# Patient Record
Sex: Female | Born: 1935 | Race: White | Hispanic: No | Smoking: Never smoker
Health system: Southern US, Community
[De-identification: ages and names within clinical notes are randomized; demographics above are authoritative.]

## PROBLEM LIST (undated history)

## (undated) DIAGNOSIS — G43909 Migraine, unspecified, not intractable, without status migrainosus: Secondary | ICD-10-CM

## (undated) DIAGNOSIS — I10 Essential (primary) hypertension: Secondary | ICD-10-CM

## (undated) DIAGNOSIS — J449 Chronic obstructive pulmonary disease, unspecified: Secondary | ICD-10-CM

## (undated) DIAGNOSIS — R296 Repeated falls: Secondary | ICD-10-CM

## (undated) DIAGNOSIS — C801 Malignant (primary) neoplasm, unspecified: Secondary | ICD-10-CM

## (undated) DIAGNOSIS — K5790 Diverticulosis of intestine, part unspecified, without perforation or abscess without bleeding: Secondary | ICD-10-CM

## (undated) DIAGNOSIS — Z8673 Personal history of transient ischemic attack (TIA), and cerebral infarction without residual deficits: Secondary | ICD-10-CM

## (undated) DIAGNOSIS — R32 Unspecified urinary incontinence: Secondary | ICD-10-CM

## (undated) DIAGNOSIS — Z8619 Personal history of other infectious and parasitic diseases: Secondary | ICD-10-CM

## (undated) DIAGNOSIS — M1990 Unspecified osteoarthritis, unspecified site: Secondary | ICD-10-CM

## (undated) DIAGNOSIS — K219 Gastro-esophageal reflux disease without esophagitis: Secondary | ICD-10-CM

## (undated) DIAGNOSIS — K635 Polyp of colon: Secondary | ICD-10-CM

## (undated) DIAGNOSIS — E039 Hypothyroidism, unspecified: Secondary | ICD-10-CM

## (undated) DIAGNOSIS — H409 Unspecified glaucoma: Secondary | ICD-10-CM

## (undated) DIAGNOSIS — G629 Polyneuropathy, unspecified: Secondary | ICD-10-CM

## (undated) DIAGNOSIS — E7800 Pure hypercholesterolemia, unspecified: Secondary | ICD-10-CM

## (undated) HISTORY — DX: Migraine, unspecified, not intractable, without status migrainosus: G43.909

## (undated) HISTORY — DX: Essential (primary) hypertension: I10

## (undated) HISTORY — DX: Hypothyroidism, unspecified: E03.9

## (undated) HISTORY — DX: Diverticulosis of intestine, part unspecified, without perforation or abscess without bleeding: K57.90

## (undated) HISTORY — DX: Chronic obstructive pulmonary disease, unspecified: J44.9

## (undated) HISTORY — PX: APPENDECTOMY: SHX54

## (undated) HISTORY — DX: Unspecified urinary incontinence: R32

## (undated) HISTORY — DX: Polyp of colon: K63.5

## (undated) HISTORY — DX: Pure hypercholesterolemia, unspecified: E78.00

## (undated) HISTORY — DX: Personal history of other infectious and parasitic diseases: Z86.19

## (undated) HISTORY — DX: Gastro-esophageal reflux disease without esophagitis: K21.9

## (undated) HISTORY — DX: Unspecified osteoarthritis, unspecified site: M19.90

## (undated) HISTORY — DX: Unspecified glaucoma: H40.9

## (undated) HISTORY — DX: Polyneuropathy, unspecified: G62.9

## (undated) HISTORY — PX: CHOLECYSTECTOMY: SHX55

## (undated) HISTORY — PX: TONSILLECTOMY: SUR1361

---

## 1959-12-29 HISTORY — PX: ABDOMINAL HYSTERECTOMY: SHX81

## 1974-12-28 HISTORY — PX: OTHER SURGICAL HISTORY: SHX169

## 1995-12-29 HISTORY — PX: CATARACT EXTRACTION: SUR2

## 2005-01-19 ENCOUNTER — Ambulatory Visit: Payer: Self-pay | Admitting: Internal Medicine

## 2005-06-16 ENCOUNTER — Ambulatory Visit: Payer: Self-pay | Admitting: Internal Medicine

## 2006-07-27 ENCOUNTER — Ambulatory Visit: Payer: Self-pay | Admitting: Internal Medicine

## 2007-06-01 ENCOUNTER — Ambulatory Visit: Payer: Self-pay | Admitting: Gastroenterology

## 2007-07-04 ENCOUNTER — Ambulatory Visit: Payer: Self-pay | Admitting: Internal Medicine

## 2008-04-24 ENCOUNTER — Ambulatory Visit: Payer: Self-pay | Admitting: Internal Medicine

## 2008-07-05 ENCOUNTER — Ambulatory Visit: Payer: Self-pay | Admitting: Internal Medicine

## 2009-02-28 ENCOUNTER — Ambulatory Visit: Payer: Self-pay | Admitting: Gastroenterology

## 2009-07-10 ENCOUNTER — Ambulatory Visit: Payer: Self-pay | Admitting: Internal Medicine

## 2009-11-05 ENCOUNTER — Ambulatory Visit: Payer: Self-pay | Admitting: Internal Medicine

## 2009-12-23 ENCOUNTER — Ambulatory Visit: Payer: Self-pay | Admitting: Surgery

## 2009-12-26 ENCOUNTER — Ambulatory Visit: Payer: Self-pay | Admitting: Surgery

## 2009-12-28 HISTORY — PX: INTERSTIM IMPLANT PLACEMENT: SHX5130

## 2010-03-11 ENCOUNTER — Ambulatory Visit: Payer: Self-pay | Admitting: Urology

## 2010-05-10 ENCOUNTER — Ambulatory Visit: Payer: Self-pay | Admitting: Internal Medicine

## 2010-05-27 ENCOUNTER — Ambulatory Visit: Payer: Self-pay | Admitting: Urology

## 2010-06-05 ENCOUNTER — Ambulatory Visit: Payer: Self-pay | Admitting: Gastroenterology

## 2010-06-10 ENCOUNTER — Ambulatory Visit: Payer: Self-pay | Admitting: Urology

## 2010-06-30 ENCOUNTER — Ambulatory Visit: Payer: Self-pay | Admitting: Internal Medicine

## 2010-07-15 ENCOUNTER — Ambulatory Visit: Payer: Self-pay | Admitting: Urology

## 2010-08-27 ENCOUNTER — Ambulatory Visit: Payer: Self-pay | Admitting: Internal Medicine

## 2011-01-07 ENCOUNTER — Ambulatory Visit: Payer: Self-pay | Admitting: Internal Medicine

## 2011-03-05 ENCOUNTER — Ambulatory Visit: Payer: Self-pay | Admitting: Internal Medicine

## 2011-06-22 ENCOUNTER — Ambulatory Visit: Payer: Self-pay | Admitting: Obstetrics and Gynecology

## 2011-09-03 ENCOUNTER — Ambulatory Visit: Payer: Self-pay | Admitting: Internal Medicine

## 2011-10-21 ENCOUNTER — Ambulatory Visit: Payer: Self-pay | Admitting: Family Medicine

## 2012-01-06 DIAGNOSIS — Z79899 Other long term (current) drug therapy: Secondary | ICD-10-CM | POA: Diagnosis not present

## 2012-01-06 DIAGNOSIS — I10 Essential (primary) hypertension: Secondary | ICD-10-CM | POA: Diagnosis not present

## 2012-01-06 DIAGNOSIS — E039 Hypothyroidism, unspecified: Secondary | ICD-10-CM | POA: Diagnosis not present

## 2012-01-06 DIAGNOSIS — E7800 Pure hypercholesterolemia, unspecified: Secondary | ICD-10-CM | POA: Diagnosis not present

## 2012-01-11 DIAGNOSIS — R7989 Other specified abnormal findings of blood chemistry: Secondary | ICD-10-CM | POA: Diagnosis not present

## 2012-01-20 DIAGNOSIS — N312 Flaccid neuropathic bladder, not elsewhere classified: Secondary | ICD-10-CM | POA: Diagnosis not present

## 2012-01-20 DIAGNOSIS — N3946 Mixed incontinence: Secondary | ICD-10-CM | POA: Diagnosis not present

## 2012-01-20 DIAGNOSIS — N318 Other neuromuscular dysfunction of bladder: Secondary | ICD-10-CM | POA: Diagnosis not present

## 2012-01-20 DIAGNOSIS — R339 Retention of urine, unspecified: Secondary | ICD-10-CM | POA: Diagnosis not present

## 2012-01-22 DIAGNOSIS — I10 Essential (primary) hypertension: Secondary | ICD-10-CM | POA: Diagnosis not present

## 2012-01-22 DIAGNOSIS — R7309 Other abnormal glucose: Secondary | ICD-10-CM | POA: Diagnosis not present

## 2012-01-22 DIAGNOSIS — R5381 Other malaise: Secondary | ICD-10-CM | POA: Diagnosis not present

## 2012-01-22 DIAGNOSIS — E7800 Pure hypercholesterolemia, unspecified: Secondary | ICD-10-CM | POA: Diagnosis not present

## 2012-02-18 DIAGNOSIS — E039 Hypothyroidism, unspecified: Secondary | ICD-10-CM | POA: Diagnosis not present

## 2012-03-04 DIAGNOSIS — R5381 Other malaise: Secondary | ICD-10-CM | POA: Diagnosis not present

## 2012-03-04 DIAGNOSIS — R5383 Other fatigue: Secondary | ICD-10-CM | POA: Diagnosis not present

## 2012-03-17 DIAGNOSIS — E7800 Pure hypercholesterolemia, unspecified: Secondary | ICD-10-CM | POA: Diagnosis not present

## 2012-03-17 DIAGNOSIS — R5381 Other malaise: Secondary | ICD-10-CM | POA: Diagnosis not present

## 2012-03-17 DIAGNOSIS — I10 Essential (primary) hypertension: Secondary | ICD-10-CM | POA: Diagnosis not present

## 2012-03-17 DIAGNOSIS — K219 Gastro-esophageal reflux disease without esophagitis: Secondary | ICD-10-CM | POA: Diagnosis not present

## 2012-05-10 DIAGNOSIS — R7989 Other specified abnormal findings of blood chemistry: Secondary | ICD-10-CM | POA: Diagnosis not present

## 2012-05-10 DIAGNOSIS — I10 Essential (primary) hypertension: Secondary | ICD-10-CM | POA: Diagnosis not present

## 2012-05-10 DIAGNOSIS — E7800 Pure hypercholesterolemia, unspecified: Secondary | ICD-10-CM | POA: Diagnosis not present

## 2012-05-11 DIAGNOSIS — H4010X0 Unspecified open-angle glaucoma, stage unspecified: Secondary | ICD-10-CM | POA: Diagnosis not present

## 2012-05-16 DIAGNOSIS — J301 Allergic rhinitis due to pollen: Secondary | ICD-10-CM | POA: Diagnosis not present

## 2012-05-16 DIAGNOSIS — H903 Sensorineural hearing loss, bilateral: Secondary | ICD-10-CM | POA: Diagnosis not present

## 2012-05-16 DIAGNOSIS — R42 Dizziness and giddiness: Secondary | ICD-10-CM | POA: Diagnosis not present

## 2012-05-16 DIAGNOSIS — H6120 Impacted cerumen, unspecified ear: Secondary | ICD-10-CM | POA: Diagnosis not present

## 2012-05-25 DIAGNOSIS — N289 Disorder of kidney and ureter, unspecified: Secondary | ICD-10-CM | POA: Diagnosis not present

## 2012-06-02 DIAGNOSIS — M76899 Other specified enthesopathies of unspecified lower limb, excluding foot: Secondary | ICD-10-CM | POA: Diagnosis not present

## 2012-06-24 DIAGNOSIS — I10 Essential (primary) hypertension: Secondary | ICD-10-CM | POA: Diagnosis not present

## 2012-06-24 DIAGNOSIS — S20229A Contusion of unspecified back wall of thorax, initial encounter: Secondary | ICD-10-CM | POA: Diagnosis not present

## 2012-06-27 DIAGNOSIS — Z8742 Personal history of other diseases of the female genital tract: Secondary | ICD-10-CM | POA: Diagnosis not present

## 2012-07-13 DIAGNOSIS — N839 Noninflammatory disorder of ovary, fallopian tube and broad ligament, unspecified: Secondary | ICD-10-CM | POA: Diagnosis not present

## 2012-07-13 DIAGNOSIS — K219 Gastro-esophageal reflux disease without esophagitis: Secondary | ICD-10-CM | POA: Diagnosis not present

## 2012-07-13 DIAGNOSIS — E7800 Pure hypercholesterolemia, unspecified: Secondary | ICD-10-CM | POA: Diagnosis not present

## 2012-07-13 DIAGNOSIS — M79609 Pain in unspecified limb: Secondary | ICD-10-CM | POA: Diagnosis not present

## 2012-07-13 DIAGNOSIS — G609 Hereditary and idiopathic neuropathy, unspecified: Secondary | ICD-10-CM | POA: Diagnosis not present

## 2012-07-13 DIAGNOSIS — Z8742 Personal history of other diseases of the female genital tract: Secondary | ICD-10-CM | POA: Diagnosis not present

## 2012-07-13 DIAGNOSIS — N952 Postmenopausal atrophic vaginitis: Secondary | ICD-10-CM | POA: Diagnosis not present

## 2012-07-18 DIAGNOSIS — N312 Flaccid neuropathic bladder, not elsewhere classified: Secondary | ICD-10-CM | POA: Diagnosis not present

## 2012-07-18 DIAGNOSIS — G609 Hereditary and idiopathic neuropathy, unspecified: Secondary | ICD-10-CM | POA: Diagnosis not present

## 2012-07-18 DIAGNOSIS — R339 Retention of urine, unspecified: Secondary | ICD-10-CM | POA: Diagnosis not present

## 2012-07-18 DIAGNOSIS — N318 Other neuromuscular dysfunction of bladder: Secondary | ICD-10-CM | POA: Diagnosis not present

## 2012-09-14 DIAGNOSIS — L819 Disorder of pigmentation, unspecified: Secondary | ICD-10-CM | POA: Diagnosis not present

## 2012-09-14 DIAGNOSIS — L821 Other seborrheic keratosis: Secondary | ICD-10-CM | POA: Diagnosis not present

## 2012-09-14 DIAGNOSIS — D485 Neoplasm of uncertain behavior of skin: Secondary | ICD-10-CM | POA: Diagnosis not present

## 2012-09-14 DIAGNOSIS — D2330 Other benign neoplasm of skin of unspecified part of face: Secondary | ICD-10-CM | POA: Diagnosis not present

## 2012-09-14 DIAGNOSIS — Z23 Encounter for immunization: Secondary | ICD-10-CM | POA: Diagnosis not present

## 2012-09-14 DIAGNOSIS — L570 Actinic keratosis: Secondary | ICD-10-CM | POA: Diagnosis not present

## 2012-10-12 DIAGNOSIS — M545 Low back pain, unspecified: Secondary | ICD-10-CM | POA: Diagnosis not present

## 2012-10-12 DIAGNOSIS — M76899 Other specified enthesopathies of unspecified lower limb, excluding foot: Secondary | ICD-10-CM | POA: Diagnosis not present

## 2012-10-17 DIAGNOSIS — L570 Actinic keratosis: Secondary | ICD-10-CM | POA: Diagnosis not present

## 2012-11-07 DIAGNOSIS — H4010X0 Unspecified open-angle glaucoma, stage unspecified: Secondary | ICD-10-CM | POA: Diagnosis not present

## 2012-11-15 ENCOUNTER — Encounter: Payer: Self-pay | Admitting: Internal Medicine

## 2012-11-15 ENCOUNTER — Ambulatory Visit (INDEPENDENT_AMBULATORY_CARE_PROVIDER_SITE_OTHER): Payer: Medicare Other | Admitting: Internal Medicine

## 2012-11-15 VITALS — BP 160/98 | HR 83 | Temp 98.5°F | Ht 62.0 in | Wt 174.5 lb

## 2012-11-15 DIAGNOSIS — R739 Hyperglycemia, unspecified: Secondary | ICD-10-CM

## 2012-11-15 DIAGNOSIS — G589 Mononeuropathy, unspecified: Secondary | ICD-10-CM

## 2012-11-15 DIAGNOSIS — N83209 Unspecified ovarian cyst, unspecified side: Secondary | ICD-10-CM

## 2012-11-15 DIAGNOSIS — Z139 Encounter for screening, unspecified: Secondary | ICD-10-CM

## 2012-11-15 DIAGNOSIS — F329 Major depressive disorder, single episode, unspecified: Secondary | ICD-10-CM

## 2012-11-15 DIAGNOSIS — F32A Depression, unspecified: Secondary | ICD-10-CM

## 2012-11-15 DIAGNOSIS — E039 Hypothyroidism, unspecified: Secondary | ICD-10-CM | POA: Diagnosis not present

## 2012-11-15 DIAGNOSIS — R7309 Other abnormal glucose: Secondary | ICD-10-CM

## 2012-11-15 DIAGNOSIS — E1169 Type 2 diabetes mellitus with other specified complication: Secondary | ICD-10-CM | POA: Insufficient documentation

## 2012-11-15 DIAGNOSIS — D649 Anemia, unspecified: Secondary | ICD-10-CM

## 2012-11-15 DIAGNOSIS — E119 Type 2 diabetes mellitus without complications: Secondary | ICD-10-CM | POA: Insufficient documentation

## 2012-11-15 DIAGNOSIS — R32 Unspecified urinary incontinence: Secondary | ICD-10-CM

## 2012-11-15 DIAGNOSIS — K219 Gastro-esophageal reflux disease without esophagitis: Secondary | ICD-10-CM

## 2012-11-15 DIAGNOSIS — I10 Essential (primary) hypertension: Secondary | ICD-10-CM

## 2012-11-15 DIAGNOSIS — G629 Polyneuropathy, unspecified: Secondary | ICD-10-CM

## 2012-11-15 DIAGNOSIS — E7800 Pure hypercholesterolemia, unspecified: Secondary | ICD-10-CM | POA: Diagnosis not present

## 2012-11-15 MED ORDER — IRBESARTAN 150 MG PO TABS: 150.0000 mg | ORAL_TABLET | Freq: Every day | ORAL | Status: DC

## 2012-11-15 MED ORDER — COLESEVELAM HCL 625 MG PO TABS: ORAL_TABLET | ORAL | Status: DC

## 2012-11-15 MED ORDER — MEMANTINE HCL 10 MG PO TABS: 10.0000 mg | ORAL_TABLET | Freq: Two times a day (BID) | ORAL | Status: DC

## 2012-11-15 NOTE — Patient Instructions (Addendum)
It was nice seeing you today.  I am glad your bladder is doing better.  Let us know if you need anything.

## 2012-11-27 ENCOUNTER — Encounter: Payer: Self-pay | Admitting: Internal Medicine

## 2012-11-27 DIAGNOSIS — K219 Gastro-esophageal reflux disease without esophagitis: Secondary | ICD-10-CM | POA: Insufficient documentation

## 2012-11-27 DIAGNOSIS — G629 Polyneuropathy, unspecified: Secondary | ICD-10-CM | POA: Insufficient documentation

## 2012-11-27 DIAGNOSIS — R32 Unspecified urinary incontinence: Secondary | ICD-10-CM | POA: Insufficient documentation

## 2012-11-27 DIAGNOSIS — F329 Major depressive disorder, single episode, unspecified: Secondary | ICD-10-CM | POA: Insufficient documentation

## 2012-11-27 DIAGNOSIS — F32A Depression, unspecified: Secondary | ICD-10-CM | POA: Insufficient documentation

## 2012-11-27 DIAGNOSIS — N83209 Unspecified ovarian cyst, unspecified side: Secondary | ICD-10-CM | POA: Insufficient documentation

## 2012-11-27 NOTE — Assessment & Plan Note (Signed)
Low carb diet.  Check met b and a1c.    

## 2012-11-27 NOTE — Progress Notes (Signed)
  Subjective:    Patient ID: Sandra Kaufman, female    DOB: Dec 28, 1932, 78 y.o.   MRN: [id number]  HPI 78 year old female with past history of hypertension, COPD, GERD and hypercholesterolemia who comes in today for a scheduled follow up.  She is taking myrbetriq and states it has really helped her bladder symptoms.  Seeing Dr Achilles Dunk.  Breathing stable.  No chest pain or tightness.  Eating and drinking well.  Saw Marian Sorrow recently.  Had an injection in her hip four weeks ago.  Taking some alleve.  Bowels stable.    Past Medical History  Diagnosis Date  . Hypertension   . Hypercholesterolemia   . Hypothyroidism   . COPD (chronic obstructive pulmonary disease)   . GERD (gastroesophageal reflux disease)     with esophageal stricture requiring dilatation x 2 (12/96 and 10/99)  . Arthritis   . Glaucoma   . Peripheral neuropathy   . Chicken pox   . Diverticulitis   . Chronic headaches   . Migraines   . Colon polyps   . Urine incontinence     Review of Systems Patient denies any headache, lightheadedness or dizziness.  No significant sinus or allergy symptoms.  No chest pain, tightness or palpitations.  No increased shortness of breath, cough or congestion.  No nausea or vomiting.  No abdominal pain or cramping.  No significant bowel change, such as diarrhea, constipation, BRBPR or melana.  Urinary symptoms improved.       Objective:   Physical Exam Filed Vitals:   11/15/12 1500  BP: 160/98  Pulse: 83  Temp: 98.5 F (36.9 C)   Blood pressure recheck:  46/23  78 year old female in no acute distress.   HEENT:  Nares - clear.  OP- without lesions or erythema.  NECK:  Supple, nontender.  No audible bruit.   HEART:  Appears to be regular. LUNGS:  Without crackles or wheezing audible.  Respirations even and unlabored.   RADIAL PULSE:  Equal bilaterally.  ABDOMEN:  Soft, nontender.  No audible abdominal bruit.   EXTREMITIES:  No increased edema to be present.                       Assessment & Plan:  GI.  Colonoscopy 06/05/10 with polyp removed and diverticulosis.  Continue to follow up with GI.  On Dexilant and Zantac.  Upper symptoms controlled.    CARDIOVASCULAR.  Saw Dr Lady Gary.  He ordered stress test and echo.  Stress test 06/09/11 negative for ishcemia.  ECHO 06/09/11 revealed mild LVH, EF 60%, mild TR, mild pulmonary hypertension and mild diastolic dysfunction.  PFTs revealed some obstruction and restriction.  Some decreased diffusion capacity.  Currently stable.  Continues to follow up with Dr Lady Gary.   PULMONARY.  See above.  Saw Dr Meredeth Ide.  Obtain his records.  Currently breathing stable.   MSK.  MRI of the back revealed spinal stenosis and degenerative changes.  Follow.  Saw Marian Sorrow recently for hip pain.  Had hip injection.  Continue to follow up with ortho.   HEALTH MAINTENANCE.  Physical 03/17/12.  colonosocpy as outlined.  Mammogram 09/03/11-Birads II. Due a follow up mammogram.  Schedule.

## 2012-11-27 NOTE — Assessment & Plan Note (Signed)
On thyroid replacement.  Check tsh.  

## 2012-11-27 NOTE — Assessment & Plan Note (Signed)
Sees Dr Achilles Dunk.  The Myrbetriq is working well.  Follow.

## 2012-11-27 NOTE — Assessment & Plan Note (Signed)
On amitriptyline.  Follow.  

## 2012-11-27 NOTE — Assessment & Plan Note (Signed)
On Dexilant and Zantac.  Doing well.  Follow.

## 2012-11-27 NOTE — Assessment & Plan Note (Signed)
Stable.  Off Zoloft.  Follow. Doing better.

## 2012-11-27 NOTE — Assessment & Plan Note (Signed)
Ultrasound revealed an ovarian cyst.  Saw Dr Greggory Keen.  Recommended a one year follow up.  Saw him 07/13/12 and states everything checked out fine.  Has been released.

## 2012-11-27 NOTE — Assessment & Plan Note (Signed)
Low cholesterol diet.  Check lipid panel with next labs.  Continues on Welchol.   

## 2012-11-27 NOTE — Assessment & Plan Note (Signed)
Blood pressure has been under good control.  Same meds.  Have her spot check her pressures.  Follow.  Check metabolic panel.

## 2012-11-29 ENCOUNTER — Telehealth: Payer: Self-pay | Admitting: Internal Medicine

## 2012-11-29 NOTE — Telephone Encounter (Signed)
Pt called in  Wanting to get dr scott to call in her a rx for depression [facility] Pt stated she has taken meds for this in the past.  But didn'tknow name of med

## 2012-11-29 NOTE — Telephone Encounter (Signed)
If having problems with depression and needing to start medication - I need to see and discuss.  Can schedule her for 12/01/12 at 4:15.  thanks

## 2012-12-01 ENCOUNTER — Encounter: Payer: Self-pay | Admitting: Internal Medicine

## 2012-12-01 ENCOUNTER — Ambulatory Visit (INDEPENDENT_AMBULATORY_CARE_PROVIDER_SITE_OTHER): Payer: Medicare Other | Admitting: Internal Medicine

## 2012-12-01 ENCOUNTER — Telehealth: Payer: Self-pay | Admitting: *Deleted

## 2012-12-01 VITALS — BP 140/98 | HR 84 | Temp 98.4°F | Ht 62.0 in | Wt 175.5 lb

## 2012-12-01 DIAGNOSIS — F329 Major depressive disorder, single episode, unspecified: Secondary | ICD-10-CM

## 2012-12-01 DIAGNOSIS — I10 Essential (primary) hypertension: Secondary | ICD-10-CM

## 2012-12-01 DIAGNOSIS — F32A Depression, unspecified: Secondary | ICD-10-CM

## 2012-12-01 DIAGNOSIS — R32 Unspecified urinary incontinence: Secondary | ICD-10-CM | POA: Diagnosis not present

## 2012-12-01 MED ORDER — SERTRALINE HCL 25 MG PO TABS: 25.0000 mg | ORAL_TABLET | Freq: Every day | ORAL | Status: DC

## 2012-12-01 NOTE — Progress Notes (Signed)
  Subjective:    Patient ID: Sandra Kaufman, female    DOB: 09-23-1932, 78 y.o.   MRN: [id number]  HPI 78 year old female with past history of hypertension, COPD, GERD and hypercholesterolemia who comes in today as a work in to discuss restarting Zoloft.  Reports problems with decreased desire to get out and do things.  Lack of interest.  Makes herself get out.  Cries easily.  No suicidal ideations.  Breathing stable.  No chest pain or tightness.  No bowel change.    Past Medical History  Diagnosis Date  . Hypertension   . Hypercholesterolemia   . Hypothyroidism   . COPD (chronic obstructive pulmonary disease)   . GERD (gastroesophageal reflux disease)     with esophageal stricture requiring dilatation x 2 (12/96 and 10/99)  . Arthritis   . Glaucoma   . Peripheral neuropathy   . Chicken pox   . Diverticulitis   . Chronic headaches   . Migraines   . Colon polyps   . Urine incontinence     Review of Systems Patient denies any headache, lightheadedness or dizziness.  No significant sinus or allergy symptoms.  No chest pain, tightness or palpitations.  No increased shortness of breath, cough or congestion.  No nausea or vomiting.  No abdominal pain or cramping.  No significant bowel change, such as diarrhea, constipation, BRBPR or melana.  Urinary symptoms improved.  Increased depression as outlined.  No suicidal ideations.  Has tried zoloft in the past.  Wants to restart.       Objective:   Physical Exam  Filed Vitals:   12/01/12 1611  BP: 140/98  Pulse: 84  Temp: 98.4 F (36.9 C)   Blood pressure recheck:  94/37  78 year old female in no acute distress.   HEENT:  Nares - clear.  OP- without lesions or erythema.  NECK:  Supple, nontender.  No audible bruit.   HEART:  Appears to be regular. LUNGS:  Without crackles or wheezing audible.  Respirations even and unlabored.   RADIAL PULSE:  Equal bilaterally.  ABDOMEN:  Soft, nontender.  No audible abdominal bruit.    EXTREMITIES:  No increased edema to be present.                     Assessment & Plan:  GI.  Colonoscopy 06/05/10 with polyp removed and diverticulosis.  Continue to follow up with GI.  On Dexilant and Zantac.  Upper symptoms controlled.    CARDIOVASCULAR.  Saw Dr Lady Gary.  He ordered stress test and echo.  Stress test 06/09/11 negative for ishcemia.  ECHO 06/09/11 revealed mild LVH, EF 60%, mild TR, mild pulmonary hypertension and mild diastolic dysfunction.  PFTs revealed some obstruction and restriction.  Some decreased diffusion capacity.  Currently stable.  Continues to follow up with Dr Lady Gary.   PULMONARY.  See above.  Saw Dr Meredeth Ide.  Obtain his records.  Currently breathing stable.   MSK.  MRI of the back revealed spinal stenosis and degenerative changes.  Follow.  Saw Marian Sorrow recently for hip pain.  Had hip injection.  Continue to follow up with ortho.   HEALTH MAINTENANCE.  Physical 03/17/12.  Colonosocpy as outlined.  Mammogram 09/03/11-Birads II. Due a follow up mammogram.  Schedule.

## 2012-12-01 NOTE — Patient Instructions (Addendum)
It was nice seeing you today.  I am sorry you have not been feeling well.  I am going to start you on Zoloft 25mg  - one per day.  Let me know if any problems.

## 2012-12-01 NOTE — Telephone Encounter (Signed)
Can you make an appointment for this patient on 01/06/13 around 3:30- 4:00 for office visit with scott. thanks

## 2012-12-02 ENCOUNTER — Other Ambulatory Visit (INDEPENDENT_AMBULATORY_CARE_PROVIDER_SITE_OTHER): Payer: Medicare Other

## 2012-12-02 DIAGNOSIS — R739 Hyperglycemia, unspecified: Secondary | ICD-10-CM

## 2012-12-02 DIAGNOSIS — E039 Hypothyroidism, unspecified: Secondary | ICD-10-CM | POA: Diagnosis not present

## 2012-12-02 DIAGNOSIS — I10 Essential (primary) hypertension: Secondary | ICD-10-CM

## 2012-12-02 DIAGNOSIS — R7309 Other abnormal glucose: Secondary | ICD-10-CM

## 2012-12-02 DIAGNOSIS — D649 Anemia, unspecified: Secondary | ICD-10-CM | POA: Diagnosis not present

## 2012-12-02 DIAGNOSIS — E7800 Pure hypercholesterolemia, unspecified: Secondary | ICD-10-CM | POA: Diagnosis not present

## 2012-12-02 LAB — BASIC METABOLIC PANEL
BUN: 15 mg/dL (ref 6–23)
CO2: 24 mEq/L (ref 19–32)
Calcium: 8.8 mg/dL (ref 8.4–10.5)
Chloride: 106 mEq/L (ref 96–112)
Creatinine, Ser: 1 mg/dL (ref 0.4–1.2)
GFR: 54.4 mL/min — ABNORMAL LOW (ref >60.00)
Glucose, Bld: 103 mg/dL — ABNORMAL HIGH (ref 70–99)
Potassium: 4.4 mEq/L (ref 3.5–5.1)
Sodium: 140 mEq/L (ref 135–145)

## 2012-12-02 LAB — HEPATIC FUNCTION PANEL
ALT: 19 U/L (ref 0–35)
AST: 17 U/L (ref 0–37)
Albumin: 3.8 g/dL (ref 3.5–5.2)
Alkaline Phosphatase: 59 U/L (ref 39–117)
Bilirubin, Direct: 0.1 mg/dL (ref 0.0–0.3)
Total Bilirubin: 1.3 mg/dL — ABNORMAL HIGH (ref 0.3–1.2)
Total Protein: 6.7 g/dL (ref 6.0–8.3)

## 2012-12-02 LAB — LIPID PANEL
Cholesterol: 229 mg/dL — ABNORMAL HIGH (ref 0–200)
HDL: 54.7 mg/dL (ref >39.00)
Total CHOL/HDL Ratio: 4
Triglycerides: 147 mg/dL (ref 0.0–149.0)
VLDL: 29.4 mg/dL (ref 0.0–40.0)

## 2012-12-02 LAB — CBC WITH DIFFERENTIAL/PLATELET
Basophils Absolute: 0 10*3/uL (ref 0.0–0.1)
Basophils Relative: 0.7 % (ref 0.0–3.0)
Eosinophils Absolute: 0.1 10*3/uL (ref 0.0–0.7)
Eosinophils Relative: 3 % (ref 0.0–5.0)
HCT: 38.6 % (ref 36.0–46.0)
Hemoglobin: 12.9 g/dL (ref 12.0–15.0)
Lymphocytes Relative: 25.7 % (ref 12.0–46.0)
Lymphs Abs: 1.3 10*3/uL (ref 0.7–4.0)
MCHC: 33.3 g/dL (ref 30.0–36.0)
MCV: 87.6 fl (ref 78.0–100.0)
Monocytes Absolute: 0.4 10*3/uL (ref 0.1–1.0)
Monocytes Relative: 7.3 % (ref 3.0–12.0)
Neutro Abs: 3.1 10*3/uL (ref 1.4–7.7)
Neutrophils Relative %: 63.3 % (ref 43.0–77.0)
Platelets: 225 10*3/uL (ref 150.0–400.0)
RBC: 4.41 Mil/uL (ref 3.87–5.11)
RDW: 13.6 % (ref 11.5–14.6)
WBC: 4.9 10*3/uL (ref 4.5–10.5)

## 2012-12-02 LAB — LDL CHOLESTEROL, DIRECT: Direct LDL: 153.8 mg/dL

## 2012-12-02 LAB — HEMOGLOBIN A1C: Hgb A1c MFr Bld: 6.2 % (ref 4.6–6.5)

## 2012-12-02 LAB — TSH: TSH: 0.96 u[IU]/mL (ref 0.35–5.50)

## 2012-12-02 NOTE — Telephone Encounter (Signed)
Pt aware of appointment 12/10

## 2012-12-02 NOTE — Telephone Encounter (Signed)
Sandra Kaufman try the day before or the day after. Thank you

## 2012-12-02 NOTE — Telephone Encounter (Signed)
There is no opening that day and time.  Can you give me another day

## 2012-12-03 ENCOUNTER — Telehealth: Payer: Self-pay | Admitting: Internal Medicine

## 2012-12-03 NOTE — Telephone Encounter (Signed)
Pt was schedule for a follow up with me on 12/06/12.  I just saw her on 12/01/12.  Need to cx 12/06/12 follow up and schedule a follow up in 3-4 weeks.  (please give her 30 min or put her at the end of the day).  Thanks.

## 2012-12-04 ENCOUNTER — Encounter: Payer: Self-pay | Admitting: Internal Medicine

## 2012-12-04 NOTE — Assessment & Plan Note (Addendum)
Discussed at length with her today.  See above.  Restart Zoloft 25mg  q day.  Get her back in soon to reassess.  Increase dose as needed.  Follow closely.  I spent more than 25 minutes with the patient and more than 50% of the time was spent in consultation regarding the above.

## 2012-12-04 NOTE — Assessment & Plan Note (Signed)
Seeing Dr Cope.  Medication working.    

## 2012-12-04 NOTE — Assessment & Plan Note (Signed)
Follow blood pressure.  Recheck improved.  Treat the depression.  Follow.  Have her spot check her blood pressure.

## 2012-12-05 NOTE — Progress Notes (Signed)
Called patient and made appointment for two weeks.

## 2012-12-06 ENCOUNTER — Ambulatory Visit: Payer: Medicare Other | Admitting: Internal Medicine

## 2012-12-08 ENCOUNTER — Other Ambulatory Visit: Payer: Self-pay | Admitting: *Deleted

## 2012-12-16 ENCOUNTER — Telehealth: Payer: Self-pay | Admitting: *Deleted

## 2012-12-16 NOTE — Telephone Encounter (Signed)
Pt is coming in for labs Monday (Dec. 23,2013) What labs and dx would you like? Thank you  

## 2012-12-17 ENCOUNTER — Other Ambulatory Visit: Payer: Self-pay | Admitting: Internal Medicine

## 2012-12-17 NOTE — Telephone Encounter (Signed)
Just needs a liver panel checked.  I will place order.

## 2012-12-17 NOTE — Progress Notes (Signed)
Order placed for liver panel.  (dx hyperbilirubinemia)

## 2012-12-19 ENCOUNTER — Other Ambulatory Visit (INDEPENDENT_AMBULATORY_CARE_PROVIDER_SITE_OTHER): Payer: Medicare Other

## 2012-12-19 DIAGNOSIS — R17 Unspecified jaundice: Secondary | ICD-10-CM

## 2012-12-19 LAB — HEPATIC FUNCTION PANEL
ALT: 18 U/L (ref 0–35)
AST: 17 U/L (ref 0–37)
Albumin: 3.8 g/dL (ref 3.5–5.2)
Alkaline Phosphatase: 58 U/L (ref 39–117)
Bilirubin, Direct: 0.1 mg/dL (ref 0.0–0.3)
Total Bilirubin: 0.8 mg/dL (ref 0.3–1.2)
Total Protein: 6.4 g/dL (ref 6.0–8.3)

## 2012-12-29 ENCOUNTER — Other Ambulatory Visit: Payer: Self-pay | Admitting: Internal Medicine

## 2012-12-29 MED ORDER — SERTRALINE HCL 50 MG PO TABS: 50.0000 mg | ORAL_TABLET | Freq: Every day | ORAL | Status: DC

## 2012-12-29 NOTE — Telephone Encounter (Signed)
Notify pt she can increase her zoloft to 50mg  q day.  I sent a rx to her pharmacy for new dose.  Keep follow up appt

## 2012-12-29 NOTE — Telephone Encounter (Signed)
Patient notified

## 2012-12-29 NOTE — Telephone Encounter (Signed)
Pt called stating she only has 1 pill left of sertraline hcl 25mg  Pt stated she would like something stronger she cannot tell any difference  [facility]

## 2012-12-29 NOTE — Telephone Encounter (Signed)
zoloft changed form 25mg  to 50mg  q day.  rx sent in to [facility] (#30 with 2 refills)

## 2012-12-29 NOTE — Telephone Encounter (Signed)
Please Advise

## 2013-01-06 ENCOUNTER — Ambulatory Visit (INDEPENDENT_AMBULATORY_CARE_PROVIDER_SITE_OTHER): Payer: Medicare Other | Admitting: Internal Medicine

## 2013-01-06 ENCOUNTER — Encounter: Payer: Self-pay | Admitting: Internal Medicine

## 2013-01-06 VITALS — BP 128/78 | HR 87 | Temp 99.0°F | Ht 62.0 in | Wt 176.8 lb

## 2013-01-06 DIAGNOSIS — K219 Gastro-esophageal reflux disease without esophagitis: Secondary | ICD-10-CM

## 2013-01-06 DIAGNOSIS — F32A Depression, unspecified: Secondary | ICD-10-CM

## 2013-01-06 DIAGNOSIS — N83209 Unspecified ovarian cyst, unspecified side: Secondary | ICD-10-CM | POA: Diagnosis not present

## 2013-01-06 DIAGNOSIS — I10 Essential (primary) hypertension: Secondary | ICD-10-CM

## 2013-01-06 DIAGNOSIS — F329 Major depressive disorder, single episode, unspecified: Secondary | ICD-10-CM

## 2013-01-08 ENCOUNTER — Encounter: Payer: Self-pay | Admitting: Internal Medicine

## 2013-01-08 NOTE — Assessment & Plan Note (Signed)
Just increased zoloft to 50mg  q day.  Discussed at length with her today.  Will continue current med regimen.  She will call in a few weeks with update.  Schedule a follow up appt soon to reassess.  Declines counseling or psych referral at this time.  Feels she has good support.

## 2013-01-08 NOTE — Assessment & Plan Note (Signed)
Previous ultrasound revealed an ovarian cyst.  Saw Dr DeFrancesco.  Felt things were stable.  Recommended follow up in one year.  Was last evaluated 07/13/12 and was released.    

## 2013-01-08 NOTE — Assessment & Plan Note (Signed)
On Dexilant and zantac.

## 2013-01-08 NOTE — Assessment & Plan Note (Signed)
Blood pressure looks good today.  Follow.

## 2013-01-08 NOTE — Progress Notes (Signed)
Subjective:    Patient ID: Sandra Kaufman, female    DOB: 1932/08/20, 79 y.o.   MRN: [id number]  HPI 79 year old female with past history of hypertension, COPD, GERD and hypercholesterolemia who comes in today for a scheduled follow up.  Has been having some issues with depression.  Started on Zoloft.  States she still does not have as much energy.  Wants to stay at home.  Sits a lot.  Does make herself get out intermittently.  No suicidal ideations.  Just recently increased her Zoloft to 50mg  q day.  May feel some better.  She has had problems with bursitis in her left hip.  Seeing Marian Sorrow.     Past Medical History  Diagnosis Date  . Hypertension   . Hypercholesterolemia   . Hypothyroidism   . COPD (chronic obstructive pulmonary disease)   . GERD (gastroesophageal reflux disease)     with esophageal stricture requiring dilatation x 2 (12/96 and 10/99)  . Arthritis   . Glaucoma   . Peripheral neuropathy   . History of chicken pox   . Diverticulosis   . Migraines   . Colon polyps   . Urinary incontinence     Current Outpatient Prescriptions on File Prior to Visit  Medication Sig Dispense Refill  . amitriptyline (ELAVIL) 10 MG tablet Take 2 q hs      . aspirin 81 MG tablet Take 81 mg by mouth daily.      . bimatoprost (LUMIGAN) 0.03 % ophthalmic solution Place 1 drop into both eyes at bedtime.      . brinzolamide (AZOPT) 1 % ophthalmic suspension Place 1 drop into both eyes 2 (two) times daily.       . colesevelam (WELCHOL) 625 MG tablet Take 3 tablets bid  540 tablet  3  . dexlansoprazole (DEXILANT) 60 MG capsule Take 60 mg by mouth daily.      . fish oil-omega-3 fatty acids 1000 MG capsule 1 g. 2 capsules q day      . fluticasone (FLONASE) 50 MCG/ACT nasal spray Place 2 sprays into the nose daily.      . irbesartan (AVAPRO) 150 MG tablet Take 1 tablet (150 mg total) by mouth daily.  90 tablet  3  . levothyroxine (SYNTHROID) 125 MCG tablet Take 125 mcg by mouth daily.      .  memantine (NAMENDA) 10 MG tablet Take 1 tablet (10 mg total) by mouth 2 (two) times daily.  180 tablet  3  . Mirabegron (MYRBETRIQ PO) Per urology      . psyllium (METAMUCIL) 0.52 G capsule 4 capsules bid      . ranitidine (ZANTAC) 300 MG tablet Take 300 mg by mouth daily.      . sertraline (ZOLOFT) 50 MG tablet Take 1 tablet (50 mg total) by mouth daily.  30 tablet  2    Review of Systems Patient denies any headache, lightheadedness or dizziness.  No sinus or allergy symptoms.  No chest pain, tightness or palpitations.  No increased shortness of breath, cough or congestion.  No nausea or vomiting.  No abdominal pain or cramping.  No bowel change, such as diarrhea, constipation, BRBPR or melana.  No urine change.   Eating and drinking.       Objective:   Physical Exam Filed Vitals:   01/06/13 1136  BP: 128/78  Pulse: 87  Temp: 99 F (19.29 C)   79 year old female in no acute distress.   HEENT:  Nares - clear.  OP- without lesions or erythema.  NECK:  Supple, nontender.  No audible bruit.   HEART:  Appears to be regular. LUNGS:  Without crackles or wheezing audible.  Respirations even and unlabored.   RADIAL PULSE:  Equal bilaterally.  ABDOMEN:  Soft, nontender.  No audible abdominal bruit.   EXTREMITIES:  No increased edema to be present.                   Assessment & Plan:  INCREASED PSYCHOSOCIAL STRESSORS/DEPRESSION.  On Zoloft.  Just increased dose to 50mg  q day (one week ago).  Continue on same dose for now.  Call with update over the next few weeks.  Schedule a follow up appt soon to reassess.  Notify me if any change or problems.  Encourage increased activity/exercise.    MSK.  Bursitis - left hip.  Seeing Marian Sorrow.  Previous MRI - spinal stenosis and degenerative changes.   CARDIOVASCULAR.  Has seen Dr Lady Gary.  Stress test 06/09/11 negative for ischemia.  ECHO 06/09/11 revealed mild LVH, EF 60%, mild TR, mild pulmonary hypertension and mild diastolic dysfunction.  He ordered  PFTs.  Revealed some obstruction and restriction with decreased diffusion capacity.  Currently stable.    PULMONARY.  See above for PFTs.  Saw Dr Meredeth Ide.  Obtain his records.    HEALTH MAINTENANCE.  Physical 03/17/12.  Colonoscopy 06/05/10 with polyp removed and diverticulosis.  Mammogram was ordered previous visit.

## 2013-01-20 ENCOUNTER — Encounter: Payer: Self-pay | Admitting: Internal Medicine

## 2013-01-20 MED ORDER — AMITRIPTYLINE HCL 10 MG PO TABS: ORAL_TABLET | ORAL | Status: DC

## 2013-01-20 NOTE — Telephone Encounter (Signed)
rx sent to pharmacy

## 2013-01-27 ENCOUNTER — Encounter: Payer: Self-pay | Admitting: Internal Medicine

## 2013-01-31 ENCOUNTER — Telehealth: Payer: Self-pay | Admitting: Internal Medicine

## 2013-01-31 NOTE — Telephone Encounter (Signed)
Please make note for future refills, pt request to have 90 day supply.  I don't think she needs any refills now.  Just wanted this noted.

## 2013-02-07 ENCOUNTER — Other Ambulatory Visit: Payer: Self-pay | Admitting: *Deleted

## 2013-02-07 ENCOUNTER — Encounter: Payer: Self-pay | Admitting: Internal Medicine

## 2013-02-07 MED ORDER — LEVOTHYROXINE SODIUM 125 MCG PO TABS: 125.0000 ug | ORAL_TABLET | Freq: Every day | ORAL | Status: DC

## 2013-02-07 NOTE — Telephone Encounter (Signed)
Sent in to pharmacy.  

## 2013-02-11 ENCOUNTER — Other Ambulatory Visit: Payer: Self-pay

## 2013-02-28 ENCOUNTER — Encounter: Payer: Self-pay | Admitting: Internal Medicine

## 2013-02-28 ENCOUNTER — Ambulatory Visit (INDEPENDENT_AMBULATORY_CARE_PROVIDER_SITE_OTHER): Payer: Medicare Other | Admitting: Internal Medicine

## 2013-02-28 VITALS — BP 154/94 | HR 83 | Temp 98.3°F | Ht 62.0 in | Wt 179.2 lb

## 2013-02-28 DIAGNOSIS — F32A Depression, unspecified: Secondary | ICD-10-CM

## 2013-02-28 DIAGNOSIS — I10 Essential (primary) hypertension: Secondary | ICD-10-CM

## 2013-02-28 DIAGNOSIS — F329 Major depressive disorder, single episode, unspecified: Secondary | ICD-10-CM | POA: Diagnosis not present

## 2013-02-28 DIAGNOSIS — E039 Hypothyroidism, unspecified: Secondary | ICD-10-CM

## 2013-02-28 DIAGNOSIS — K219 Gastro-esophageal reflux disease without esophagitis: Secondary | ICD-10-CM | POA: Diagnosis not present

## 2013-02-28 DIAGNOSIS — R7309 Other abnormal glucose: Secondary | ICD-10-CM

## 2013-02-28 DIAGNOSIS — R739 Hyperglycemia, unspecified: Secondary | ICD-10-CM

## 2013-02-28 DIAGNOSIS — E7800 Pure hypercholesterolemia, unspecified: Secondary | ICD-10-CM

## 2013-02-28 MED ORDER — SERTRALINE HCL 50 MG PO TABS: ORAL_TABLET | ORAL | Status: DC

## 2013-03-01 ENCOUNTER — Encounter: Payer: Self-pay | Admitting: Internal Medicine

## 2013-03-04 ENCOUNTER — Encounter: Payer: Self-pay | Admitting: Internal Medicine

## 2013-03-04 NOTE — Assessment & Plan Note (Signed)
On Dexilant and zantac.      

## 2013-03-04 NOTE — Assessment & Plan Note (Signed)
Follow blood pressure.  Recheck improved.  Treat the depression.  Follow.  Have her spot check her blood pressure.   

## 2013-03-04 NOTE — Progress Notes (Signed)
Subjective:    Patient ID: Sandra Kaufman, female    DOB: 1932/02/22, 79 y.o.   MRN: [id number]  HPI 79 year old female with past history of hypertension, COPD, GERD and hypercholesterolemia who comes in today for a scheduled follow up.  Has been having some issues with depression.  Started on Zoloft.  States she still does not have as much energy as she would like, but has improved.  Feels better.  No suicidal ideations.  On Zoloft 50mg  q day now.  Eating and drinking.  No increased acid reflux.  States she will occasionally have some urgency with her bowel movements.  After she goes, resolves.  No persistent diarrhea.      Past Medical History  Diagnosis Date  . Hypertension   . Hypercholesterolemia   . Hypothyroidism   . COPD (chronic obstructive pulmonary disease)   . GERD (gastroesophageal reflux disease)     with esophageal stricture requiring dilatation x 2 (12/96 and 10/99)  . Arthritis   . Glaucoma   . Peripheral neuropathy   . History of chicken pox   . Diverticulosis   . Migraines   . Colon polyps   . Urinary incontinence     Current Outpatient Prescriptions on File Prior to Visit  Medication Sig Dispense Refill  . amitriptyline (ELAVIL) 10 MG tablet Take 2 q hs  60 tablet  2  . aspirin 81 MG tablet Take 81 mg by mouth daily.      . bimatoprost (LUMIGAN) 0.03 % ophthalmic solution Place 1 drop into both eyes at bedtime.      . brinzolamide (AZOPT) 1 % ophthalmic suspension Place 1 drop into both eyes 2 (two) times daily.       . colesevelam (WELCHOL) 625 MG tablet Take 3 tablets bid  540 tablet  3  . dexlansoprazole (DEXILANT) 60 MG capsule Take 60 mg by mouth daily.      . fish oil-omega-3 fatty acids 1000 MG capsule 1 g. 2 capsules q day      . fluticasone (FLONASE) 50 MCG/ACT nasal spray Place 2 sprays into the nose daily.      . irbesartan (AVAPRO) 150 MG tablet Take 1 tablet (150 mg total) by mouth daily.  90 tablet  3  . levothyroxine (SYNTHROID) 125 MCG tablet  Take 1 tablet (125 mcg total) by mouth daily.  30 tablet  5  . memantine (NAMENDA) 10 MG tablet Take 1 tablet (10 mg total) by mouth 2 (two) times daily.  180 tablet  3  . Mirabegron (MYRBETRIQ PO) Per urology      . psyllium (METAMUCIL) 0.52 G capsule 4 capsules bid      . ranitidine (ZANTAC) 300 MG tablet Take 300 mg by mouth daily.       No current facility-administered medications on file prior to visit.    Review of Systems Patient denies any headache, lightheadedness or dizziness.  No sinus or allergy symptoms.  No chest pain, tightness or palpitations.  No increased shortness of breath, cough or congestion.  No nausea or vomiting.  No abdominal pain or cramping.  No bowel change, such as constipation, BRBPR or melana.   She does report the urgency as outlined.  No urine change.   Eating and drinking.      Objective:   Physical Exam  Filed Vitals:   02/28/13 1428  BP: 154/94  Pulse: 83  Temp: 98.3 F (36.8 C)   Blood pressure recheck:  142/86, pulse 76  79 year old female in no acute distress.   HEENT:  Nares - clear.  OP- without lesions or erythema.  NECK:  Supple, nontender.  No audible bruit.   HEART:  Appears to be regular. LUNGS:  Without crackles or wheezing audible.  Respirations even and unlabored.   RADIAL PULSE:  Equal bilaterally.  ABDOMEN:  Soft, nontender.  No audible abdominal bruit.   EXTREMITIES:  No increased edema to be present.                   Assessment & Plan:  INCREASED PSYCHOSOCIAL STRESSORS/DEPRESSION.  On Zoloft.  Will increase to 75mg  q day.  Schedule a follow up appt soon to reassess.  Notify me if any change or problems.  Encourage increased activity/exercise.  Has improved.    MSK.  Bursitis - left hip.  Seeing Marian Sorrow.  Previous MRI - spinal stenosis and degenerative changes.  URGENCY - BOWELS.  Discussed with her.  Can add fiber to try to bulk stool.  Start align daily.  Follow.     CARDIOVASCULAR.  Has seen Dr Lady Gary.  Stress test  06/09/11 negative for ischemia.  ECHO 06/09/11 revealed mild LVH, EF 60%, mild TR, mild pulmonary hypertension and mild diastolic dysfunction.  He ordered PFTs.  Revealed some obstruction and restriction with decreased diffusion capacity.  Currently stable.    PULMONARY.  See above for PFTs.  Saw Dr Meredeth Ide.  Obtain his records.    HEALTH MAINTENANCE.  Physical 03/17/12.  Colonoscopy 06/05/10 with polyp removed and diverticulosis.  Mammogram was ordered previous visit.

## 2013-03-04 NOTE — Assessment & Plan Note (Signed)
Doing better.  Will increase to 75mg  q day.  Schedule a follow up appt soon to reassess.  Declines counseling or psych referral at this time.  Feels she has good support.  Follow.

## 2013-03-04 NOTE — Assessment & Plan Note (Signed)
On thyroid replacement.  Follow tsh.  

## 2013-03-04 NOTE — Assessment & Plan Note (Signed)
Low cholesterol diet.  Check lipid panel with next labs.  Continues on Welchol.   

## 2013-03-04 NOTE — Assessment & Plan Note (Signed)
Low carb diet.  Follow met b and a1c.   

## 2013-03-14 ENCOUNTER — Telehealth: Payer: Self-pay | Admitting: Internal Medicine

## 2013-03-14 NOTE — Telephone Encounter (Signed)
Reviewed.  Refill sent in on 03/04/13

## 2013-03-17 ENCOUNTER — Encounter: Payer: Self-pay | Admitting: Internal Medicine

## 2013-03-17 ENCOUNTER — Ambulatory Visit (INDEPENDENT_AMBULATORY_CARE_PROVIDER_SITE_OTHER): Payer: Medicare Other | Admitting: Internal Medicine

## 2013-03-17 VITALS — BP 130/84 | HR 82 | Temp 98.5°F | Ht 62.0 in | Wt 176.8 lb

## 2013-03-17 DIAGNOSIS — R32 Unspecified urinary incontinence: Secondary | ICD-10-CM

## 2013-03-17 DIAGNOSIS — R7309 Other abnormal glucose: Secondary | ICD-10-CM | POA: Diagnosis not present

## 2013-03-17 DIAGNOSIS — E039 Hypothyroidism, unspecified: Secondary | ICD-10-CM

## 2013-03-17 DIAGNOSIS — E7800 Pure hypercholesterolemia, unspecified: Secondary | ICD-10-CM

## 2013-03-17 DIAGNOSIS — F329 Major depressive disorder, single episode, unspecified: Secondary | ICD-10-CM

## 2013-03-17 DIAGNOSIS — Z1239 Encounter for other screening for malignant neoplasm of breast: Secondary | ICD-10-CM | POA: Diagnosis not present

## 2013-03-17 DIAGNOSIS — I10 Essential (primary) hypertension: Secondary | ICD-10-CM | POA: Diagnosis not present

## 2013-03-17 DIAGNOSIS — G589 Mononeuropathy, unspecified: Secondary | ICD-10-CM

## 2013-03-17 DIAGNOSIS — G629 Polyneuropathy, unspecified: Secondary | ICD-10-CM

## 2013-03-17 DIAGNOSIS — K219 Gastro-esophageal reflux disease without esophagitis: Secondary | ICD-10-CM

## 2013-03-17 DIAGNOSIS — R739 Hyperglycemia, unspecified: Secondary | ICD-10-CM

## 2013-03-17 DIAGNOSIS — F32A Depression, unspecified: Secondary | ICD-10-CM

## 2013-03-17 DIAGNOSIS — N83209 Unspecified ovarian cyst, unspecified side: Secondary | ICD-10-CM

## 2013-03-17 LAB — COMPREHENSIVE METABOLIC PANEL
ALT: 16 U/L (ref 0–35)
AST: 17 U/L (ref 0–37)
Albumin: 4.1 g/dL (ref 3.5–5.2)
Alkaline Phosphatase: 59 U/L (ref 39–117)
BUN: 27 mg/dL — ABNORMAL HIGH (ref 6–23)
CO2: 22 mEq/L (ref 19–32)
Calcium: 8.8 mg/dL (ref 8.4–10.5)
Chloride: 104 mEq/L (ref 96–112)
Creatinine, Ser: 1.3 mg/dL — ABNORMAL HIGH (ref 0.4–1.2)
GFR: 41.65 mL/min — ABNORMAL LOW (ref >60.00)
Glucose, Bld: 111 mg/dL — ABNORMAL HIGH (ref 70–99)
Potassium: 4.2 mEq/L (ref 3.5–5.1)
Sodium: 136 mEq/L (ref 135–145)
Total Bilirubin: 0.8 mg/dL (ref 0.3–1.2)
Total Protein: 7 g/dL (ref 6.0–8.3)

## 2013-03-17 LAB — HEMOGLOBIN A1C: Hgb A1c MFr Bld: 6 % (ref 4.6–6.5)

## 2013-03-17 LAB — LIPID PANEL
Cholesterol: 235 mg/dL — ABNORMAL HIGH (ref 0–200)
HDL: 50.7 mg/dL (ref >39.00)
Total CHOL/HDL Ratio: 5
Triglycerides: 150 mg/dL — ABNORMAL HIGH (ref 0.0–149.0)
VLDL: 30 mg/dL (ref 0.0–40.0)

## 2013-03-17 LAB — LDL CHOLESTEROL, DIRECT: Direct LDL: 165.2 mg/dL

## 2013-03-17 MED ORDER — LEVOTHYROXINE SODIUM 125 MCG PO TABS: 125.0000 ug | ORAL_TABLET | Freq: Every day | ORAL | Status: DC

## 2013-03-17 MED ORDER — AMITRIPTYLINE HCL 10 MG PO TABS: ORAL_TABLET | ORAL | Status: DC

## 2013-03-18 ENCOUNTER — Encounter: Payer: Self-pay | Admitting: Internal Medicine

## 2013-03-19 ENCOUNTER — Encounter: Payer: Self-pay | Admitting: Internal Medicine

## 2013-03-19 NOTE — Progress Notes (Signed)
Subjective:    Patient ID: Sandra Kaufman, female    DOB: [birth year], 79 y.o.   MRN: [id number]  HPI 79 year old female with past history of hypertension, COPD, GERD and hypercholesterolemia who comes in today to follow up on these issues as well as for a complete physical exam.  Has been having some issues with depression.  Started on Zoloft.  Dose increased and she seems to be doing better.   Feels better.  No suicidal ideations.  Eating and drinking.  No increased acid reflux.  States she will occasionally have some urgency with her bowel movements.  After she goes, resolves.  No persistent diarrhea.  She started the fiber and the probiotic and this is better.  Breathing stable.     Past Medical History  Diagnosis Date  . Hypertension   . Hypercholesterolemia   . Hypothyroidism   . COPD (chronic obstructive pulmonary disease)   . GERD (gastroesophageal reflux disease)     with esophageal stricture requiring dilatation x 2 (12/96 and 10/99)  . Arthritis   . Glaucoma   . Peripheral neuropathy   . History of chicken pox   . Diverticulosis   . Migraines   . Colon polyps   . Urinary incontinence     Current Outpatient Prescriptions on File Prior to Visit  Medication Sig Dispense Refill  . aspirin 81 MG tablet Take 81 mg by mouth daily.      . bimatoprost (LUMIGAN) 0.03 % ophthalmic solution Place 1 drop into both eyes at bedtime.      . brinzolamide (AZOPT) 1 % ophthalmic suspension Place 1 drop into both eyes 2 (two) times daily.       . colesevelam (WELCHOL) 625 MG tablet Take 3 tablets bid  540 tablet  3  . dexlansoprazole (DEXILANT) 60 MG capsule Take 60 mg by mouth daily.      . fish oil-omega-3 fatty acids 1000 MG capsule 1 g. 2 capsules q day      . fluticasone (FLONASE) 50 MCG/ACT nasal spray Place 2 sprays into the nose daily.      . irbesartan (AVAPRO) 150 MG tablet Take 1 tablet (150 mg total) by mouth daily.  90 tablet  3  . memantine (NAMENDA) 10 MG tablet Take 1 tablet  (10 mg total) by mouth 2 (two) times daily.  180 tablet  3  . Mirabegron (MYRBETRIQ PO) Per urology      . psyllium (METAMUCIL) 0.52 G capsule 4 capsules bid      . ranitidine (ZANTAC) 300 MG tablet Take 300 mg by mouth daily.      . sertraline (ZOLOFT) 50 MG tablet Take 1 1/2 tablet q day  45 tablet  3   No current facility-administered medications on file prior to visit.    Review of Systems Patient denies any headache, lightheadedness or dizziness.  No sinus or allergy symptoms.  No chest pain, tightness or palpitations.  No increased shortness of breath, cough or congestion.  No nausea or vomiting.  No acid reflux.  No abdominal pain or cramping.  No bowel change, such as constipation, BRBPR or melana.   She does have some issues with urgency as outlined.  This is better with fiber and a probiotic.  No urine change.   Eating and drinking.      Objective:   Physical Exam  Filed Vitals:   03/17/13 0842  BP: 130/84  Pulse: 82  Temp: 98.5 F (36.9 C)   79  year old female in no acute distress.   HEENT:  Nares- clear.  Oropharynx - without lesions. NECK:  Supple.  Nontender.  No audible bruit.  HEART:  Appears to be regular. LUNGS:  No crackles or wheezing audible.  Respirations even and unlabored.  RADIAL PULSE:  Equal bilaterally.    BREASTS:  No nipple discharge or nipple retraction present.  Could not appreciate any distinct nodules or axillary adenopathy.  ABDOMEN:  Soft, nontender.  Bowel sounds present and normal.  No audible abdominal bruit.  GU:  Normal external genitalia.  Vaginal vault without lesions.  S/p hysterectomy.  Could not appreciate any adnexal masses or tenderness.   RECTAL:  Heme negative.   EXTREMITIES:  No increased edema present.  DP pulses palpable and equal bilaterally.           Assessment & Plan:  INCREASED PSYCHOSOCIAL STRESSORS/DEPRESSION.  On Zoloft.  Doing better with the increased dose.  Follow.     MSK.  Bursitis - left hip.  Seeing Marian Sorrow.   Previous MRI - spinal stenosis and degenerative changes.  URGENCY - BOWELS.  Using fiber and taking align daily.  This has helped.  Follow.    CARDIOVASCULAR.  Has seen Dr Lady Gary.  Stress test 06/09/11 negative for ischemia.  ECHO 06/09/11 revealed mild LVH, EF 60%, mild TR, mild pulmonary hypertension and mild diastolic dysfunction.  He ordered PFTs.  Revealed some obstruction and restriction with decreased diffusion capacity.  Currently stable.    PULMONARY.  See above for PFTs.  Saw Dr Meredeth Ide.  Obtain his records.    HEALTH MAINTENANCE.  Physical today.  Colonoscopy 06/05/10 with polyp removed and diverticulosis.  Schedule mammogram.

## 2013-03-19 NOTE — Assessment & Plan Note (Signed)
On Dexilant and zantac.

## 2013-03-19 NOTE — Assessment & Plan Note (Signed)
On amitriptyline.  Follow.  

## 2013-03-19 NOTE — Assessment & Plan Note (Signed)
Low carb diet.  Follow met b and a1c.   

## 2013-03-19 NOTE — Assessment & Plan Note (Signed)
On thyroid replacement.  Follow tsh.  

## 2013-03-19 NOTE — Assessment & Plan Note (Signed)
Follow blood pressure.  Same medication regimen.  Follow.  Follow metabolic panel.

## 2013-03-19 NOTE — Assessment & Plan Note (Signed)
Doing better on the increased Zoloft.  Now on 75mg q day.  Follow.    

## 2013-03-19 NOTE — Assessment & Plan Note (Signed)
Seeing Dr Cope.  Medication working.    

## 2013-03-19 NOTE — Assessment & Plan Note (Signed)
Low cholesterol diet.  Check lipid panel with next labs.  Continues on Welchol.   

## 2013-03-19 NOTE — Assessment & Plan Note (Signed)
Previous ultrasound revealed an ovarian cyst.  Saw Dr DeFrancesco.  Felt things were stable.  Recommended follow up in one year.  Was last evaluated 07/13/12 and was released.    

## 2013-03-20 ENCOUNTER — Telehealth: Payer: Self-pay | Admitting: Internal Medicine

## 2013-03-20 ENCOUNTER — Encounter: Payer: Self-pay | Admitting: Internal Medicine

## 2013-03-20 DIAGNOSIS — R198 Other specified symptoms and signs involving the digestive system and abdomen: Secondary | ICD-10-CM

## 2013-03-20 MED ORDER — LEVOTHYROXINE SODIUM 125 MCG PO TABS: 125.0000 ug | ORAL_TABLET | Freq: Every day | ORAL | Status: DC

## 2013-03-20 NOTE — Telephone Encounter (Signed)
Notify pt that I have placed order for GI referral.  Amber will be calling her with an appt.  I am ok with name brand synthroid.  Will need to call pharmacy and clarify - name brand only

## 2013-03-20 NOTE — Telephone Encounter (Signed)
Patient notified. Called [facility] to make sure they give patient name brand. Pharmacist stated that request was received.

## 2013-03-20 NOTE — Telephone Encounter (Signed)
Complete

## 2013-03-20 NOTE — Telephone Encounter (Signed)
Patient stated that she is going to check with the travel agency to find out what she needs to do and then she will get back with [place] about the letter. Patient also wanted to know if you thought that she might need to go to a Gastrologist. Patient stated that she has not had any diarrhea today. Resent prescription requesting name brand synthroid.

## 2013-03-20 NOTE — Telephone Encounter (Signed)
Please call pt and inform her that I just now got her message.  I will be glad to write a letter if she still needs.  Let me know.  She was supposed to leave today for her trip.

## 2013-03-21 ENCOUNTER — Encounter: Payer: Self-pay | Admitting: Internal Medicine

## 2013-03-21 NOTE — Telephone Encounter (Signed)
Letter ready for patient to pick up.

## 2013-03-21 NOTE — Telephone Encounter (Signed)
Faxed letter to patient per her request

## 2013-03-21 NOTE — Telephone Encounter (Signed)
Called patient and left message on voice mail letting her know that her letter is ready.

## 2013-03-23 ENCOUNTER — Telehealth: Payer: Self-pay | Admitting: Internal Medicine

## 2013-03-23 ENCOUNTER — Encounter: Payer: Medicare Other | Admitting: Internal Medicine

## 2013-03-23 NOTE — Telephone Encounter (Signed)
Pt notified of labs via my chart.  She needs a follow up lab appt (non fasting) within the next several days.  Please schedule and call pt with an appt date and time.  Thanks.

## 2013-03-23 NOTE — Telephone Encounter (Signed)
Left message for pt to call office

## 2013-03-24 ENCOUNTER — Telehealth: Payer: Self-pay | Admitting: *Deleted

## 2013-03-24 DIAGNOSIS — N289 Disorder of kidney and ureter, unspecified: Secondary | ICD-10-CM

## 2013-03-24 NOTE — Telephone Encounter (Signed)
Patient has been scheduled for non -fasting labs

## 2013-03-24 NOTE — Telephone Encounter (Signed)
Pt is coming in for labs Monday 03.31.2014 what labs and dx would you like for this pt?  

## 2013-03-25 NOTE — Telephone Encounter (Signed)
She needs a met b.  I placed order for lab.  Thanks.

## 2013-03-27 ENCOUNTER — Other Ambulatory Visit (INDEPENDENT_AMBULATORY_CARE_PROVIDER_SITE_OTHER): Payer: Medicare Other

## 2013-03-27 ENCOUNTER — Telehealth: Payer: Self-pay | Admitting: Internal Medicine

## 2013-03-27 DIAGNOSIS — N289 Disorder of kidney and ureter, unspecified: Secondary | ICD-10-CM | POA: Diagnosis not present

## 2013-03-27 LAB — BASIC METABOLIC PANEL
BUN: 17 mg/dL (ref 6–23)
CO2: 21 mEq/L (ref 19–32)
Calcium: 8.6 mg/dL (ref 8.4–10.5)
Chloride: 104 mEq/L (ref 96–112)
Creatinine, Ser: 1 mg/dL (ref 0.4–1.2)
GFR: 56.22 mL/min — ABNORMAL LOW (ref >60.00)
Glucose, Bld: 132 mg/dL — ABNORMAL HIGH (ref 70–99)
Potassium: 4.1 mEq/L (ref 3.5–5.1)
Sodium: 135 mEq/L (ref 135–145)

## 2013-03-27 NOTE — Telephone Encounter (Signed)
Pt is going to cancel appointment with dr oh  Please schedule her with dr Servando Snare @ Dr Theone [facility]doch office

## 2013-03-28 ENCOUNTER — Telehealth: Payer: Self-pay | Admitting: Internal Medicine

## 2013-03-28 DIAGNOSIS — R197 Diarrhea, unspecified: Secondary | ICD-10-CM

## 2013-03-28 NOTE — Telephone Encounter (Signed)
Notify pt that her renal function has improved - back to her baseline.  Much better.  Continue to remain hydrated.  Also she had wanted me to refer her to Dr Servando Snare.  Notify her that I have placed an order for a referral.  She should be hearing from [facility] with an appt.

## 2013-03-28 NOTE — Telephone Encounter (Signed)
Left message for patient to return call concerning results. 

## 2013-03-29 NOTE — Telephone Encounter (Signed)
Patient returned call and was given results.  

## 2013-04-04 ENCOUNTER — Ambulatory Visit: Payer: Self-pay | Admitting: Internal Medicine

## 2013-04-04 DIAGNOSIS — Z1231 Encounter for screening mammogram for malignant neoplasm of breast: Secondary | ICD-10-CM | POA: Diagnosis not present

## 2013-04-05 ENCOUNTER — Encounter: Payer: Self-pay | Admitting: Internal Medicine

## 2013-04-05 DIAGNOSIS — K5790 Diverticulosis of intestine, part unspecified, without perforation or abscess without bleeding: Secondary | ICD-10-CM

## 2013-04-05 DIAGNOSIS — K219 Gastro-esophageal reflux disease without esophagitis: Secondary | ICD-10-CM

## 2013-04-11 DIAGNOSIS — K219 Gastro-esophageal reflux disease without esophagitis: Secondary | ICD-10-CM | POA: Diagnosis not present

## 2013-04-11 DIAGNOSIS — R197 Diarrhea, unspecified: Secondary | ICD-10-CM | POA: Diagnosis not present

## 2013-04-17 DIAGNOSIS — L570 Actinic keratosis: Secondary | ICD-10-CM | POA: Diagnosis not present

## 2013-04-17 DIAGNOSIS — D239 Other benign neoplasm of skin, unspecified: Secondary | ICD-10-CM | POA: Diagnosis not present

## 2013-04-17 DIAGNOSIS — Z85828 Personal history of other malignant neoplasm of skin: Secondary | ICD-10-CM | POA: Diagnosis not present

## 2013-04-17 DIAGNOSIS — L819 Disorder of pigmentation, unspecified: Secondary | ICD-10-CM | POA: Diagnosis not present

## 2013-04-17 DIAGNOSIS — Z1283 Encounter for screening for malignant neoplasm of skin: Secondary | ICD-10-CM | POA: Diagnosis not present

## 2013-04-19 ENCOUNTER — Encounter: Payer: Self-pay | Admitting: Internal Medicine

## 2013-04-22 ENCOUNTER — Other Ambulatory Visit: Payer: Self-pay | Admitting: Internal Medicine

## 2013-04-25 ENCOUNTER — Ambulatory Visit: Payer: Self-pay | Admitting: Gastroenterology

## 2013-04-25 DIAGNOSIS — H409 Unspecified glaucoma: Secondary | ICD-10-CM | POA: Diagnosis not present

## 2013-04-25 DIAGNOSIS — Z888 Allergy status to other drugs, medicaments and biological substances status: Secondary | ICD-10-CM | POA: Diagnosis not present

## 2013-04-25 DIAGNOSIS — E785 Hyperlipidemia, unspecified: Secondary | ICD-10-CM | POA: Diagnosis not present

## 2013-04-25 DIAGNOSIS — K5732 Diverticulitis of large intestine without perforation or abscess without bleeding: Secondary | ICD-10-CM | POA: Diagnosis not present

## 2013-04-25 DIAGNOSIS — K449 Diaphragmatic hernia without obstruction or gangrene: Secondary | ICD-10-CM | POA: Diagnosis not present

## 2013-04-25 DIAGNOSIS — R197 Diarrhea, unspecified: Secondary | ICD-10-CM | POA: Diagnosis not present

## 2013-04-25 DIAGNOSIS — K5289 Other specified noninfective gastroenteritis and colitis: Secondary | ICD-10-CM | POA: Diagnosis not present

## 2013-04-25 DIAGNOSIS — J449 Chronic obstructive pulmonary disease, unspecified: Secondary | ICD-10-CM | POA: Diagnosis not present

## 2013-04-25 DIAGNOSIS — I10 Essential (primary) hypertension: Secondary | ICD-10-CM | POA: Diagnosis not present

## 2013-04-25 DIAGNOSIS — Z7982 Long term (current) use of aspirin: Secondary | ICD-10-CM | POA: Diagnosis not present

## 2013-04-25 DIAGNOSIS — E039 Hypothyroidism, unspecified: Secondary | ICD-10-CM | POA: Diagnosis not present

## 2013-04-25 DIAGNOSIS — M1990 Unspecified osteoarthritis, unspecified site: Secondary | ICD-10-CM | POA: Diagnosis not present

## 2013-04-25 DIAGNOSIS — Z79899 Other long term (current) drug therapy: Secondary | ICD-10-CM | POA: Diagnosis not present

## 2013-04-25 DIAGNOSIS — K219 Gastro-esophageal reflux disease without esophagitis: Secondary | ICD-10-CM | POA: Diagnosis not present

## 2013-04-25 DIAGNOSIS — G609 Hereditary and idiopathic neuropathy, unspecified: Secondary | ICD-10-CM | POA: Diagnosis not present

## 2013-04-25 DIAGNOSIS — K2990 Gastroduodenitis, unspecified, without bleeding: Secondary | ICD-10-CM | POA: Diagnosis not present

## 2013-04-25 DIAGNOSIS — K648 Other hemorrhoids: Secondary | ICD-10-CM | POA: Diagnosis not present

## 2013-04-25 DIAGNOSIS — K5730 Diverticulosis of large intestine without perforation or abscess without bleeding: Secondary | ICD-10-CM | POA: Diagnosis not present

## 2013-04-25 DIAGNOSIS — K2970 Gastritis, unspecified, without bleeding: Secondary | ICD-10-CM | POA: Diagnosis not present

## 2013-04-25 DIAGNOSIS — Z86010 Personal history of colonic polyps: Secondary | ICD-10-CM | POA: Diagnosis not present

## 2013-04-25 DIAGNOSIS — R12 Heartburn: Secondary | ICD-10-CM | POA: Diagnosis not present

## 2013-04-25 DIAGNOSIS — H9190 Unspecified hearing loss, unspecified ear: Secondary | ICD-10-CM | POA: Diagnosis not present

## 2013-04-26 LAB — PATHOLOGY REPORT

## 2013-04-29 DIAGNOSIS — K5790 Diverticulosis of intestine, part unspecified, without perforation or abscess without bleeding: Secondary | ICD-10-CM | POA: Insufficient documentation

## 2013-05-01 DIAGNOSIS — R197 Diarrhea, unspecified: Secondary | ICD-10-CM | POA: Diagnosis not present

## 2013-05-15 DIAGNOSIS — H40009 Preglaucoma, unspecified, unspecified eye: Secondary | ICD-10-CM | POA: Diagnosis not present

## 2013-05-24 ENCOUNTER — Encounter: Payer: Self-pay | Admitting: Internal Medicine

## 2013-06-16 ENCOUNTER — Other Ambulatory Visit: Payer: Self-pay | Admitting: *Deleted

## 2013-06-19 MED ORDER — AMITRIPTYLINE HCL 10 MG PO TABS: ORAL_TABLET | ORAL | Status: DC

## 2013-07-13 ENCOUNTER — Other Ambulatory Visit: Payer: Self-pay | Admitting: *Deleted

## 2013-07-13 MED ORDER — SERTRALINE HCL 50 MG PO TABS: ORAL_TABLET | ORAL | Status: DC

## 2013-07-17 ENCOUNTER — Ambulatory Visit: Payer: Medicare Other | Admitting: Internal Medicine

## 2013-08-02 ENCOUNTER — Other Ambulatory Visit: Payer: Self-pay

## 2013-08-29 ENCOUNTER — Encounter: Payer: Self-pay | Admitting: Internal Medicine

## 2013-08-29 ENCOUNTER — Ambulatory Visit (INDEPENDENT_AMBULATORY_CARE_PROVIDER_SITE_OTHER): Payer: Medicare Other | Admitting: Internal Medicine

## 2013-08-29 VITALS — BP 130/80 | HR 77 | Temp 98.7°F | Ht 62.0 in | Wt 176.5 lb

## 2013-08-29 DIAGNOSIS — R739 Hyperglycemia, unspecified: Secondary | ICD-10-CM

## 2013-08-29 DIAGNOSIS — R32 Unspecified urinary incontinence: Secondary | ICD-10-CM

## 2013-08-29 DIAGNOSIS — K5730 Diverticulosis of large intestine without perforation or abscess without bleeding: Secondary | ICD-10-CM

## 2013-08-29 DIAGNOSIS — K219 Gastro-esophageal reflux disease without esophagitis: Secondary | ICD-10-CM

## 2013-08-29 DIAGNOSIS — I10 Essential (primary) hypertension: Secondary | ICD-10-CM

## 2013-08-29 DIAGNOSIS — F329 Major depressive disorder, single episode, unspecified: Secondary | ICD-10-CM

## 2013-08-29 DIAGNOSIS — G589 Mononeuropathy, unspecified: Secondary | ICD-10-CM | POA: Diagnosis not present

## 2013-08-29 DIAGNOSIS — K5790 Diverticulosis of intestine, part unspecified, without perforation or abscess without bleeding: Secondary | ICD-10-CM

## 2013-08-29 DIAGNOSIS — G629 Polyneuropathy, unspecified: Secondary | ICD-10-CM

## 2013-08-29 DIAGNOSIS — E039 Hypothyroidism, unspecified: Secondary | ICD-10-CM | POA: Diagnosis not present

## 2013-08-29 DIAGNOSIS — E7800 Pure hypercholesterolemia, unspecified: Secondary | ICD-10-CM

## 2013-08-29 DIAGNOSIS — R7309 Other abnormal glucose: Secondary | ICD-10-CM

## 2013-08-29 DIAGNOSIS — F32A Depression, unspecified: Secondary | ICD-10-CM

## 2013-08-29 DIAGNOSIS — N83209 Unspecified ovarian cyst, unspecified side: Secondary | ICD-10-CM

## 2013-08-29 NOTE — Progress Notes (Signed)
Subjective:    Patient ID: Sandra Kaufman, female    DOB: 1932-02-11, 79 y.o.   MRN: [id number]  HPI 79 year old female with past history of hypertension, COPD, GERD and hypercholesterolemia who comes in today for a scheduled follow up.  Had been having some issues with depression.  Started on Zoloft.  Doing better.  Feels better.   Eating and drinking.   Bowels better.   She started the fiber and the probiotic and this is better.  She had a recent EGD and colonoscopy.  Since her EGD - she has noticed some increased difficulty swallowing.  Occasionally gets choked.  Still with occasional acid reflux.  On Dexilant and Zantac.  Breathing stable.      Past Medical History  Diagnosis Date  . Hypertension   . Hypercholesterolemia   . Hypothyroidism   . COPD (chronic obstructive pulmonary disease)   . GERD (gastroesophageal reflux disease)     with esophageal stricture requiring dilatation x 2 (12/96 and 10/99)  . Arthritis   . Glaucoma   . Peripheral neuropathy   . History of chicken pox   . Diverticulosis   . Migraines   . Colon polyps   . Urinary incontinence     Current Outpatient Prescriptions on File Prior to Visit  Medication Sig Dispense Refill  . amitriptyline (ELAVIL) 10 MG tablet Take 2 q hs  60 tablet  5  . bimatoprost (LUMIGAN) 0.03 % ophthalmic solution Place 1 drop into both eyes at bedtime.      . brinzolamide (AZOPT) 1 % ophthalmic suspension Place 1 drop into both eyes 2 (two) times daily.       . colesevelam (WELCHOL) 625 MG tablet Take 3 tablets bid  540 tablet  3  . dexlansoprazole (DEXILANT) 60 MG capsule Take 60 mg by mouth daily.      . fish oil-omega-3 fatty acids 1000 MG capsule 1 g. 2 capsules q day      . irbesartan (AVAPRO) 150 MG tablet Take 1 tablet (150 mg total) by mouth daily.  90 tablet  3  . levothyroxine (SYNTHROID) 125 MCG tablet Take 1 tablet (125 mcg total) by mouth daily.  90 tablet  1  . memantine (NAMENDA) 10 MG tablet Take 1 tablet (10 mg  total) by mouth 2 (two) times daily.  180 tablet  3  . Mirabegron (MYRBETRIQ PO) Per urology      . psyllium (METAMUCIL) 0.52 G capsule 4 capsules bid      . ranitidine (ZANTAC) 300 MG tablet Take 300 mg by mouth daily.      . sertraline (ZOLOFT) 50 MG tablet Take 1 1/2 tablet q day  45 tablet  2   No current facility-administered medications on file prior to visit.    Review of Systems Patient denies any headache, lightheadedness or dizziness.  No sinus or allergy symptoms.  No chest pain, tightness or palpitations.  No increased shortness of breath, cough or congestion.  No nausea or vomiting.  Acid reflux as outlined.  Some difficulty swallowing and choking as outlined.  No abdominal pain or cramping.  No bowel change, such as constipation, BRBPR or melana.   Bowels  better with fiber and a probiotic.  No urine change.   Eating and drinking.       Objective:   Physical Exam  Filed Vitals:   08/29/13 1611  BP: 130/80  Pulse: 77  Temp: 98.7 F (19.35 C)   79 year old female  in no acute distress.   HEENT:  Nares- clear.  Oropharynx - without lesions. NECK:  Supple.  Nontender.  No audible bruit.  HEART:  Appears to be regular. LUNGS:  No crackles or wheezing audible.  Respirations even and unlabored.  RADIAL PULSE:  Equal bilaterally.  ABDOMEN:  Soft, nontender.  Bowel sounds present and normal.  No audible abdominal bruit.  EXTREMITIES:  No increased edema present.  DP pulses palpable and equal bilaterally.           Assessment & Plan:  INCREASED PSYCHOSOCIAL STRESSORS/DEPRESSION.  On Zoloft.  Doing better with the increased dose.  Follow.     MSK.  Bursitis - left hip.  Seeing Marian Sorrow.  Previous MRI - spinal stenosis and degenerative changes. Stable.    CARDIOVASCULAR.  Has seen Dr Lady Gary.  Stress test 06/09/11 negative for ischemia.  ECHO 06/09/11 revealed mild LVH, EF 60%, mild TR, mild pulmonary hypertension and mild diastolic dysfunction.  He ordered PFTs.  Revealed some  obstruction and restriction with decreased diffusion capacity.  Currently stable.    PULMONARY.  Breathing stable.      HEALTH MAINTENANCE.  Physical 03/17/13.   Colonoscopy 06/05/10 with polyp removed and diverticulosis.  Mammogram 04/04/13 - Birads II.

## 2013-08-30 ENCOUNTER — Encounter: Payer: Self-pay | Admitting: Internal Medicine

## 2013-08-30 NOTE — Assessment & Plan Note (Signed)
Previous ultrasound revealed an ovarian cyst.  Saw Dr DeFrancesco.  Felt things were stable.  Recommended follow up in one year.  Was last evaluated 07/13/12 and was released.    

## 2013-08-30 NOTE — Assessment & Plan Note (Signed)
Blood pressure doing well.  Same medication regimen.  Follow.  Follow metabolic panel.     

## 2013-08-30 NOTE — Assessment & Plan Note (Signed)
Recent EGD as outlined.  Persistent acid reflux - occasional.  Now with some problems swallowing.  Refer back to Dr Servando Snare for evaluation.  Continue dexilant and zantac.

## 2013-08-30 NOTE — Assessment & Plan Note (Signed)
Bowels better on metamucil and probiotic.  Follow.   

## 2013-08-30 NOTE — Assessment & Plan Note (Signed)
On thyroid replacement.  Follow tsh.  

## 2013-08-30 NOTE — Assessment & Plan Note (Signed)
Doing better on the increased Zoloft.  Now on 75mg q day.  Follow.    

## 2013-08-30 NOTE — Assessment & Plan Note (Signed)
Low carb diet.  Follow met b and a1c.   

## 2013-08-30 NOTE — Assessment & Plan Note (Signed)
Seeing Dr Cope.  Medication working.    

## 2013-08-30 NOTE — Assessment & Plan Note (Signed)
Low cholesterol diet.  Check lipid panel with next labs.  Continues on Welchol.   

## 2013-08-30 NOTE — Assessment & Plan Note (Signed)
On amitriptyline.  Follow.  

## 2013-08-31 ENCOUNTER — Other Ambulatory Visit (INDEPENDENT_AMBULATORY_CARE_PROVIDER_SITE_OTHER): Payer: Medicare Other

## 2013-08-31 DIAGNOSIS — I10 Essential (primary) hypertension: Secondary | ICD-10-CM

## 2013-08-31 DIAGNOSIS — R7309 Other abnormal glucose: Secondary | ICD-10-CM | POA: Diagnosis not present

## 2013-08-31 DIAGNOSIS — E7800 Pure hypercholesterolemia, unspecified: Secondary | ICD-10-CM

## 2013-08-31 DIAGNOSIS — R739 Hyperglycemia, unspecified: Secondary | ICD-10-CM

## 2013-08-31 LAB — COMPREHENSIVE METABOLIC PANEL
ALT: 13 U/L (ref 0–35)
AST: 14 U/L (ref 0–37)
Albumin: 3.7 g/dL (ref 3.5–5.2)
Alkaline Phosphatase: 57 U/L (ref 39–117)
BUN: 18 mg/dL (ref 6–23)
CO2: 25 mEq/L (ref 19–32)
Calcium: 8.7 mg/dL (ref 8.4–10.5)
Chloride: 108 mEq/L (ref 96–112)
Creatinine, Ser: 0.9 mg/dL (ref 0.4–1.2)
GFR: 63.34 mL/min (ref >60.00)
Glucose, Bld: 120 mg/dL — ABNORMAL HIGH (ref 70–99)
Potassium: 4.2 mEq/L (ref 3.5–5.1)
Sodium: 138 mEq/L (ref 135–145)
Total Bilirubin: 0.6 mg/dL (ref 0.3–1.2)
Total Protein: 6.4 g/dL (ref 6.0–8.3)

## 2013-08-31 LAB — LDL CHOLESTEROL, DIRECT: Direct LDL: 183.4 mg/dL

## 2013-08-31 LAB — LIPID PANEL
Cholesterol: 245 mg/dL — ABNORMAL HIGH (ref 0–200)
HDL: 52.3 mg/dL (ref >39.00)
Total CHOL/HDL Ratio: 5
Triglycerides: 107 mg/dL (ref 0.0–149.0)
VLDL: 21.4 mg/dL (ref 0.0–40.0)

## 2013-08-31 LAB — HEMOGLOBIN A1C: Hgb A1c MFr Bld: 6.3 % (ref 4.6–6.5)

## 2013-09-01 ENCOUNTER — Encounter: Payer: Self-pay | Admitting: Internal Medicine

## 2013-09-11 ENCOUNTER — Other Ambulatory Visit: Payer: Self-pay | Admitting: *Deleted

## 2013-09-11 MED ORDER — LEVOTHYROXINE SODIUM 125 MCG PO TABS: 125.0000 ug | ORAL_TABLET | Freq: Every day | ORAL | Status: DC

## 2013-09-16 ENCOUNTER — Emergency Department: Payer: Self-pay | Admitting: Emergency Medicine

## 2013-09-16 DIAGNOSIS — Z888 Allergy status to other drugs, medicaments and biological substances status: Secondary | ICD-10-CM | POA: Diagnosis not present

## 2013-09-16 DIAGNOSIS — T18108A Unspecified foreign body in esophagus causing other injury, initial encounter: Secondary | ICD-10-CM | POA: Diagnosis not present

## 2013-09-16 DIAGNOSIS — I10 Essential (primary) hypertension: Secondary | ICD-10-CM | POA: Diagnosis not present

## 2013-09-16 DIAGNOSIS — T17308A Unspecified foreign body in larynx causing other injury, initial encounter: Secondary | ICD-10-CM | POA: Diagnosis not present

## 2013-09-16 DIAGNOSIS — R112 Nausea with vomiting, unspecified: Secondary | ICD-10-CM | POA: Diagnosis not present

## 2013-09-16 DIAGNOSIS — Z79899 Other long term (current) drug therapy: Secondary | ICD-10-CM | POA: Diagnosis not present

## 2013-09-16 DIAGNOSIS — R6889 Other general symptoms and signs: Secondary | ICD-10-CM | POA: Diagnosis not present

## 2013-09-20 ENCOUNTER — Encounter: Payer: Self-pay | Admitting: Internal Medicine

## 2013-09-25 ENCOUNTER — Telehealth: Payer: Self-pay | Admitting: Internal Medicine

## 2013-09-25 DIAGNOSIS — R1310 Dysphagia, unspecified: Secondary | ICD-10-CM

## 2013-09-25 NOTE — Telephone Encounter (Signed)
Order placed for GI referral.   

## 2013-09-26 ENCOUNTER — Telehealth: Payer: Self-pay | Admitting: Internal Medicine

## 2013-09-26 MED ORDER — FLUTICASONE PROPIONATE 50 MCG/ACT NA SUSP: 2.0000 | Freq: Every day | NASAL | Status: DC

## 2013-09-26 NOTE — Telephone Encounter (Signed)
If she is having nasal congestion and drainage - would recommend flonase - 2 sprays each nostril q day.  I would do this in the evening.  Saline nasal flushes - flush nose at least 2-3x/day.  Robitussin or Robitussin DM as directed.  (the DM will help with cough).  If persistent symptoms, needs evaluation.

## 2013-09-26 NOTE — Telephone Encounter (Signed)
Spoke with pt to make her aware of the apt with Dr. Servando Snare. She states she has a cold and has been taking cough meds Is there anything we can recommend she take?

## 2013-09-26 NOTE — Telephone Encounter (Signed)
Pt notified & Flonase sent to pharmacy. Pt to call back if no better in a few days

## 2013-10-05 DIAGNOSIS — R1310 Dysphagia, unspecified: Secondary | ICD-10-CM | POA: Diagnosis not present

## 2013-11-02 ENCOUNTER — Other Ambulatory Visit: Payer: Self-pay

## 2013-11-06 ENCOUNTER — Other Ambulatory Visit: Payer: Self-pay | Admitting: Internal Medicine

## 2013-11-13 ENCOUNTER — Other Ambulatory Visit: Payer: Self-pay | Admitting: Internal Medicine

## 2013-11-13 DIAGNOSIS — H4010X0 Unspecified open-angle glaucoma, stage unspecified: Secondary | ICD-10-CM | POA: Diagnosis not present

## 2013-12-11 ENCOUNTER — Other Ambulatory Visit: Payer: Self-pay | Admitting: Internal Medicine

## 2013-12-22 ENCOUNTER — Other Ambulatory Visit: Payer: Self-pay | Admitting: Internal Medicine

## 2013-12-25 ENCOUNTER — Other Ambulatory Visit: Payer: Self-pay | Admitting: Internal Medicine

## 2014-01-04 ENCOUNTER — Other Ambulatory Visit: Payer: Self-pay | Admitting: Internal Medicine

## 2014-01-04 ENCOUNTER — Ambulatory Visit (INDEPENDENT_AMBULATORY_CARE_PROVIDER_SITE_OTHER): Payer: Medicare Other | Admitting: Internal Medicine

## 2014-01-04 ENCOUNTER — Encounter (INDEPENDENT_AMBULATORY_CARE_PROVIDER_SITE_OTHER): Payer: Self-pay

## 2014-01-04 ENCOUNTER — Encounter: Payer: Self-pay | Admitting: Internal Medicine

## 2014-01-04 VITALS — BP 130/84 | HR 97 | Temp 98.0°F | Resp 12 | Ht 62.0 in | Wt 173.5 lb

## 2014-01-04 DIAGNOSIS — R32 Unspecified urinary incontinence: Secondary | ICD-10-CM

## 2014-01-04 DIAGNOSIS — F3289 Other specified depressive episodes: Secondary | ICD-10-CM

## 2014-01-04 DIAGNOSIS — R109 Unspecified abdominal pain: Secondary | ICD-10-CM | POA: Diagnosis not present

## 2014-01-04 DIAGNOSIS — E7800 Pure hypercholesterolemia, unspecified: Secondary | ICD-10-CM

## 2014-01-04 DIAGNOSIS — I10 Essential (primary) hypertension: Secondary | ICD-10-CM

## 2014-01-04 DIAGNOSIS — E039 Hypothyroidism, unspecified: Secondary | ICD-10-CM | POA: Diagnosis not present

## 2014-01-04 DIAGNOSIS — G629 Polyneuropathy, unspecified: Secondary | ICD-10-CM

## 2014-01-04 DIAGNOSIS — R739 Hyperglycemia, unspecified: Secondary | ICD-10-CM

## 2014-01-04 DIAGNOSIS — K5790 Diverticulosis of intestine, part unspecified, without perforation or abscess without bleeding: Secondary | ICD-10-CM

## 2014-01-04 DIAGNOSIS — K219 Gastro-esophageal reflux disease without esophagitis: Secondary | ICD-10-CM

## 2014-01-04 DIAGNOSIS — F32A Depression, unspecified: Secondary | ICD-10-CM

## 2014-01-04 DIAGNOSIS — N83209 Unspecified ovarian cyst, unspecified side: Secondary | ICD-10-CM

## 2014-01-04 DIAGNOSIS — K5730 Diverticulosis of large intestine without perforation or abscess without bleeding: Secondary | ICD-10-CM

## 2014-01-04 DIAGNOSIS — R7309 Other abnormal glucose: Secondary | ICD-10-CM | POA: Diagnosis not present

## 2014-01-04 DIAGNOSIS — F329 Major depressive disorder, single episode, unspecified: Secondary | ICD-10-CM

## 2014-01-04 DIAGNOSIS — G589 Mononeuropathy, unspecified: Secondary | ICD-10-CM

## 2014-01-04 LAB — URINALYSIS, ROUTINE W REFLEX MICROSCOPIC
Bilirubin Urine: NEGATIVE
Hgb urine dipstick: NEGATIVE
Ketones, ur: NEGATIVE
Leukocytes, UA: NEGATIVE
Nitrite: NEGATIVE
Specific Gravity, Urine: >=1.03 — AB (ref 1.000–1.030)
Total Protein, Urine: NEGATIVE
Urine Glucose: NEGATIVE
Urobilinogen, UA: 0.2 (ref 0.0–1.0)
pH: 5 (ref 5.0–8.0)

## 2014-01-04 LAB — CBC WITH DIFFERENTIAL/PLATELET
Basophils Absolute: 0 10*3/uL (ref 0.0–0.1)
Basophils Relative: 0.5 % (ref 0.0–3.0)
Eosinophils Absolute: 0.1 10*3/uL (ref 0.0–0.7)
Eosinophils Relative: 1.2 % (ref 0.0–5.0)
HCT: 35.9 % — ABNORMAL LOW (ref 36.0–46.0)
Hemoglobin: 12 g/dL (ref 12.0–15.0)
Lymphocytes Relative: 22.2 % (ref 12.0–46.0)
Lymphs Abs: 1.7 10*3/uL (ref 0.7–4.0)
MCHC: 33.3 g/dL (ref 30.0–36.0)
MCV: 80.8 fl (ref 78.0–100.0)
Monocytes Absolute: 0.5 10*3/uL (ref 0.1–1.0)
Monocytes Relative: 6.5 % (ref 3.0–12.0)
Neutro Abs: 5.3 10*3/uL (ref 1.4–7.7)
Neutrophils Relative %: 69.6 % (ref 43.0–77.0)
Platelets: 297 10*3/uL (ref 150.0–400.0)
RBC: 4.45 Mil/uL (ref 3.87–5.11)
RDW: 15.7 % — ABNORMAL HIGH (ref 11.5–14.6)
WBC: 7.6 10*3/uL (ref 4.5–10.5)

## 2014-01-04 LAB — TSH: TSH: 0.14 u[IU]/mL — ABNORMAL LOW (ref 0.35–5.50)

## 2014-01-04 LAB — BASIC METABOLIC PANEL
BUN: 15 mg/dL (ref 6–23)
CO2: 25 mEq/L (ref 19–32)
Calcium: 9.3 mg/dL (ref 8.4–10.5)
Chloride: 103 mEq/L (ref 96–112)
Creatinine, Ser: 1 mg/dL (ref 0.4–1.2)
GFR: 58.79 mL/min — ABNORMAL LOW (ref >60.00)
Glucose, Bld: 104 mg/dL — ABNORMAL HIGH (ref 70–99)
Potassium: 4.5 mEq/L (ref 3.5–5.1)
Sodium: 138 mEq/L (ref 135–145)

## 2014-01-04 LAB — HEMOGLOBIN A1C: Hgb A1c MFr Bld: 6.4 % (ref 4.6–6.5)

## 2014-01-04 LAB — MICROALBUMIN / CREATININE URINE RATIO
Creatinine,U: 141.7 mg/dL
Microalb Creat Ratio: 0.4 mg/g (ref 0.0–30.0)
Microalb, Ur: 0.5 mg/dL (ref 0.0–1.9)

## 2014-01-04 LAB — HEPATIC FUNCTION PANEL
ALT: 13 U/L (ref 0–35)
AST: 15 U/L (ref 0–37)
Albumin: 4 g/dL (ref 3.5–5.2)
Alkaline Phosphatase: 71 U/L (ref 39–117)
Bilirubin, Direct: 0.2 mg/dL (ref 0.0–0.3)
Total Bilirubin: 0.9 mg/dL (ref 0.3–1.2)
Total Protein: 6.8 g/dL (ref 6.0–8.3)

## 2014-01-04 NOTE — Progress Notes (Signed)
Subjective:    Patient ID: Sandra Kaufman, female    DOB: 07-18-1932, 80 y.o.   MRN: [id number]  HPI 80 year old female with past history of hypertension, COPD, GERD and hypercholesterolemia who comes in today for a scheduled follow up.  Had been having some issues with depression.  Started on Zoloft.  Doing better.  Feels better.   Eating and drinking.   Bowels better.   She started the fiber and the probiotic and this is better.  She had a recent EGD and colonoscopy.  Since her EGD - she had reported some increased difficulty swallowing.  Occasionally gets choked.  Was referred to Dr Allen Norris.  On Dexilant.  Helped some.  Desires no further w/up at this time.  No nausea or vomiting.  Breathing stable.       Past Medical History  Diagnosis Date  . Hypertension   . Hypercholesterolemia   . Hypothyroidism   . COPD (chronic obstructive pulmonary disease)   . GERD (gastroesophageal reflux disease)     with esophageal stricture requiring dilatation x 2 (12/96 and 10/99)  . Arthritis   . Glaucoma   . Peripheral neuropathy   . History of chicken pox   . Diverticulosis   . Migraines   . Colon polyps   . Urinary incontinence     Current Outpatient Prescriptions on File Prior to Visit  Medication Sig Dispense Refill  . amitriptyline (ELAVIL) 10 MG tablet Take 2 q hs  60 tablet  5  . bimatoprost (LUMIGAN) 0.03 % ophthalmic solution Place 1 drop into both eyes at bedtime.      . brinzolamide (AZOPT) 1 % ophthalmic suspension Place 1 drop into both eyes 2 (two) times daily.       . colesevelam (WELCHOL) 625 MG tablet Take 3 tablets bid  540 tablet  3  . dexlansoprazole (DEXILANT) 60 MG capsule Take 60 mg by mouth daily.      . fish oil-omega-3 fatty acids 1000 MG capsule 1 g. 2 capsules q day      . fluticasone (FLONASE) 50 MCG/ACT nasal spray PLACE 2 SPRAYS INTO THE NOSE DAILY.  16 g  2  . irbesartan (AVAPRO) 150 MG tablet TAKE 1 TABLET BY MOUTH EVERY DAY  90 tablet  1  . levothyroxine  (SYNTHROID) 125 MCG tablet Take 1 tablet (125 mcg total) by mouth daily.  90 tablet  1  . Mirabegron (MYRBETRIQ PO) Per urology      . NAMENDA 10 MG tablet TAKE 1 TABLET BY MOUTH TWICE A DAY  180 tablet  1  . psyllium (METAMUCIL) 0.52 G capsule 4 capsules bid      . ranitidine (ZANTAC) 300 MG tablet Take 300 mg by mouth daily.      . sertraline (ZOLOFT) 50 MG tablet Take 1 1/2 tablet q day  45 tablet  2   No current facility-administered medications on file prior to visit.    Review of Systems Patient denies any headache, lightheadedness or dizziness.  No sinus or allergy symptoms.  No chest pain, tightness or palpitations.  No increased shortness of breath, cough or congestion.  No nausea or vomiting.  Acid reflux appears to be under reasonable controlled.  Some difficulty swallowing and choking as outlined.  No abdominal pain or cramping.  No bowel change, such as constipation, BRBPR or melana.   Bowels  better with fiber and a probiotic.  No urine change.   Eating and drinking.  Objective:   Physical Exam  Filed Vitals:   01/04/14 1117  BP: 130/84  Pulse: 97  Temp: 98 F (36.7 C)  Resp: 95   80 year old female in no acute distress.   HEENT:  Nares- clear.  Oropharynx - without lesions. NECK:  Supple.  Nontender.  No audible bruit.  HEART:  Appears to be regular. LUNGS:  No crackles or wheezing audible.  Respirations even and unlabored.  RADIAL PULSE:  Equal bilaterally.  ABDOMEN:  Soft, nontender.  Bowel sounds present and normal.  No audible abdominal bruit.  EXTREMITIES:  No increased edema present.  DP pulses palpable and equal bilaterally.           Assessment & Plan:  INCREASED PSYCHOSOCIAL STRESSORS/DEPRESSION.  On Zoloft.  Doing better.  Follow.     MSK.  Bursitis - left hip.  Seeing Smith Robert.  Previous MRI - spinal stenosis and degenerative changes. Stable.    CARDIOVASCULAR.  Has seen Dr Ubaldo Glassing.  Stress test 06/09/11 negative for ischemia.  ECHO 06/09/11 revealed  mild LVH, EF 60%, mild TR, mild pulmonary hypertension and mild diastolic dysfunction.  He ordered PFTs.  Revealed some obstruction and restriction with decreased diffusion capacity.  Currently stable.    PULMONARY.  Breathing stable.      HEALTH MAINTENANCE.  Physical 03/17/13.   Colonoscopy 06/05/10 with polyp removed and diverticulosis.  Mammogram 04/04/13 - Birads II.

## 2014-01-04 NOTE — Progress Notes (Signed)
Pre visit review using our clinic review tool, if applicable. No additional management support is needed unless otherwise documented below in the visit note. 

## 2014-01-04 NOTE — Progress Notes (Signed)
Orders placed for labs

## 2014-01-04 NOTE — Progress Notes (Signed)
Order placed for f/u tsh.  

## 2014-01-06 ENCOUNTER — Other Ambulatory Visit: Payer: Self-pay | Admitting: *Deleted

## 2014-01-06 MED ORDER — LEVOTHYROXINE SODIUM 112 MCG PO TABS: 112.0000 ug | ORAL_TABLET | Freq: Every day | ORAL | Status: DC

## 2014-01-06 NOTE — Telephone Encounter (Signed)
Pt notified of dose change & new Rx sent to pharmacy

## 2014-01-07 ENCOUNTER — Encounter: Payer: Self-pay | Admitting: Internal Medicine

## 2014-01-07 NOTE — Assessment & Plan Note (Signed)
Low cholesterol diet.  Check lipid panel with next labs.  Continues on Welchol.

## 2014-01-07 NOTE — Assessment & Plan Note (Signed)
On thyroid replacement.  Follow tsh.  

## 2014-01-07 NOTE — Assessment & Plan Note (Signed)
Doing better on the increased Zoloft.  Now on 75mg  q day.  Follow.

## 2014-01-07 NOTE — Assessment & Plan Note (Signed)
EGD as outlined.  Now with some problems swallowing.  Saw Dr Allen Norris for evaluation.  Continue dexilant and zantac.  Wants to monitor.  Follow.

## 2014-01-07 NOTE — Assessment & Plan Note (Signed)
Low carb diet.  Follow met b and a1c.   

## 2014-01-07 NOTE — Assessment & Plan Note (Signed)
Seeing Dr Jacqlyn Larsen.  Medication working.

## 2014-01-07 NOTE — Assessment & Plan Note (Signed)
Blood pressure doing well.  Same medication regimen.  Follow.  Follow metabolic panel.     

## 2014-01-07 NOTE — Assessment & Plan Note (Signed)
On amitriptyline.  Follow.  

## 2014-01-07 NOTE — Assessment & Plan Note (Signed)
Bowels better on metamucil and probiotic.  Follow.

## 2014-01-07 NOTE — Assessment & Plan Note (Signed)
Previous ultrasound revealed an ovarian cyst.  Saw Dr Enzo Bi.  Kristeen Miss things were stable.  Recommended follow up in one year.  Was last evaluated 07/13/12 and was released.

## 2014-01-09 ENCOUNTER — Encounter: Payer: Medicare Other | Admitting: Internal Medicine

## 2014-01-21 ENCOUNTER — Other Ambulatory Visit: Payer: Self-pay | Admitting: Internal Medicine

## 2014-02-05 ENCOUNTER — Telehealth: Payer: Self-pay | Admitting: Internal Medicine

## 2014-02-05 ENCOUNTER — Inpatient Hospital Stay: Payer: Self-pay | Admitting: Internal Medicine

## 2014-02-05 ENCOUNTER — Ambulatory Visit: Payer: Medicare Other | Admitting: Internal Medicine

## 2014-02-05 DIAGNOSIS — Z5189 Encounter for other specified aftercare: Secondary | ICD-10-CM | POA: Diagnosis not present

## 2014-02-05 DIAGNOSIS — I369 Nonrheumatic tricuspid valve disorder, unspecified: Secondary | ICD-10-CM | POA: Diagnosis not present

## 2014-02-05 DIAGNOSIS — M6281 Muscle weakness (generalized): Secondary | ICD-10-CM | POA: Diagnosis not present

## 2014-02-05 DIAGNOSIS — I69991 Dysphagia following unspecified cerebrovascular disease: Secondary | ICD-10-CM | POA: Diagnosis not present

## 2014-02-05 DIAGNOSIS — Z950 Presence of cardiac pacemaker: Secondary | ICD-10-CM | POA: Diagnosis not present

## 2014-02-05 DIAGNOSIS — R1312 Dysphagia, oropharyngeal phase: Secondary | ICD-10-CM | POA: Diagnosis not present

## 2014-02-05 DIAGNOSIS — E538 Deficiency of other specified B group vitamins: Secondary | ICD-10-CM | POA: Diagnosis not present

## 2014-02-05 DIAGNOSIS — F0390 Unspecified dementia without behavioral disturbance: Secondary | ICD-10-CM | POA: Diagnosis not present

## 2014-02-05 DIAGNOSIS — I658 Occlusion and stenosis of other precerebral arteries: Secondary | ICD-10-CM | POA: Diagnosis not present

## 2014-02-05 DIAGNOSIS — R5381 Other malaise: Secondary | ICD-10-CM | POA: Diagnosis not present

## 2014-02-05 DIAGNOSIS — I6350 Cerebral infarction due to unspecified occlusion or stenosis of unspecified cerebral artery: Secondary | ICD-10-CM | POA: Diagnosis not present

## 2014-02-05 DIAGNOSIS — E785 Hyperlipidemia, unspecified: Secondary | ICD-10-CM | POA: Diagnosis not present

## 2014-02-05 DIAGNOSIS — I10 Essential (primary) hypertension: Secondary | ICD-10-CM | POA: Diagnosis not present

## 2014-02-05 DIAGNOSIS — Z886 Allergy status to analgesic agent status: Secondary | ICD-10-CM | POA: Diagnosis not present

## 2014-02-05 DIAGNOSIS — R269 Unspecified abnormalities of gait and mobility: Secondary | ICD-10-CM | POA: Diagnosis not present

## 2014-02-05 DIAGNOSIS — R1310 Dysphagia, unspecified: Secondary | ICD-10-CM | POA: Diagnosis not present

## 2014-02-05 DIAGNOSIS — N390 Urinary tract infection, site not specified: Secondary | ICD-10-CM | POA: Diagnosis not present

## 2014-02-05 DIAGNOSIS — R29810 Facial weakness: Secondary | ICD-10-CM | POA: Diagnosis not present

## 2014-02-05 LAB — PROTIME-INR
INR: 1.1
Prothrombin Time: 14.3 secs (ref 11.5–14.7)

## 2014-02-05 LAB — URINALYSIS, COMPLETE
Bilirubin,UR: NEGATIVE
Blood: NEGATIVE
Glucose,UR: NEGATIVE mg/dL (ref 0–75)
Nitrite: POSITIVE
Ph: 5 (ref 4.5–8.0)
Protein: NEGATIVE
RBC,UR: 15 /HPF (ref 0–5)
Specific Gravity: 1.02 (ref 1.003–1.030)
Squamous Epithelial: 3
WBC UR: 145 /HPF (ref 0–5)

## 2014-02-05 LAB — CBC WITH DIFFERENTIAL/PLATELET
Basophil #: 0.1 10*3/uL (ref 0.0–0.1)
Basophil %: 1 %
Eosinophil #: 0.1 10*3/uL (ref 0.0–0.7)
Eosinophil %: 1.3 %
HCT: 36.2 % (ref 35.0–47.0)
HGB: 11.8 g/dL — ABNORMAL LOW (ref 12.0–16.0)
Lymphocyte #: 1.1 10*3/uL (ref 1.0–3.6)
Lymphocyte %: 18.3 %
MCH: 26.5 pg (ref 26.0–34.0)
MCHC: 32.7 g/dL (ref 32.0–36.0)
MCV: 81 fL (ref 80–100)
Monocyte #: 0.4 x10 3/mm (ref 0.2–0.9)
Monocyte %: 7.1 %
Neutrophil #: 4.4 10*3/uL (ref 1.4–6.5)
Neutrophil %: 72.3 %
Platelet: 264 10*3/uL (ref 150–440)
RBC: 4.46 10*6/uL (ref 3.80–5.20)
RDW: 14.7 % — ABNORMAL HIGH (ref 11.5–14.5)
WBC: 6.1 10*3/uL (ref 3.6–11.0)

## 2014-02-05 LAB — COMPREHENSIVE METABOLIC PANEL
Albumin: 3.7 g/dL (ref 3.4–5.0)
Alkaline Phosphatase: 74 U/L
Anion Gap: 6 — ABNORMAL LOW (ref 7–16)
BUN: 10 mg/dL (ref 7–18)
Bilirubin,Total: 0.7 mg/dL (ref 0.2–1.0)
Calcium, Total: 9.4 mg/dL (ref 8.5–10.1)
Chloride: 107 mmol/L (ref 98–107)
Co2: 26 mmol/L (ref 21–32)
Creatinine: 0.98 mg/dL (ref 0.60–1.30)
EGFR (African American): >60
EGFR (Non-African Amer.): 55 — ABNORMAL LOW
Glucose: 106 mg/dL — ABNORMAL HIGH (ref 65–99)
Osmolality: 277 (ref 275–301)
Potassium: 3.8 mmol/L (ref 3.5–5.1)
SGOT(AST): 15 U/L (ref 15–37)
SGPT (ALT): 20 U/L (ref 12–78)
Sodium: 139 mmol/L (ref 136–145)
Total Protein: 7.4 g/dL (ref 6.4–8.2)

## 2014-02-05 LAB — T4, FREE: Free Thyroxine: 1.34 ng/dL (ref 0.76–1.46)

## 2014-02-05 LAB — TSH: Thyroid Stimulating Horm: 0.106 u[IU]/mL — ABNORMAL LOW

## 2014-02-05 LAB — HEMOGLOBIN A1C: Hemoglobin A1C: 6.2 % (ref 4.2–6.3)

## 2014-02-05 LAB — TROPONIN I: Troponin-I: <0.02 ng/mL

## 2014-02-05 NOTE — Telephone Encounter (Signed)
Please call and make sure that she gets to ER now!

## 2014-02-05 NOTE — Telephone Encounter (Signed)
Benjie Karvonen, RN Team lead asked me to contact pt; spoke with pt and she is off balance, eyes drooped,feels confused; no noticeable weakness in arms or legs and no slurred speech. Pt had me speak with pts neighbor who was there with pt and neighbor said pt thought doctors appt today was for her son; pt is still in gown. Advised pt should be taken to ED for eval. Neighbor has already spoken to [place] pts son and he is coming to pts home. Spoke with [name] and he will call pts neighbor about meeting him at [facility] ED. Will take pt to [facility] ED now.

## 2014-02-05 NOTE — Telephone Encounter (Signed)
Returned call to Patient/Sandra Kaufman/Sandra Kaufman.  States "i'm taking my mother to the ED now."  Change in mental status and drooping for left side of face.  In route now.

## 2014-02-05 NOTE — Telephone Encounter (Signed)
Noted  

## 2014-02-05 NOTE — Telephone Encounter (Signed)
Pt son called to verify his mom's appointment to day a[facility].  Sandra Kaufman stated his mom neighbors called him about five minutes ago and told him she thought ms [name] might of had a mini stroke one of her eyes was droopy and she acted confused.  I transferred Sandra Kaufman to triage so he could give them more symptoms

## 2014-02-06 DIAGNOSIS — I369 Nonrheumatic tricuspid valve disorder, unspecified: Secondary | ICD-10-CM

## 2014-02-06 LAB — COMPREHENSIVE METABOLIC PANEL
Albumin: 3.3 g/dL — ABNORMAL LOW (ref 3.4–5.0)
Alkaline Phosphatase: 70 U/L
Anion Gap: 5 — ABNORMAL LOW (ref 7–16)
BUN: 14 mg/dL (ref 7–18)
Bilirubin,Total: 1 mg/dL (ref 0.2–1.0)
Calcium, Total: 8.7 mg/dL (ref 8.5–10.1)
Chloride: 109 mmol/L — ABNORMAL HIGH (ref 98–107)
Co2: 25 mmol/L (ref 21–32)
Creatinine: 0.95 mg/dL (ref 0.60–1.30)
EGFR (African American): >60
EGFR (Non-African Amer.): 57 — ABNORMAL LOW
Glucose: 125 mg/dL — ABNORMAL HIGH (ref 65–99)
Osmolality: 279 (ref 275–301)
Potassium: 3.4 mmol/L — ABNORMAL LOW (ref 3.5–5.1)
SGOT(AST): 15 U/L (ref 15–37)
SGPT (ALT): 16 U/L (ref 12–78)
Sodium: 139 mmol/L (ref 136–145)
Total Protein: 6.5 g/dL (ref 6.4–8.2)

## 2014-02-06 LAB — LIPID PANEL
Cholesterol: 211 mg/dL — ABNORMAL HIGH (ref 0–200)
HDL Cholesterol: 47 mg/dL (ref 40–60)
Ldl Cholesterol, Calc: 134 mg/dL — ABNORMAL HIGH (ref 0–100)
Triglycerides: 148 mg/dL (ref 0–200)
VLDL Cholesterol, Calc: 30 mg/dL (ref 5–40)

## 2014-02-06 LAB — CBC WITH DIFFERENTIAL/PLATELET
Basophil #: 0.1 10*3/uL (ref 0.0–0.1)
Basophil %: 0.9 %
Eosinophil #: 0.1 10*3/uL (ref 0.0–0.7)
Eosinophil %: 1.9 %
HCT: 32.5 % — ABNORMAL LOW (ref 35.0–47.0)
HGB: 10.9 g/dL — ABNORMAL LOW (ref 12.0–16.0)
Lymphocyte #: 1.7 10*3/uL (ref 1.0–3.6)
Lymphocyte %: 26.2 %
MCH: 26.8 pg (ref 26.0–34.0)
MCHC: 33.6 g/dL (ref 32.0–36.0)
MCV: 80 fL (ref 80–100)
Monocyte #: 0.6 x10 3/mm (ref 0.2–0.9)
Monocyte %: 9 %
Neutrophil #: 4 10*3/uL (ref 1.4–6.5)
Neutrophil %: 62 %
Platelet: 241 10*3/uL (ref 150–440)
RBC: 4.07 10*6/uL (ref 3.80–5.20)
RDW: 14.7 % — ABNORMAL HIGH (ref 11.5–14.5)
WBC: 6.4 10*3/uL (ref 3.6–11.0)

## 2014-02-08 ENCOUNTER — Encounter: Payer: Self-pay | Admitting: Internal Medicine

## 2014-02-08 DIAGNOSIS — I6789 Other cerebrovascular disease: Secondary | ICD-10-CM | POA: Diagnosis not present

## 2014-02-08 DIAGNOSIS — M6281 Muscle weakness (generalized): Secondary | ICD-10-CM | POA: Diagnosis not present

## 2014-02-08 DIAGNOSIS — N390 Urinary tract infection, site not specified: Secondary | ICD-10-CM | POA: Diagnosis not present

## 2014-02-08 DIAGNOSIS — R1310 Dysphagia, unspecified: Secondary | ICD-10-CM | POA: Diagnosis not present

## 2014-02-08 DIAGNOSIS — R1312 Dysphagia, oropharyngeal phase: Secondary | ICD-10-CM | POA: Diagnosis not present

## 2014-02-08 DIAGNOSIS — I69991 Dysphagia following unspecified cerebrovascular disease: Secondary | ICD-10-CM | POA: Diagnosis not present

## 2014-02-08 DIAGNOSIS — I6350 Cerebral infarction due to unspecified occlusion or stenosis of unspecified cerebral artery: Secondary | ICD-10-CM | POA: Diagnosis not present

## 2014-02-08 DIAGNOSIS — R269 Unspecified abnormalities of gait and mobility: Secondary | ICD-10-CM | POA: Diagnosis not present

## 2014-02-08 DIAGNOSIS — F0390 Unspecified dementia without behavioral disturbance: Secondary | ICD-10-CM | POA: Diagnosis not present

## 2014-02-08 DIAGNOSIS — E785 Hyperlipidemia, unspecified: Secondary | ICD-10-CM | POA: Diagnosis not present

## 2014-02-08 DIAGNOSIS — Z5189 Encounter for other specified aftercare: Secondary | ICD-10-CM | POA: Diagnosis not present

## 2014-02-08 DIAGNOSIS — E538 Deficiency of other specified B group vitamins: Secondary | ICD-10-CM | POA: Diagnosis not present

## 2014-02-08 DIAGNOSIS — I10 Essential (primary) hypertension: Secondary | ICD-10-CM | POA: Diagnosis not present

## 2014-02-08 LAB — POTASSIUM: Potassium: 3.8 mmol/L (ref 3.5–5.1)

## 2014-02-09 LAB — URINE CULTURE

## 2014-02-10 LAB — URINALYSIS, COMPLETE
Bacteria: NONE SEEN
Bilirubin,UR: NEGATIVE
Blood: NEGATIVE
Glucose,UR: NEGATIVE mg/dL (ref 0–75)
Ketone: NEGATIVE
Leukocyte Esterase: NEGATIVE
Nitrite: NEGATIVE
Ph: 5 (ref 4.5–8.0)
Protein: NEGATIVE
RBC,UR: 1 /HPF (ref 0–5)
Specific Gravity: 1.016 (ref 1.003–1.030)
Squamous Epithelial: <1
WBC UR: 1 /HPF (ref 0–5)

## 2014-02-12 ENCOUNTER — Other Ambulatory Visit: Payer: Medicare Other

## 2014-02-12 LAB — URINE CULTURE

## 2014-02-15 ENCOUNTER — Ambulatory Visit: Payer: Self-pay | Admitting: Internal Medicine

## 2014-02-15 LAB — MAGNESIUM: Magnesium: 1.6 mg/dL — ABNORMAL LOW

## 2014-02-19 DIAGNOSIS — I6789 Other cerebrovascular disease: Secondary | ICD-10-CM | POA: Diagnosis not present

## 2014-02-19 DIAGNOSIS — F0390 Unspecified dementia without behavioral disturbance: Secondary | ICD-10-CM | POA: Diagnosis not present

## 2014-02-19 DIAGNOSIS — N390 Urinary tract infection, site not specified: Secondary | ICD-10-CM | POA: Diagnosis not present

## 2014-02-24 DIAGNOSIS — I10 Essential (primary) hypertension: Secondary | ICD-10-CM | POA: Diagnosis not present

## 2014-02-24 DIAGNOSIS — R269 Unspecified abnormalities of gait and mobility: Secondary | ICD-10-CM | POA: Diagnosis not present

## 2014-02-24 DIAGNOSIS — Z5189 Encounter for other specified aftercare: Secondary | ICD-10-CM | POA: Diagnosis not present

## 2014-02-24 DIAGNOSIS — I69998 Other sequelae following unspecified cerebrovascular disease: Secondary | ICD-10-CM | POA: Diagnosis not present

## 2014-02-25 ENCOUNTER — Encounter: Payer: Self-pay | Admitting: Internal Medicine

## 2014-02-26 ENCOUNTER — Telehealth: Payer: Self-pay | Admitting: *Deleted

## 2014-02-26 ENCOUNTER — Telehealth: Payer: Self-pay | Admitting: Internal Medicine

## 2014-02-26 DIAGNOSIS — I10 Essential (primary) hypertension: Secondary | ICD-10-CM | POA: Diagnosis not present

## 2014-02-26 DIAGNOSIS — Z5189 Encounter for other specified aftercare: Secondary | ICD-10-CM | POA: Diagnosis not present

## 2014-02-26 DIAGNOSIS — I69998 Other sequelae following unspecified cerebrovascular disease: Secondary | ICD-10-CM | POA: Diagnosis not present

## 2014-02-26 DIAGNOSIS — R269 Unspecified abnormalities of gait and mobility: Secondary | ICD-10-CM | POA: Diagnosis not present

## 2014-02-26 NOTE — Telephone Encounter (Signed)
ER records requested.

## 2014-02-26 NOTE — Telephone Encounter (Signed)
Verbal order left on voicemail.  

## 2014-02-26 NOTE — Telephone Encounter (Signed)
The patient's nephew is wanting the patient to be seen by Dr.Scott . She has been in the hospital and rehab after her stroke and her nephew wants her seen to make sure she is taking the right medications.

## 2014-02-26 NOTE — Telephone Encounter (Signed)
See other phone note from [place]

## 2014-02-26 NOTE — Telephone Encounter (Signed)
[facility] for speech therapy referral.  Will need to review records.

## 2014-02-26 NOTE — Telephone Encounter (Signed)
I can see her 03/02/14 at 11:45.  Will be worked in.  May have to wait a few minutes.

## 2014-02-26 NOTE — Telephone Encounter (Signed)
Need records to review.  Is she still in rehab?  Need to know how she is doing to see how quickly needs worked in.   Thanks.

## 2014-02-26 NOTE — Telephone Encounter (Signed)
Sandra Kaufman with [facility] is requesting speech Therapy referral. Pt has been reporting memory issues since her stroke.

## 2014-02-27 DIAGNOSIS — R269 Unspecified abnormalities of gait and mobility: Secondary | ICD-10-CM | POA: Diagnosis not present

## 2014-02-27 DIAGNOSIS — I69998 Other sequelae following unspecified cerebrovascular disease: Secondary | ICD-10-CM | POA: Diagnosis not present

## 2014-02-27 DIAGNOSIS — I10 Essential (primary) hypertension: Secondary | ICD-10-CM | POA: Diagnosis not present

## 2014-02-27 DIAGNOSIS — Z5189 Encounter for other specified aftercare: Secondary | ICD-10-CM | POA: Diagnosis not present

## 2014-02-27 NOTE — Telephone Encounter (Signed)
Spoke with pt she is aware of appointment

## 2014-02-28 DIAGNOSIS — R269 Unspecified abnormalities of gait and mobility: Secondary | ICD-10-CM | POA: Diagnosis not present

## 2014-02-28 DIAGNOSIS — I69998 Other sequelae following unspecified cerebrovascular disease: Secondary | ICD-10-CM | POA: Diagnosis not present

## 2014-02-28 DIAGNOSIS — Z5189 Encounter for other specified aftercare: Secondary | ICD-10-CM | POA: Diagnosis not present

## 2014-02-28 DIAGNOSIS — I10 Essential (primary) hypertension: Secondary | ICD-10-CM | POA: Diagnosis not present

## 2014-03-01 ENCOUNTER — Encounter: Payer: Self-pay | Admitting: *Deleted

## 2014-03-02 ENCOUNTER — Encounter: Payer: Self-pay | Admitting: Internal Medicine

## 2014-03-02 ENCOUNTER — Ambulatory Visit (INDEPENDENT_AMBULATORY_CARE_PROVIDER_SITE_OTHER): Payer: Medicare Other | Admitting: Internal Medicine

## 2014-03-02 VITALS — BP 130/88 | HR 84 | Temp 98.1°F | Resp 16 | Ht 62.0 in | Wt 174.0 lb

## 2014-03-02 DIAGNOSIS — I639 Cerebral infarction, unspecified: Secondary | ICD-10-CM

## 2014-03-02 DIAGNOSIS — F32A Depression, unspecified: Secondary | ICD-10-CM

## 2014-03-02 DIAGNOSIS — K219 Gastro-esophageal reflux disease without esophagitis: Secondary | ICD-10-CM

## 2014-03-02 DIAGNOSIS — I69998 Other sequelae following unspecified cerebrovascular disease: Secondary | ICD-10-CM | POA: Diagnosis not present

## 2014-03-02 DIAGNOSIS — I6350 Cerebral infarction due to unspecified occlusion or stenosis of unspecified cerebral artery: Secondary | ICD-10-CM | POA: Diagnosis not present

## 2014-03-02 DIAGNOSIS — F3289 Other specified depressive episodes: Secondary | ICD-10-CM

## 2014-03-02 DIAGNOSIS — E7800 Pure hypercholesterolemia, unspecified: Secondary | ICD-10-CM | POA: Diagnosis not present

## 2014-03-02 DIAGNOSIS — I10 Essential (primary) hypertension: Secondary | ICD-10-CM

## 2014-03-02 DIAGNOSIS — R7309 Other abnormal glucose: Secondary | ICD-10-CM | POA: Diagnosis not present

## 2014-03-02 DIAGNOSIS — Z5189 Encounter for other specified aftercare: Secondary | ICD-10-CM | POA: Diagnosis not present

## 2014-03-02 DIAGNOSIS — R32 Unspecified urinary incontinence: Secondary | ICD-10-CM

## 2014-03-02 DIAGNOSIS — F329 Major depressive disorder, single episode, unspecified: Secondary | ICD-10-CM

## 2014-03-02 DIAGNOSIS — E039 Hypothyroidism, unspecified: Secondary | ICD-10-CM | POA: Diagnosis not present

## 2014-03-02 DIAGNOSIS — R269 Unspecified abnormalities of gait and mobility: Secondary | ICD-10-CM | POA: Diagnosis not present

## 2014-03-02 DIAGNOSIS — R739 Hyperglycemia, unspecified: Secondary | ICD-10-CM

## 2014-03-02 MED ORDER — SERTRALINE HCL 50 MG PO TABS: ORAL_TABLET | ORAL | Status: DC

## 2014-03-02 MED ORDER — ALBUTEROL SULFATE HFA 108 (90 BASE) MCG/ACT IN AERS: 2.0000 | INHALATION_SPRAY | Freq: Four times a day (QID) | RESPIRATORY_TRACT | Status: DC | PRN

## 2014-03-02 NOTE — Progress Notes (Signed)
Pre visit review using our clinic review tool, if applicable. No additional management support is needed unless otherwise documented below in the visit note. 

## 2014-03-04 ENCOUNTER — Encounter: Payer: Self-pay | Admitting: Internal Medicine

## 2014-03-04 DIAGNOSIS — I639 Cerebral infarction, unspecified: Secondary | ICD-10-CM | POA: Insufficient documentation

## 2014-03-04 NOTE — Assessment & Plan Note (Signed)
Low cholesterol diet.  Check lipid panel with next labs.  On atorvastatin.  Follow.    

## 2014-03-04 NOTE — Assessment & Plan Note (Signed)
Blood pressure doing well.  Same medication regimen.  Follow.  Follow metabolic panel.     

## 2014-03-04 NOTE — Assessment & Plan Note (Signed)
On thyroid replacement.  Follow tsh.  

## 2014-03-04 NOTE — Assessment & Plan Note (Signed)
Low carb diet.  Follow met b and a1c.   

## 2014-03-04 NOTE — Assessment & Plan Note (Signed)
Restart zoloft.   Follow.

## 2014-03-04 NOTE — Assessment & Plan Note (Signed)
Seeing Dr Cope.   

## 2014-03-04 NOTE — Progress Notes (Signed)
Subjective:    Patient ID: Sandra Kaufman, female    DOB: 1932-02-15, 80 y.o.   MRN: [id number]  HPI 80 year old female with past history of hypertension, COPD, GERD and hypercholesterolemia who comes in today for a hospital follow up.  Was admitted 02/05/14 with CVA.  See hospital records for details.  Went to rehab after her discharge.  Now at home.  Has people in and out of her house - checking on her.  She is accompanied by her nephew.  History obtained from both of them.  She is doing better.  States swallowing is better.  Does have some short term memory loss.  Her nephew states this is improving as well.  Breathing stable.  Acid reflux controlled.  No abdominal pain or cramping.  Bowels stable.  She is not on her zoloft now.  Does report increased anxiety and some reactive depression.       Past Medical History  Diagnosis Date  . Hypertension   . Hypercholesterolemia   . Hypothyroidism   . COPD (chronic obstructive pulmonary disease)   . GERD (gastroesophageal reflux disease)     with esophageal stricture requiring dilatation x 2 (12/96 and 10/99)  . Arthritis   . Glaucoma   . Peripheral neuropathy   . History of chicken pox   . Diverticulosis   . Migraines   . Colon polyps   . Urinary incontinence     Current Outpatient Prescriptions on File Prior to Visit  Medication Sig Dispense Refill  . bimatoprost (LUMIGAN) 0.03 % ophthalmic solution Place 1 drop into both eyes at bedtime.      . brinzolamide (AZOPT) 1 % ophthalmic suspension Place 1 drop into both eyes 2 (two) times daily.       Marland Kitchen dexlansoprazole (DEXILANT) 60 MG capsule Take 60 mg by mouth daily.      . irbesartan (AVAPRO) 150 MG tablet TAKE 1 TABLET BY MOUTH EVERY DAY  90 tablet  1  . NAMENDA 10 MG tablet TAKE 1 TABLET BY MOUTH TWICE A DAY  180 tablet  1  . psyllium (METAMUCIL) 0.52 G capsule 4 capsules bid      . ranitidine (ZANTAC) 300 MG tablet Take 300 mg by mouth daily.      . fish oil-omega-3 fatty acids 1000 MG  capsule 1 g. 2 capsules q day      . fluticasone (FLONASE) 50 MCG/ACT nasal spray PLACE 2 SPRAYS INTO THE NOSE DAILY.  16 g  2  . Mirabegron (MYRBETRIQ PO) Per urology       No current facility-administered medications on file prior to visit.    Review of Systems Patient denies any headache, lightheadedness or dizziness.  No sinus or allergy symptoms.  No chest pain, tightness or palpitations.  No increased shortness of breath, cough or congestion.  No nausea or vomiting.  Acid reflux appears to be under reasonable controlled.  No trouble swallowing.   No abdominal pain or cramping.  No bowel change, such as constipation, BRBPR or melana.   No urine change.   Eating and drinking.  Some weakness.  Walking with her cane and walker.  Short term memory loss.       Objective:   Physical Exam  Filed Vitals:   03/02/14 1145  BP: 130/88  Pulse: 84  Temp: 98.1 F (36.7 C)  Resp: 45   80 year old female in no acute distress.   HEENT:  Nares- clear.  Oropharynx - without lesions.  NECK:  Supple.  Nontender.  No audible bruit.  HEART:  Appears to be regular. LUNGS:  No crackles or wheezing audible.  Respirations even and unlabored.  RADIAL PULSE:  Equal bilaterally.  ABDOMEN:  Soft, nontender.  Bowel sounds present and normal.  No audible abdominal bruit.  EXTREMITIES:  No increased edema present.  DP pulses palpable and equal bilaterally.           Assessment & Plan:  INCREASED PSYCHOSOCIAL STRESSORS/DEPRESSION.  Off zoloft.  Will restart given the increased anxiety and depression.  Follow.       MSK.  Bursitis - left hip.  Seeing Smith Robert.  Previous MRI - spinal stenosis and degenerative changes. Stable.    CARDIOVASCULAR.  Has seen Dr Ubaldo Glassing.  Stress test 06/09/11 negative for ischemia.  ECHO 06/09/11 revealed mild LVH, EF 60%, mild TR, mild pulmonary hypertension and mild diastolic dysfunction.  He ordered PFTs.  Revealed some obstruction and restriction with decreased diffusion capacity.   Currently stable.    PULMONARY.  Breathing stable.      HEALTH MAINTENANCE.  Physical 03/17/13.   Colonoscopy 06/05/10 with polyp removed and diverticulosis.  Mammogram 04/04/13 - Birads II.    I spent 40 minutes with the patient and more than 50% of the time was spent in consultation regarding the above.

## 2014-03-04 NOTE — Assessment & Plan Note (Signed)
EGD as outlined.  Saw Dr Allen Norris for evaluation.  Continue dexilant and zantac.   Follow.

## 2014-03-04 NOTE — Assessment & Plan Note (Signed)
Recent admission for CVA.  Short term memory loss.  No swallowing problems.  Continue on daily aspirin.  Refer to neurology for further evaluation.

## 2014-03-05 DIAGNOSIS — I69998 Other sequelae following unspecified cerebrovascular disease: Secondary | ICD-10-CM | POA: Diagnosis not present

## 2014-03-05 DIAGNOSIS — R269 Unspecified abnormalities of gait and mobility: Secondary | ICD-10-CM | POA: Diagnosis not present

## 2014-03-05 DIAGNOSIS — I10 Essential (primary) hypertension: Secondary | ICD-10-CM | POA: Diagnosis not present

## 2014-03-05 DIAGNOSIS — Z5189 Encounter for other specified aftercare: Secondary | ICD-10-CM | POA: Diagnosis not present

## 2014-03-05 NOTE — Telephone Encounter (Signed)
Received a call today from [name] requesting VO after she has completed her eval for speech therapy-Verbal order given for therapy 2 times a week for 4 weeks.

## 2014-03-06 DIAGNOSIS — I69998 Other sequelae following unspecified cerebrovascular disease: Secondary | ICD-10-CM | POA: Diagnosis not present

## 2014-03-06 DIAGNOSIS — I10 Essential (primary) hypertension: Secondary | ICD-10-CM | POA: Diagnosis not present

## 2014-03-06 DIAGNOSIS — R269 Unspecified abnormalities of gait and mobility: Secondary | ICD-10-CM | POA: Diagnosis not present

## 2014-03-06 DIAGNOSIS — Z5189 Encounter for other specified aftercare: Secondary | ICD-10-CM | POA: Diagnosis not present

## 2014-03-07 DIAGNOSIS — I69998 Other sequelae following unspecified cerebrovascular disease: Secondary | ICD-10-CM | POA: Diagnosis not present

## 2014-03-07 DIAGNOSIS — R269 Unspecified abnormalities of gait and mobility: Secondary | ICD-10-CM | POA: Diagnosis not present

## 2014-03-07 DIAGNOSIS — Z5189 Encounter for other specified aftercare: Secondary | ICD-10-CM | POA: Diagnosis not present

## 2014-03-07 DIAGNOSIS — I10 Essential (primary) hypertension: Secondary | ICD-10-CM | POA: Diagnosis not present

## 2014-03-08 DIAGNOSIS — I69998 Other sequelae following unspecified cerebrovascular disease: Secondary | ICD-10-CM | POA: Diagnosis not present

## 2014-03-08 DIAGNOSIS — Z5189 Encounter for other specified aftercare: Secondary | ICD-10-CM | POA: Diagnosis not present

## 2014-03-08 DIAGNOSIS — I10 Essential (primary) hypertension: Secondary | ICD-10-CM | POA: Diagnosis not present

## 2014-03-08 DIAGNOSIS — R269 Unspecified abnormalities of gait and mobility: Secondary | ICD-10-CM | POA: Diagnosis not present

## 2014-03-12 ENCOUNTER — Other Ambulatory Visit: Payer: Self-pay | Admitting: Internal Medicine

## 2014-03-12 DIAGNOSIS — I69998 Other sequelae following unspecified cerebrovascular disease: Secondary | ICD-10-CM | POA: Diagnosis not present

## 2014-03-12 DIAGNOSIS — Z5189 Encounter for other specified aftercare: Secondary | ICD-10-CM | POA: Diagnosis not present

## 2014-03-12 DIAGNOSIS — I10 Essential (primary) hypertension: Secondary | ICD-10-CM | POA: Diagnosis not present

## 2014-03-12 DIAGNOSIS — R269 Unspecified abnormalities of gait and mobility: Secondary | ICD-10-CM | POA: Diagnosis not present

## 2014-03-12 NOTE — Telephone Encounter (Signed)
RX sent as requested 

## 2014-03-13 DIAGNOSIS — Z5189 Encounter for other specified aftercare: Secondary | ICD-10-CM | POA: Diagnosis not present

## 2014-03-13 DIAGNOSIS — I10 Essential (primary) hypertension: Secondary | ICD-10-CM | POA: Diagnosis not present

## 2014-03-13 DIAGNOSIS — I69998 Other sequelae following unspecified cerebrovascular disease: Secondary | ICD-10-CM | POA: Diagnosis not present

## 2014-03-13 DIAGNOSIS — R269 Unspecified abnormalities of gait and mobility: Secondary | ICD-10-CM | POA: Diagnosis not present

## 2014-03-14 DIAGNOSIS — R269 Unspecified abnormalities of gait and mobility: Secondary | ICD-10-CM | POA: Diagnosis not present

## 2014-03-14 DIAGNOSIS — I69998 Other sequelae following unspecified cerebrovascular disease: Secondary | ICD-10-CM | POA: Diagnosis not present

## 2014-03-14 DIAGNOSIS — I10 Essential (primary) hypertension: Secondary | ICD-10-CM | POA: Diagnosis not present

## 2014-03-14 DIAGNOSIS — Z5189 Encounter for other specified aftercare: Secondary | ICD-10-CM | POA: Diagnosis not present

## 2014-03-15 DIAGNOSIS — R269 Unspecified abnormalities of gait and mobility: Secondary | ICD-10-CM | POA: Diagnosis not present

## 2014-03-15 DIAGNOSIS — I10 Essential (primary) hypertension: Secondary | ICD-10-CM | POA: Diagnosis not present

## 2014-03-15 DIAGNOSIS — Z5189 Encounter for other specified aftercare: Secondary | ICD-10-CM | POA: Diagnosis not present

## 2014-03-15 DIAGNOSIS — I69998 Other sequelae following unspecified cerebrovascular disease: Secondary | ICD-10-CM | POA: Diagnosis not present

## 2014-03-19 DIAGNOSIS — Z5189 Encounter for other specified aftercare: Secondary | ICD-10-CM | POA: Diagnosis not present

## 2014-03-19 DIAGNOSIS — R269 Unspecified abnormalities of gait and mobility: Secondary | ICD-10-CM | POA: Diagnosis not present

## 2014-03-19 DIAGNOSIS — I10 Essential (primary) hypertension: Secondary | ICD-10-CM | POA: Diagnosis not present

## 2014-03-19 DIAGNOSIS — I69998 Other sequelae following unspecified cerebrovascular disease: Secondary | ICD-10-CM | POA: Diagnosis not present

## 2014-03-21 ENCOUNTER — Telehealth: Payer: Self-pay | Admitting: Internal Medicine

## 2014-03-21 DIAGNOSIS — R269 Unspecified abnormalities of gait and mobility: Secondary | ICD-10-CM | POA: Diagnosis not present

## 2014-03-21 DIAGNOSIS — I10 Essential (primary) hypertension: Secondary | ICD-10-CM | POA: Diagnosis not present

## 2014-03-21 DIAGNOSIS — I69998 Other sequelae following unspecified cerebrovascular disease: Secondary | ICD-10-CM | POA: Diagnosis not present

## 2014-03-21 DIAGNOSIS — Z5189 Encounter for other specified aftercare: Secondary | ICD-10-CM | POA: Diagnosis not present

## 2014-03-21 NOTE — Telephone Encounter (Signed)
The patient wants to know if she has permission to drive . She is having to ask someone to take her places.

## 2014-03-21 NOTE — Telephone Encounter (Signed)
Pt notified & also aware of appt on 3/30 (CPE)

## 2014-03-21 NOTE — Telephone Encounter (Signed)
No.  She cannot drive - as we discussed.

## 2014-03-22 DIAGNOSIS — I10 Essential (primary) hypertension: Secondary | ICD-10-CM | POA: Diagnosis not present

## 2014-03-22 DIAGNOSIS — E538 Deficiency of other specified B group vitamins: Secondary | ICD-10-CM | POA: Diagnosis not present

## 2014-03-22 DIAGNOSIS — I69992 Facial weakness following unspecified cerebrovascular disease: Secondary | ICD-10-CM | POA: Diagnosis not present

## 2014-03-22 DIAGNOSIS — N3941 Urge incontinence: Secondary | ICD-10-CM | POA: Diagnosis not present

## 2014-03-22 DIAGNOSIS — R269 Unspecified abnormalities of gait and mobility: Secondary | ICD-10-CM | POA: Diagnosis not present

## 2014-03-22 DIAGNOSIS — I69993 Ataxia following unspecified cerebrovascular disease: Secondary | ICD-10-CM | POA: Diagnosis not present

## 2014-03-22 DIAGNOSIS — R413 Other amnesia: Secondary | ICD-10-CM | POA: Diagnosis not present

## 2014-03-22 DIAGNOSIS — Z5189 Encounter for other specified aftercare: Secondary | ICD-10-CM | POA: Diagnosis not present

## 2014-03-22 DIAGNOSIS — I69998 Other sequelae following unspecified cerebrovascular disease: Secondary | ICD-10-CM | POA: Diagnosis not present

## 2014-03-26 ENCOUNTER — Ambulatory Visit (INDEPENDENT_AMBULATORY_CARE_PROVIDER_SITE_OTHER): Payer: Medicare Other | Admitting: Internal Medicine

## 2014-03-26 ENCOUNTER — Encounter: Payer: Self-pay | Admitting: Internal Medicine

## 2014-03-26 VITALS — BP 122/76 | HR 76 | Temp 98.3°F | Resp 16 | Ht 62.0 in | Wt 174.5 lb

## 2014-03-26 DIAGNOSIS — Z5189 Encounter for other specified aftercare: Secondary | ICD-10-CM | POA: Diagnosis not present

## 2014-03-26 DIAGNOSIS — G629 Polyneuropathy, unspecified: Secondary | ICD-10-CM

## 2014-03-26 DIAGNOSIS — K219 Gastro-esophageal reflux disease without esophagitis: Secondary | ICD-10-CM

## 2014-03-26 DIAGNOSIS — R7309 Other abnormal glucose: Secondary | ICD-10-CM

## 2014-03-26 DIAGNOSIS — I6350 Cerebral infarction due to unspecified occlusion or stenosis of unspecified cerebral artery: Secondary | ICD-10-CM | POA: Diagnosis not present

## 2014-03-26 DIAGNOSIS — E7800 Pure hypercholesterolemia, unspecified: Secondary | ICD-10-CM

## 2014-03-26 DIAGNOSIS — F32A Depression, unspecified: Secondary | ICD-10-CM

## 2014-03-26 DIAGNOSIS — F329 Major depressive disorder, single episode, unspecified: Secondary | ICD-10-CM

## 2014-03-26 DIAGNOSIS — E039 Hypothyroidism, unspecified: Secondary | ICD-10-CM | POA: Diagnosis not present

## 2014-03-26 DIAGNOSIS — I10 Essential (primary) hypertension: Secondary | ICD-10-CM

## 2014-03-26 DIAGNOSIS — R269 Unspecified abnormalities of gait and mobility: Secondary | ICD-10-CM | POA: Diagnosis not present

## 2014-03-26 DIAGNOSIS — N83209 Unspecified ovarian cyst, unspecified side: Secondary | ICD-10-CM

## 2014-03-26 DIAGNOSIS — F3289 Other specified depressive episodes: Secondary | ICD-10-CM

## 2014-03-26 DIAGNOSIS — K5730 Diverticulosis of large intestine without perforation or abscess without bleeding: Secondary | ICD-10-CM

## 2014-03-26 DIAGNOSIS — I639 Cerebral infarction, unspecified: Secondary | ICD-10-CM

## 2014-03-26 DIAGNOSIS — R739 Hyperglycemia, unspecified: Secondary | ICD-10-CM

## 2014-03-26 DIAGNOSIS — I69998 Other sequelae following unspecified cerebrovascular disease: Secondary | ICD-10-CM | POA: Diagnosis not present

## 2014-03-26 DIAGNOSIS — G589 Mononeuropathy, unspecified: Secondary | ICD-10-CM

## 2014-03-26 DIAGNOSIS — K5790 Diverticulosis of intestine, part unspecified, without perforation or abscess without bleeding: Secondary | ICD-10-CM

## 2014-03-26 DIAGNOSIS — R32 Unspecified urinary incontinence: Secondary | ICD-10-CM

## 2014-03-26 NOTE — Progress Notes (Signed)
Pre visit review using our clinic review tool, if applicable. No additional management support is needed unless otherwise documented below in the visit note. 

## 2014-03-29 ENCOUNTER — Telehealth: Payer: Self-pay | Admitting: *Deleted

## 2014-03-29 DIAGNOSIS — I10 Essential (primary) hypertension: Secondary | ICD-10-CM | POA: Diagnosis not present

## 2014-03-29 DIAGNOSIS — R269 Unspecified abnormalities of gait and mobility: Secondary | ICD-10-CM | POA: Diagnosis not present

## 2014-03-29 DIAGNOSIS — I69998 Other sequelae following unspecified cerebrovascular disease: Secondary | ICD-10-CM | POA: Diagnosis not present

## 2014-03-29 DIAGNOSIS — Z5189 Encounter for other specified aftercare: Secondary | ICD-10-CM | POA: Diagnosis not present

## 2014-03-29 NOTE — Telephone Encounter (Signed)
Sandra Kaufman with [facility] called to report: #1) Needs verbal order to continue therapy twice a week x 2wks (VO given). #2) Needs a cheaper reflux medication- Dexilant is $95. #3) Did not see where her Myrbetriq was refilled at her last visit & pt C/O increased urinary incontinence. May contact patient at home re: medications @ [phone].

## 2014-03-30 ENCOUNTER — Encounter: Payer: Self-pay | Admitting: Internal Medicine

## 2014-03-30 MED ORDER — SERTRALINE HCL 50 MG PO TABS: ORAL_TABLET | ORAL | Status: DC

## 2014-03-30 MED ORDER — RANITIDINE HCL 300 MG PO TABS: 300.0000 mg | ORAL_TABLET | Freq: Every day | ORAL | Status: DC

## 2014-03-30 MED ORDER — ATORVASTATIN CALCIUM 40 MG PO TABS: 40.0000 mg | ORAL_TABLET | Freq: Every day | ORAL | Status: DC

## 2014-03-30 NOTE — Assessment & Plan Note (Signed)
On amitriptyline.  Follow.  

## 2014-03-30 NOTE — Progress Notes (Signed)
Subjective:    Patient ID: Sandra Kaufman, female    DOB: 04-27-1932, 80 y.o.   MRN: [id number]  HPI 80 year old female with past history of hypertension, COPD, GERD and hypercholesterolemia who comes in today for a scheduled follow up.  Was scheduled for a physical initially.  Wants this to be a recheck.  Was admitted 02/05/14 with CVA.  See hospital records for details.  Went to rehab after her discharge.  Now at home.  Has in home care from 9:00 am until 9:00pm.  She is accompanied by her son.  History obtained from both of them.  She is doing better.  States swallowing is better.  Does have some short term memory loss.  Her son states this is improving.   Breathing stable.  Acid reflux controlled.  No abdominal pain or cramping.  Bowels stable.  Working with speech therapy.  Doing better.  Getting up and down easier.  Not using her walker.  Appetite is good.  Had questions about driving.  Saw neurology.  Not able to drive.  Discussed her medications.      Past Medical History  Diagnosis Date  . Hypertension   . Hypercholesterolemia   . Hypothyroidism   . COPD (chronic obstructive pulmonary disease)   . GERD (gastroesophageal reflux disease)     with esophageal stricture requiring dilatation x 2 (12/96 and 10/99)  . Arthritis   . Glaucoma   . Peripheral neuropathy   . History of chicken pox   . Diverticulosis   . Migraines   . Colon polyps   . Urinary incontinence     Current Outpatient Prescriptions on File Prior to Visit  Medication Sig Dispense Refill  . amitriptyline (ELAVIL) 10 MG tablet TAKE 2 TABLETS BY MOUTH AT BEDTIME      . aspirin 325 MG tablet Take 325 mg by mouth daily.      Marland Kitchen atorvastatin (LIPITOR) 40 MG tablet Take 40 mg by mouth daily.      . bimatoprost (LUMIGAN) 0.03 % ophthalmic solution Place 1 drop into both eyes at bedtime.      . brinzolamide (AZOPT) 1 % ophthalmic suspension Place 1 drop into both eyes 2 (two) times daily.       . Coenzyme Q10 (CO Q10) 100 MG  CAPS Take 1 capsule by mouth daily.      . colesevelam (WELCHOL) 625 MG tablet Take 1,875 mg by mouth 2 (two) times daily with a meal.       . cyanocobalamin 2000 MCG tablet Take 2,000 mcg by mouth daily.      Marland Kitchen dexlansoprazole (DEXILANT) 60 MG capsule Take 60 mg by mouth daily.      . fluticasone (FLONASE) 50 MCG/ACT nasal spray PLACE 2 SPRAYS INTO THE NOSE DAILY.  16 g  2  . irbesartan (AVAPRO) 150 MG tablet TAKE 1 TABLET BY MOUTH EVERY DAY  90 tablet  1  . NAMENDA 10 MG tablet TAKE 1 TABLET BY MOUTH TWICE A DAY  180 tablet  1  . PROAIR HFA 108 (90 BASE) MCG/ACT inhaler INHALE 2 PUFFS INTO THE LUNGS EVERY 6 (SIX) HOURS AS NEEDED FOR WHEEZING OR SHORTNESS OF BREATH.  8.5 each  0  . psyllium (METAMUCIL) 0.52 G capsule 4 capsules bid      . ranitidine (ZANTAC) 300 MG tablet Take 300 mg by mouth daily.      . sertraline (ZOLOFT) 50 MG tablet Take 1 tablet q day  30 tablet  2   No current facility-administered medications on file prior to visit.    Review of Systems Patient denies any headache, lightheadedness or dizziness.  No sinus or allergy symptoms.  No chest pain, tightness or palpitations.  No increased shortness of breath, cough or congestion.  No nausea or vomiting.  Acid reflux appears to be under reasonable controlled.  No trouble swallowing.   No abdominal pain or cramping.  No bowel change, such as constipation, BRBPR or melana.   No urine change.   Eating and drinking well.  Not using her walker.   Short term memory loss.       Objective:   Physical Exam  Filed Vitals:   03/26/14 1530  BP: 122/76  Pulse: 76  Temp: 98.3 F (36.8 C)  Resp: 16   Blood pressure recheck:  31/50  80 year old female in no acute distress.   HEENT:  Nares- clear.  Oropharynx - without lesions. NECK:  Supple.  Nontender.  No audible bruit.  HEART:  Appears to be regular. LUNGS:  No crackles or wheezing audible.  Respirations even and unlabored.  RADIAL PULSE:  Equal bilaterally.  ABDOMEN:   Soft, nontender.  Bowel sounds present and normal.  No audible abdominal bruit.  EXTREMITIES:  No increased edema present.  DP pulses palpable and equal bilaterally.           Assessment & Plan:  INCREASED PSYCHOSOCIAL STRESSORS/DEPRESSION.  Back on zoloft.  Follow.      MSK.  Seeing Smith Robert.  Previous MRI - spinal stenosis and degenerative changes. Stable.    CARDIOVASCULAR.  Has seen Dr Ubaldo Glassing.  Stress test 06/09/11 negative for ischemia.  ECHO 06/09/11 revealed mild LVH, EF 60%, mild TR, mild pulmonary hypertension and mild diastolic dysfunction.  He ordered PFTs.  Revealed some obstruction and restriction with decreased diffusion capacity.  Currently stable.    PULMONARY.  Breathing stable.      HEALTH MAINTENANCE.  Physical 03/17/13.   Colonoscopy 06/05/10 with polyp removed and diverticulosis.  Mammogram 04/04/13 - Birads II.    I spent 40 minutes with the patient and more than 50% of the time was spent in consultation regarding the above.

## 2014-03-30 NOTE — Assessment & Plan Note (Signed)
Restart zoloft.   Follow.

## 2014-03-30 NOTE — Assessment & Plan Note (Signed)
Seeing Dr Cope.   

## 2014-03-30 NOTE — Assessment & Plan Note (Signed)
Blood pressure doing well.  Same medication regimen.  Follow.  Follow metabolic panel.     

## 2014-03-30 NOTE — Assessment & Plan Note (Signed)
Bowels better on metamucil and probiotic.  Follow.

## 2014-03-30 NOTE — Assessment & Plan Note (Signed)
Previous ultrasound revealed an ovarian cyst.  Saw Dr Enzo Bi.  Kristeen Miss things were stable.  Recommended follow up in one year.  Was last evaluated 07/13/12 and was released.

## 2014-03-30 NOTE — Assessment & Plan Note (Signed)
Recent admission for CVA.  Short term memory loss.  No swallowing problems.  Continue on daily aspirin.  Saw neurology.  Refer to his note for details.

## 2014-03-30 NOTE — Assessment & Plan Note (Signed)
On thyroid replacement.  Follow tsh.  

## 2014-03-30 NOTE — Assessment & Plan Note (Signed)
Low carb diet.  Follow met b and a1c.

## 2014-03-30 NOTE — Telephone Encounter (Signed)
She sees Dr Jacqlyn Larsen.  He is the one that started her on Myrbetriq and has been following.  I do recommend continued f/u with him - given her history.  Also, she has tried multiple medications previously for her reflux and swallowing issues.  Dr Allen Norris put her on [place].  Will need to know if agreeable to try another medication.  If so and has not tried nexium, can try nexium 40mg  q day.

## 2014-03-30 NOTE — Assessment & Plan Note (Signed)
Low cholesterol diet.  Check lipid panel with next labs.  On atorvastatin.  Follow.    

## 2014-03-30 NOTE — Assessment & Plan Note (Signed)
EGD as outlined.  Saw Dr Allen Norris for evaluation.  Continue dexilant and zantac.   Follow.

## 2014-04-02 ENCOUNTER — Telehealth: Payer: Self-pay | Admitting: *Deleted

## 2014-04-02 ENCOUNTER — Other Ambulatory Visit: Payer: Self-pay | Admitting: *Deleted

## 2014-04-02 MED ORDER — ESOMEPRAZOLE MAGNESIUM 40 MG PO CPDR: 40.0000 mg | DELAYED_RELEASE_CAPSULE | Freq: Every day | ORAL | Status: DC

## 2014-04-02 NOTE — Telephone Encounter (Signed)
I have the information from Dr Melrose Nakayama.  Are we allowed to send this to [facility] or do we need pts ok.  I assume for continuing care we can send info to [facility] - may want to confirm with pt or her son - ok to send.  Thanks.

## 2014-04-02 NOTE — Telephone Encounter (Signed)
[name] with [facility] states that Sandra Kaufman mentioned to her that Dr. Melrose Nakayama sent you a letter. [name] would like for [place] to fax her a copy of the letter in hopes that it would be helpful to her treatment. Fax# [phone]

## 2014-04-02 NOTE — Telephone Encounter (Signed)
Pt notified to contact Dr. Jacqlyn Larsen with refill on the Myrbetriq. Pt could not remember if she has tried Nexium 40 mg, but she was willing to try it. Rx sent to [facility].

## 2014-04-03 ENCOUNTER — Other Ambulatory Visit: Payer: Self-pay | Admitting: Internal Medicine

## 2014-04-03 DIAGNOSIS — I69998 Other sequelae following unspecified cerebrovascular disease: Secondary | ICD-10-CM | POA: Diagnosis not present

## 2014-04-03 DIAGNOSIS — I10 Essential (primary) hypertension: Secondary | ICD-10-CM | POA: Diagnosis not present

## 2014-04-03 DIAGNOSIS — R269 Unspecified abnormalities of gait and mobility: Secondary | ICD-10-CM | POA: Diagnosis not present

## 2014-04-03 DIAGNOSIS — Z5189 Encounter for other specified aftercare: Secondary | ICD-10-CM | POA: Diagnosis not present

## 2014-04-03 NOTE — Telephone Encounter (Signed)
Pt gave verbal permission to fax letter to [name]. Letter faxed

## 2014-04-04 NOTE — Telephone Encounter (Signed)
Refilled synthroid #90 with one refill.

## 2014-04-04 NOTE — Telephone Encounter (Signed)
Ok to fill 

## 2014-04-05 DIAGNOSIS — R269 Unspecified abnormalities of gait and mobility: Secondary | ICD-10-CM | POA: Diagnosis not present

## 2014-04-05 DIAGNOSIS — Z5189 Encounter for other specified aftercare: Secondary | ICD-10-CM | POA: Diagnosis not present

## 2014-04-05 DIAGNOSIS — I10 Essential (primary) hypertension: Secondary | ICD-10-CM | POA: Diagnosis not present

## 2014-04-05 DIAGNOSIS — I69998 Other sequelae following unspecified cerebrovascular disease: Secondary | ICD-10-CM | POA: Diagnosis not present

## 2014-04-11 DIAGNOSIS — I10 Essential (primary) hypertension: Secondary | ICD-10-CM | POA: Diagnosis not present

## 2014-04-11 DIAGNOSIS — R269 Unspecified abnormalities of gait and mobility: Secondary | ICD-10-CM | POA: Diagnosis not present

## 2014-04-11 DIAGNOSIS — Z5189 Encounter for other specified aftercare: Secondary | ICD-10-CM | POA: Diagnosis not present

## 2014-04-11 DIAGNOSIS — I69998 Other sequelae following unspecified cerebrovascular disease: Secondary | ICD-10-CM | POA: Diagnosis not present

## 2014-04-13 ENCOUNTER — Telehealth: Payer: Self-pay | Admitting: *Deleted

## 2014-04-13 DIAGNOSIS — R269 Unspecified abnormalities of gait and mobility: Secondary | ICD-10-CM | POA: Diagnosis not present

## 2014-04-13 DIAGNOSIS — Z5189 Encounter for other specified aftercare: Secondary | ICD-10-CM | POA: Diagnosis not present

## 2014-04-13 DIAGNOSIS — I69998 Other sequelae following unspecified cerebrovascular disease: Secondary | ICD-10-CM | POA: Diagnosis not present

## 2014-04-13 DIAGNOSIS — I10 Essential (primary) hypertension: Secondary | ICD-10-CM | POA: Diagnosis not present

## 2014-04-13 NOTE — Telephone Encounter (Signed)
[name] called to report that pt fell on Mon & Tue mornings. No injuries involved. Requesting a Verbal Order for Home health speech for 3 more visits (through 04/24/14)

## 2014-04-13 NOTE — Telephone Encounter (Signed)
Left verbal order on [name] voicemail & asked if any head injury was reported

## 2014-04-13 NOTE — Telephone Encounter (Signed)
Ok verbal order.  Make sure no head injury, etc.  Let us know if any problems.

## 2014-04-13 NOTE — Telephone Encounter (Signed)
[name] left voicemail to report that there was no dizziness, head injury, or LOC involved with fall.

## 2014-04-17 DIAGNOSIS — D485 Neoplasm of uncertain behavior of skin: Secondary | ICD-10-CM | POA: Diagnosis not present

## 2014-04-17 DIAGNOSIS — Z1283 Encounter for screening for malignant neoplasm of skin: Secondary | ICD-10-CM | POA: Diagnosis not present

## 2014-04-17 DIAGNOSIS — D0430 Carcinoma in situ of skin of unspecified part of face: Secondary | ICD-10-CM | POA: Diagnosis not present

## 2014-04-17 DIAGNOSIS — L821 Other seborrheic keratosis: Secondary | ICD-10-CM | POA: Diagnosis not present

## 2014-04-17 DIAGNOSIS — D0439 Carcinoma in situ of skin of other parts of face: Secondary | ICD-10-CM | POA: Diagnosis not present

## 2014-04-17 DIAGNOSIS — L570 Actinic keratosis: Secondary | ICD-10-CM | POA: Diagnosis not present

## 2014-04-17 DIAGNOSIS — Z85828 Personal history of other malignant neoplasm of skin: Secondary | ICD-10-CM | POA: Diagnosis not present

## 2014-04-18 DIAGNOSIS — Z5189 Encounter for other specified aftercare: Secondary | ICD-10-CM | POA: Diagnosis not present

## 2014-04-18 DIAGNOSIS — I10 Essential (primary) hypertension: Secondary | ICD-10-CM | POA: Diagnosis not present

## 2014-04-18 DIAGNOSIS — R269 Unspecified abnormalities of gait and mobility: Secondary | ICD-10-CM | POA: Diagnosis not present

## 2014-04-18 DIAGNOSIS — I69998 Other sequelae following unspecified cerebrovascular disease: Secondary | ICD-10-CM | POA: Diagnosis not present

## 2014-04-20 DIAGNOSIS — R269 Unspecified abnormalities of gait and mobility: Secondary | ICD-10-CM | POA: Diagnosis not present

## 2014-04-20 DIAGNOSIS — I69998 Other sequelae following unspecified cerebrovascular disease: Secondary | ICD-10-CM | POA: Diagnosis not present

## 2014-04-20 DIAGNOSIS — I10 Essential (primary) hypertension: Secondary | ICD-10-CM | POA: Diagnosis not present

## 2014-04-20 DIAGNOSIS — Z5189 Encounter for other specified aftercare: Secondary | ICD-10-CM | POA: Diagnosis not present

## 2014-04-23 DIAGNOSIS — I10 Essential (primary) hypertension: Secondary | ICD-10-CM | POA: Diagnosis not present

## 2014-04-23 DIAGNOSIS — I69998 Other sequelae following unspecified cerebrovascular disease: Secondary | ICD-10-CM | POA: Diagnosis not present

## 2014-04-23 DIAGNOSIS — Z5189 Encounter for other specified aftercare: Secondary | ICD-10-CM | POA: Diagnosis not present

## 2014-04-23 DIAGNOSIS — R269 Unspecified abnormalities of gait and mobility: Secondary | ICD-10-CM | POA: Diagnosis not present

## 2014-04-26 ENCOUNTER — Telehealth: Payer: Self-pay | Admitting: Internal Medicine

## 2014-04-26 NOTE — Telephone Encounter (Signed)
I have not received any information from dermatology to date.  Regarding the form, I will look over the form.

## 2014-04-26 NOTE — Telephone Encounter (Signed)
Called to confirm pt appt.  Pt thought she was to come in 4/30 p.m.  Advised appt 5/6.  States pt told him she has just seen a dermatologist and had skin grafting done to her face which came back positive for skin cancer.  Son is asking if Dr. Nicki Reaper knows anything about this.  States a dermatology kit was purchased and some sort of cosmetic from dermatologist.  Also, he has some information he needs Dr. Nicki Reaper to fill out for long-term disability insurance care.  States he will be dropping that off at the office.

## 2014-04-26 NOTE — Telephone Encounter (Signed)
Correction left voicemail for son [name])

## 2014-04-26 NOTE — Telephone Encounter (Signed)
Left voicemail for con [name])

## 2014-05-02 ENCOUNTER — Encounter: Payer: Self-pay | Admitting: Internal Medicine

## 2014-05-02 ENCOUNTER — Encounter (INDEPENDENT_AMBULATORY_CARE_PROVIDER_SITE_OTHER): Payer: Self-pay

## 2014-05-02 ENCOUNTER — Ambulatory Visit (INDEPENDENT_AMBULATORY_CARE_PROVIDER_SITE_OTHER): Payer: Medicare Other | Admitting: Internal Medicine

## 2014-05-02 VITALS — BP 122/78 | HR 85 | Temp 98.2°F | Resp 16 | Wt 174.5 lb

## 2014-05-02 DIAGNOSIS — R32 Unspecified urinary incontinence: Secondary | ICD-10-CM

## 2014-05-02 DIAGNOSIS — I639 Cerebral infarction, unspecified: Secondary | ICD-10-CM

## 2014-05-02 DIAGNOSIS — G629 Polyneuropathy, unspecified: Secondary | ICD-10-CM

## 2014-05-02 DIAGNOSIS — K5730 Diverticulosis of large intestine without perforation or abscess without bleeding: Secondary | ICD-10-CM

## 2014-05-02 DIAGNOSIS — R0602 Shortness of breath: Secondary | ICD-10-CM

## 2014-05-02 DIAGNOSIS — E7800 Pure hypercholesterolemia, unspecified: Secondary | ICD-10-CM | POA: Diagnosis not present

## 2014-05-02 DIAGNOSIS — I6350 Cerebral infarction due to unspecified occlusion or stenosis of unspecified cerebral artery: Secondary | ICD-10-CM | POA: Diagnosis not present

## 2014-05-02 DIAGNOSIS — R7309 Other abnormal glucose: Secondary | ICD-10-CM

## 2014-05-02 DIAGNOSIS — N83209 Unspecified ovarian cyst, unspecified side: Secondary | ICD-10-CM

## 2014-05-02 DIAGNOSIS — E039 Hypothyroidism, unspecified: Secondary | ICD-10-CM

## 2014-05-02 DIAGNOSIS — I10 Essential (primary) hypertension: Secondary | ICD-10-CM

## 2014-05-02 DIAGNOSIS — F32A Depression, unspecified: Secondary | ICD-10-CM

## 2014-05-02 DIAGNOSIS — R739 Hyperglycemia, unspecified: Secondary | ICD-10-CM

## 2014-05-02 DIAGNOSIS — F3289 Other specified depressive episodes: Secondary | ICD-10-CM

## 2014-05-02 DIAGNOSIS — K5790 Diverticulosis of intestine, part unspecified, without perforation or abscess without bleeding: Secondary | ICD-10-CM

## 2014-05-02 DIAGNOSIS — F329 Major depressive disorder, single episode, unspecified: Secondary | ICD-10-CM

## 2014-05-02 DIAGNOSIS — K219 Gastro-esophageal reflux disease without esophagitis: Secondary | ICD-10-CM

## 2014-05-02 DIAGNOSIS — G589 Mononeuropathy, unspecified: Secondary | ICD-10-CM

## 2014-05-02 NOTE — Progress Notes (Signed)
Pre visit review using our clinic review tool, if applicable. No additional management support is needed unless otherwise documented below in the visit note. 

## 2014-05-06 ENCOUNTER — Encounter: Payer: Self-pay | Admitting: Internal Medicine

## 2014-05-06 DIAGNOSIS — R0602 Shortness of breath: Secondary | ICD-10-CM | POA: Insufficient documentation

## 2014-05-06 NOTE — Assessment & Plan Note (Signed)
Low carb diet.  Follow met b and a1c.   

## 2014-05-06 NOTE — Assessment & Plan Note (Signed)
Describes sob with exertion.  EKG with increased artifact.  Does not appear to have acute ischemic changes.  Will have cardiology review and give further recommendations.

## 2014-05-06 NOTE — Assessment & Plan Note (Signed)
Bowels better on metamucil and probiotic.  Follow.   

## 2014-05-06 NOTE — Assessment & Plan Note (Signed)
On thyroid replacement.  Follow tsh.  

## 2014-05-06 NOTE — Assessment & Plan Note (Signed)
Previous ultrasound revealed an ovarian cyst.  Saw Dr DeFrancesco.  Felt things were stable.  Recommended follow up in one year.  Was last evaluated 07/13/12 and was released.    

## 2014-05-06 NOTE — Assessment & Plan Note (Signed)
EGD as outlined.  Saw Dr Wohl for evaluation.  Continue dexilant and zantac.   Follow.    

## 2014-05-06 NOTE — Assessment & Plan Note (Signed)
On amitriptyline.  Follow.  

## 2014-05-06 NOTE — Assessment & Plan Note (Signed)
Seeing Dr Cope.   

## 2014-05-06 NOTE — Progress Notes (Signed)
Subjective:    Patient ID: Sandra Kaufman, female    DOB: 1932/03/07, 80 y.o.   MRN: [id number]  HPI 80 year old female with past history of hypertension, COPD, GERD and hypercholesterolemia who comes in today for a scheduled follow up.  Was admitted 02/05/14 with CVA.  See hospital records for details.  Went to rehab after her discharge. Now at home.  Has in home care from 9:00 am until 9:00pm.  She is accompanied by her son.  History obtained from both of them.  She is doing better.  States swallowing is better.  Does have some short term memory loss.  Her son states this is improving, but still has issues with her memory.  States she calls and ask multiple questions and repeats questions.   Breathing stable.  Acid reflux controlled.  No abdominal pain or cramping.  Bowels stable.  Getting up and down easier.  Not using her walker.  Appetite is good.  Had questions about driving.  Saw neurology.  Not able to drive.  Discussed her medications.  She is walking more.  Trying to watch her diet.  Able to feed and bathe herself.  Not using her gas stove.      Past Medical History  Diagnosis Date  . Hypertension   . Hypercholesterolemia   . Hypothyroidism   . COPD (chronic obstructive pulmonary disease)   . GERD (gastroesophageal reflux disease)     with esophageal stricture requiring dilatation x 2 (12/96 and 10/99)  . Arthritis   . Glaucoma   . Peripheral neuropathy   . History of chicken pox   . Diverticulosis   . Migraines   . Colon polyps   . Urinary incontinence     Current Outpatient Prescriptions on File Prior to Visit  Medication Sig Dispense Refill  . amitriptyline (ELAVIL) 10 MG tablet TAKE 2 TABLETS BY MOUTH AT BEDTIME      . aspirin 325 MG tablet Take 325 mg by mouth daily.      Marland Kitchen atorvastatin (LIPITOR) 40 MG tablet Take 1 tablet (40 mg total) by mouth daily.  30 tablet  5  . bimatoprost (LUMIGAN) 0.03 % ophthalmic solution Place 1 drop into both eyes at bedtime.      .  brinzolamide (AZOPT) 1 % ophthalmic suspension Place 1 drop into both eyes 2 (two) times daily.       . Coenzyme Q10 (CO Q10) 100 MG CAPS Take 1 capsule by mouth daily.      . colesevelam (WELCHOL) 625 MG tablet Take 1,875 mg by mouth 2 (two) times daily with a meal.       . cyanocobalamin 2000 MCG tablet Take 2,000 mcg by mouth daily.      Marland Kitchen esomeprazole (NEXIUM) 40 MG capsule Take 1 capsule (40 mg total) by mouth daily at 12 noon.  30 capsule  5  . fluticasone (FLONASE) 50 MCG/ACT nasal spray PLACE 2 SPRAYS INTO THE NOSE DAILY.  16 g  2  . irbesartan (AVAPRO) 150 MG tablet TAKE 1 TABLET BY MOUTH EVERY DAY  90 tablet  1  . NAMENDA 10 MG tablet TAKE 1 TABLET BY MOUTH TWICE A DAY  180 tablet  1  . PROAIR HFA 108 (90 BASE) MCG/ACT inhaler INHALE 2 PUFFS INTO THE LUNGS EVERY 6 (SIX) HOURS AS NEEDED FOR WHEEZING OR SHORTNESS OF BREATH.  8.5 each  0  . psyllium (METAMUCIL) 0.52 G capsule 4 capsules bid      . ranitidine (  ZANTAC) 300 MG tablet Take 1 tablet (300 mg total) by mouth daily.  30 tablet  5  . sertraline (ZOLOFT) 50 MG tablet Take 1 tablet q day  30 tablet  3  . SYNTHROID 125 MCG tablet TAKE 1 TABLET (125 MCG TOTAL) BY MOUTH DAILY.  90 tablet  1   No current facility-administered medications on file prior to visit.    Review of Systems Patient denies any headache, lightheadedness or dizziness.  No sinus or allergy symptoms.  No chest pain, tightness or palpitations.  Son and pt describe sob with exertion.  No cough or congestion.  No nausea or vomiting.  Acid reflux appears to be under reasonable controlled.  No trouble swallowing.   No abdominal pain or cramping.  No bowel change, such as constipation, BRBPR or melana.   No urine change.   Eating and drinking well.  Not using her walker.   Short term memory loss.       Objective:   Physical Exam  Filed Vitals:   05/02/14 1247  BP: 122/78  Pulse: 85  Temp: 98.2 F (36.8 C)  Resp: 16   Blood pressure recheck:  128/80  80 year  old female in no acute distress.   HEENT:  Nares- clear.  Oropharynx - without lesions. NECK:  Supple.  Nontender.  No audible bruit.  HEART:  Appears to be regular. LUNGS:  No crackles or wheezing audible.  Respirations even and unlabored.  RADIAL PULSE:  Equal bilaterally.  ABDOMEN:  Soft, nontender.  Bowel sounds present and normal.  No audible abdominal bruit.  EXTREMITIES:  No increased edema present.  DP pulses palpable and equal bilaterally.           Assessment & Plan:  INCREASED PSYCHOSOCIAL STRESSORS/DEPRESSION.  Back on zoloft.  Follow.      MSK.  Seeing Smith Robert.  Previous MRI - spinal stenosis and degenerative changes. Stable.    CARDIOVASCULAR.  Has seen Dr Ubaldo Glassing.  Stress test 06/09/11 negative for ischemia.  ECHO 06/09/11 revealed mild LVH, EF 60%, mild TR, mild pulmonary hypertension and mild diastolic dysfunction.  He ordered PFTs.  Revealed some obstruction and restriction with decreased diffusion capacity.  Currently stable.    PULMONARY.  Breathing stable.      HEALTH MAINTENANCE.  Physical 03/17/13.   Colonoscopy 06/05/10 with polyp removed and diverticulosis.  Mammogram 04/04/13 - Birads II.  Need to reschedule her physical when she is agreeable.  Also needs mammogram scheduled.    I spent over 40 minutes with the patient and more than 50% of the time was spent in consultation regarding the above.

## 2014-05-06 NOTE — Assessment & Plan Note (Signed)
Recent admission for CVA.  Short term memory loss.  No swallowing problems.  Continue on daily aspirin.  Saw neurology.  Refer to his note for details.  Schedule outpt follow up with neurology.  Will address her driving then.  I have asked her not to drive.

## 2014-05-06 NOTE — Assessment & Plan Note (Signed)
Low cholesterol diet.  Check lipid panel with next labs.  On atorvastatin.  Follow.    

## 2014-05-06 NOTE — Assessment & Plan Note (Signed)
Blood pressure doing well.  Same medication regimen.  Follow.  Follow metabolic panel.     

## 2014-05-06 NOTE — Assessment & Plan Note (Signed)
On zoloft.  Follow.  

## 2014-05-09 ENCOUNTER — Other Ambulatory Visit (INDEPENDENT_AMBULATORY_CARE_PROVIDER_SITE_OTHER): Payer: Medicare Other

## 2014-05-09 DIAGNOSIS — E039 Hypothyroidism, unspecified: Secondary | ICD-10-CM | POA: Diagnosis not present

## 2014-05-09 DIAGNOSIS — I639 Cerebral infarction, unspecified: Secondary | ICD-10-CM

## 2014-05-09 DIAGNOSIS — I6350 Cerebral infarction due to unspecified occlusion or stenosis of unspecified cerebral artery: Secondary | ICD-10-CM

## 2014-05-09 DIAGNOSIS — R7309 Other abnormal glucose: Secondary | ICD-10-CM

## 2014-05-09 DIAGNOSIS — I10 Essential (primary) hypertension: Secondary | ICD-10-CM

## 2014-05-09 DIAGNOSIS — E7800 Pure hypercholesterolemia, unspecified: Secondary | ICD-10-CM

## 2014-05-09 DIAGNOSIS — R739 Hyperglycemia, unspecified: Secondary | ICD-10-CM

## 2014-05-09 LAB — BASIC METABOLIC PANEL
BUN: 16 mg/dL (ref 6–23)
CO2: 25 mEq/L (ref 19–32)
Calcium: 8.9 mg/dL (ref 8.4–10.5)
Chloride: 107 mEq/L (ref 96–112)
Creatinine, Ser: 0.8 mg/dL (ref 0.4–1.2)
GFR: 71.3 mL/min (ref >60.00)
Glucose, Bld: 128 mg/dL — ABNORMAL HIGH (ref 70–99)
Potassium: 3.9 mEq/L (ref 3.5–5.1)
Sodium: 139 mEq/L (ref 135–145)

## 2014-05-09 LAB — HEPATIC FUNCTION PANEL
ALT: 15 U/L (ref 0–35)
AST: 13 U/L (ref 0–37)
Albumin: 3.6 g/dL (ref 3.5–5.2)
Alkaline Phosphatase: 67 U/L (ref 39–117)
Bilirubin, Direct: 0.1 mg/dL (ref 0.0–0.3)
Total Bilirubin: 0.9 mg/dL (ref 0.2–1.2)
Total Protein: 6.3 g/dL (ref 6.0–8.3)

## 2014-05-09 LAB — CBC WITH DIFFERENTIAL/PLATELET
Basophils Absolute: 0 10*3/uL (ref 0.0–0.1)
Basophils Relative: 0.6 % (ref 0.0–3.0)
Eosinophils Absolute: 0.2 10*3/uL (ref 0.0–0.7)
Eosinophils Relative: 3.1 % (ref 0.0–5.0)
HCT: 32.4 % — ABNORMAL LOW (ref 36.0–46.0)
Hemoglobin: 10.8 g/dL — ABNORMAL LOW (ref 12.0–15.0)
Lymphocytes Relative: 21.6 % (ref 12.0–46.0)
Lymphs Abs: 1.2 10*3/uL (ref 0.7–4.0)
MCHC: 33.2 g/dL (ref 30.0–36.0)
MCV: 79.4 fl (ref 78.0–100.0)
Monocytes Absolute: 0.5 10*3/uL (ref 0.1–1.0)
Monocytes Relative: 8.7 % (ref 3.0–12.0)
Neutro Abs: 3.8 10*3/uL (ref 1.4–7.7)
Neutrophils Relative %: 66 % (ref 43.0–77.0)
Platelets: 227 10*3/uL (ref 150.0–400.0)
RBC: 4.08 Mil/uL (ref 3.87–5.11)
RDW: 15.3 % (ref 11.5–15.5)
WBC: 5.7 10*3/uL (ref 4.0–10.5)

## 2014-05-09 LAB — LIPID PANEL
Cholesterol: 150 mg/dL (ref 0–200)
HDL: 41.8 mg/dL (ref >39.00)
LDL Cholesterol: 86 mg/dL (ref 0–99)
Total CHOL/HDL Ratio: 4
Triglycerides: 110 mg/dL (ref 0.0–149.0)
VLDL: 22 mg/dL (ref 0.0–40.0)

## 2014-05-09 LAB — TSH: TSH: 0.09 u[IU]/mL — ABNORMAL LOW (ref 0.35–4.50)

## 2014-05-09 LAB — HEMOGLOBIN A1C: Hgb A1c MFr Bld: 6.9 % — ABNORMAL HIGH (ref 4.6–6.5)

## 2014-05-10 ENCOUNTER — Telehealth: Payer: Self-pay | Admitting: Internal Medicine

## 2014-05-10 ENCOUNTER — Other Ambulatory Visit: Payer: Self-pay | Admitting: Internal Medicine

## 2014-05-10 ENCOUNTER — Other Ambulatory Visit: Payer: Self-pay | Admitting: *Deleted

## 2014-05-10 DIAGNOSIS — D649 Anemia, unspecified: Secondary | ICD-10-CM

## 2014-05-10 DIAGNOSIS — R413 Other amnesia: Secondary | ICD-10-CM

## 2014-05-10 MED ORDER — LEVOTHYROXINE SODIUM 100 MCG PO TABS: 100.0000 ug | ORAL_TABLET | Freq: Every day | ORAL | Status: DC

## 2014-05-10 NOTE — Telephone Encounter (Signed)
LMTCB on son's [name]) voicemail to return call

## 2014-05-10 NOTE — Telephone Encounter (Signed)
I discussed with the patient and her son Sandra Kaufman) in the office regarding her driving.  She was referred to neurology for evaluation.  Informed for her not to drive now and that she would have to pass a driving evaluation before she could drive.  I have never contacted DM[employer] with information.  She will go through neurology.

## 2014-05-10 NOTE — Telephone Encounter (Signed)
See result note also 

## 2014-05-10 NOTE — Telephone Encounter (Signed)
Son notified. He also asked that we contact him once her paperwork has been completed & faxed in. He would like to pick up a copy also.

## 2014-05-10 NOTE — Telephone Encounter (Signed)
Sandra Kaufman called, son of pt.  States on behalf of himself and brother, Sandra Kaufman, they would like Dr. Nicki Reaper to contact [facility] for a do not drive order.  States to contact [place] with any questions or concerns since [name] is not listed on DPR.

## 2014-05-10 NOTE — Progress Notes (Signed)
Orders placed for f/u labs.  

## 2014-05-11 NOTE — Telephone Encounter (Signed)
Notified, copy made for chart, with records incomplete, have not faxed yet. Records left up front for pick up by son.

## 2014-05-11 NOTE — Telephone Encounter (Signed)
I have completed my portion of the form, but there are sections they are to complete.  Placed in Sandra Kaufman's box.

## 2014-05-14 DIAGNOSIS — H4010X0 Unspecified open-angle glaucoma, stage unspecified: Secondary | ICD-10-CM | POA: Diagnosis not present

## 2014-05-16 ENCOUNTER — Other Ambulatory Visit: Payer: Medicare Other

## 2014-05-16 ENCOUNTER — Encounter: Payer: Self-pay | Admitting: Internal Medicine

## 2014-05-18 ENCOUNTER — Other Ambulatory Visit: Payer: Self-pay | Admitting: Internal Medicine

## 2014-05-21 DIAGNOSIS — Z0279 Encounter for issue of other medical certificate: Secondary | ICD-10-CM

## 2014-05-22 ENCOUNTER — Telehealth: Payer: Self-pay | Admitting: Internal Medicine

## 2014-05-22 NOTE — Telephone Encounter (Signed)
Placed in your folder.

## 2014-05-22 NOTE — Telephone Encounter (Signed)
Sandra Kaufman son of [name] stopped by to drop off paper work to be filled out for leave of absence. Paper work is in Dr. Bary Leriche box. [name] request the paper work to be filled out soon. Please fax to [phone]

## 2014-05-23 ENCOUNTER — Ambulatory Visit: Payer: Medicare Other | Admitting: Cardiovascular Disease

## 2014-05-23 ENCOUNTER — Encounter: Payer: Self-pay | Admitting: *Deleted

## 2014-05-23 NOTE — Telephone Encounter (Signed)
Form completed and in your box.

## 2014-05-23 NOTE — Telephone Encounter (Signed)
FMLA form faxed to: [phone]. Tried to contact son, could not reach him (no voicemail).

## 2014-05-24 DIAGNOSIS — R270 Ataxia, unspecified: Secondary | ICD-10-CM | POA: Insufficient documentation

## 2014-05-24 DIAGNOSIS — E538 Deficiency of other specified B group vitamins: Secondary | ICD-10-CM | POA: Diagnosis not present

## 2014-05-24 DIAGNOSIS — R29810 Facial weakness: Secondary | ICD-10-CM | POA: Diagnosis not present

## 2014-05-24 DIAGNOSIS — R413 Other amnesia: Secondary | ICD-10-CM | POA: Insufficient documentation

## 2014-05-24 DIAGNOSIS — R279 Unspecified lack of coordination: Secondary | ICD-10-CM | POA: Diagnosis not present

## 2014-06-08 ENCOUNTER — Other Ambulatory Visit: Payer: Self-pay | Admitting: Internal Medicine

## 2014-06-08 DIAGNOSIS — E039 Hypothyroidism, unspecified: Secondary | ICD-10-CM

## 2014-06-11 ENCOUNTER — Other Ambulatory Visit (INDEPENDENT_AMBULATORY_CARE_PROVIDER_SITE_OTHER): Payer: Medicare Other

## 2014-06-11 DIAGNOSIS — R7309 Other abnormal glucose: Secondary | ICD-10-CM | POA: Diagnosis not present

## 2014-06-11 DIAGNOSIS — E039 Hypothyroidism, unspecified: Secondary | ICD-10-CM

## 2014-06-11 DIAGNOSIS — R739 Hyperglycemia, unspecified: Secondary | ICD-10-CM

## 2014-06-11 DIAGNOSIS — D649 Anemia, unspecified: Secondary | ICD-10-CM

## 2014-06-11 DIAGNOSIS — E7800 Pure hypercholesterolemia, unspecified: Secondary | ICD-10-CM

## 2014-06-11 DIAGNOSIS — I10 Essential (primary) hypertension: Secondary | ICD-10-CM | POA: Diagnosis not present

## 2014-06-11 DIAGNOSIS — R413 Other amnesia: Secondary | ICD-10-CM | POA: Diagnosis not present

## 2014-06-11 LAB — LIPID PANEL
Cholesterol: 120 mg/dL (ref 0–200)
HDL: 41.8 mg/dL (ref >39.00)
LDL Cholesterol: 52 mg/dL (ref 0–99)
NonHDL: 78.2
Total CHOL/HDL Ratio: 3
Triglycerides: 131 mg/dL (ref 0.0–149.0)
VLDL: 26.2 mg/dL (ref 0.0–40.0)

## 2014-06-11 LAB — CBC WITH DIFFERENTIAL/PLATELET
Basophils Absolute: 0 10*3/uL (ref 0.0–0.1)
Basophils Relative: 0.6 % (ref 0.0–3.0)
Eosinophils Absolute: 0.1 10*3/uL (ref 0.0–0.7)
Eosinophils Relative: 2.2 % (ref 0.0–5.0)
HCT: 32.9 % — ABNORMAL LOW (ref 36.0–46.0)
Hemoglobin: 10.7 g/dL — ABNORMAL LOW (ref 12.0–15.0)
Lymphocytes Relative: 18.3 % (ref 12.0–46.0)
Lymphs Abs: 1 10*3/uL (ref 0.7–4.0)
MCHC: 32.7 g/dL (ref 30.0–36.0)
MCV: 79.3 fl (ref 78.0–100.0)
Monocytes Absolute: 0.4 10*3/uL (ref 0.1–1.0)
Monocytes Relative: 7.1 % (ref 3.0–12.0)
Neutro Abs: 4 10*3/uL (ref 1.4–7.7)
Neutrophils Relative %: 71.8 % (ref 43.0–77.0)
Platelets: 245 10*3/uL (ref 150.0–400.0)
RBC: 4.15 Mil/uL (ref 3.87–5.11)
RDW: 15.1 % (ref 11.5–15.5)
WBC: 5.6 10*3/uL (ref 4.0–10.5)

## 2014-06-11 LAB — IBC PANEL
Iron: 48 ug/dL (ref 42–145)
Saturation Ratios: 12.2 % — ABNORMAL LOW (ref 20.0–50.0)
Transferrin: 280 mg/dL (ref 212.0–360.0)

## 2014-06-11 LAB — FERRITIN: Ferritin: 13.1 ng/mL (ref 10.0–291.0)

## 2014-06-11 LAB — VITAMIN B12: Vitamin B-12: 952 pg/mL — ABNORMAL HIGH (ref 211–911)

## 2014-06-11 LAB — TSH: TSH: 0.15 u[IU]/mL — ABNORMAL LOW (ref 0.35–4.50)

## 2014-06-12 ENCOUNTER — Other Ambulatory Visit: Payer: Self-pay | Admitting: Internal Medicine

## 2014-06-12 DIAGNOSIS — D649 Anemia, unspecified: Secondary | ICD-10-CM

## 2014-06-12 DIAGNOSIS — E039 Hypothyroidism, unspecified: Secondary | ICD-10-CM

## 2014-06-12 NOTE — Progress Notes (Signed)
Orders placed for f/u labs.  

## 2014-06-15 ENCOUNTER — Other Ambulatory Visit: Payer: Self-pay | Admitting: Internal Medicine

## 2014-06-15 NOTE — Telephone Encounter (Signed)
Last refill 5.22.15, last OV 5.6.15.  Please advise refill

## 2014-06-17 NOTE — Telephone Encounter (Signed)
Refilled zoloft #30 with 2 refills.   

## 2014-06-18 ENCOUNTER — Ambulatory Visit: Payer: Medicare Other | Admitting: Cardiovascular Disease

## 2014-06-19 ENCOUNTER — Encounter: Payer: Self-pay | Admitting: *Deleted

## 2014-06-28 ENCOUNTER — Encounter: Payer: Self-pay | Admitting: *Deleted

## 2014-07-09 ENCOUNTER — Ambulatory Visit (INDEPENDENT_AMBULATORY_CARE_PROVIDER_SITE_OTHER): Payer: Medicare Other | Admitting: Internal Medicine

## 2014-07-09 ENCOUNTER — Encounter: Payer: Self-pay | Admitting: Internal Medicine

## 2014-07-09 VITALS — BP 122/70 | HR 89 | Temp 98.6°F | Ht 62.0 in | Wt 167.5 lb

## 2014-07-09 DIAGNOSIS — F3289 Other specified depressive episodes: Secondary | ICD-10-CM | POA: Diagnosis not present

## 2014-07-09 DIAGNOSIS — F32A Depression, unspecified: Secondary | ICD-10-CM

## 2014-07-09 DIAGNOSIS — I10 Essential (primary) hypertension: Secondary | ICD-10-CM

## 2014-07-09 DIAGNOSIS — R1310 Dysphagia, unspecified: Secondary | ICD-10-CM | POA: Diagnosis not present

## 2014-07-09 DIAGNOSIS — F329 Major depressive disorder, single episode, unspecified: Secondary | ICD-10-CM

## 2014-07-09 DIAGNOSIS — E039 Hypothyroidism, unspecified: Secondary | ICD-10-CM

## 2014-07-09 DIAGNOSIS — E7800 Pure hypercholesterolemia, unspecified: Secondary | ICD-10-CM

## 2014-07-09 DIAGNOSIS — K219 Gastro-esophageal reflux disease without esophagitis: Secondary | ICD-10-CM | POA: Diagnosis not present

## 2014-07-09 DIAGNOSIS — G629 Polyneuropathy, unspecified: Secondary | ICD-10-CM

## 2014-07-09 DIAGNOSIS — R0602 Shortness of breath: Secondary | ICD-10-CM

## 2014-07-09 DIAGNOSIS — R7309 Other abnormal glucose: Secondary | ICD-10-CM

## 2014-07-09 DIAGNOSIS — I6350 Cerebral infarction due to unspecified occlusion or stenosis of unspecified cerebral artery: Secondary | ICD-10-CM

## 2014-07-09 DIAGNOSIS — G589 Mononeuropathy, unspecified: Secondary | ICD-10-CM

## 2014-07-09 DIAGNOSIS — I639 Cerebral infarction, unspecified: Secondary | ICD-10-CM

## 2014-07-09 DIAGNOSIS — R739 Hyperglycemia, unspecified: Secondary | ICD-10-CM

## 2014-07-09 MED ORDER — SERTRALINE HCL 25 MG PO TABS: ORAL_TABLET | ORAL | Status: DC

## 2014-07-09 MED ORDER — FLUTICASONE PROPIONATE HFA 110 MCG/ACT IN AERO: 2.0000 | INHALATION_SPRAY | Freq: Two times a day (BID) | RESPIRATORY_TRACT | Status: DC

## 2014-07-09 NOTE — Progress Notes (Signed)
Pre visit review using our clinic review tool, if applicable. No additional management support is needed unless otherwise documented below in the visit note. 

## 2014-07-09 NOTE — Patient Instructions (Signed)
Change zoloft to 25mg  (take three tablets per day).    Flovent inhaler - 2 puffs twice a day.    Use the rescue inhaler as needed.

## 2014-07-11 ENCOUNTER — Other Ambulatory Visit: Payer: Self-pay | Admitting: Internal Medicine

## 2014-07-11 NOTE — Telephone Encounter (Signed)
Refilled amitriptyline #60 with 2 refills.

## 2014-07-11 NOTE — Telephone Encounter (Signed)
Last refill 6.18.15, last OV 7.13.15, future OV 8.24.15.  Please advise refill

## 2014-07-12 ENCOUNTER — Other Ambulatory Visit: Payer: Self-pay | Admitting: Internal Medicine

## 2014-07-14 ENCOUNTER — Encounter: Payer: Self-pay | Admitting: Internal Medicine

## 2014-07-14 DIAGNOSIS — R1310 Dysphagia, unspecified: Secondary | ICD-10-CM | POA: Insufficient documentation

## 2014-07-14 NOTE — Assessment & Plan Note (Signed)
Has been having increased problems swallowing.  Small bites.  Chew food well.  States feels like her food is getting stuck.  Will refer back to Dr Allen Norris for evaluation.

## 2014-07-14 NOTE — Assessment & Plan Note (Signed)
On amitriptyline.  Follow.  

## 2014-07-14 NOTE — Progress Notes (Signed)
Subjective:    Patient ID: Sandra Kaufman, female    DOB: 1932/04/10, 80 y.o.   MRN: [id number]  HPI 80 year old female with past history of hypertension, COPD, GERD and hypercholesterolemia who comes in today for a scheduled follow up.  Was admitted 02/05/14 with CVA.  See hospital records for details.  Went to rehab after her discharge. Now at home.  Has in home care from 9:00 am until 9:00pm.  She is accompanied by her son.  History obtained from both of them.  She is doing better.   Does have some short term memory loss.  Her son states this is improving, but still has issues with her memory.  Breathing stable.  No abdominal pain or cramping.  Bowels stable.  Getting up and down easier.  Not using her walker.  Appetite is good.  She is walking more.  Trying to watch her diet.  Able to feed and bathe herself.  Not using her gas stove.  She states she has more energy.  She does report some difficulty swallowing.  Has seen Dr Allen Norris previously.  Discussed referral back.      Past Medical History  Diagnosis Date  . Hypertension   . Hypercholesterolemia   . Hypothyroidism   . COPD (chronic obstructive pulmonary disease)   . GERD (gastroesophageal reflux disease)     with esophageal stricture requiring dilatation x 2 (12/96 and 10/99)  . Arthritis   . Glaucoma   . Peripheral neuropathy   . History of chicken pox   . Diverticulosis   . Migraines   . Colon polyps   . Urinary incontinence     Current Outpatient Prescriptions on File Prior to Visit  Medication Sig Dispense Refill  . amitriptyline (ELAVIL) 10 MG tablet TAKE 2 TABLETS BY MOUTH AT BEDTIME      . aspirin 325 MG tablet Take 325 mg by mouth daily.      Marland Kitchen atorvastatin (LIPITOR) 40 MG tablet Take 1 tablet (40 mg total) by mouth daily.  30 tablet  5  . bimatoprost (LUMIGAN) 0.03 % ophthalmic solution Place 1 drop into both eyes at bedtime.      . brinzolamide (AZOPT) 1 % ophthalmic suspension Place 1 drop into both eyes 2 (two) times  daily.       . Coenzyme Q10 (CO Q10) 100 MG CAPS Take 1 capsule by mouth daily.      . colesevelam (WELCHOL) 625 MG tablet Take 1,875 mg by mouth 2 (two) times daily with a meal.       . cyanocobalamin 2000 MCG tablet Take 2,000 mcg by mouth daily.      Marland Kitchen esomeprazole (NEXIUM) 40 MG capsule Take 1 capsule (40 mg total) by mouth daily at 12 noon.  30 capsule  5  . fluticasone (FLONASE) 50 MCG/ACT nasal spray PLACE 2 SPRAYS INTO THE NOSE DAILY.  16 g  2  . irbesartan (AVAPRO) 150 MG tablet TAKE 1 TABLET BY MOUTH EVERY DAY  90 tablet  1  . irbesartan (AVAPRO) 150 MG tablet TAKE 1 TABLET BY MOUTH EVERY DAY  90 tablet  1  . levothyroxine (SYNTHROID) 100 MCG tablet Take 1 tablet (100 mcg total) by mouth daily before breakfast.  90 tablet  1  . NAMENDA 10 MG tablet TAKE 1 TABLET BY MOUTH TWICE A DAY  180 tablet  1  . PROAIR HFA 108 (90 BASE) MCG/ACT inhaler INHALE 2 PUFFS INTO THE LUNGS EVERY 6 (SIX) HOURS  AS NEEDED FOR WHEEZING OR SHORTNESS OF BREATH.  8.5 each  0  . psyllium (METAMUCIL) 0.52 G capsule 4 capsules bid      . ranitidine (ZANTAC) 300 MG tablet Take 1 tablet (300 mg total) by mouth daily.  30 tablet  5   No current facility-administered medications on file prior to visit.    Review of Systems Patient denies any headache, lightheadedness or dizziness.  No sinus or allergy symptoms.  No chest pain, tightness or palpitations.  Son and pt describe sob with exertion.  No cough or congestion.  No nausea or vomiting.  Some swallowing difficulty as outlined.  No abdominal pain or cramping.  No bowel change, such as constipation, BRBPR or melana.   No urine change.   Eating and drinking well.  Short term memory loss.  Son concerned regarding her memory.  Still needs her sitters.  Also some increased depression.  Asking to increase zoloft.        Objective:   Physical Exam  Filed Vitals:   07/09/14 1616  BP: 122/70  Pulse: 89  Temp: 98.6 F (37 C)   Blood pressure recheck:  82/67  80  year old female in no acute distress.   HEENT:  Nares- clear.  Oropharynx - without lesions. NECK:  Supple.  Nontender.  No audible bruit.  HEART:  Appears to be regular. LUNGS:  No crackles or wheezing audible.  Respirations even and unlabored.  RADIAL PULSE:  Equal bilaterally.  ABDOMEN:  Soft, nontender.  Bowel sounds present and normal.  No audible abdominal bruit.  EXTREMITIES:  No increased edema present.  DP pulses palpable and equal bilaterally.           Assessment & Plan:  MSK.  Seeing Smith [facility].  Previous MRI - spinal stenosis and degenerative changes. Stable.    CARDIOVASCULAR.  Has seen Dr Ubaldo Glassing.  Stress test 06/09/11 negative for ischemia.  ECHO 06/09/11 revealed mild LVH, EF 60%, mild TR, mild pulmonary hypertension and mild diastolic dysfunction.  He ordered PFTs.  Revealed some obstruction and restriction with decreased diffusion capacity.  Currently stable.     HEALTH MAINTENANCE.  Physical 03/17/13.   Colonoscopy 06/05/10 with polyp removed and diverticulosis.  Mammogram 04/04/13 - Birads II.  Need to reschedule her physical when she is agreeable.  Also needs mammogram scheduled.    I spent over 40 minutes with the patient and more than 50% of the time was spent in consultation regarding the above.

## 2014-07-14 NOTE — Assessment & Plan Note (Signed)
Low carb diet.  Follow met b and a1c.   

## 2014-07-14 NOTE — Assessment & Plan Note (Signed)
Blood pressure doing well.  Same medication regimen.  Follow.  Follow metabolic panel.

## 2014-07-14 NOTE — Assessment & Plan Note (Signed)
Low cholesterol diet.  Check lipid panel with next labs.  On atorvastatin.  Follow.

## 2014-07-14 NOTE — Assessment & Plan Note (Signed)
EGD as outlined.  Has seen Dr Allen Norris for evaluation.  On dexilant and zantac.   Feeling as if food is getting stuck.  Will refer back to Dr Allen Norris.

## 2014-07-14 NOTE — Assessment & Plan Note (Signed)
On zoloft.   Some increased depression related to recent stroke and limitations.  Feels needs to increase zoloft.  Will change to 25mg  (three tablets per day).  Follow.

## 2014-07-14 NOTE — Assessment & Plan Note (Signed)
On thyroid replacement.  Follow tsh.  

## 2014-07-14 NOTE — Assessment & Plan Note (Signed)
Recent admission for CVA.  Short term memory loss.  Continue on daily aspirin.  Saw neurology.  Refer to his note for details.  Continue sitters.  Follow.

## 2014-07-14 NOTE — Assessment & Plan Note (Signed)
Per pt appears to be relatively stable.  F/u with GI as planned.  Want to make sure this not contributing to symptoms.  Follow.

## 2014-07-21 ENCOUNTER — Other Ambulatory Visit: Payer: Self-pay | Admitting: Internal Medicine

## 2014-07-26 DIAGNOSIS — D0430 Carcinoma in situ of skin of unspecified part of face: Secondary | ICD-10-CM | POA: Diagnosis not present

## 2014-07-26 DIAGNOSIS — D0439 Carcinoma in situ of skin of other parts of face: Secondary | ICD-10-CM | POA: Diagnosis not present

## 2014-07-26 DIAGNOSIS — C44320 Squamous cell carcinoma of skin of unspecified parts of face: Secondary | ICD-10-CM | POA: Diagnosis not present

## 2014-08-06 ENCOUNTER — Telehealth: Payer: Self-pay | Admitting: *Deleted

## 2014-08-06 ENCOUNTER — Telehealth: Payer: Self-pay | Admitting: Internal Medicine

## 2014-08-06 NOTE — Telephone Encounter (Signed)
Pt came by and dropped of an application for disability parking placard. Placed in Dr. Bary Leriche box.

## 2014-08-06 NOTE — Telephone Encounter (Signed)
Left message informing pt that form has been completed. Need to know if she wants it mailed or if she prefers to pick it up.

## 2014-08-07 NOTE — Telephone Encounter (Signed)
Handicap form has been mailed to patient

## 2014-08-16 NOTE — Telephone Encounter (Signed)
Opened in error

## 2014-08-20 ENCOUNTER — Ambulatory Visit (INDEPENDENT_AMBULATORY_CARE_PROVIDER_SITE_OTHER): Payer: Medicare Other | Admitting: Internal Medicine

## 2014-08-20 ENCOUNTER — Encounter: Payer: Self-pay | Admitting: Internal Medicine

## 2014-08-20 VITALS — BP 130/70 | HR 92 | Temp 98.6°F | Ht 62.0 in | Wt 167.5 lb

## 2014-08-20 DIAGNOSIS — E669 Obesity, unspecified: Secondary | ICD-10-CM

## 2014-08-20 DIAGNOSIS — K219 Gastro-esophageal reflux disease without esophagitis: Secondary | ICD-10-CM

## 2014-08-20 DIAGNOSIS — R269 Unspecified abnormalities of gait and mobility: Secondary | ICD-10-CM | POA: Diagnosis not present

## 2014-08-20 DIAGNOSIS — R7309 Other abnormal glucose: Secondary | ICD-10-CM

## 2014-08-20 DIAGNOSIS — I639 Cerebral infarction, unspecified: Secondary | ICD-10-CM

## 2014-08-20 DIAGNOSIS — R1310 Dysphagia, unspecified: Secondary | ICD-10-CM | POA: Diagnosis not present

## 2014-08-20 DIAGNOSIS — E039 Hypothyroidism, unspecified: Secondary | ICD-10-CM

## 2014-08-20 DIAGNOSIS — I6350 Cerebral infarction due to unspecified occlusion or stenosis of unspecified cerebral artery: Secondary | ICD-10-CM | POA: Diagnosis not present

## 2014-08-20 DIAGNOSIS — F32A Depression, unspecified: Secondary | ICD-10-CM

## 2014-08-20 DIAGNOSIS — E7800 Pure hypercholesterolemia, unspecified: Secondary | ICD-10-CM

## 2014-08-20 DIAGNOSIS — I10 Essential (primary) hypertension: Secondary | ICD-10-CM

## 2014-08-20 DIAGNOSIS — F329 Major depressive disorder, single episode, unspecified: Secondary | ICD-10-CM

## 2014-08-20 DIAGNOSIS — R2681 Unsteadiness on feet: Secondary | ICD-10-CM

## 2014-08-20 DIAGNOSIS — F3289 Other specified depressive episodes: Secondary | ICD-10-CM

## 2014-08-20 DIAGNOSIS — R739 Hyperglycemia, unspecified: Secondary | ICD-10-CM

## 2014-08-20 NOTE — Progress Notes (Signed)
Pre visit review using our clinic review tool, if applicable. No additional management support is needed unless otherwise documented below in the visit note. 

## 2014-08-22 ENCOUNTER — Telehealth: Payer: Self-pay | Admitting: *Deleted

## 2014-08-22 ENCOUNTER — Encounter: Payer: Self-pay | Admitting: Internal Medicine

## 2014-08-22 DIAGNOSIS — R2681 Unsteadiness on feet: Secondary | ICD-10-CM | POA: Insufficient documentation

## 2014-08-22 DIAGNOSIS — E669 Obesity, unspecified: Secondary | ICD-10-CM | POA: Insufficient documentation

## 2014-08-22 NOTE — Assessment & Plan Note (Signed)
Low cholesterol diet.  On atorvastatin.  Follow lipid panel and liver function.    

## 2014-08-22 NOTE — Telephone Encounter (Signed)
Sandra Kaufman, can we go ahead and schedule this appt with Dr Allen Norris - [facility].  She has seen him previously.  Having swallowing issues.

## 2014-08-22 NOTE — Assessment & Plan Note (Signed)
Blood pressure doing well.  Same medication regimen.  Follow.  Follow metabolic panel.

## 2014-08-22 NOTE — Assessment & Plan Note (Signed)
On thyroid replacement.  Follow tsh.  

## 2014-08-22 NOTE — Assessment & Plan Note (Signed)
EGD as outlined.  Has seen Dr Allen Norris for evaluation.  On dexilant and zantac.   Feeling as if food is getting stuck.  Will refer back to Dr Allen Norris.

## 2014-08-22 NOTE — Telephone Encounter (Signed)
Pt called to advised Dr Nicki Reaper that yesterday while eating dinner her throat closed up again.  She states she was advised by MD to report when it happened again.  Please advise

## 2014-08-22 NOTE — Assessment & Plan Note (Signed)
Recent fall.  Feels unsteady.  Agreed to home physical therapy for further evaluation and treatment.  Referral made.

## 2014-08-22 NOTE — Progress Notes (Signed)
Subjective:    Patient ID: Sandra Kaufman, female    DOB: 1932/10/04, 80 y.o.   MRN: [id number]  HPI 80 year old female with past history of hypertension, COPD, GERD and hypercholesterolemia who comes in today for a scheduled follow up.  Was admitted 02/05/14 with CVA.  See hospital records for details.  Went to rehab after her discharge. Now at home.  Has in home care from 9:00 am until 9:00pm.  She is accompanied by her son.  History obtained from both of them.  She is doing better.   Does have some short term memory loss.  Her son states this is improving, but still has issues with her memory.  Breathing stable.  No abdominal pain or cramping.  Bowels stable.   Appetite is good.  She does report some difficulty swallowing.  Has seen Dr Allen Norris previously.  Having increased issues recently.  No bad episode in two weeks, but prior to this - was occurring more regularly.  She is trying to eat slowly, chew food well and take small bites.  She did fall last week.  Lost her balance.  Hit her head.  Reports no residual headache, light headedness or dizziness.  Does feel unsteady at times.  We discussed physical therapy to help with gait training and balance.      Past Medical History  Diagnosis Date  . Hypertension   . Hypercholesterolemia   . Hypothyroidism   . COPD (chronic obstructive pulmonary disease)   . GERD (gastroesophageal reflux disease)     with esophageal stricture requiring dilatation x 2 (12/96 and 10/99)  . Arthritis   . Glaucoma   . Peripheral neuropathy   . History of chicken pox   . Diverticulosis   . Migraines   . Colon polyps   . Urinary incontinence     Current Outpatient Prescriptions on File Prior to Visit  Medication Sig Dispense Refill  . amitriptyline (ELAVIL) 10 MG tablet TAKE 2 TABLETS BY MOUTH AT BEDTIME      . amitriptyline (ELAVIL) 10 MG tablet TAKE 2 TABLETS BY MOUTH AT BEDTIME  60 tablet  2  . amitriptyline (ELAVIL) 10 MG tablet TAKE 2 TABLETS BY MOUTH AT  BEDTIME  60 tablet  5  . aspirin 325 MG tablet Take 325 mg by mouth daily.      Marland Kitchen atorvastatin (LIPITOR) 40 MG tablet Take 1 tablet (40 mg total) by mouth daily.  30 tablet  5  . bimatoprost (LUMIGAN) 0.03 % ophthalmic solution Place 1 drop into both eyes at bedtime.      . brinzolamide (AZOPT) 1 % ophthalmic suspension Place 1 drop into both eyes 2 (two) times daily.       . Coenzyme Q10 (CO Q10) 100 MG CAPS Take 1 capsule by mouth daily.      . colesevelam (WELCHOL) 625 MG tablet Take 1,875 mg by mouth 2 (two) times daily with a meal.       . cyanocobalamin 2000 MCG tablet Take 2,000 mcg by mouth daily.      Marland Kitchen esomeprazole (NEXIUM) 40 MG capsule Take 1 capsule (40 mg total) by mouth daily at 12 noon.  30 capsule  5  . fluticasone (FLONASE) 50 MCG/ACT nasal spray PLACE 2 SPRAYS INTO THE NOSE DAILY.  16 g  2  . fluticasone (FLOVENT HFA) 110 MCG/ACT inhaler Inhale 2 puffs into the lungs 2 (two) times daily.  1 Inhaler  2  . irbesartan (AVAPRO) 150 MG tablet  TAKE 1 TABLET BY MOUTH EVERY DAY  90 tablet  1  . irbesartan (AVAPRO) 150 MG tablet TAKE 1 TABLET BY MOUTH EVERY DAY  90 tablet  1  . levothyroxine (SYNTHROID) 100 MCG tablet Take 1 tablet (100 mcg total) by mouth daily before breakfast.  90 tablet  1  . NAMENDA 10 MG tablet TAKE 1 TABLET BY MOUTH TWICE A DAY  180 tablet  1  . PROAIR HFA 108 (90 BASE) MCG/ACT inhaler INHALE 2 PUFFS INTO THE LUNGS EVERY 6 (SIX) HOURS AS NEEDED FOR WHEEZING OR SHORTNESS OF BREATH.  8.5 each  0  . psyllium (METAMUCIL) 0.52 G capsule 4 capsules bid      . ranitidine (ZANTAC) 300 MG tablet Take 1 tablet (300 mg total) by mouth daily.  30 tablet  5  . sertraline (ZOLOFT) 25 MG tablet Take three tablets daily  90 tablet  2  . SYNTHROID 112 MCG tablet TAKE 1 TABLET (112 MCG TOTAL) BY MOUTH DAILY.  90 tablet  1   No current facility-administered medications on file prior to visit.    Review of Systems Patient denies any headache, lightheadedness or dizziness.  No  sinus or allergy symptoms.  No chest pain, tightness or palpitations.  No increased sob reported.  No cough or congestion.  No nausea or vomiting.  Some swallowing difficulty as outlined.  No abdominal pain or cramping.  No bowel change, such as constipation, BRBPR or melana.   No urine change. Eating and drinking well.  Short term memory loss.  Son concerned regarding her memory.  Still needs her sitters.  Doing better with depression with increased zoloft.  Fall last week as outlined.  Some balance issues.  Unsteady.        Objective:   Physical Exam  Filed Vitals:   08/20/14 1431  BP: 130/70  Pulse: 92  Temp: 98.6 F (37 C)   Blood pressure recheck:  124/72, pulse 13  80 year old female in no acute distress.   HEENT:  Nares- clear.  Oropharynx - without lesions. NECK:  Supple.  Nontender.  No audible bruit.  HEART:  Appears to be regular. LUNGS:  No crackles or wheezing audible.  Respirations even and unlabored.  RADIAL PULSE:  Equal bilaterally.  ABDOMEN:  Soft, nontender.  Bowel sounds present and normal.  No audible abdominal bruit.  EXTREMITIES:  No increased edema present.  DP pulses palpable and equal bilaterally.  NEURO:  Unsteady gait with ambulation and turning.            Assessment & Plan:  MSK.  Seeing Smith Robert.  Previous MRI - spinal stenosis and degenerative changes. Stable.    CARDIOVASCULAR.  Has seen Dr Ubaldo Glassing.  Stress test 06/09/11 negative for ischemia.  ECHO 06/09/11 revealed mild LVH, EF 60%, mild TR, mild pulmonary hypertension and mild diastolic dysfunction.  He ordered PFTs.  Revealed some obstruction and restriction with decreased diffusion capacity.  Currently stable.     HEALTH MAINTENANCE.  Physical 03/17/13.   Colonoscopy 06/05/10 with polyp removed and diverticulosis.  Mammogram 04/04/13 - Birads II.  Need to reschedule her physical.  Also needs mammogram scheduled.    I spent over 25 minutes with the patient and more than 50% of the time was spent in  consultation regarding the above.

## 2014-08-22 NOTE — Assessment & Plan Note (Signed)
Better on increased dose of zoloft.  Follow.

## 2014-08-22 NOTE — Telephone Encounter (Signed)
Spoke with pts son [name], he states pt is no longer having symptoms.  pts breathing is good.  Pt has not been scheduled with Dr Allen Norris yet.

## 2014-08-22 NOTE — Assessment & Plan Note (Signed)
Recent admission for CVA.  Short term memory loss.  Continue on daily aspirin.  Saw neurology.  Refer to his note for details.  Continue sitters.  Follow.

## 2014-08-22 NOTE — Assessment & Plan Note (Signed)
Low carb diet.  Follow met b and a1c.   

## 2014-08-22 NOTE — Assessment & Plan Note (Signed)
Have discussed diet adjustment.   

## 2014-08-22 NOTE — Assessment & Plan Note (Signed)
Has been having increased problems swallowing.  Instructed to take small bites.  Chew food well.  States feels like her food is getting stuck.  Will refer back to Dr Allen Norris for evaluation.

## 2014-08-22 NOTE — Telephone Encounter (Signed)
Is she having any symptoms now?  Breathing ok.  Needs to take small bites, eat slowly and chew food well.  Also, when is her appt with Dr Allen Norris.  Has that been scheduled yet?

## 2014-09-06 DIAGNOSIS — R1310 Dysphagia, unspecified: Secondary | ICD-10-CM | POA: Diagnosis not present

## 2014-09-10 ENCOUNTER — Other Ambulatory Visit: Payer: Medicare Other

## 2014-10-04 DIAGNOSIS — E538 Deficiency of other specified B group vitamins: Secondary | ICD-10-CM | POA: Diagnosis not present

## 2014-10-04 DIAGNOSIS — R296 Repeated falls: Secondary | ICD-10-CM | POA: Insufficient documentation

## 2014-10-04 DIAGNOSIS — I69393 Ataxia following cerebral infarction: Secondary | ICD-10-CM | POA: Diagnosis not present

## 2014-10-04 DIAGNOSIS — G119 Hereditary ataxia, unspecified: Secondary | ICD-10-CM | POA: Insufficient documentation

## 2014-10-04 DIAGNOSIS — R262 Difficulty in walking, not elsewhere classified: Secondary | ICD-10-CM | POA: Insufficient documentation

## 2014-10-04 DIAGNOSIS — Z8673 Personal history of transient ischemic attack (TIA), and cerebral infarction without residual deficits: Secondary | ICD-10-CM | POA: Diagnosis not present

## 2014-10-04 DIAGNOSIS — I639 Cerebral infarction, unspecified: Secondary | ICD-10-CM | POA: Diagnosis not present

## 2014-10-10 ENCOUNTER — Telehealth: Payer: Self-pay | Admitting: *Deleted

## 2014-10-10 NOTE — Telephone Encounter (Signed)
Spoke with pts son advised of MDs message

## 2014-10-10 NOTE — Telephone Encounter (Signed)
Left message on VM to return call 

## 2014-10-10 NOTE — Telephone Encounter (Signed)
She is seeing neurology.  They usually request the driving evaluations.  I would have him contact them to set this up.  She needs to not drive until the evaluation.  (need to take her keys and inform her that this is recommendation from me).

## 2014-10-10 NOTE — Telephone Encounter (Signed)
pts son called states [facility] has reported to him that pts driving has gotten more erratic, not stopping at stop signs, forgetting where she is going.  Pts son is requesting a letter be sent to [facility] for pt to have another driving test/evaluation.  Please advise

## 2014-10-22 ENCOUNTER — Ambulatory Visit: Payer: Medicare Other | Admitting: Internal Medicine

## 2014-10-22 DIAGNOSIS — Z0289 Encounter for other administrative examinations: Secondary | ICD-10-CM

## 2014-10-23 ENCOUNTER — Telehealth: Payer: Self-pay | Admitting: Internal Medicine

## 2014-10-23 ENCOUNTER — Encounter: Payer: Self-pay | Admitting: Internal Medicine

## 2014-10-23 NOTE — Telephone Encounter (Signed)
Is ok for the patient to cook on an electric stove.

## 2014-10-23 NOTE — Telephone Encounter (Signed)
I did not see anything in pts chart to suggest that she could not.  Please advise

## 2014-10-23 NOTE — Telephone Encounter (Signed)
I sent pt a my chart message with response.  She had a recent stroke.  Has had some difficulty at home since.  I sent her a my chart message and informed her that I would reevaluate her at her next appt and we would discuss this more.

## 2014-10-24 ENCOUNTER — Other Ambulatory Visit: Payer: Self-pay | Admitting: Internal Medicine

## 2014-10-25 ENCOUNTER — Ambulatory Visit: Payer: Self-pay | Admitting: Gastroenterology

## 2014-10-25 DIAGNOSIS — F329 Major depressive disorder, single episode, unspecified: Secondary | ICD-10-CM | POA: Diagnosis not present

## 2014-10-25 DIAGNOSIS — Z7982 Long term (current) use of aspirin: Secondary | ICD-10-CM | POA: Diagnosis not present

## 2014-10-25 DIAGNOSIS — K222 Esophageal obstruction: Secondary | ICD-10-CM | POA: Diagnosis not present

## 2014-10-25 DIAGNOSIS — Z888 Allergy status to other drugs, medicaments and biological substances status: Secondary | ICD-10-CM | POA: Diagnosis not present

## 2014-10-25 DIAGNOSIS — M19042 Primary osteoarthritis, left hand: Secondary | ICD-10-CM | POA: Diagnosis not present

## 2014-10-25 DIAGNOSIS — G629 Polyneuropathy, unspecified: Secondary | ICD-10-CM | POA: Diagnosis not present

## 2014-10-25 DIAGNOSIS — M19041 Primary osteoarthritis, right hand: Secondary | ICD-10-CM | POA: Diagnosis not present

## 2014-10-25 DIAGNOSIS — J449 Chronic obstructive pulmonary disease, unspecified: Secondary | ICD-10-CM | POA: Diagnosis not present

## 2014-10-25 DIAGNOSIS — I10 Essential (primary) hypertension: Secondary | ICD-10-CM | POA: Diagnosis not present

## 2014-10-25 DIAGNOSIS — K449 Diaphragmatic hernia without obstruction or gangrene: Secondary | ICD-10-CM | POA: Diagnosis not present

## 2014-10-25 DIAGNOSIS — Z8673 Personal history of transient ischemic attack (TIA), and cerebral infarction without residual deficits: Secondary | ICD-10-CM | POA: Diagnosis not present

## 2014-10-25 DIAGNOSIS — Z79899 Other long term (current) drug therapy: Secondary | ICD-10-CM | POA: Diagnosis not present

## 2014-10-25 DIAGNOSIS — M19072 Primary osteoarthritis, left ankle and foot: Secondary | ICD-10-CM | POA: Diagnosis not present

## 2014-10-25 DIAGNOSIS — K219 Gastro-esophageal reflux disease without esophagitis: Secondary | ICD-10-CM | POA: Diagnosis not present

## 2014-10-25 DIAGNOSIS — R1310 Dysphagia, unspecified: Secondary | ICD-10-CM | POA: Diagnosis not present

## 2014-10-25 DIAGNOSIS — R05 Cough: Secondary | ICD-10-CM | POA: Diagnosis not present

## 2014-10-25 DIAGNOSIS — M19071 Primary osteoarthritis, right ankle and foot: Secondary | ICD-10-CM | POA: Diagnosis not present

## 2014-11-28 ENCOUNTER — Ambulatory Visit: Payer: Medicare Other | Admitting: Internal Medicine

## 2014-11-28 ENCOUNTER — Other Ambulatory Visit: Payer: Self-pay | Admitting: Internal Medicine

## 2014-12-10 ENCOUNTER — Encounter: Payer: Self-pay | Admitting: Internal Medicine

## 2014-12-17 DIAGNOSIS — Z8673 Personal history of transient ischemic attack (TIA), and cerebral infarction without residual deficits: Secondary | ICD-10-CM | POA: Diagnosis not present

## 2014-12-17 DIAGNOSIS — R262 Difficulty in walking, not elsewhere classified: Secondary | ICD-10-CM | POA: Diagnosis not present

## 2014-12-17 DIAGNOSIS — I639 Cerebral infarction, unspecified: Secondary | ICD-10-CM | POA: Diagnosis not present

## 2014-12-17 DIAGNOSIS — I69393 Ataxia following cerebral infarction: Secondary | ICD-10-CM | POA: Diagnosis not present

## 2014-12-26 DIAGNOSIS — I69393 Ataxia following cerebral infarction: Secondary | ICD-10-CM | POA: Diagnosis not present

## 2014-12-26 DIAGNOSIS — I10 Essential (primary) hypertension: Secondary | ICD-10-CM | POA: Diagnosis not present

## 2014-12-26 DIAGNOSIS — I6931 Cognitive deficits following cerebral infarction: Secondary | ICD-10-CM | POA: Diagnosis not present

## 2014-12-26 DIAGNOSIS — Z9181 History of falling: Secondary | ICD-10-CM | POA: Diagnosis not present

## 2014-12-26 DIAGNOSIS — J449 Chronic obstructive pulmonary disease, unspecified: Secondary | ICD-10-CM | POA: Diagnosis not present

## 2014-12-26 DIAGNOSIS — M129 Arthropathy, unspecified: Secondary | ICD-10-CM | POA: Diagnosis not present

## 2014-12-27 ENCOUNTER — Encounter: Payer: Self-pay | Admitting: *Deleted

## 2015-01-01 DIAGNOSIS — I69393 Ataxia following cerebral infarction: Secondary | ICD-10-CM | POA: Diagnosis not present

## 2015-01-01 DIAGNOSIS — I10 Essential (primary) hypertension: Secondary | ICD-10-CM | POA: Diagnosis not present

## 2015-01-01 DIAGNOSIS — M129 Arthropathy, unspecified: Secondary | ICD-10-CM | POA: Diagnosis not present

## 2015-01-01 DIAGNOSIS — J449 Chronic obstructive pulmonary disease, unspecified: Secondary | ICD-10-CM | POA: Diagnosis not present

## 2015-01-01 DIAGNOSIS — Z9181 History of falling: Secondary | ICD-10-CM | POA: Diagnosis not present

## 2015-01-01 DIAGNOSIS — I6931 Cognitive deficits following cerebral infarction: Secondary | ICD-10-CM | POA: Diagnosis not present

## 2015-01-03 DIAGNOSIS — Z9181 History of falling: Secondary | ICD-10-CM | POA: Diagnosis not present

## 2015-01-03 DIAGNOSIS — M129 Arthropathy, unspecified: Secondary | ICD-10-CM | POA: Diagnosis not present

## 2015-01-03 DIAGNOSIS — I6931 Cognitive deficits following cerebral infarction: Secondary | ICD-10-CM | POA: Diagnosis not present

## 2015-01-03 DIAGNOSIS — I10 Essential (primary) hypertension: Secondary | ICD-10-CM | POA: Diagnosis not present

## 2015-01-03 DIAGNOSIS — I69393 Ataxia following cerebral infarction: Secondary | ICD-10-CM | POA: Diagnosis not present

## 2015-01-03 DIAGNOSIS — J449 Chronic obstructive pulmonary disease, unspecified: Secondary | ICD-10-CM | POA: Diagnosis not present

## 2015-01-10 NOTE — Telephone Encounter (Signed)
Mailed unread message to pt  

## 2015-01-15 DIAGNOSIS — I10 Essential (primary) hypertension: Secondary | ICD-10-CM | POA: Diagnosis not present

## 2015-01-15 DIAGNOSIS — J449 Chronic obstructive pulmonary disease, unspecified: Secondary | ICD-10-CM | POA: Diagnosis not present

## 2015-01-15 DIAGNOSIS — I69393 Ataxia following cerebral infarction: Secondary | ICD-10-CM | POA: Diagnosis not present

## 2015-01-15 DIAGNOSIS — Z9181 History of falling: Secondary | ICD-10-CM | POA: Diagnosis not present

## 2015-01-15 DIAGNOSIS — I6931 Cognitive deficits following cerebral infarction: Secondary | ICD-10-CM | POA: Diagnosis not present

## 2015-01-15 DIAGNOSIS — M129 Arthropathy, unspecified: Secondary | ICD-10-CM | POA: Diagnosis not present

## 2015-01-22 DIAGNOSIS — Z9181 History of falling: Secondary | ICD-10-CM | POA: Diagnosis not present

## 2015-01-22 DIAGNOSIS — I10 Essential (primary) hypertension: Secondary | ICD-10-CM | POA: Diagnosis not present

## 2015-01-22 DIAGNOSIS — I69393 Ataxia following cerebral infarction: Secondary | ICD-10-CM | POA: Diagnosis not present

## 2015-01-22 DIAGNOSIS — J449 Chronic obstructive pulmonary disease, unspecified: Secondary | ICD-10-CM | POA: Diagnosis not present

## 2015-01-22 DIAGNOSIS — I6931 Cognitive deficits following cerebral infarction: Secondary | ICD-10-CM | POA: Diagnosis not present

## 2015-01-22 DIAGNOSIS — M129 Arthropathy, unspecified: Secondary | ICD-10-CM | POA: Diagnosis not present

## 2015-01-24 DIAGNOSIS — H4011X3 Primary open-angle glaucoma, severe stage: Secondary | ICD-10-CM | POA: Diagnosis not present

## 2015-01-31 DIAGNOSIS — L853 Xerosis cutis: Secondary | ICD-10-CM | POA: Diagnosis not present

## 2015-01-31 DIAGNOSIS — Z08 Encounter for follow-up examination after completed treatment for malignant neoplasm: Secondary | ICD-10-CM | POA: Diagnosis not present

## 2015-01-31 DIAGNOSIS — Z1283 Encounter for screening for malignant neoplasm of skin: Secondary | ICD-10-CM | POA: Diagnosis not present

## 2015-01-31 DIAGNOSIS — Z872 Personal history of diseases of the skin and subcutaneous tissue: Secondary | ICD-10-CM | POA: Diagnosis not present

## 2015-01-31 DIAGNOSIS — Z09 Encounter for follow-up examination after completed treatment for conditions other than malignant neoplasm: Secondary | ICD-10-CM | POA: Diagnosis not present

## 2015-01-31 DIAGNOSIS — Z85828 Personal history of other malignant neoplasm of skin: Secondary | ICD-10-CM | POA: Diagnosis not present

## 2015-02-02 ENCOUNTER — Other Ambulatory Visit: Payer: Self-pay | Admitting: Internal Medicine

## 2015-02-12 ENCOUNTER — Other Ambulatory Visit: Payer: Self-pay | Admitting: Internal Medicine

## 2015-02-12 NOTE — Telephone Encounter (Signed)
Last OV 8.24.15.  Several No Shows.  Please advise refill.

## 2015-02-13 NOTE — Telephone Encounter (Signed)
Refilled zoloft - #90 with one refill.  

## 2015-02-14 ENCOUNTER — Ambulatory Visit (INDEPENDENT_AMBULATORY_CARE_PROVIDER_SITE_OTHER): Payer: Medicare Other | Admitting: Internal Medicine

## 2015-02-14 ENCOUNTER — Encounter: Payer: Self-pay | Admitting: Internal Medicine

## 2015-02-14 VITALS — BP 100/70 | HR 77 | Temp 98.1°F | Ht 62.0 in | Wt 174.4 lb

## 2015-02-14 DIAGNOSIS — I639 Cerebral infarction, unspecified: Secondary | ICD-10-CM | POA: Diagnosis not present

## 2015-02-14 DIAGNOSIS — K219 Gastro-esophageal reflux disease without esophagitis: Secondary | ICD-10-CM | POA: Diagnosis not present

## 2015-02-14 DIAGNOSIS — E039 Hypothyroidism, unspecified: Secondary | ICD-10-CM | POA: Diagnosis not present

## 2015-02-14 DIAGNOSIS — E669 Obesity, unspecified: Secondary | ICD-10-CM

## 2015-02-14 DIAGNOSIS — E7800 Pure hypercholesterolemia, unspecified: Secondary | ICD-10-CM

## 2015-02-14 DIAGNOSIS — F32A Depression, unspecified: Secondary | ICD-10-CM

## 2015-02-14 DIAGNOSIS — Z1239 Encounter for other screening for malignant neoplasm of breast: Secondary | ICD-10-CM | POA: Diagnosis not present

## 2015-02-14 DIAGNOSIS — F329 Major depressive disorder, single episode, unspecified: Secondary | ICD-10-CM

## 2015-02-14 DIAGNOSIS — I10 Essential (primary) hypertension: Secondary | ICD-10-CM

## 2015-02-14 DIAGNOSIS — R1310 Dysphagia, unspecified: Secondary | ICD-10-CM

## 2015-02-14 DIAGNOSIS — R739 Hyperglycemia, unspecified: Secondary | ICD-10-CM

## 2015-02-14 NOTE — Progress Notes (Signed)
Pre visit review using our clinic review tool, if applicable. No additional management support is needed unless otherwise documented below in the visit note. 

## 2015-02-17 ENCOUNTER — Encounter: Payer: Self-pay | Admitting: Internal Medicine

## 2015-02-17 NOTE — Assessment & Plan Note (Signed)
Just had EGD.  Sees Dr Allen Norris.  On dexilant and zantac.  Swallowing better.  Acid controlled.  Follow.

## 2015-02-17 NOTE — Assessment & Plan Note (Signed)
Blood pressure is doing well.  Same medication regimen.  Follow pressures.  Check metabolic panel.

## 2015-02-17 NOTE — Assessment & Plan Note (Signed)
Low carb diet and exercise.  Follow met b and a1c.  

## 2015-02-17 NOTE — Assessment & Plan Note (Signed)
Doing well on zoloft.  Follow.   

## 2015-02-17 NOTE — Assessment & Plan Note (Signed)
Diet and exercise.   

## 2015-02-17 NOTE — Assessment & Plan Note (Signed)
Continue on daily aspirin.  Seeing neurology.  Refer to his note for details.  Has sitters.  Follow.

## 2015-02-17 NOTE — Assessment & Plan Note (Signed)
Low cholesterol diet and exercise.  Follow lipid profile and liver panel.  On lipitor.

## 2015-02-17 NOTE — Assessment & Plan Note (Signed)
EGD as outlined.  S/p dilatation.  Swallowing better.  Follow.

## 2015-02-17 NOTE — Progress Notes (Signed)
Patient ID: Sandra Kaufman, female   DOB: [birth year], 81 y.o.   MRN: [id number]   Subjective:    Patient ID: Sandra Kaufman, female    DOB: 1932/08/11, 81 y.o.   MRN: [id number]  HPI  Patient here for a scheduled follow up.  She is accompanied by her caretaker.  States she is doing better.  Feels better.  Reports getting around her house without problems.  She saw GI.  Had EGD.  Found to have stricture.  S/p dilatation.  Swallowing better.  Tries to stay active.  No cardiac symptoms with increased activity or exertion.  Breathing stable.  Bowels stable.     Past Medical History  Diagnosis Date  . Hypertension   . Hypercholesterolemia   . Hypothyroidism   . COPD (chronic obstructive pulmonary disease)   . GERD (gastroesophageal reflux disease)     with esophageal stricture requiring dilatation x 2 (12/96 and 10/99)  . Arthritis   . Glaucoma   . Peripheral neuropathy   . History of chicken pox   . Diverticulosis   . Migraines   . Colon polyps   . Urinary incontinence     Current Outpatient Prescriptions on File Prior to Visit  Medication Sig Dispense Refill  . amitriptyline (ELAVIL) 10 MG tablet Take 2 tablets (20 mg total) by mouth at bedtime. MUST KEEP APPT ON 02/14/15 FOR FURTHER REFILLS 60 tablet 0  . aspirin 325 MG tablet Take 325 mg by mouth daily.    Marland Kitchen atorvastatin (LIPITOR) 40 MG tablet TAKE 1 TABLET (40 MG TOTAL) BY MOUTH DAILY. 30 tablet 5  . bimatoprost (LUMIGAN) 0.03 % ophthalmic solution Place 1 drop into both eyes at bedtime.    . brinzolamide (AZOPT) 1 % ophthalmic suspension Place 1 drop into both eyes 2 (two) times daily.     . Coenzyme Q10 (CO Q10) 100 MG CAPS Take 1 capsule by mouth daily.    . colesevelam (WELCHOL) 625 MG tablet Take 1,875 mg by mouth 2 (two) times daily with a meal.     . cyanocobalamin 2000 MCG tablet Take 2,000 mcg by mouth daily.    Marland Kitchen esomeprazole (NEXIUM) 40 MG capsule Take 1 capsule (40 mg total) by mouth daily at 12 noon. 30 capsule 5  .  fluticasone (FLONASE) 50 MCG/ACT nasal spray PLACE 2 SPRAYS INTO THE NOSE DAILY. 16 g 2  . fluticasone (FLOVENT HFA) 110 MCG/ACT inhaler Inhale 2 puffs into the lungs 2 (two) times daily. 1 Inhaler 2  . irbesartan (AVAPRO) 150 MG tablet TAKE 1 TABLET BY MOUTH EVERY DAY 90 tablet 1  . irbesartan (AVAPRO) 150 MG tablet TAKE 1 TABLET BY MOUTH EVERY DAY 90 tablet 1  . levothyroxine (SYNTHROID) 100 MCG tablet Take 1 tablet (100 mcg total) by mouth daily before breakfast. 90 tablet 1  . NAMENDA 10 MG tablet TAKE 1 TABLET BY MOUTH TWICE A DAY 180 tablet 1  . PROAIR HFA 108 (90 BASE) MCG/ACT inhaler INHALE 2 PUFFS INTO THE LUNGS EVERY 6 (SIX) HOURS AS NEEDED FOR WHEEZING OR SHORTNESS OF BREATH. 8.5 each 0  . psyllium (METAMUCIL) 0.52 G capsule 4 capsules bid    . ranitidine (ZANTAC) 300 MG tablet Take 1 tablet (300 mg total) by mouth daily. 30 tablet 5  . sertraline (ZOLOFT) 25 MG tablet TAKE THREE TABLETS DAILY 90 tablet 1  . SYNTHROID 112 MCG tablet TAKE 1 TABLET (112 MCG TOTAL) BY MOUTH DAILY. 90 tablet 1   No current facility-administered  medications on file prior to visit.    Review of Systems  Constitutional: Negative for appetite change and unexpected weight change.  HENT: Negative for congestion and sinus pressure.   Respiratory: Negative for cough, chest tightness and shortness of breath.   Cardiovascular: Negative for chest pain, palpitations and leg swelling.  Gastrointestinal: Negative for nausea, vomiting, abdominal pain and diarrhea.  Genitourinary: Negative for dysuria and difficulty urinating.  Musculoskeletal: Negative for back pain and joint swelling.  Skin: Negative for color change and rash.  Neurological: Negative for dizziness, light-headedness and headaches.  Psychiatric/Behavioral: Negative for behavioral problems and dysphoric mood.       Objective:     Blood pressure recheck:  120/72, pulse 80  Physical Exam  Constitutional: She appears well-developed and  well-nourished. No distress.  HENT:  Nose: Nose normal.  Mouth/Throat: Oropharynx is clear and moist.  Neck: Neck supple. No thyromegaly present.  Cardiovascular: Normal rate and regular rhythm.   Pulmonary/Chest: Breath sounds normal. No respiratory distress. She has no wheezes.  Abdominal: Soft. Bowel sounds are normal. There is no tenderness.  Musculoskeletal: She exhibits no edema or tenderness.  Lymphadenopathy:    She has no cervical adenopathy.  Skin: No rash noted. No erythema.    BP 100/70 mmHg  Pulse 77  Temp(Src) 98.1 F (36.7 C) (Oral)  Ht 5' 2"  (1.575 m)  Wt 174 lb 6 oz (79.096 kg)  BMI 31.89 kg/m2  SpO2 99% Wt Readings from Last 3 Encounters:  02/14/15 174 lb 6 oz (79.096 kg)  08/20/14 167 lb 8 oz (75.978 kg)  07/09/14 167 lb 8 oz (75.978 kg)     Lab Results  Component Value Date   WBC 5.6 06/11/2014   HGB 10.7* 06/11/2014   HCT 32.9* 06/11/2014   PLT 245.0 06/11/2014   GLUCOSE 128* 05/09/2014   CHOL 120 06/11/2014   TRIG 131.0 06/11/2014   HDL 41.80 06/11/2014   LDLDIRECT 183.4 08/31/2013   LDLCALC 52 06/11/2014   ALT 15 05/09/2014   AST 13 05/09/2014   NA 139 05/09/2014   K 3.9 05/09/2014   CL 107 05/09/2014   CREATININE 0.8 05/09/2014   BUN 16 05/09/2014   CO2 25 05/09/2014   TSH 0.15* 06/11/2014   HGBA1C 6.9* 05/09/2014   MICROALBUR 0.5 01/04/2014       Assessment & Plan:   Problem List Items Addressed This Visit    CVA (cerebral vascular accident)    Continue on daily aspirin.  Seeing neurology.  Refer to his note for details.  Has sitters.  Follow.       Depression    Doing well on zoloft.  Follow.        Dysphagia    EGD as outlined.  S/p dilatation.  Swallowing better.  Follow.        GERD (gastroesophageal reflux disease)    Just had EGD.  Sees Dr Allen Norris.  On dexilant and zantac.  Swallowing better.  Acid controlled.  Follow.        Hypercholesterolemia    Low cholesterol diet and exercise.  Follow lipid profile and liver  panel.  On lipitor.        Hyperglycemia    Low carb diet and exercise.  Follow met b and a1c.       Hypertension    Blood pressure is doing well.  Same medication regimen.  Follow pressures.  Check metabolic panel.        Hypothyroidism    On thyroid  replacement.  Follow tsh.        Obesity    Diet and exercise.         Other Visit Diagnoses    Breast cancer screening    -  Primary    Relevant Orders    MM DIGITAL SCREENING BILATERAL        Einar Pheasant, MD

## 2015-02-17 NOTE — Assessment & Plan Note (Signed)
On thyroid replacement.  Follow tsh.  

## 2015-02-21 ENCOUNTER — Other Ambulatory Visit: Payer: Self-pay | Admitting: Internal Medicine

## 2015-02-26 ENCOUNTER — Ambulatory Visit: Payer: Self-pay | Admitting: Internal Medicine

## 2015-02-26 DIAGNOSIS — Z1231 Encounter for screening mammogram for malignant neoplasm of breast: Secondary | ICD-10-CM | POA: Diagnosis not present

## 2015-02-26 LAB — HM MAMMOGRAPHY: HM Mammogram: NEGATIVE

## 2015-02-28 ENCOUNTER — Other Ambulatory Visit (INDEPENDENT_AMBULATORY_CARE_PROVIDER_SITE_OTHER): Payer: Medicare Other

## 2015-02-28 DIAGNOSIS — R739 Hyperglycemia, unspecified: Secondary | ICD-10-CM | POA: Diagnosis not present

## 2015-02-28 DIAGNOSIS — D649 Anemia, unspecified: Secondary | ICD-10-CM

## 2015-02-28 DIAGNOSIS — E7800 Pure hypercholesterolemia, unspecified: Secondary | ICD-10-CM

## 2015-02-28 DIAGNOSIS — E039 Hypothyroidism, unspecified: Secondary | ICD-10-CM

## 2015-02-28 DIAGNOSIS — I10 Essential (primary) hypertension: Secondary | ICD-10-CM

## 2015-02-28 LAB — LIPID PANEL
Cholesterol: 137 mg/dL (ref 0–200)
HDL: 51.9 mg/dL (ref >39.00)
LDL Cholesterol: 67 mg/dL (ref 0–99)
NonHDL: 85.1
Total CHOL/HDL Ratio: 3
Triglycerides: 90 mg/dL (ref 0.0–149.0)
VLDL: 18 mg/dL (ref 0.0–40.0)

## 2015-02-28 LAB — HEPATIC FUNCTION PANEL
ALT: 33 U/L (ref 0–35)
AST: 29 U/L (ref 0–37)
Albumin: 3.9 g/dL (ref 3.5–5.2)
Alkaline Phosphatase: 90 U/L (ref 39–117)
Bilirubin, Direct: 0.1 mg/dL (ref 0.0–0.3)
Total Bilirubin: 0.6 mg/dL (ref 0.2–1.2)
Total Protein: 6.9 g/dL (ref 6.0–8.3)

## 2015-02-28 LAB — CBC WITH DIFFERENTIAL/PLATELET
Basophils Absolute: 0 10*3/uL (ref 0.0–0.1)
Basophils Relative: 0.8 % (ref 0.0–3.0)
Eosinophils Absolute: 0.1 10*3/uL (ref 0.0–0.7)
Eosinophils Relative: 1.4 % (ref 0.0–5.0)
HCT: 29.9 % — ABNORMAL LOW (ref 36.0–46.0)
Hemoglobin: 9.5 g/dL — ABNORMAL LOW (ref 12.0–15.0)
Lymphocytes Relative: 16.3 % (ref 12.0–46.0)
Lymphs Abs: 1 10*3/uL (ref 0.7–4.0)
MCHC: 31.8 g/dL (ref 30.0–36.0)
MCV: 67 fl — ABNORMAL LOW (ref 78.0–100.0)
Monocytes Absolute: 0.4 10*3/uL (ref 0.1–1.0)
Monocytes Relative: 6.6 % (ref 3.0–12.0)
Neutro Abs: 4.7 10*3/uL (ref 1.4–7.7)
Neutrophils Relative %: 74.9 % (ref 43.0–77.0)
Platelets: 278 10*3/uL (ref 150.0–400.0)
RBC: 4.46 Mil/uL (ref 3.87–5.11)
RDW: 18.7 % — ABNORMAL HIGH (ref 11.5–15.5)
WBC: 6.2 10*3/uL (ref 4.0–10.5)

## 2015-02-28 LAB — BASIC METABOLIC PANEL
BUN: 15 mg/dL (ref 6–23)
CO2: 23 mEq/L (ref 19–32)
Calcium: 8.7 mg/dL (ref 8.4–10.5)
Chloride: 106 mEq/L (ref 96–112)
Creatinine, Ser: 0.93 mg/dL (ref 0.40–1.20)
GFR: 61.53 mL/min (ref >60.00)
Glucose, Bld: 177 mg/dL — ABNORMAL HIGH (ref 70–99)
Potassium: 3.8 mEq/L (ref 3.5–5.1)
Sodium: 135 mEq/L (ref 135–145)

## 2015-02-28 LAB — FERRITIN: Ferritin: 8.3 ng/mL — ABNORMAL LOW (ref 10.0–291.0)

## 2015-02-28 LAB — HEMOGLOBIN A1C: Hgb A1c MFr Bld: 7.3 % — ABNORMAL HIGH (ref 4.6–6.5)

## 2015-02-28 LAB — TSH: TSH: 1.19 u[IU]/mL (ref 0.35–4.50)

## 2015-03-01 ENCOUNTER — Other Ambulatory Visit: Payer: Self-pay | Admitting: Internal Medicine

## 2015-03-01 ENCOUNTER — Encounter: Payer: Self-pay | Admitting: *Deleted

## 2015-03-01 DIAGNOSIS — D649 Anemia, unspecified: Secondary | ICD-10-CM

## 2015-03-01 NOTE — Progress Notes (Signed)
Order placed for f/u labs.  

## 2015-03-11 ENCOUNTER — Other Ambulatory Visit: Payer: Self-pay | Admitting: Internal Medicine

## 2015-03-18 ENCOUNTER — Other Ambulatory Visit (INDEPENDENT_AMBULATORY_CARE_PROVIDER_SITE_OTHER): Payer: Medicare Other

## 2015-03-18 DIAGNOSIS — D649 Anemia, unspecified: Secondary | ICD-10-CM

## 2015-03-18 DIAGNOSIS — Z1239 Encounter for other screening for malignant neoplasm of breast: Secondary | ICD-10-CM

## 2015-03-18 LAB — CBC WITH DIFFERENTIAL/PLATELET
Basophils Absolute: 0.1 10*3/uL (ref 0.0–0.1)
Basophils Relative: 0.8 % (ref 0.0–3.0)
Eosinophils Absolute: 0.1 10*3/uL (ref 0.0–0.7)
Eosinophils Relative: 1.2 % (ref 0.0–5.0)
HCT: 31.4 % — ABNORMAL LOW (ref 36.0–46.0)
Hemoglobin: 9.9 g/dL — ABNORMAL LOW (ref 12.0–15.0)
Lymphocytes Relative: 18.3 % (ref 12.0–46.0)
Lymphs Abs: 1.1 10*3/uL (ref 0.7–4.0)
MCHC: 31.3 g/dL (ref 30.0–36.0)
MCV: 69 fl — ABNORMAL LOW (ref 78.0–100.0)
Monocytes Absolute: 0.4 10*3/uL (ref 0.1–1.0)
Monocytes Relative: 7.2 % (ref 3.0–12.0)
Neutro Abs: 4.4 10*3/uL (ref 1.4–7.7)
Neutrophils Relative %: 72.5 % (ref 43.0–77.0)
Platelets: 291 10*3/uL (ref 150.0–400.0)
RBC: 4.55 Mil/uL (ref 3.87–5.11)
RDW: 20.2 % — ABNORMAL HIGH (ref 11.5–15.5)
WBC: 6 10*3/uL (ref 4.0–10.5)

## 2015-03-18 LAB — FERRITIN: Ferritin: 6.5 ng/mL — ABNORMAL LOW (ref 10.0–291.0)

## 2015-03-19 ENCOUNTER — Other Ambulatory Visit: Payer: Self-pay | Admitting: Internal Medicine

## 2015-03-19 DIAGNOSIS — D509 Iron deficiency anemia, unspecified: Secondary | ICD-10-CM

## 2015-03-19 NOTE — Progress Notes (Signed)
Order placed for f/u cbc/ferritin.  

## 2015-04-20 NOTE — Discharge Summary (Signed)
PATIENT NAME:  Sandra Kaufman, YELLIN MR#:  [id number] DATE OF BIRTH:  Mar 09, 1932  DATE OF ADMISSION:  02/05/2014 DATE OF DISCHARGE:  02/08/2014  DISCHARGE DIAGNOSES: 1.  Acute cerebrovascular accident. 2.  Urinary tract infection, per urinalysis, now treated. Urine culture negative.  SECONDARY DIAGNOSES: 1.  Hypertension. 2.  Hyperlipidemia. 3.  History of glaucoma.   CONSULTATIONS: Speech therapy, physical and occupational therapy.   PROCEDURES AND RADIOLOGY: CT scan of the head with and without contrast on 11th of February showed evolving bilateral small vessel infarct. No associated mass effect or hemorrhage.   Bilateral carotid Doppler on 10th of February showed no hemodynamically significant stenosis.  Two-D echocardiogram on 10th of February showed no cardiac source of emboli. Normal global LV systolic function with an EF of 60% to 65%. Impaired relaxation pattern of LV diastolic filling.  CT scan of the head on 9th of February showed right globus pallidus and left thalamic infarct, which are new.   MAJOR LABORATORY PANEL: UA on admission showed 145 WBCs, 1+ bacteria, 3+ leukocyte esterase, and positive nitrite.   Urine culture had no growth, on 11th of February.   HISTORY AND SHORT HOSPITAL COURSE: The patient is a 81 year old female with the above-mentioned medical problems who was admitted for balance problems. Her initial CT was concerning for stroke. Please see Dr. Keenan Bachelor dictated history and physical for further details. Physical, occupational, and speech therapy consultation was obtained who recommended placement to skilled nursing facility. The patient also had a repeat CT scan as she was unable to get a MRI due to later pacemaker and repeat CT head showing evolving infarct. The patient is feeling somewhat better and is being discharged to short-term rehab/skilled nursing facility today in stable condition.   PERTINENT DISCHARGE PHYSICAL EXAMINATION: VITAL SIGNS: On the date  of discharge, her temperature was 98.2, heart rate 84 per minute, respirations 20 per minute, blood pressure 116/76 mmHg, and she is saturating 96% on room air.  CARDIOVASCULAR: S1 and S2 normal. No murmurs, rubs, or gallop.  LUNGS: Clear to auscultation bilaterally. No wheezes, rales, rhonchi, or crepitation.  ABDOMEN: Soft, benign.  NEUROLOGIC: Nonfocal examination. All other physical examination remained at the baseline.   DISCHARGE MEDICATIONS: 1.  Azopt 1% ophthalmic suspension 1 drop to each eye twice a day. 2.  Lumigan 0.01% one drop to each eye at bedtime. 3.  Welchol 625 mg 2 tablets p.o. b.i.d.  4.  Amitriptyline 10 mg p.o. at bedtime.  5.  Avapro 150 mg p.o. daily. 6.  Dexilant 60 mg p.o. daily.  7.  Synthroid 88 mcg p.o. daily.  8.  Metamucil 4 tablets p.o. twice a day. 9.  Coenzyme Q once daily. 10.  Ranitidine 300 mg p.o. daily.  11.  Namenda 10 mg p.o. b.i.d.  12.  Atorvastatin 40 mg p.o. daily.  13.  Aspirin 325 mg p.o. daily.   DISCHARGE DIET:  The patient was recommended dysphagia III with nectar thick liquid diet with no straws, per speech therapy.  DISCHARGE INSTRUCTIONS AND FOLLOW-UP: The patient was instructed to follow up with her primary care physician, Dr. Einar Pheasant, in 1 to 2 weeks. She will need physical therapy evaluation and management while at the facility.   TOTAL TIME DISCHARGING THE PATIENT: 55 minutes.  ____________________________ Lucina Mellow. Manuella Ghazi, MD vss:sb D: 02/08/2014 13:51:27 ET T: 02/08/2014 14:05:24 ET JOB#: [id number]  cc: Donnald Tabar S. Manuella Ghazi, MD, <Dictator> Einar Pheasant, MD [place] MD ELECTRONICALLY SIGNED 02/10/2014 12:22

## 2015-04-20 NOTE — H&P (Signed)
PATIENT NAME:  Sandra Kaufman, Sandra Kaufman MR#:  [id number] DATE OF BIRTH:  Feb 02, 1932  DATE OF ADMISSION:  02/05/2014  PRIMARY CARE PHYSICIAN:  Dr. Einar Pheasant.   The patient is a 81 year old Caucasian female with past medical history significant for history of hypertension as well as hyperlipidemia, who presents to the hospital with discomfort and balance problems. According to the patient, she noted that her balance was not the best approximately a week ago. She also noted some confusion. She has been using regularly a cane, but it appears that her balance was even not the best with the cane, which is out of her normal. She also noted that her left foot would get caught up when she walks. She denies any significant other problems; however, her neighbor noted that she has left facial droop as well. She presented to the hospital for further evaluation, and she was noted to have mild weakness in left hand grip, as well as some weakness in left foot dorsiflexion. She was also noted to have tongue deviation to the left. Hospitalist services were contacted for admission. CT scan of her head done in the Emergency Room revealed 2 stroke areas; one in right globus pallidus and the other one was in the left thalamus of unclear, undetermined age.   PAST MEDICAL HISTORY: Significant for history of hypertension, as well as hyperlipidemia, also dysphagia with esophageal stretching.  The patient has history of glaucoma.  MEDICATIONS: Amitriptyline 10 mg p.o. at bedtime, Avapro 150 mg p.o. daily, Azopt 1% ophthalmic suspension 1 drop to each eye twice daily, coenzyme Q10, 100 mg p.o. daily;  Dexilant 60 mg p.o. in the evening, fish oil 1200 mg p.o. twice daily, Lumigan 0.01% ophthalmic solution, 1 drop to each eye at bedtime; Metamucil 4 tablets twice daily, Namenda 10 mg p.o. twice daily, ranitidine 300 mg p.o. in the morning, Synthroid 88 mcg p.o. daily and Welchol 625 mg tablets 3 tablets twice daily.   ALLERGIES:  TRAMADOL, WHICH GIVES HER NAUSEA, VOMITING, DIZZINESS, AS WELL AS DIARRHEA AND FAINTING.   PAST SURGICAL HISTORY:  Appendectomy as well as hysterectomy for benign reasons.   FAMILY HISTORY: Diabetes mellitus in the patient's sister, who died of diabetic kidney disease. The patient's father started having coronary artery disease and first MI at the age of 56. The patient's mom had rheumatoid arthritis.   SOCIAL HISTORY: The patient is widowed since 16. She lives alone. Her son lives close by. She does not smoke, however, drinks some alcohol. She drinks approximately 1 cup of wine a week. She used to work in [employer].   REVIEW OF SYSTEMS: Positive for weight gain of approximately 20 pounds in the past 1 year. Also, glaucoma, difficulty swallowing.  She admits of having difficulty swallowing and that seems to be chronic. She had most recent esophageal stretch approximately a year ago. She thinks it is worse  with Dr. Allen Norris, but she is not sure. She admits of having intermittent dyspnea, especially whenever she is exerting herself, dyspnea on exertion; however, denies any orthopnea. Denies any significant lower extremity swelling. Admits of having some intermittent diarrhea, which seems to be chronic. Also admits of having some dizziness over the past 1 week, as well as problems with balance and left foot weakness. Denies any fevers, chills. Significant weakness as mentioned above. She denies any pain or weight loss.  EYES:  Denies blurry vision, double vision, glaucoma or cataracts.  ENT:  Denies tinnitus, allergies, epistaxis, sinus pain, dentures, difficulty swallowing,  except as mentioned above.  RESPIRATORY: Denies any cough, wheezes, asthma, COPD.  CARDIOVASCULAR: Denies chest pains, orthopnea, arrhythmias, palpitations or syncope. GASTROINTESTINAL: Denies nausea, vomiting, rectal bleeding or change in bowel habits. GENITOURINARY: Denies dysuria, hematuria, frequency or incontinence.   ENDOCRINE: Denies any polydipsia, nocturia, thyroid problems, heat or cold intolerance or thirst.  HEMATOLOGIC: Denies anemia, easy bruising, bleeding or swollen glands.  SKIN: Denies any acne, rashes, change in moles.  MUSCULOSKELETAL:  Denies arthritis, cramps, swelling, gout.  NEUROLOGIC: No numbness, epilepsy or tremor.  PSYCHIATRIC: Denies anxiety, insomnia or depression.   PHYSICAL EXAMINATION: VITAL SIGNS: On arrival to the hospital, temperature is 98.3. Pulse was 85. Respiration was 18. Blood pressure 161/81. Saturation was 98% on room air.  GENERAL: This is a well-developed, well-nourished Caucasian female in no distress, lying on the stretcher.  HEENT: Her pupils are equal, reactive to light. Extraocular movements intact. No icterus or conjunctivitis. Has normal hearing. No pharyngeal erythema. Mucosa is moist. The patient does have some mild weakness of her lower facial area, but minimal. Her smile is almost symmetric.  She does deviation of tongue to the left.  NECK: No masses. Supple, nontender. Thyroid is not enlarged. No adenopathy. No JVD or carotid bruits bilaterally. Full range of motion.  LUNGS: Clear to auscultation in all fields. No rales, rhonchi, diminished breath sounds or wheezing. No labored inspirations, increased effort, dullness to percussion or overt respiratory distress.  CARDIOVASCULAR: S1, S2 appreciated. A 2/6 systolic murmur was heard. The chest is nontender to palpation.  1+ pedal pulses.   EXTREMITIES:  No lower extremity edema, calf tenderness or cyanosis was noted.  ABDOMEN: Soft, nontender. Bowel sounds were present. No hepatosplenomegaly or masses were noted.  RECTAL: Deferred.  MUSCULOSKELETAL:  Muscle strength: Able to move all extremities, almost symmetric strength with some mild decrease of strength in left hand and dorsiflexion of left foot. Reflexes are symmetrical. No ataxia symptoms.  Muscle strength 5-/5 on the left, 5/5 on the right.  No cyanosis,  degenerative joint disease or kyphosis. Gait was not tested.  SKIN: Did not reveal any rashes, lesions, erythema, nodularity or induration. It was warm and dry to palpation.   LYMPHATICS:  No adenopathy in the cervical region.  NEUROLOGICAL: As mentioned above, the patient does have minimal, if any, lower facial weakness on the left. Tongue is deviated to the left. Sensory is intact. No dysarthria or aphasia. PSYCHIATRIC:  The patient is alert, oriented to time, person and place, cooperative. Memory is somewhat impaired, but no significant confusion, agitation or depression noted.   EKG:  With artifact. Noted to be in sinus rhythm at rate of 87 beats per minute. Normal axis. No acute ST-T changes were noted.   LABORATORIES: BMP showed glucose to 106, otherwise unremarkable. Liver enzymes were normal. Troponin less than 0.02. TSH was low at 0.106. CBC:  White blood count 6.1, hemoglobin 11.8, platelet count 264. Coagulation panel unremarkable.   CT scan of head without contrast, 02/05/2014, revealed right globus pallidus as well as left thalamic infarct, new from prior MR; however, an indeterminate age. MR can be obtained for further delineation if clinically desired. Small vessel disease-type changes, most prominent at right frontal periventricular region. No intracranial hemorrhage. No intracranial mass lesion noted on this unenhanced exam. Global atrophy without hydrocephalus was also noted.   ASSESSMENT AND PLAN: 1.  Stroke. Admit the patient to medical floor, telemetry. Started her on aspirin therapy. Unfortunately, since the patient failed bedside swallowing testing, we will need  to have aspirin rectally.  We also will need to hold Lipitor until she is able to take p.o.  We will get a lipid panel in the morning, involve occupational therapy, physical therapy, as well as speech therapy evaluation. We will get echocardiogram as well as carotid ultrasound.  2.  Hypertension. Continue home medications.  For now, we will need to add a clonidine patch.  3.  Hyperlipidemia. We will continue home medications whenever she is able to take p.o. We will add likely Lipitor. We will get lipid panel in the morning.  4.  Dysphagia. The patient needs esophageal dilatation in the next 1 to 2 weeks after discharge home, with Dr. Allen Norris likely; however, now she just failed a bedside swallowing test. We will get speech therapist evaluation and do modified barium swallowing study tomorrow. We will suspend all of her  p.o. medications as well as p.o. meals until tomorrow. We will initiate IV fluids for now, and we will follow her well-being.  5.  Generalized weakness, reports balance problems, ataxia.  We will get physical therapist evaluation. The patient may need to have rehabilitation placement. We will get care management consultation, since the patient lives alone, and we need to make sure that she is safe to be discharged home.   TIME SPENT:  One hour.     ____________________________ Theodoro Grist, MD rv:dmm D: 02/05/2014 21:51:00 ET T: 02/05/2014 22:21:56 ET JOB#: [id number]  cc: Einar Pheasant, MD Theodoro Grist, MD, <Dictator> Jerzi Tigert MD ELECTRONICALLY SIGNED 02/28/2014 20:58

## 2015-04-30 ENCOUNTER — Other Ambulatory Visit: Payer: Self-pay | Admitting: Internal Medicine

## 2015-05-13 DIAGNOSIS — H4011X3 Primary open-angle glaucoma, severe stage: Secondary | ICD-10-CM | POA: Diagnosis not present

## 2015-05-16 ENCOUNTER — Encounter: Payer: Self-pay | Admitting: Internal Medicine

## 2015-05-16 ENCOUNTER — Ambulatory Visit (INDEPENDENT_AMBULATORY_CARE_PROVIDER_SITE_OTHER): Payer: Medicare Other | Admitting: Internal Medicine

## 2015-05-16 VITALS — BP 137/75 | HR 72 | Temp 98.0°F | Ht 62.0 in | Wt 179.1 lb

## 2015-05-16 DIAGNOSIS — E7800 Pure hypercholesterolemia, unspecified: Secondary | ICD-10-CM

## 2015-05-16 DIAGNOSIS — K219 Gastro-esophageal reflux disease without esophagitis: Secondary | ICD-10-CM

## 2015-05-16 DIAGNOSIS — F329 Major depressive disorder, single episode, unspecified: Secondary | ICD-10-CM

## 2015-05-16 DIAGNOSIS — I10 Essential (primary) hypertension: Secondary | ICD-10-CM

## 2015-05-16 DIAGNOSIS — G629 Polyneuropathy, unspecified: Secondary | ICD-10-CM

## 2015-05-16 DIAGNOSIS — I639 Cerebral infarction, unspecified: Secondary | ICD-10-CM

## 2015-05-16 DIAGNOSIS — E669 Obesity, unspecified: Secondary | ICD-10-CM

## 2015-05-16 DIAGNOSIS — R739 Hyperglycemia, unspecified: Secondary | ICD-10-CM

## 2015-05-16 DIAGNOSIS — H4011X2 Primary open-angle glaucoma, moderate stage: Secondary | ICD-10-CM | POA: Diagnosis not present

## 2015-05-16 DIAGNOSIS — R1310 Dysphagia, unspecified: Secondary | ICD-10-CM

## 2015-05-16 DIAGNOSIS — Z0000 Encounter for general adult medical examination without abnormal findings: Secondary | ICD-10-CM

## 2015-05-16 DIAGNOSIS — D509 Iron deficiency anemia, unspecified: Secondary | ICD-10-CM | POA: Diagnosis not present

## 2015-05-16 DIAGNOSIS — F32A Depression, unspecified: Secondary | ICD-10-CM

## 2015-05-16 DIAGNOSIS — E039 Hypothyroidism, unspecified: Secondary | ICD-10-CM

## 2015-05-16 LAB — CBC WITH DIFFERENTIAL/PLATELET
Basophils Absolute: 0 10*3/uL (ref 0.0–0.1)
Basophils Relative: 0.3 % (ref 0.0–3.0)
Eosinophils Absolute: 0.2 10*3/uL (ref 0.0–0.7)
Eosinophils Relative: 2.5 % (ref 0.0–5.0)
HCT: 35.6 % — ABNORMAL LOW (ref 36.0–46.0)
Hemoglobin: 11.8 g/dL — ABNORMAL LOW (ref 12.0–15.0)
Lymphocytes Relative: 16.9 % (ref 12.0–46.0)
Lymphs Abs: 1.1 10*3/uL (ref 0.7–4.0)
MCHC: 33.1 g/dL (ref 30.0–36.0)
MCV: 78.7 fl (ref 78.0–100.0)
Monocytes Absolute: 0.6 10*3/uL (ref 0.1–1.0)
Monocytes Relative: 8.8 % (ref 3.0–12.0)
Neutro Abs: 4.5 10*3/uL (ref 1.4–7.7)
Neutrophils Relative %: 71.5 % (ref 43.0–77.0)
Platelets: 225 10*3/uL (ref 150.0–400.0)
RBC: 4.52 Mil/uL (ref 3.87–5.11)
RDW: 21 % — ABNORMAL HIGH (ref 11.5–15.5)
WBC: 6.2 10*3/uL (ref 4.0–10.5)

## 2015-05-16 LAB — FERRITIN: Ferritin: 20 ng/mL (ref 10.0–291.0)

## 2015-05-16 NOTE — Progress Notes (Signed)
Pre visit review using our clinic review tool, if applicable. No additional management support is needed unless otherwise documented below in the visit note. 

## 2015-05-20 ENCOUNTER — Other Ambulatory Visit: Payer: Self-pay | Admitting: Internal Medicine

## 2015-05-20 NOTE — Telephone Encounter (Signed)
Last OV 5.19.16, last refill 4.1.16.  Please advise refill

## 2015-05-20 NOTE — Telephone Encounter (Signed)
Refilled sertraline #90 with 2 refills.

## 2015-05-24 DIAGNOSIS — K219 Gastro-esophageal reflux disease without esophagitis: Secondary | ICD-10-CM | POA: Diagnosis not present

## 2015-05-24 DIAGNOSIS — K222 Esophageal obstruction: Secondary | ICD-10-CM | POA: Diagnosis not present

## 2015-05-27 ENCOUNTER — Other Ambulatory Visit: Payer: Self-pay | Admitting: Internal Medicine

## 2015-05-27 ENCOUNTER — Encounter: Payer: Self-pay | Admitting: Internal Medicine

## 2015-05-27 DIAGNOSIS — Z0000 Encounter for general adult medical examination without abnormal findings: Secondary | ICD-10-CM | POA: Insufficient documentation

## 2015-05-27 NOTE — Progress Notes (Signed)
Patient ID: Sandra Kaufman, female   DOB: 1932/11/09, 81 y.o.   MRN: [id number]   Subjective:    Patient ID: Sandra Kaufman, female    DOB: 17-Apr-1932, 81 y.o.   MRN: [id number]  HPI  Patient here to follow up on these issues as well as for a complete physical exam.  Discussed her home situation.  She has sitters that stay with her now - since she had her stroke.  She does not feel she needs this anymore.  Discussed the need.  She wants to be at her house by herself.  Does report some decreased energy.  No chest pain or tightness.  Feels her breathing is stable.  Eating and drinking well.  No nausea or vomiting.  Bowels stable.     Past Medical History  Diagnosis Date  . Hypertension   . Hypercholesterolemia   . Hypothyroidism   . COPD (chronic obstructive pulmonary disease)   . GERD (gastroesophageal reflux disease)     with esophageal stricture requiring dilatation x 2 (12/96 and 10/99)  . Arthritis   . Glaucoma   . Peripheral neuropathy   . History of chicken pox   . Diverticulosis   . Migraines   . Colon polyps   . Urinary incontinence     Current Outpatient Prescriptions on File Prior to Visit  Medication Sig Dispense Refill  . amitriptyline (ELAVIL) 10 MG tablet Take 2 tablets (20 mg total) by mouth at bedtime. 60 tablet 2  . aspirin 325 MG tablet Take 325 mg by mouth daily.    Marland Kitchen atorvastatin (LIPITOR) 40 MG tablet TAKE 1 TABLET (40 MG TOTAL) BY MOUTH DAILY. 30 tablet 5  . bimatoprost (LUMIGAN) 0.03 % ophthalmic solution Place 1 drop into both eyes at bedtime.    . brinzolamide (AZOPT) 1 % ophthalmic suspension Place 1 drop into both eyes 2 (two) times daily.     . Coenzyme Q10 (CO Q10) 100 MG CAPS Take 1 capsule by mouth daily.    . colesevelam (WELCHOL) 625 MG tablet Take 1,875 mg by mouth 2 (two) times daily with a meal.     . cyanocobalamin 2000 MCG tablet Take 2,000 mcg by mouth daily.    Marland Kitchen esomeprazole (NEXIUM) 40 MG capsule Take 1 capsule (40 mg total) by mouth  daily at 12 noon. 30 capsule 5  . fluticasone (FLONASE) 50 MCG/ACT nasal spray PLACE 2 SPRAYS INTO THE NOSE DAILY. 16 g 2  . fluticasone (FLOVENT HFA) 110 MCG/ACT inhaler Inhale 2 puffs into the lungs 2 (two) times daily. 1 Inhaler 2  . irbesartan (AVAPRO) 150 MG tablet TAKE 1 TABLET BY MOUTH EVERY DAY 90 tablet 1  . irbesartan (AVAPRO) 150 MG tablet TAKE 1 TABLET BY MOUTH EVERY DAY 90 tablet 1  . levothyroxine (SYNTHROID, LEVOTHROID) 100 MCG tablet TAKE 1 TABLET (100 MCG TOTAL) BY MOUTH DAILY BEFORE BREAKFAST. 90 tablet 1  . NAMENDA 10 MG tablet TAKE 1 TABLET BY MOUTH TWICE A DAY 180 tablet 1  . PROAIR HFA 108 (90 BASE) MCG/ACT inhaler INHALE 2 PUFFS INTO THE LUNGS EVERY 6 (SIX) HOURS AS NEEDED FOR WHEEZING OR SHORTNESS OF BREATH. 8.5 each 0  . psyllium (METAMUCIL) 0.52 G capsule 4 capsules bid    . ranitidine (ZANTAC) 300 MG tablet Take 1 tablet (300 mg total) by mouth daily. 30 tablet 5  . SYNTHROID 112 MCG tablet TAKE 1 TABLET (112 MCG TOTAL) BY MOUTH DAILY. 90 tablet 1   No current facility-administered  medications on file prior to visit.    Review of Systems  Constitutional: Positive for fatigue. Negative for appetite change and unexpected weight change.  HENT: Negative for congestion and sinus pressure.   Respiratory: Negative for cough, chest tightness and shortness of breath.   Cardiovascular: Negative for chest pain and palpitations.  Gastrointestinal: Negative for nausea, vomiting, abdominal pain and diarrhea.  Genitourinary: Negative for dysuria and difficulty urinating.  Skin: Negative for color change and rash.  Neurological: Negative for dizziness, light-headedness and headaches.  Hematological: Negative for adenopathy. Does not bruise/bleed easily.  Psychiatric/Behavioral: Negative for agitation.       Some increased stress with her living situation as outlined.         Objective:    Physical Exam  Constitutional: She appears well-developed and well-nourished. No  distress.  HENT:  Nose: Nose normal.  Mouth/Throat: Oropharynx is clear and moist.  Neck: Neck supple. No thyromegaly present.  Cardiovascular: Normal rate and regular rhythm.   Pulmonary/Chest: Breath sounds normal. No respiratory distress. She has no wheezes.  Abdominal: Soft. Bowel sounds are normal. There is no tenderness.  Musculoskeletal: She exhibits no edema or tenderness.  Lymphadenopathy:    She has no cervical adenopathy.  Skin: No rash noted. No erythema.  Psychiatric: Her behavior is normal.    BP 137/75 mmHg  Pulse 72  Temp(Src) 98 F (36.7 C) (Oral)  Ht _0  (1.575 m)  Wt 179 lb 2 oz (81.251 kg)  BMI 32.75 kg/m2  SpO2 98% Wt Readings from Last 3 Encounters:  05/16/15 179 lb 2 oz (81.251 kg)  02/14/15 174 lb 6 oz (79.096 kg)  08/20/14 167 lb 8 oz (75.978 kg)     Lab Results  Component Value Date   WBC 6.2 05/16/2015   HGB 11.8* 05/16/2015   HCT 35.6* 05/16/2015   PLT 225.0 05/16/2015   GLUCOSE 177* 02/28/2015   CHOL 137 02/28/2015   TRIG 90.0 02/28/2015   HDL 51.90 02/28/2015   LDLDIRECT 183.4 08/31/2013   LDLCALC 67 02/28/2015   ALT 33 02/28/2015   AST 29 02/28/2015   NA 135 02/28/2015   K 3.8 02/28/2015   CL 106 02/28/2015   CREATININE 0.93 02/28/2015   BUN 15 02/28/2015   CO2 23 02/28/2015   TSH 1.19 02/28/2015   INR 1.1 02/05/2014   HGBA1C 7.3* 02/28/2015   MICROALBUR 0.5 01/04/2014       Assessment & Plan:   Problem List Items Addressed This Visit    CVA (cerebral vascular accident)    Continue daily aspirin.  Seeing neurology.  Has sitters.  Follow.       Depression    On zoloft.  Follow.       Dysphagia - Primary    EGD as outlined.  Saw GI.  S/p dilatation.  Still having issues with swallowing.  Refer back to GI for evaluation.       Relevant Orders   Ambulatory referral to Gastroenterology   GERD (gastroesophageal reflux disease)    On dexilant and zantac.  Follow.       Health care maintenance    Overdue physical.   Mammogram 02/26/15 - Birads I.  Colonoscopy 06/05/10.        Hypercholesterolemia    Low cholesterol diet and exercise.  On lipitor.  Follow lipid panel and liver function tests.        Relevant Orders   Lipid panel   Hepatic function panel   Hyperglycemia  Low carb diet and exercise.  Follow met b and a1c.        Relevant Orders   Hemoglobin A1c   Microalbumin / creatinine urine ratio   Hypertension    Blood pressure has been doing well.  Follow pressures.  Follow metabolic panel.       Relevant Orders   Basic metabolic panel   Hypothyroidism    On thyroid replacement.  Follow tsh.       Neuropathy    On amitriptyline.  Follow.       Obesity    Diet and exercise.  Follow.         Other Visit Diagnoses    Anemia, iron deficiency        Relevant Orders    CBC with Differential/Platelet    Ferritin      I spent 25 minutes with the patient and more than 50% of the time was spent in consultation regarding the above.     Einar Pheasant, MD

## 2015-05-27 NOTE — Assessment & Plan Note (Signed)
On thyroid replacement.  Follow tsh.  

## 2015-05-27 NOTE — Assessment & Plan Note (Addendum)
EGD as outlined.  Saw GI.  S/p dilatation.  Still having issues with swallowing.  Refer back to GI for evaluation.

## 2015-05-27 NOTE — Assessment & Plan Note (Signed)
Low cholesterol diet and exercise.  On lipitor.  Follow lipid panel and liver function tests.   

## 2015-05-27 NOTE — Assessment & Plan Note (Signed)
On dexilant and zantac.  Follow.

## 2015-05-27 NOTE — Assessment & Plan Note (Signed)
Blood pressure has been doing well.  Follow pressures.  Follow metabolic panel.   

## 2015-05-27 NOTE — Assessment & Plan Note (Signed)
On amitriptyline.  Follow.

## 2015-05-27 NOTE — Assessment & Plan Note (Signed)
Diet and exercise.  Follow.  

## 2015-05-27 NOTE — Assessment & Plan Note (Signed)
Overdue physical.  Mammogram 02/26/15 - Birads I.  Colonoscopy 06/05/10.

## 2015-05-27 NOTE — Assessment & Plan Note (Signed)
On zoloft.  Follow.  

## 2015-05-27 NOTE — Assessment & Plan Note (Signed)
Low carb diet and exercise.  Follow met b and a1c.   

## 2015-05-27 NOTE — Assessment & Plan Note (Signed)
Continue daily aspirin.  Seeing neurology.  Has sitters.  Follow.

## 2015-05-31 ENCOUNTER — Encounter: Payer: Self-pay | Admitting: Internal Medicine

## 2015-06-10 ENCOUNTER — Telehealth: Payer: Self-pay

## 2015-06-10 NOTE — Telephone Encounter (Signed)
Pt coming in on Friday at 1:30 for an appt.

## 2015-06-10 NOTE — Telephone Encounter (Signed)
Need to know specifically what symptoms she is having.  If persistent cough, will need to be evaluated.  Regarding referral - need specific symptoms.

## 2015-06-10 NOTE — Telephone Encounter (Signed)
Please advise-it looks like son sent mychart message on 05/31/15 basically checking on ENT referral. I do not see a ENT referral in chart. Please advise

## 2015-06-10 NOTE — Telephone Encounter (Signed)
The son called hoping a medication can be called in due to the patient having a bad cough   Boone Master (sonUlice Dash [phone])

## 2015-06-14 ENCOUNTER — Encounter: Payer: Self-pay | Admitting: Nurse Practitioner

## 2015-06-14 ENCOUNTER — Ambulatory Visit (INDEPENDENT_AMBULATORY_CARE_PROVIDER_SITE_OTHER): Payer: Medicare Other | Admitting: Nurse Practitioner

## 2015-06-14 ENCOUNTER — Ambulatory Visit: Payer: Medicare Other | Admitting: Internal Medicine

## 2015-06-14 VITALS — BP 122/80 | HR 81 | Temp 98.0°F | Resp 16 | Ht 62.0 in | Wt 178.0 lb

## 2015-06-14 DIAGNOSIS — I639 Cerebral infarction, unspecified: Secondary | ICD-10-CM | POA: Diagnosis not present

## 2015-06-14 DIAGNOSIS — R05 Cough: Secondary | ICD-10-CM | POA: Diagnosis not present

## 2015-06-14 DIAGNOSIS — R059 Cough, unspecified: Secondary | ICD-10-CM

## 2015-06-14 MED ORDER — ALBUTEROL SULFATE HFA 108 (90 BASE) MCG/ACT IN AERS: 2.0000 | INHALATION_SPRAY | Freq: Four times a day (QID) | RESPIRATORY_TRACT | Status: DC | PRN

## 2015-06-14 MED ORDER — GUAIFENESIN-CODEINE 100-10 MG/5ML PO SYRP: 5.0000 mL | ORAL_SOLUTION | Freq: Every day | ORAL | Status: DC

## 2015-06-14 NOTE — Progress Notes (Signed)
Pre visit review using our clinic review tool, if applicable. No additional management support is needed unless otherwise documented below in the visit note. 

## 2015-06-14 NOTE — Progress Notes (Signed)
   Subjective:    Patient ID: Sandra Kaufman, female    DOB: 02-20-1932, 81 y.o.   MRN: [id number]  HPI  [name] is a 81 yo female with a CC of cough worsening over 3 months. She is accompanied by her son today.  1) Cough is mainly dry, she reports it isn't worse, just staying the same, wearing her out. Worse during the day, later in the afternoon. Just starts coughing and has a spell.  Nothing that makes it worse she reports  Flovent HFA- not using the inhaler  Flonase nasal spray   Review of Systems  Constitutional: Negative for fever, chills, diaphoresis and fatigue.  HENT: Negative for congestion, rhinorrhea, sinus pressure and sneezing.   Eyes: Negative for visual disturbance.  Respiratory: Negative for cough, chest tightness and wheezing.   Cardiovascular: Negative for chest pain, palpitations and leg swelling.  Gastrointestinal: Negative for nausea, vomiting and diarrhea.  Skin: Negative for rash.  Neurological: Negative for dizziness and headaches.       Objective:   Physical Exam  Constitutional: She is oriented to person, place, and time. She appears well-developed and well-nourished. No distress.  BP 122/80 mmHg  Pulse 81  Temp(Src) 98 F (36.7 C) (Oral)  Resp 16  Ht 5\' 2"  (1.575 m)  Wt 178 lb (80.74 kg)  BMI 32.55 kg/m2  SpO2 97%   HENT:  Head: Normocephalic and atraumatic.  Right Ear: External ear normal.  Left Ear: External ear normal.  Mouth/Throat: No oropharyngeal exudate.  Eyes: EOM are normal. Pupils are equal, round, and reactive to light. Right eye exhibits no discharge. Left eye exhibits no discharge. No scleral icterus.  Neck: Normal range of motion. Neck supple.  Cardiovascular: Normal rate, regular rhythm, normal heart sounds and intact distal pulses.  Exam reveals no gallop and no friction rub.   No murmur heard. Pulmonary/Chest: Effort normal. No respiratory distress. She has wheezes. She has no rales. She exhibits no tenderness.    Lymphadenopathy:    She has no cervical adenopathy.  Neurological: She is alert and oriented to person, place, and time. No cranial nerve deficit. She exhibits normal muscle tone. Coordination normal.  Skin: Skin is warm and dry. No rash noted. She is not diaphoretic.  Psychiatric: She has a normal mood and affect. Her behavior is normal. Judgment and thought content normal.      Assessment & Plan:

## 2015-06-14 NOTE — Patient Instructions (Addendum)
1 teaspoon at night (cough syrup).   Use the inhaler as needed for shortness of breath/ wheezing.   Follow up with Dr. Nicki Reaper soon in August and let our office know Monday if not helpful.

## 2015-06-16 DIAGNOSIS — R05 Cough: Secondary | ICD-10-CM | POA: Insufficient documentation

## 2015-06-16 DIAGNOSIS — R059 Cough, unspecified: Secondary | ICD-10-CM | POA: Insufficient documentation

## 2015-06-16 NOTE — Assessment & Plan Note (Signed)
3 months of chronic cough. Worsening in frequency. Will obtain chest x-ray, not using current inhalers! Asked her to try the albuterol inhaler- sent in refill. Robitussin AC also printed and sent with pt. Discussed taking only 1 tsp (5 mL) at night when going to bed due to drowsy side effects. Following up with Dr. Nicki Reaper in a few months. Asked them to call next week if not helpful.

## 2015-06-21 ENCOUNTER — Encounter: Payer: Self-pay | Admitting: Internal Medicine

## 2015-06-28 ENCOUNTER — Telehealth: Payer: Self-pay | Admitting: Internal Medicine

## 2015-06-28 ENCOUNTER — Telehealth: Payer: Self-pay

## 2015-06-28 ENCOUNTER — Other Ambulatory Visit: Payer: Self-pay | Admitting: Nurse Practitioner

## 2015-06-28 NOTE — Telephone Encounter (Signed)
FYI, per team health, patient was scheduled to see [name] on 07/02/15. See team health note.

## 2015-06-28 NOTE — Telephone Encounter (Signed)
FYI

## 2015-06-28 NOTE — Telephone Encounter (Signed)
This is not a prescription that is routinely prescribed continuously.  If persistent symptoms, will need to be evaluated.

## 2015-06-28 NOTE — Telephone Encounter (Signed)
The patient called and is hoping to have an rx called for depression.  She states she is experiencing depression that is getting worse.  Callback - [phone]  Call sent to team health.

## 2015-06-28 NOTE — Telephone Encounter (Signed)
Spoke with son & he stated that 07/04/15 @ 11am would be better because he is off. He states that she is not having suicidal ideations. I have also informed the son that if anything changed over the weekend or holidays that's acute, to take her to the ER for psych evaluation.

## 2015-06-28 NOTE — Telephone Encounter (Signed)
Last Ov 6.17.16.  Please advise refill

## 2015-06-28 NOTE — Telephone Encounter (Signed)
If pt ok to wait, I can see her on 07/04/15 - 11:00 - 30 min.  If anything more acute going on or problems, will need to be seen earlier.  Confirm no suicidal ideations.

## 2015-06-28 NOTE — Telephone Encounter (Signed)
[facility]  Patient Name: Sandra Kaufman  DOB: 23-May-1932    Initial Comment Caller states she is having depression and needs something for it   Nurse Assessment  Nurse: Wynetta Emery, RN, Baker Janus Date/Time Eilene Ghazi Time): 06/28/2015 12:43:54 PM  Confirm and document reason for call. If symptomatic, describe symptoms. ---Langley Gauss can stay in bed, no energy, does not want to do anything onset few days  Has the patient traveled out of the country within the last 30 days? ---No  Does the patient require triage? ---Yes  Related visit to physician within the last 2 weeks? ---No  Does the PT have any chronic conditions? (i.e. diabetes, asthma, etc.) ---Unknown     Guidelines    Guideline Title Affirmed Question Affirmed Notes  Depression [1] Depression AND [2] worsening (e.g.,sleeping poorly, less able to do activities of daily living)    Final Disposition User   See Physician within 24 Hours Wynetta Emery, RN, Baker Janus    Comments  Appt. time 07-02-2015 1130am [name] E. Doss (no available appt with Nicki Reaper MD)

## 2015-07-02 ENCOUNTER — Ambulatory Visit: Payer: Self-pay | Admitting: Nurse Practitioner

## 2015-07-03 ENCOUNTER — Encounter: Payer: Self-pay | Admitting: Internal Medicine

## 2015-07-03 NOTE — Telephone Encounter (Signed)
I just received this my chart message.  She has an appt with me tomorrow.  Is she ok to wait until appt.  Please call son and confirm.  Thanks.

## 2015-07-04 ENCOUNTER — Ambulatory Visit (INDEPENDENT_AMBULATORY_CARE_PROVIDER_SITE_OTHER): Payer: Medicare Other | Admitting: Internal Medicine

## 2015-07-04 ENCOUNTER — Encounter: Payer: Self-pay | Admitting: Internal Medicine

## 2015-07-04 VITALS — BP 120/80 | HR 82 | Temp 98.3°F | Ht 62.0 in | Wt 180.0 lb

## 2015-07-04 DIAGNOSIS — I10 Essential (primary) hypertension: Secondary | ICD-10-CM | POA: Diagnosis not present

## 2015-07-04 DIAGNOSIS — R739 Hyperglycemia, unspecified: Secondary | ICD-10-CM

## 2015-07-04 DIAGNOSIS — E7800 Pure hypercholesterolemia, unspecified: Secondary | ICD-10-CM

## 2015-07-04 DIAGNOSIS — R5383 Other fatigue: Secondary | ICD-10-CM

## 2015-07-04 DIAGNOSIS — I639 Cerebral infarction, unspecified: Secondary | ICD-10-CM | POA: Diagnosis not present

## 2015-07-04 DIAGNOSIS — Z0000 Encounter for general adult medical examination without abnormal findings: Secondary | ICD-10-CM

## 2015-07-04 DIAGNOSIS — E039 Hypothyroidism, unspecified: Secondary | ICD-10-CM

## 2015-07-04 DIAGNOSIS — K5730 Diverticulosis of large intestine without perforation or abscess without bleeding: Secondary | ICD-10-CM | POA: Diagnosis not present

## 2015-07-04 DIAGNOSIS — F329 Major depressive disorder, single episode, unspecified: Secondary | ICD-10-CM

## 2015-07-04 DIAGNOSIS — F32A Depression, unspecified: Secondary | ICD-10-CM

## 2015-07-04 DIAGNOSIS — E669 Obesity, unspecified: Secondary | ICD-10-CM

## 2015-07-04 MED ORDER — SERTRALINE HCL 100 MG PO TABS: 100.0000 mg | ORAL_TABLET | Freq: Every day | ORAL | Status: DC

## 2015-07-04 NOTE — Progress Notes (Signed)
Patient ID: Sandra Kaufman, female   DOB: 1932/09/08, 81 y.o.   MRN: [id number]   Subjective:    Patient ID: Sandra Kaufman, female    DOB: 07-Jan-1932, 81 y.o.   MRN: [id number]  HPI  Patient here as a work in to discuss some increased fatigue and and issues with increased depression.  Since her stroke, she feels she has lost her independence.  She has sitters with her.  She does not drive.  Her son has taken over her finances.  She wants to be more involved and wants to know what exactly is going on.  Some increased fatigue related to this.  Denies any suicidal ideations.  Does report feeling depressed.  Discussed this at length with her and her son today.  Discussed increasing her zoloft.  Discussed referral to psychiatry.  Discussed allowing her to be more involved or at least go over her bank statement, etc.  She is eating and drinking.     Past Medical History  Diagnosis Date  . Hypertension   . Hypercholesterolemia   . Hypothyroidism   . COPD (chronic obstructive pulmonary disease)   . GERD (gastroesophageal reflux disease)     with esophageal stricture requiring dilatation x 2 (12/96 and 10/99)  . Arthritis   . Glaucoma   . Peripheral neuropathy   . History of chicken pox   . Diverticulosis   . Migraines   . Colon polyps   . Urinary incontinence     Current Outpatient Prescriptions on File Prior to Visit  Medication Sig Dispense Refill  . albuterol (PROVENTIL HFA;VENTOLIN HFA) 108 (90 BASE) MCG/ACT inhaler Inhale 2 puffs into the lungs every 6 (six) hours as needed for wheezing or shortness of breath. 1 Inhaler 2  . amitriptyline (ELAVIL) 10 MG tablet Take 2 tablets (20 mg total) by mouth at bedtime. 60 tablet 2  . aspirin 325 MG tablet Take 325 mg by mouth daily.    Marland Kitchen atorvastatin (LIPITOR) 40 MG tablet TAKE 1 TABLET (40 MG TOTAL) BY MOUTH DAILY. 30 tablet 5  . bimatoprost (LUMIGAN) 0.03 % ophthalmic solution Place 1 drop into both eyes at bedtime.    . brinzolamide (AZOPT)  1 % ophthalmic suspension Place 1 drop into both eyes 2 (two) times daily.     . Coenzyme Q10 (CO Q10) 100 MG CAPS Take 1 capsule by mouth daily.    . cyanocobalamin 2000 MCG tablet Take 2,000 mcg by mouth daily.    . fluticasone (FLONASE) 50 MCG/ACT nasal spray PLACE 2 SPRAYS INTO THE NOSE DAILY. 16 g 2  . fluticasone (FLOVENT HFA) 110 MCG/ACT inhaler Inhale 2 puffs into the lungs 2 (two) times daily. 1 Inhaler 2  . irbesartan (AVAPRO) 150 MG tablet TAKE 1 TABLET BY MOUTH EVERY DAY 90 tablet 1  . irbesartan (AVAPRO) 150 MG tablet TAKE 1 TABLET BY MOUTH EVERY DAY 90 tablet 1  . levothyroxine (SYNTHROID, LEVOTHROID) 100 MCG tablet TAKE 1 TABLET (100 MCG TOTAL) BY MOUTH DAILY BEFORE BREAKFAST. 90 tablet 1  . NAMENDA 10 MG tablet TAKE 1 TABLET BY MOUTH TWICE A DAY 180 tablet 1  . psyllium (METAMUCIL) 0.52 G capsule 4 capsules bid    . ranitidine (ZANTAC) 300 MG tablet Take 1 tablet (300 mg total) by mouth daily. 30 tablet 5  . SYNTHROID 112 MCG tablet TAKE 1 TABLET (112 MCG TOTAL) BY MOUTH DAILY. 90 tablet 1   No current facility-administered medications on file prior to visit.  Review of Systems  Constitutional: Positive for fatigue. Negative for appetite change and unexpected weight change.  HENT: Negative for congestion and sinus pressure.   Respiratory: Negative for cough, chest tightness and shortness of breath.   Cardiovascular: Negative for chest pain, palpitations and leg swelling.  Gastrointestinal: Negative for nausea, vomiting, abdominal pain and diarrhea.  Musculoskeletal: Negative for joint swelling and neck pain.  Skin: Negative for color change and rash.  Neurological: Negative for dizziness, light-headedness and headaches.  Hematological: Negative for adenopathy. Does not bruise/bleed easily.  Psychiatric/Behavioral:       Increased depression.  No suicidal ideations.         Objective:    Physical Exam  Constitutional: She appears well-developed and well-nourished.  No distress.  HENT:  Nose: Nose normal.  Mouth/Throat: Oropharynx is clear and moist.  Neck: Neck supple. No thyromegaly present.  Cardiovascular: Normal rate and regular rhythm.   Pulmonary/Chest: Breath sounds normal. No respiratory distress. She has no wheezes.  Abdominal: Soft. Bowel sounds are normal. There is no tenderness.  Musculoskeletal: She exhibits no edema or tenderness.  Lymphadenopathy:    She has no cervical adenopathy.  Skin: No rash noted. No erythema.  Psychiatric:  Depression.      BP 120/80 mmHg  Pulse 82  Temp(Src) 98.3 F (36.8 C) (Oral)  Ht 5' 2"  (1.575 m)  Wt 180 lb (81.647 kg)  BMI 32.91 kg/m2  SpO2 95% Wt Readings from Last 3 Encounters:  07/05/15 180 lb (81.647 kg)  07/04/15 180 lb (81.647 kg)  06/14/15 178 lb (80.74 kg)     Lab Results  Component Value Date   WBC 7.4 07/05/2015   HGB 12.0 07/05/2015   HCT 37.0 07/05/2015   PLT 203 07/05/2015   GLUCOSE 193* 07/05/2015   CHOL 137 02/28/2015   TRIG 90.0 02/28/2015   HDL 51.90 02/28/2015   LDLDIRECT 183.4 08/31/2013   LDLCALC 67 02/28/2015   ALT 33 02/28/2015   AST 29 02/28/2015   NA 137 07/05/2015   K 4.2 07/05/2015   CL 105 07/05/2015   CREATININE 0.81 07/05/2015   BUN 18 07/05/2015   CO2 24 07/05/2015   TSH 1.19 02/28/2015   INR 1.1 02/05/2014   HGBA1C 7.3* 02/28/2015   MICROALBUR 0.5 01/04/2014       Assessment & Plan:   Problem List Items Addressed This Visit    CVA (cerebral vascular accident)    Followed by neurology.  Continue daily aspirin.  Has sitters.  Follow.        Depression - Primary    Increased depression as outlined.  Not suicidal.  Increase zoloft to 143m q day.  Discussed at length with her today.  Refer to psychiatry.  Also, discussed with her son regarding letter her see her statement and be more involved with her affairs.        Relevant Medications   sertraline (ZOLOFT) 100 MG tablet   Other Relevant Orders   Ambulatory referral to Psychiatry    Diverticulosis    Colonoscopy 04/25/13 - diverticulosis, internal hemorrhoids.        Fatigue    Feel this is multifactorial.  Treat depression.  Check routine labs and b12.        Health care maintenance    Mammogram 02/26/15 - Birads I.  Colonoscopy 04/25/13 - diverticulosis and internal hemorrhoids.        Hypercholesterolemia    On lipitor.  Low cholesterol diet and exercise.  Follow lipid panel and  liver function tests.        Hyperglycemia    Low carb diet and exercise.  Follow met b and a1c.        Hypertension    Blood pressure is doing well.  Same medication regimen.  Follow pressures.  Follow metabolic panel.        Hypothyroidism    On thyroid replacement.  Follow tsh.       Obesity    Diet and exercise.          I spent 25 minutes with the patient and more than 50% of the time was spent in consultation regarding the above.     Einar Pheasant, MD

## 2015-07-04 NOTE — Progress Notes (Signed)
Pre visit review using our clinic review tool, if applicable. No additional management support is needed unless otherwise documented below in the visit note. 

## 2015-07-04 NOTE — Patient Instructions (Signed)
I am increasing zoloft to 100mg  per day.

## 2015-07-05 ENCOUNTER — Other Ambulatory Visit: Payer: Self-pay

## 2015-07-05 ENCOUNTER — Emergency Department: Payer: Medicare Other

## 2015-07-05 ENCOUNTER — Emergency Department
Admission: EM | Admit: 2015-07-05 | Discharge: 2015-07-05 | Disposition: A | Discharge status: Home / Self Care | Payer: Medicare Other | Attending: Emergency Medicine | Admitting: Emergency Medicine

## 2015-07-05 ENCOUNTER — Encounter: Payer: Self-pay | Admitting: Emergency Medicine

## 2015-07-05 DIAGNOSIS — N390 Urinary tract infection, site not specified: Secondary | ICD-10-CM | POA: Insufficient documentation

## 2015-07-05 DIAGNOSIS — S0003XA Contusion of scalp, initial encounter: Secondary | ICD-10-CM

## 2015-07-05 DIAGNOSIS — S5011XA Contusion of right forearm, initial encounter: Secondary | ICD-10-CM | POA: Diagnosis not present

## 2015-07-05 DIAGNOSIS — Y998 Other external cause status: Secondary | ICD-10-CM | POA: Diagnosis not present

## 2015-07-05 DIAGNOSIS — Y92002 Bathroom of unspecified non-institutional (private) residence single-family (private) house as the place of occurrence of the external cause: Secondary | ICD-10-CM | POA: Insufficient documentation

## 2015-07-05 DIAGNOSIS — Z79899 Other long term (current) drug therapy: Secondary | ICD-10-CM | POA: Diagnosis not present

## 2015-07-05 DIAGNOSIS — Z792 Long term (current) use of antibiotics: Secondary | ICD-10-CM | POA: Insufficient documentation

## 2015-07-05 DIAGNOSIS — Z7982 Long term (current) use of aspirin: Secondary | ICD-10-CM | POA: Diagnosis not present

## 2015-07-05 DIAGNOSIS — S0990XA Unspecified injury of head, initial encounter: Secondary | ICD-10-CM | POA: Diagnosis present

## 2015-07-05 DIAGNOSIS — S8011XA Contusion of right lower leg, initial encounter: Secondary | ICD-10-CM | POA: Insufficient documentation

## 2015-07-05 DIAGNOSIS — R51 Headache: Secondary | ICD-10-CM | POA: Diagnosis not present

## 2015-07-05 DIAGNOSIS — Y9389 Activity, other specified: Secondary | ICD-10-CM | POA: Insufficient documentation

## 2015-07-05 DIAGNOSIS — Z7951 Long term (current) use of inhaled steroids: Secondary | ICD-10-CM | POA: Insufficient documentation

## 2015-07-05 DIAGNOSIS — R319 Hematuria, unspecified: Secondary | ICD-10-CM

## 2015-07-05 DIAGNOSIS — S0101XA Laceration without foreign body of scalp, initial encounter: Secondary | ICD-10-CM | POA: Diagnosis not present

## 2015-07-05 DIAGNOSIS — S59911A Unspecified injury of right forearm, initial encounter: Secondary | ICD-10-CM | POA: Diagnosis not present

## 2015-07-05 DIAGNOSIS — M7989 Other specified soft tissue disorders: Secondary | ICD-10-CM | POA: Diagnosis not present

## 2015-07-05 DIAGNOSIS — S51801A Unspecified open wound of right forearm, initial encounter: Secondary | ICD-10-CM | POA: Diagnosis not present

## 2015-07-05 DIAGNOSIS — S098XXA Other specified injuries of head, initial encounter: Secondary | ICD-10-CM | POA: Diagnosis not present

## 2015-07-05 DIAGNOSIS — W010XXA Fall on same level from slipping, tripping and stumbling without subsequent striking against object, initial encounter: Secondary | ICD-10-CM | POA: Insufficient documentation

## 2015-07-05 DIAGNOSIS — I10 Essential (primary) hypertension: Secondary | ICD-10-CM | POA: Insufficient documentation

## 2015-07-05 DIAGNOSIS — W19XXXA Unspecified fall, initial encounter: Secondary | ICD-10-CM | POA: Diagnosis not present

## 2015-07-05 LAB — BASIC METABOLIC PANEL
Anion gap: 8 (ref 5–15)
BUN: 18 mg/dL (ref 6–20)
CO2: 24 mmol/L (ref 22–32)
Calcium: 8.6 mg/dL — ABNORMAL LOW (ref 8.9–10.3)
Chloride: 105 mmol/L (ref 101–111)
Creatinine, Ser: 0.81 mg/dL (ref 0.44–1.00)
GFR calc Af Amer: >60 mL/min (ref >60)
GFR calc non Af Amer: >60 mL/min (ref >60)
Glucose, Bld: 193 mg/dL — ABNORMAL HIGH (ref 65–99)
Potassium: 4.2 mmol/L (ref 3.5–5.1)
Sodium: 137 mmol/L (ref 135–145)

## 2015-07-05 LAB — CBC WITH DIFFERENTIAL/PLATELET
Basophils Absolute: 0.1 10*3/uL (ref 0–0.1)
Basophils Relative: 1 %
Eosinophils Absolute: 0.2 10*3/uL (ref 0–0.7)
Eosinophils Relative: 2 %
HCT: 37 % (ref 35.0–47.0)
Hemoglobin: 12 g/dL (ref 12.0–16.0)
Lymphocytes Relative: 15 %
Lymphs Abs: 1.1 10*3/uL (ref 1.0–3.6)
MCH: 27.4 pg (ref 26.0–34.0)
MCHC: 32.4 g/dL (ref 32.0–36.0)
MCV: 84.4 fL (ref 80.0–100.0)
Monocytes Absolute: 0.5 10*3/uL (ref 0.2–0.9)
Monocytes Relative: 6 %
Neutro Abs: 5.6 10*3/uL (ref 1.4–6.5)
Neutrophils Relative %: 76 %
Platelets: 203 10*3/uL (ref 150–440)
RBC: 4.38 MIL/uL (ref 3.80–5.20)
RDW: 16 % — ABNORMAL HIGH (ref 11.5–14.5)
WBC: 7.4 10*3/uL (ref 3.6–11.0)

## 2015-07-05 LAB — URINALYSIS COMPLETE WITH MICROSCOPIC (ARMC ONLY)
Bilirubin Urine: NEGATIVE
Glucose, UA: >500 mg/dL — AB
Ketones, ur: NEGATIVE mg/dL
Nitrite: POSITIVE — AB
Protein, ur: 30 mg/dL — AB
Specific Gravity, Urine: 1.021 (ref 1.005–1.030)
pH: 5 (ref 5.0–8.0)

## 2015-07-05 LAB — TROPONIN I: Troponin I: <0.03 ng/mL (ref <0.031)

## 2015-07-05 MED ORDER — LIDOCAINE HCL (PF) 1 % IJ SOLN
INTRAMUSCULAR | Status: AC
  Administered 2015-07-05: 2 mL
  Filled 2015-07-05: qty 5

## 2015-07-05 MED ORDER — CEFTRIAXONE SODIUM 1 G IJ SOLR
INTRAMUSCULAR | Status: AC
  Administered 2015-07-05: 1 g via INTRAMUSCULAR
  Filled 2015-07-05: qty 10

## 2015-07-05 MED ORDER — CEFTRIAXONE SODIUM IN DEXTROSE 20 MG/ML IV SOLN: 1.0000 g | Freq: Once | INTRAVENOUS | Status: DC

## 2015-07-05 MED ORDER — CEFTRIAXONE SODIUM 1 G IJ SOLR
1.0000 g | Freq: Once | INTRAMUSCULAR | Status: AC
  Administered 2015-07-05: 1 g via INTRAMUSCULAR

## 2015-07-05 MED ORDER — CEFPODOXIME PROXETIL 200 MG PO TABS: 200.0000 mg | ORAL_TABLET | Freq: Two times a day (BID) | ORAL | Status: DC

## 2015-07-05 MED ORDER — LIDOCAINE HCL (PF) 1 % IJ SOLN
2.0000 mL | Freq: Once | INTRAMUSCULAR | Status: AC
  Administered 2015-07-05: 2 mL

## 2015-07-05 NOTE — Telephone Encounter (Signed)
I saw her 07/04/15

## 2015-07-05 NOTE — Discharge Instructions (Signed)
Staples need to be removed in 10-14 days, your primary care doctor's office can do this.  You examine evaluation are reassuring for no severe trauma. Return to the emergency department for any new or worsening condition including confusion, altered mental status, chest pain, abdominal pain, weakness, numbness, or any other symptoms concerning to you.   Contusion A contusion is a deep bruise. Contusions are the result of an injury that caused bleeding under the skin. The contusion may turn blue, purple, or yellow. Minor injuries will give you a painless contusion, but more severe contusions may stay painful and swollen for a few weeks.  CAUSES  A contusion is usually caused by a blow, trauma, or direct force to an area of the body. SYMPTOMS   Swelling and redness of the injured area.  Bruising of the injured area.  Tenderness and soreness of the injured area.  Pain. DIAGNOSIS  The diagnosis can be made by taking a history and physical exam. An X-ray, CT scan, or MRI may be needed to determine if there were any associated injuries, such as fractures. TREATMENT  Specific treatment will depend on what area of the body was injured. In general, the best treatment for a contusion is resting, icing, elevating, and applying cold compresses to the injured area. Over-the-counter medicines may also be recommended for pain control. Ask your caregiver what the best treatment is for your contusion. HOME CARE INSTRUCTIONS   Put ice on the injured area.  Put ice in a plastic bag.  Place a towel between your skin and the bag.  Leave the ice on for 15-20 minutes, 3-4 times a day, or as directed by your health care provider.  Only take over-the-counter or prescription medicines for pain, discomfort, or fever as directed by your caregiver. Your caregiver may recommend avoiding anti-inflammatory medicines (aspirin, ibuprofen, and naproxen) for 48 hours because these medicines may increase bruising.  Rest  the injured area.  If possible, elevate the injured area to reduce swelling. SEEK IMMEDIATE MEDICAL CARE IF:   You have increased bruising or swelling.  You have pain that is getting worse.  Your swelling or pain is not relieved with medicines. MAKE SURE YOU:   Understand these instructions.  Will watch your condition.  Will get help right away if you are not doing well or get worse. Document Released: 09/23/2005 Document Revised: 12/19/2013 Document Reviewed: 10/19/2011 [facility] Patient Information 2015 [place], [place]. This information is not intended to replace advice given to you by your health care provider. Make sure you discuss any questions you have with your health care provider.  Facial or Scalp Contusion  A facial or scalp contusion is a deep bruise on the face or head. Contusions happen when an injury causes bleeding under the skin. Signs of bruising include pain, puffiness (swelling), and discolored skin. The contusion may turn blue, purple, or yellow. HOME CARE  Only take medicines as told by your doctor.  Put ice on the injured area.  Put ice in a plastic bag.  Place a towel between your skin and the bag.  Leave the ice on for 20 minutes, 2-3 times a day. GET HELP IF:  You have bite problems.  You have pain when chewing.  You are worried about your face not healing normally. GET HELP RIGHT AWAY IF:   You have severe pain or a headache and medicine does not help.  You are very tired or confused, or your personality changes.  You throw up (vomit).  You have a nosebleed that will not stop.  You see two of everything (double vision) or have blurry vision.  You have fluid coming from your nose or ear.  You have problems walking or using your arms or legs. MAKE SURE YOU:   Understand these instructions.  Will watch your condition.  Will get help right away if you are not doing well or get worse. Document Released: 12/03/2011 Document Revised:  10/04/2013 Document Reviewed: 07/27/2013 [facility] Patient Information 2015 [place], [place]. This information is not intended to replace advice given to you by your health care provider. Make sure you discuss any questions you have with your health care provider.  Laceration Care, Adult A laceration is a cut that goes through all layers of the skin. The cut goes into the tissue beneath the skin. HOME CARE For stitches (sutures) or staples:  Keep the cut clean and dry.  If you have a bandage (dressing), change it at least once a day. Change the bandage if it gets wet or dirty, or as told by your doctor.  Wash the cut with soap and water 2 times a day. Rinse the cut with water. Pat it dry with a clean towel.  Put a thin layer of medicated cream on the cut as told by your doctor.  You may shower after the first 24 hours. Do not soak the cut in water until the stitches are removed.  Only take medicines as told by your doctor.  Have your stitches or staples removed as told by your doctor. For skin adhesive strips:  Keep the cut clean and dry.  Do not get the strips wet. You may take a bath, but be careful to keep the cut dry.  If the cut gets wet, pat it dry with a clean towel.  The strips will fall off on their own. Do not remove the strips that are still stuck to the cut. For wound glue:  You may shower or take baths. Do not soak or scrub the cut. Do not swim. Avoid heavy sweating until the glue falls off on its own. After a shower or bath, pat the cut dry with a clean towel.  Do not put medicine on your cut until the glue falls off.  If you have a bandage, do not put tape over the glue.  Avoid lots of sunlight or tanning lamps until the glue falls off. Put sunscreen on the cut for the first year to reduce your scar.  The glue will fall off on its own. Do not pick at the glue. You may need a tetanus shot if:  You cannot remember when you had your last tetanus shot.  You have  never had a tetanus shot. If you need a tetanus shot and you choose not to have one, you may get tetanus. Sickness from tetanus can be serious. GET HELP RIGHT AWAY IF:   Your pain does not get better with medicine.  Your arm, hand, leg, or foot loses feeling (numbness) or changes color.  Your cut is bleeding.  Your joint feels weak, or you cannot use your joint.  You have painful lumps on your body.  Your cut is red, puffy (swollen), or painful.  You have a red line on the skin near the cut.  You have yellowish-white fluid (pus) coming from the cut.  You have a fever.  You have a bad smell coming from the cut or bandage.  Your cut breaks open before or after stitches  are removed.  You notice something coming out of the cut, such as wood or glass.  You cannot move a finger or toe. MAKE SURE YOU:   Understand these instructions.  Will watch your condition.  Will get help right away if you are not doing well or get worse. Document Released: 06/01/2008 Document Revised: 03/07/2012 Document Reviewed: 06/09/2011 [facility] Patient Information 2015 [place], [place]. This information is not intended to replace advice given to you by your health care provider. Make sure you discuss any questions you have with your health care provider.

## 2015-07-05 NOTE — Telephone Encounter (Signed)
Pt cancelled appointment with me [facility]

## 2015-07-05 NOTE — ED Notes (Signed)
CT transporting pt to CT scanner

## 2015-07-05 NOTE — ED Notes (Signed)
Pt was coming out of bathroom and tripped over cane and fell. Pt does not remember what happened after she fell. Pt has hematoma to right forearm and laceration to left side of head. Pt is alert and oriented to person, place and time upon arrival to ER.

## 2015-07-05 NOTE — ED Notes (Signed)
Port xray at bedside.  

## 2015-07-05 NOTE — ED Provider Notes (Addendum)
[facility] Emergency Department Provider Note   ____________________________________________  Time seen: 1:50 PM I have reviewed the triage vital signs and the triage nursing note.  HISTORY  Chief Complaint Fall   Historian Patient, son, caregiver  HPI Sandra Kaufman is a 81 y.o. female who lives alone and has a caregiver come from 81 AM to 56 PM. Today is the caregivers were there she got up to the bathroom around noon and lost her balance and fell to the left side striking her scalp. Patient reports no chest pain, shortness breath, or confusion prior to losing her balance. This doesn't usually happen to her. Son reports that about 1 week ago she had an episode where she was acutely confused about needing to "go somewhere" however that episode past. There is no noted all her mental status today either before or after the accident. She's complaining of pain to the right forearm with a bruise. She is complaining of mild tenderness to the right tibia. No chest pain, shortness breath, or abdominal pain. No hip pain. Mild headache posteriorly. No neck pain and no back pain.   Past Medical History  Diagnosis Date  . Hypertension   . Hypercholesterolemia   . Hypothyroidism   . COPD (chronic obstructive pulmonary disease)   . GERD (gastroesophageal reflux disease)     with esophageal stricture requiring dilatation x 2 (12/96 and 10/99)  . Arthritis   . Glaucoma   . Peripheral neuropathy   . History of chicken pox   . Diverticulosis   . Migraines   . Colon polyps   . Urinary incontinence     Patient Active Problem List   Diagnosis Date Noted  . Cough 06/16/2015  . Health care maintenance 05/27/2015  . Unsteady gait 08/22/2014  . Obesity 08/22/2014  . Dysphagia 07/14/2014  . SOB (shortness of breath) 05/06/2014  . CVA (cerebral vascular accident) 03/04/2014  . Diverticulosis 04/29/2013  . GERD (gastroesophageal reflux disease) 11/27/2012  . Urinary  incontinence 11/27/2012  . Ovarian cyst 11/27/2012  . Depression 11/27/2012  . Neuropathy 11/27/2012  . Hypothyroidism 11/15/2012  . Hypercholesterolemia 11/15/2012  . Hyperglycemia 11/15/2012  . Hypertension 11/15/2012    Past Surgical History  Procedure Laterality Date  . Abdominal hysterectomy  1961    ovaries not removed  . Tonsillectomy    . Appendectomy    . Bladder tack  1976  . Cataract extraction  1997    bilateral  . Cholecystectomy    . Interstim implant placement  2011    Current Outpatient Rx  Name  Route  Sig  Dispense  Refill  . albuterol (PROVENTIL HFA;VENTOLIN HFA) 108 (90 BASE) MCG/ACT inhaler   Inhalation   Inhale 2 puffs into the lungs every 6 (six) hours as needed for wheezing or shortness of breath.   1 Inhaler   2   . amitriptyline (ELAVIL) 10 MG tablet   Oral   Take 2 tablets (20 mg total) by mouth at bedtime.   60 tablet   2   . aspirin 325 MG tablet   Oral   Take 325 mg by mouth daily.         Marland Kitchen atorvastatin (LIPITOR) 40 MG tablet      TAKE 1 TABLET (40 MG TOTAL) BY MOUTH DAILY.   30 tablet   5   . bimatoprost (LUMIGAN) 0.03 % ophthalmic solution   Both Eyes   Place 1 drop into both eyes at bedtime.         Marland Kitchen  brinzolamide (AZOPT) 1 % ophthalmic suspension   Both Eyes   Place 1 drop into both eyes 2 (two) times daily.          . Coenzyme Q10 (CO Q10) 100 MG CAPS   Oral   Take 1 capsule by mouth daily.         . cyanocobalamin 2000 MCG tablet   Oral   Take 2,000 mcg by mouth daily.         Marland Kitchen dexlansoprazole (DEXILANT) 60 MG capsule   Oral   Take 60 mg by mouth daily.         . fluticasone (FLONASE) 50 MCG/ACT nasal spray      PLACE 2 SPRAYS INTO THE NOSE DAILY.   16 g   2   . fluticasone (FLOVENT HFA) 110 MCG/ACT inhaler   Inhalation   Inhale 2 puffs into the lungs 2 (two) times daily.   1 Inhaler   2   . irbesartan (AVAPRO) 150 MG tablet      TAKE 1 TABLET BY MOUTH EVERY DAY   90 tablet   1   .  levothyroxine (SYNTHROID, LEVOTHROID) 88 MCG tablet   Oral   Take 88 mcg by mouth daily before breakfast.         . losartan (COZAAR) 50 MG tablet   Oral   Take 150 mg by mouth daily.         . mirabegron ER (MYRBETRIQ) 25 MG TB24 tablet   Oral   Take 25 mg by mouth daily.         Marland Kitchen NAMENDA 10 MG tablet      TAKE 1 TABLET BY MOUTH TWICE A DAY   180 tablet   1   . psyllium (METAMUCIL) 0.52 G capsule      4 capsules bid         . ranitidine (ZANTAC) 300 MG tablet   Oral   Take 1 tablet (300 mg total) by mouth daily.   30 tablet   5   . sertraline (ZOLOFT) 100 MG tablet   Oral   Take 1 tablet (100 mg total) by mouth daily.   30 tablet   2   . cefpodoxime (VANTIN) 200 MG tablet   Oral   Take 1 tablet (200 mg total) by mouth 2 (two) times daily.   14 tablet   0   . irbesartan (AVAPRO) 150 MG tablet      TAKE 1 TABLET BY MOUTH EVERY DAY   90 tablet   1     PLEASE REFILL IRBESARTAN   . levothyroxine (SYNTHROID, LEVOTHROID) 100 MCG tablet      TAKE 1 TABLET (100 MCG TOTAL) BY MOUTH DAILY BEFORE BREAKFAST.   90 tablet   1     PT IS OUT NEEDS FOR TOMORROW   . SYNTHROID 112 MCG tablet      TAKE 1 TABLET (112 MCG TOTAL) BY MOUTH DAILY.   90 tablet   1     Dispense as written.     Allergies Tramadol  Family History  Problem Relation Age of Onset  . Heart disease Father     myocardial infarction  . Arthritis/Rheumatoid Mother   . Diabetes Sister   . Epilepsy Sister   . Breast cancer Neg Hx   . Colon cancer Neg Hx     Social History History  Substance Use Topics  . Smoking status: Never Smoker   . Smokeless tobacco: Never Used  .  Alcohol Use: No    Review of Systems  Constitutional: Negative for fever. Eyes: Negative for visual changes. ENT: Negative for sore throat. Cardiovascular: Negative for chest pain. Respiratory: Negative for shortness of breath. Gastrointestinal: Negative for abdominal pain, vomiting and  diarrhea. Genitourinary: Negative for dysuria. Musculoskeletal: Negative for back pain. Skin: Negative for rash. Neurological: Negative for focal weakness or numbness. 10 point Review of Systems otherwise negative ____________________________________________   PHYSICAL EXAM:  VITAL SIGNS: ED Triage Vitals  Enc Vitals Group     BP 07/05/15 1254 153/85 mmHg     Pulse Rate 07/05/15 1240 90     Resp 07/05/15 1240 13     Temp 07/05/15 1240 98.5 F (36.9 C)     Temp Source 07/05/15 1240 Oral     SpO2 07/05/15 1237 94 %     Weight 07/05/15 1240 180 lb (81.647 kg)     Height 07/05/15 1240 5\' 3"  (1.6 m)     Head Cir --      Peak Flow --      Pain Score 07/05/15 1247 5     Pain Loc --      Pain Edu? --      Excl. in [place]? --      Constitutional: Alert and oriented. Well appearing and in no distress. Eyes: Conjunctivae are normal. PERRL. Normal extraocular movements. ENT   Head: Normocephalic. Left posterior scalp laceration 4 cm, hemostatic.   Nose: No congestion/rhinnorhea.   Mouth/Throat: Mucous membranes are moist.   Neck: No stridor. No midline C-spine step-offs or tenderness to palpation Cardiovascular/Chest: Normal rate, regular rhythm.  No murmurs, rubs, or gallops. Respiratory: Normal respiratory effort without tachypnea nor retractions. Breath sounds are clear and equal bilaterally. No wheezes/rales/rhonchi. Gastrointestinal: Soft. No distention, no guarding, no rebound. Obese and nontender  Genitourinary/rectal:Deferred Musculoskeletal: Nontender with normal range of motion in all extremities. No joint effusions. Moderate ecchymosis right forearm with tenderness to palpation. Neurovascularly intact. Very minor bruising right shin with no bony tenderness. Neurologically intact vascular intact Neurologic:  Normal speech and language. No gross focal neurologic deficits are appreciated. Skin:  Skin is warm, dry and intact. No rash noted. Psychiatric: Mood and  affect are normal. Speech and behavior are normal. Patient exhibits appropriate insight and judgment.  ____________________________________________   EKG I, Lisa Roca, MD, the attending physician have personally viewed and interpreted all ECGs.  80 bpm. Normal sinus rhythm. Normal axis. Normal ST and T-wave. Normal QRS. ____________________________________________  LABS (pertinent positives/negatives)  Metabolic panel within normal limits Troponin less than 0.03 CBC within normal limits Urinalysis nitrate positive, leukocytes positive, positive for bacteria and white blood cells  ____________________________________________  RADIOLOGY All Xrays were viewed by me. Imaging interpreted by Radiologist.  CT head:  stable atrophy. No acute process X-ray right forearm: No acute fractures or luxation, soft tissue swelling mid dorsal forearm __________________________________________  PROCEDURES  Procedure(s) performed: LACERATION REPAIR Performed by: Lisa Roca Authorized by: Lisa Roca Consent: Verbal consent obtained. Risks and benefits: risks, benefits and alternatives were discussed Consent given by: patient Patient identity confirmed: provided demographic data Prepped and Draped in normal sterile fashion Wound explored  Laceration Location: Left scalp  Laceration Length: 4 cm  No Foreign Bodies seen or palpated  Irrigation method: syringe Amount of cleaning: standard  Skin closure: Staples   Number of staples: 5   Patient tolerance: Patient tolerated the procedure well with no immediate complications.  Critical Care performed: None  ____________________________________________   ED COURSE /  ASSESSMENT AND PLAN  CONSULTATIONS: None  Pertinent labs & imaging results that were available during my care of the patient were reviewed by me and considered in my medical decision making (see chart for details).  No severe traumatic injury on evaluation.  Laceration repaired with staples. Found that her positive UTI patient was given Rocephin IV in the emergency department followed by prescription for cefpodoxime.  Patient is overall stable and think she is okay for outpatient treatment.   Patient / Family / Caregiver informed of clinical course, medical decision-making process, and agree with plan.   I discussed return precautions, follow-up instructions, and discharged instructions with patient and/or family.  ___________________________________________   FINAL CLINICAL IMPRESSION(S) / ED DIAGNOSES   Final diagnoses:  Scalp laceration, initial encounter  Traumatic ecchymosis of right forearm, initial encounter  Hematoma of scalp, initial encounter  Urinary tract infection with hematuria, site unspecified    FOLLOW UP  Referred to: Patient's primary care physician.   Lisa Roca, MD 07/05/15 1516  Lisa Roca, MD 07/05/15 [place], MD 07/05/15 [phone]

## 2015-07-07 ENCOUNTER — Encounter: Payer: Self-pay | Admitting: Internal Medicine

## 2015-07-07 DIAGNOSIS — R5383 Other fatigue: Secondary | ICD-10-CM | POA: Insufficient documentation

## 2015-07-07 NOTE — Assessment & Plan Note (Signed)
On lipitor.  Low cholesterol diet and exercise.  Follow lipid panel and liver function tests.   

## 2015-07-07 NOTE — Assessment & Plan Note (Signed)
Low carb diet and exercise.  Follow met b and a1c.   

## 2015-07-07 NOTE — Assessment & Plan Note (Signed)
On thyroid replacement.  Follow tsh.  

## 2015-07-07 NOTE — Assessment & Plan Note (Addendum)
Mammogram 02/26/15 - Birads I.  Colonoscopy 04/25/13 - diverticulosis and internal hemorrhoids.

## 2015-07-07 NOTE — Assessment & Plan Note (Signed)
Diet and exercise.   

## 2015-07-07 NOTE — Assessment & Plan Note (Signed)
Increased depression as outlined.  Not suicidal.  Increase zoloft to 100mg  q day.  Discussed at length with her today.  Refer to psychiatry.  Also, discussed with her son regarding letter her see her statement and be more involved with her affairs.

## 2015-07-07 NOTE — Assessment & Plan Note (Signed)
Followed by neurology.  Continue daily aspirin.  Has sitters.  Follow.

## 2015-07-07 NOTE — Assessment & Plan Note (Signed)
Feel this is multifactorial.  Treat depression.  Check routine labs and b12.

## 2015-07-07 NOTE — Assessment & Plan Note (Signed)
Colonoscopy 04/25/13 - diverticulosis, internal hemorrhoids.

## 2015-07-07 NOTE — Assessment & Plan Note (Signed)
Blood pressure is doing well.  Same medication regimen.  Follow pressures.  Follow metabolic panel.   

## 2015-07-10 ENCOUNTER — Telehealth: Payer: Self-pay | Admitting: Internal Medicine

## 2015-07-10 NOTE — Telephone Encounter (Signed)
I spoke to Sandra Kaufman about removing the staples.  She is ok with seeing her to remove the staples.  I just saw her for her other issues and we adjusted medication , etc.  She has a f/u with me regarding her other issues.  This visit [name] will see her for the ER f/u and remove the staples.  [name] ok with this.  10 days would be 07/15/15.  Please schedule and confirm we have staple remover kit here.  Thanks.

## 2015-07-10 NOTE — Telephone Encounter (Signed)
Pt needs ER follow up and poss staple removal. Please advise where to add pt/msn

## 2015-07-10 NOTE — Telephone Encounter (Signed)
Reviewed ER records.  Fell.  Appears had staples placed in her scalp.  Confirm doing ok.  When does she need these removed?

## 2015-07-10 NOTE — Telephone Encounter (Signed)
Per AVS: Staples need to be removed in 10-14 days, your primary care doctor's office can do this. Pt doing okay

## 2015-07-11 NOTE — Telephone Encounter (Signed)
Pt was scheduled for 2pm on Monday 7/18

## 2015-07-15 ENCOUNTER — Ambulatory Visit (INDEPENDENT_AMBULATORY_CARE_PROVIDER_SITE_OTHER): Payer: Medicare Other | Admitting: Nurse Practitioner

## 2015-07-15 VITALS — BP 120/66 | HR 97 | Temp 98.5°F | Resp 14 | Ht 65.0 in | Wt 177.6 lb

## 2015-07-15 DIAGNOSIS — L609 Nail disorder, unspecified: Secondary | ICD-10-CM

## 2015-07-15 DIAGNOSIS — Z4802 Encounter for removal of sutures: Secondary | ICD-10-CM

## 2015-07-15 DIAGNOSIS — L602 Onychogryphosis: Secondary | ICD-10-CM

## 2015-07-15 DIAGNOSIS — I639 Cerebral infarction, unspecified: Secondary | ICD-10-CM | POA: Diagnosis not present

## 2015-07-15 NOTE — Patient Instructions (Signed)
We will contact you in about 1 week to set up your podiatry referral.   Call us if you notice any pain, swelling, tenderness, purulent discharge, or warmth of the wound.

## 2015-07-15 NOTE — Progress Notes (Signed)
   Subjective:    Patient ID: Sandra Kaufman, female    DOB: 1932-03-24, 81 y.o.   MRN: [id number]  HPI  Sandra Kaufman is a 81 yo female with a CC of staple removal and ER follow up. She is accompanied by her care giver.   1) Removed 5 staples from the left center skull area  Crusted area, No difficulty with removal. Ointment placed.   Review of Systems  Constitutional: Negative for fever, chills, diaphoresis and fatigue.  Respiratory: Negative for chest tightness, shortness of breath and wheezing.   Cardiovascular: Negative for chest pain, palpitations and leg swelling.  Gastrointestinal: Negative for nausea, vomiting and diarrhea.  Skin: Negative for rash.  Neurological: Negative for dizziness, weakness, numbness and headaches.  Psychiatric/Behavioral: Positive for confusion. The patient is not nervous/anxious.        Normal pleasant confusion      Objective:   Physical Exam  Constitutional: She is oriented to person, place, and time. She appears well-developed and well-nourished. No distress.  BP 120/66 mmHg  Pulse 97  Temp(Src) 98.5 F (36.9 C)  Resp 14  Ht 5\' 5"  (1.651 m)  Wt 177 lb 9.6 oz (80.559 kg)  BMI 29.55 kg/m2  SpO2 96%   HENT:  Head: Normocephalic and atraumatic.    Right Ear: External ear normal.  Left Ear: External ear normal.  Crusted dried blood and 5 staples. Sight is not oozing or draining  Cardiovascular: Normal rate, regular rhythm, normal heart sounds and intact distal pulses.  Exam reveals no gallop and no friction rub.   No murmur heard. Pulmonary/Chest: Effort normal and breath sounds normal. No respiratory distress. She has no wheezes. She has no rales. She exhibits no tenderness.  Neurological: She is alert and oriented to person, place, and time. No cranial nerve deficit. She exhibits normal muscle tone. Coordination normal.  Skin: Skin is warm and dry. No rash noted. She is not diaphoretic.  Psychiatric: She has a normal mood and affect. Her  behavior is normal. Judgment and thought content normal.      Assessment & Plan:

## 2015-07-15 NOTE — Progress Notes (Signed)
Pre visit review using our clinic review tool, if applicable. No additional management support is needed unless otherwise documented below in the visit note. 

## 2015-07-23 ENCOUNTER — Encounter: Payer: Self-pay | Admitting: Nurse Practitioner

## 2015-07-23 DIAGNOSIS — I10 Essential (primary) hypertension: Secondary | ICD-10-CM | POA: Diagnosis not present

## 2015-07-23 DIAGNOSIS — Z8673 Personal history of transient ischemic attack (TIA), and cerebral infarction without residual deficits: Secondary | ICD-10-CM | POA: Diagnosis not present

## 2015-07-23 DIAGNOSIS — L602 Onychogryphosis: Secondary | ICD-10-CM | POA: Insufficient documentation

## 2015-07-23 DIAGNOSIS — R413 Other amnesia: Secondary | ICD-10-CM | POA: Diagnosis not present

## 2015-07-23 DIAGNOSIS — Z4802 Encounter for removal of sutures: Secondary | ICD-10-CM | POA: Insufficient documentation

## 2015-07-23 DIAGNOSIS — R296 Repeated falls: Secondary | ICD-10-CM | POA: Diagnosis not present

## 2015-07-23 NOTE — Assessment & Plan Note (Signed)
Referral to podiatry. Trimmed pinky toenail on right foot due to it bothering the pt and the caregiver was unable to provide foot care.

## 2015-07-23 NOTE — Assessment & Plan Note (Signed)
Verified staple number in the chart (5) and removed 5 staples easily and without too much pt discomfort. Site looked good and was covered in thin layer of ointment before leaving. Asked caregiver to look out for infection symptoms and gave her those on the handout.

## 2015-07-24 ENCOUNTER — Other Ambulatory Visit: Payer: Self-pay | Admitting: Internal Medicine

## 2015-08-16 ENCOUNTER — Encounter: Payer: Self-pay | Admitting: Internal Medicine

## 2015-08-16 DIAGNOSIS — I69393 Ataxia following cerebral infarction: Secondary | ICD-10-CM | POA: Diagnosis not present

## 2015-08-16 DIAGNOSIS — I69892 Facial weakness following other cerebrovascular disease: Secondary | ICD-10-CM | POA: Diagnosis not present

## 2015-08-16 DIAGNOSIS — E538 Deficiency of other specified B group vitamins: Secondary | ICD-10-CM | POA: Diagnosis not present

## 2015-08-16 DIAGNOSIS — R413 Other amnesia: Secondary | ICD-10-CM | POA: Diagnosis not present

## 2015-08-16 DIAGNOSIS — M1991 Primary osteoarthritis, unspecified site: Secondary | ICD-10-CM | POA: Diagnosis not present

## 2015-08-16 DIAGNOSIS — Z9181 History of falling: Secondary | ICD-10-CM | POA: Diagnosis not present

## 2015-08-16 DIAGNOSIS — I10 Essential (primary) hypertension: Secondary | ICD-10-CM | POA: Diagnosis not present

## 2015-08-16 DIAGNOSIS — J449 Chronic obstructive pulmonary disease, unspecified: Secondary | ICD-10-CM | POA: Diagnosis not present

## 2015-08-20 ENCOUNTER — Telehealth: Payer: Self-pay | Admitting: Internal Medicine

## 2015-08-20 ENCOUNTER — Ambulatory Visit: Payer: Medicare Other | Admitting: Internal Medicine

## 2015-08-22 DIAGNOSIS — R739 Hyperglycemia, unspecified: Secondary | ICD-10-CM | POA: Diagnosis not present

## 2015-08-22 DIAGNOSIS — R112 Nausea with vomiting, unspecified: Secondary | ICD-10-CM | POA: Diagnosis not present

## 2015-08-23 DIAGNOSIS — I10 Essential (primary) hypertension: Secondary | ICD-10-CM | POA: Diagnosis not present

## 2015-08-23 DIAGNOSIS — M1991 Primary osteoarthritis, unspecified site: Secondary | ICD-10-CM | POA: Diagnosis not present

## 2015-08-23 DIAGNOSIS — I69393 Ataxia following cerebral infarction: Secondary | ICD-10-CM | POA: Diagnosis not present

## 2015-08-23 DIAGNOSIS — J449 Chronic obstructive pulmonary disease, unspecified: Secondary | ICD-10-CM | POA: Diagnosis not present

## 2015-08-23 DIAGNOSIS — R413 Other amnesia: Secondary | ICD-10-CM | POA: Diagnosis not present

## 2015-08-23 DIAGNOSIS — I69892 Facial weakness following other cerebrovascular disease: Secondary | ICD-10-CM | POA: Diagnosis not present

## 2015-08-26 ENCOUNTER — Telehealth: Payer: Self-pay | Admitting: Internal Medicine

## 2015-08-26 NOTE — Telephone Encounter (Signed)
Pt stated that a Dr from [facility] called about her being seen with having isuses with  High blood sugar. Pt son would like to make appt. Please advise pt son

## 2015-08-26 NOTE — Telephone Encounter (Signed)
I can see her 09/05/15 - 4:30.  Confirm pt doing ok and confirm appt time ok.

## 2015-08-27 NOTE — Telephone Encounter (Signed)
Pt confirmed that day and time. Appointment made.Marland KitchenMarland Kitchen

## 2015-08-28 DIAGNOSIS — M1991 Primary osteoarthritis, unspecified site: Secondary | ICD-10-CM | POA: Diagnosis not present

## 2015-08-28 DIAGNOSIS — I10 Essential (primary) hypertension: Secondary | ICD-10-CM | POA: Diagnosis not present

## 2015-08-28 DIAGNOSIS — I69892 Facial weakness following other cerebrovascular disease: Secondary | ICD-10-CM | POA: Diagnosis not present

## 2015-08-28 DIAGNOSIS — J449 Chronic obstructive pulmonary disease, unspecified: Secondary | ICD-10-CM | POA: Diagnosis not present

## 2015-08-28 DIAGNOSIS — I69393 Ataxia following cerebral infarction: Secondary | ICD-10-CM | POA: Diagnosis not present

## 2015-08-28 DIAGNOSIS — R413 Other amnesia: Secondary | ICD-10-CM | POA: Diagnosis not present

## 2015-08-29 DIAGNOSIS — J449 Chronic obstructive pulmonary disease, unspecified: Secondary | ICD-10-CM | POA: Diagnosis not present

## 2015-08-29 DIAGNOSIS — I69393 Ataxia following cerebral infarction: Secondary | ICD-10-CM | POA: Diagnosis not present

## 2015-08-29 DIAGNOSIS — I69892 Facial weakness following other cerebrovascular disease: Secondary | ICD-10-CM | POA: Diagnosis not present

## 2015-08-29 DIAGNOSIS — R413 Other amnesia: Secondary | ICD-10-CM | POA: Diagnosis not present

## 2015-08-29 DIAGNOSIS — I10 Essential (primary) hypertension: Secondary | ICD-10-CM | POA: Diagnosis not present

## 2015-08-29 DIAGNOSIS — M1991 Primary osteoarthritis, unspecified site: Secondary | ICD-10-CM | POA: Diagnosis not present

## 2015-08-30 NOTE — Telephone Encounter (Signed)
disregard

## 2015-09-01 ENCOUNTER — Encounter: Payer: Self-pay | Admitting: Internal Medicine

## 2015-09-05 ENCOUNTER — Encounter: Payer: Self-pay | Admitting: Internal Medicine

## 2015-09-05 ENCOUNTER — Ambulatory Visit (INDEPENDENT_AMBULATORY_CARE_PROVIDER_SITE_OTHER): Payer: Medicare Other | Admitting: Internal Medicine

## 2015-09-05 VITALS — BP 118/80 | HR 84 | Temp 99.3°F | Ht 65.0 in | Wt 183.4 lb

## 2015-09-05 DIAGNOSIS — I639 Cerebral infarction, unspecified: Secondary | ICD-10-CM

## 2015-09-05 DIAGNOSIS — F32A Depression, unspecified: Secondary | ICD-10-CM

## 2015-09-05 DIAGNOSIS — R413 Other amnesia: Secondary | ICD-10-CM | POA: Diagnosis not present

## 2015-09-05 DIAGNOSIS — I10 Essential (primary) hypertension: Secondary | ICD-10-CM | POA: Diagnosis not present

## 2015-09-05 DIAGNOSIS — M1991 Primary osteoarthritis, unspecified site: Secondary | ICD-10-CM | POA: Diagnosis not present

## 2015-09-05 DIAGNOSIS — K219 Gastro-esophageal reflux disease without esophagitis: Secondary | ICD-10-CM | POA: Diagnosis not present

## 2015-09-05 DIAGNOSIS — R1310 Dysphagia, unspecified: Secondary | ICD-10-CM

## 2015-09-05 DIAGNOSIS — F329 Major depressive disorder, single episode, unspecified: Secondary | ICD-10-CM

## 2015-09-05 DIAGNOSIS — J449 Chronic obstructive pulmonary disease, unspecified: Secondary | ICD-10-CM | POA: Diagnosis not present

## 2015-09-05 DIAGNOSIS — E039 Hypothyroidism, unspecified: Secondary | ICD-10-CM

## 2015-09-05 DIAGNOSIS — E7800 Pure hypercholesterolemia, unspecified: Secondary | ICD-10-CM

## 2015-09-05 DIAGNOSIS — E1140 Type 2 diabetes mellitus with diabetic neuropathy, unspecified: Secondary | ICD-10-CM

## 2015-09-05 DIAGNOSIS — I69393 Ataxia following cerebral infarction: Secondary | ICD-10-CM | POA: Diagnosis not present

## 2015-09-05 DIAGNOSIS — I69892 Facial weakness following other cerebrovascular disease: Secondary | ICD-10-CM | POA: Diagnosis not present

## 2015-09-05 NOTE — Patient Instructions (Signed)
As we discussed, I do not want you to drive.    Continue your medications as you are doing.    We are going to get you scheduled to be seen at [facility] for diabetes education.    We will also get you scheduled for follow up labs.

## 2015-09-05 NOTE — Progress Notes (Signed)
Pre-visit discussion using our clinic review tool. No additional management support is needed unless otherwise documented below in the visit note.  

## 2015-09-07 ENCOUNTER — Encounter: Payer: Self-pay | Admitting: Internal Medicine

## 2015-09-07 ENCOUNTER — Other Ambulatory Visit: Payer: Self-pay | Admitting: Internal Medicine

## 2015-09-07 NOTE — Progress Notes (Signed)
Patient ID: Sandra Kaufman, female   DOB: 1932/05/11, 81 y.o.   MRN: [id number]   Subjective:    Patient ID: Sandra Kaufman, female    DOB: May 07, 1932, 81 y.o.   MRN: [id number]  HPI  Patient here for a scheduled follow up.  She is accompanied by her son.  History obtained from both of them.  She feels she is getting around better.  Is working with physical therapy.  Son reports that therapy does not feel pt able to drive at this point.  I discussed this with her today as well.  We discussed my recommendation for her not to drive.  No headache.  No dizziness or light headedness.  No cardiac symptoms with increased activity or exertion.  She does still have issues with swallowing.  Food gets stuck.  Will have to regurgitate food at times.  No abdominal pain.  Bowels stable.  We also discussed her blood sugars and diet instructions.  Discussed lifestyles referral.     Past Medical History  Diagnosis Date  . Hypertension   . Hypercholesterolemia   . Hypothyroidism   . COPD (chronic obstructive pulmonary disease)   . GERD (gastroesophageal reflux disease)     with esophageal stricture requiring dilatation x 2 (12/96 and 10/99)  . Arthritis   . Glaucoma   . Peripheral neuropathy   . History of chicken pox   . Diverticulosis   . Migraines   . Colon polyps   . Urinary incontinence    Past Surgical History  Procedure Laterality Date  . Abdominal hysterectomy  1961    ovaries not removed  . Tonsillectomy    . Appendectomy    . Bladder tack  1976  . Cataract extraction  1997    bilateral  . Cholecystectomy    . Interstim implant placement  2011   Family History  Problem Relation Age of Onset  . Heart disease Father     myocardial infarction  . Arthritis/Rheumatoid Mother   . Diabetes Sister   . Epilepsy Sister   . Breast cancer Neg Hx   . Colon cancer Neg Hx    Social History   Social History  . Marital Status: Widowed    Spouse Name: N/A  . Number of Children: 2  . Years  of Education: N/A   Social History Main Topics  . Smoking status: Never Smoker   . Smokeless tobacco: Never Used  . Alcohol Use: No  . Drug Use: No  . Sexual Activity: Not Asked   Other Topics Concern  . None   Social History Narrative    Outpatient Encounter Prescriptions as of 09/05/2015  Medication Sig  . albuterol (PROVENTIL HFA;VENTOLIN HFA) 108 (90 BASE) MCG/ACT inhaler Inhale 2 puffs into the lungs every 6 (six) hours as needed for wheezing or shortness of breath.  Marland Kitchen amitriptyline (ELAVIL) 10 MG tablet TAKE 2 TABLETS (20 MG TOTAL) BY MOUTH AT BEDTIME.  Marland Kitchen aspirin 325 MG tablet Take 325 mg by mouth daily.  Marland Kitchen atorvastatin (LIPITOR) 40 MG tablet TAKE 1 TABLET (40 MG TOTAL) BY MOUTH DAILY.  . bimatoprost (LUMIGAN) 0.03 % ophthalmic solution Place 1 drop into both eyes at bedtime.  . brinzolamide (AZOPT) 1 % ophthalmic suspension Place 1 drop into both eyes 2 (two) times daily.   . cefpodoxime (VANTIN) 200 MG tablet Take 1 tablet (200 mg total) by mouth 2 (two) times daily.  . Coenzyme Q10 (CO Q10) 100 MG CAPS Take 1 capsule by  mouth daily.  . cyanocobalamin 2000 MCG tablet Take 2,000 mcg by mouth daily.  Marland Kitchen dexlansoprazole (DEXILANT) 60 MG capsule Take 60 mg by mouth daily.  . fluticasone (FLONASE) 50 MCG/ACT nasal spray PLACE 2 SPRAYS INTO THE NOSE DAILY.  . fluticasone (FLOVENT HFA) 110 MCG/ACT inhaler Inhale 2 puffs into the lungs 2 (two) times daily.  . irbesartan (AVAPRO) 150 MG tablet TAKE 1 TABLET BY MOUTH EVERY DAY  . irbesartan (AVAPRO) 150 MG tablet TAKE 1 TABLET BY MOUTH EVERY DAY  . levothyroxine (SYNTHROID, LEVOTHROID) 100 MCG tablet TAKE 1 TABLET (100 MCG TOTAL) BY MOUTH DAILY BEFORE BREAKFAST.  Marland Kitchen levothyroxine (SYNTHROID, LEVOTHROID) 88 MCG tablet Take 88 mcg by mouth daily before breakfast.  . losartan (COZAAR) 50 MG tablet Take 150 mg by mouth daily.  . mirabegron ER (MYRBETRIQ) 25 MG TB24 tablet Take 25 mg by mouth daily.  Marland Kitchen NAMENDA 10 MG tablet TAKE 1 TABLET BY  MOUTH TWICE A DAY  . psyllium (METAMUCIL) 0.52 G capsule 4 capsules bid  . ranitidine (ZANTAC) 300 MG tablet Take 1 tablet (300 mg total) by mouth daily.  . sertraline (ZOLOFT) 100 MG tablet Take 1 tablet (100 mg total) by mouth daily.  Marland Kitchen SYNTHROID 112 MCG tablet TAKE 1 TABLET (112 MCG TOTAL) BY MOUTH DAILY.   No facility-administered encounter medications on file as of 09/05/2015.    Review of Systems  Constitutional: Negative for appetite change and unexpected weight change.  HENT: Negative for congestion and sinus pressure.   Eyes: Negative for pain and discharge.  Respiratory: Negative for cough, chest tightness and shortness of breath.   Cardiovascular: Negative for chest pain, palpitations and leg swelling.  Gastrointestinal: Negative for nausea, vomiting, abdominal pain and diarrhea.       Does report problems swallowing.  Food gets stuck.  Has to regurgitate food at times.    Genitourinary: Negative for dysuria and difficulty urinating.  Skin: Negative for color change and rash.  Neurological: Negative for dizziness, light-headedness and headaches.  Hematological: Negative for adenopathy. Does not bruise/bleed easily.  Psychiatric/Behavioral: Negative for dysphoric mood and agitation.       Objective:    Physical Exam  Constitutional: She appears well-developed and well-nourished. No distress.  HENT:  Nose: Nose normal.  Mouth/Throat: Oropharynx is clear and moist.  Eyes: Conjunctivae are normal. Right eye exhibits no discharge. Left eye exhibits no discharge.  Neck: Neck supple. No thyromegaly present.  Cardiovascular: Normal rate and regular rhythm.   Pulmonary/Chest: Breath sounds normal. No respiratory distress. She has no wheezes.  Abdominal: Soft. Bowel sounds are normal. There is no tenderness.  Musculoskeletal: She exhibits no edema or tenderness.  Lymphadenopathy:    She has no cervical adenopathy.  Skin: No rash noted. No erythema.  Psychiatric: She has a  normal mood and affect. Her behavior is normal.    BP 118/80 mmHg  Pulse 84  Temp(Src) 99.3 F (37.4 C) (Oral)  Ht 5\' 5"  (1.651 m)  Wt 183 lb 6 oz (83.178 kg)  BMI 30.52 kg/m2  SpO2 96% Wt Readings from Last 3 Encounters:  09/05/15 183 lb 6 oz (83.178 kg)  07/15/15 177 lb 9.6 oz (80.559 kg)  07/05/15 180 lb (81.647 kg)     Lab Results  Component Value Date   WBC 7.4 07/05/2015   HGB 12.0 07/05/2015   HCT 37.0 07/05/2015   PLT 203 07/05/2015   GLUCOSE 193* 07/05/2015   CHOL 137 02/28/2015   TRIG 90.0 02/28/2015  HDL 51.90 02/28/2015   LDLDIRECT 183.4 08/31/2013   LDLCALC 67 02/28/2015   ALT 33 02/28/2015   AST 29 02/28/2015   NA 137 07/05/2015   K 4.2 07/05/2015   CL 105 07/05/2015   CREATININE 0.81 07/05/2015   BUN 18 07/05/2015   CO2 24 07/05/2015   TSH 1.19 02/28/2015   INR 1.1 02/05/2014   HGBA1C 7.3* 02/28/2015   MICROALBUR 0.5 01/04/2014       Assessment & Plan:   Problem List Items Addressed This Visit    CVA (cerebral vascular accident)    Followed by neurology.  On aspirin.  Has sitters.  Instructed not to drive.        Depression    Increased reactive depression after her stroke.  No able to do things she used to do.  On zoloft.  She feels things are stable and feels she is doing better.  Follow.       Diabetes mellitus    Discussed diet and exercise.  Discussed sugars.  Refer to [facility] for diabetes education and diet instruction.        Relevant Orders   Ambulatory referral to diabetic education   Dysphagia - Primary    Had EGD 10/25/14 - benign appearing stricture s/p dilatation, hiatal hernia other wise normal.  Having issues again with swallowing and food getting stuck.  Will have to regurgitate food at times.  Get her back to Dr Allen Norris.        Relevant Orders   Ambulatory referral to Gastroenterology   GERD (gastroesophageal reflux disease)    On dexilant and zantac.  EGD as outlined.  Trouble swallowing.  Refer back to Gi as  outlined.        Hypercholesterolemia    On lipitor.  Low cholesterol diet and exercise.  Follow lipid panel and liver function tests.        Hypertension    Blood pressure under good control.  Continue same medication regimen.  Follow pressures.  Follow metabolic panel.        Hypothyroidism    On thyroid replacement.  Follow tsh.       Relevant Orders   TSH       Einar Pheasant, MD

## 2015-09-07 NOTE — Assessment & Plan Note (Signed)
Increased reactive depression after her stroke.  No able to do things she used to do.  On zoloft.  She feels things are stable and feels she is doing better.  Follow.

## 2015-09-07 NOTE — Assessment & Plan Note (Signed)
On lipitor.  Low cholesterol diet and exercise.  Follow lipid panel and liver function tests.   

## 2015-09-07 NOTE — Assessment & Plan Note (Signed)
On dexilant and zantac.  EGD as outlined.  Trouble swallowing.  Refer back to Gi as outlined.

## 2015-09-07 NOTE — Assessment & Plan Note (Signed)
Discussed diet and exercise.  Discussed sugars.  Refer to [facility] for diabetes education and diet instruction.

## 2015-09-07 NOTE — Assessment & Plan Note (Signed)
Followed by neurology.  On aspirin.  Has sitters.  Instructed not to drive.

## 2015-09-07 NOTE — Assessment & Plan Note (Signed)
Had EGD 10/25/14 - benign appearing stricture s/p dilatation, hiatal hernia other wise normal.  Having issues again with swallowing and food getting stuck.  Will have to regurgitate food at times.  Get her back to Dr Allen Norris.

## 2015-09-07 NOTE — Assessment & Plan Note (Signed)
Blood pressure under good control.  Continue same medication regimen.  Follow pressures.  Follow metabolic panel.   

## 2015-09-07 NOTE — Assessment & Plan Note (Signed)
On thyroid replacement.  Follow tsh.  

## 2015-09-10 ENCOUNTER — Ambulatory Visit: Payer: Self-pay | Admitting: Psychiatry

## 2015-09-11 ENCOUNTER — Telehealth: Payer: Self-pay | Admitting: *Deleted

## 2015-09-11 DIAGNOSIS — J449 Chronic obstructive pulmonary disease, unspecified: Secondary | ICD-10-CM | POA: Diagnosis not present

## 2015-09-11 DIAGNOSIS — M1991 Primary osteoarthritis, unspecified site: Secondary | ICD-10-CM | POA: Diagnosis not present

## 2015-09-11 DIAGNOSIS — I10 Essential (primary) hypertension: Secondary | ICD-10-CM | POA: Diagnosis not present

## 2015-09-11 DIAGNOSIS — I69892 Facial weakness following other cerebrovascular disease: Secondary | ICD-10-CM | POA: Diagnosis not present

## 2015-09-11 DIAGNOSIS — I69393 Ataxia following cerebral infarction: Secondary | ICD-10-CM | POA: Diagnosis not present

## 2015-09-11 DIAGNOSIS — R413 Other amnesia: Secondary | ICD-10-CM | POA: Diagnosis not present

## 2015-09-11 NOTE — Addendum Note (Signed)
Addended by: Alisa Graff on: 09/11/2015 01:39 PM   Modules accepted: Miquel Dunn

## 2015-09-11 NOTE — Telephone Encounter (Signed)
Received a fax from [facility] stating that patient no showed for her appt on 09/10/15 @ 11am.

## 2015-09-11 NOTE — Telephone Encounter (Signed)
noted 

## 2015-09-13 DIAGNOSIS — M1991 Primary osteoarthritis, unspecified site: Secondary | ICD-10-CM | POA: Diagnosis not present

## 2015-09-13 DIAGNOSIS — I69892 Facial weakness following other cerebrovascular disease: Secondary | ICD-10-CM | POA: Diagnosis not present

## 2015-09-13 DIAGNOSIS — I69393 Ataxia following cerebral infarction: Secondary | ICD-10-CM | POA: Diagnosis not present

## 2015-09-13 DIAGNOSIS — I10 Essential (primary) hypertension: Secondary | ICD-10-CM | POA: Diagnosis not present

## 2015-09-13 DIAGNOSIS — J449 Chronic obstructive pulmonary disease, unspecified: Secondary | ICD-10-CM | POA: Diagnosis not present

## 2015-09-13 DIAGNOSIS — R413 Other amnesia: Secondary | ICD-10-CM | POA: Diagnosis not present

## 2015-09-16 DIAGNOSIS — R413 Other amnesia: Secondary | ICD-10-CM | POA: Diagnosis not present

## 2015-09-16 DIAGNOSIS — I69393 Ataxia following cerebral infarction: Secondary | ICD-10-CM | POA: Diagnosis not present

## 2015-09-16 DIAGNOSIS — M1991 Primary osteoarthritis, unspecified site: Secondary | ICD-10-CM | POA: Diagnosis not present

## 2015-09-16 DIAGNOSIS — I69892 Facial weakness following other cerebrovascular disease: Secondary | ICD-10-CM | POA: Diagnosis not present

## 2015-09-16 DIAGNOSIS — I10 Essential (primary) hypertension: Secondary | ICD-10-CM | POA: Diagnosis not present

## 2015-09-16 DIAGNOSIS — J449 Chronic obstructive pulmonary disease, unspecified: Secondary | ICD-10-CM | POA: Diagnosis not present

## 2015-09-18 ENCOUNTER — Other Ambulatory Visit (INDEPENDENT_AMBULATORY_CARE_PROVIDER_SITE_OTHER): Payer: Medicare Other

## 2015-09-18 DIAGNOSIS — E039 Hypothyroidism, unspecified: Secondary | ICD-10-CM

## 2015-09-18 DIAGNOSIS — R413 Other amnesia: Secondary | ICD-10-CM | POA: Diagnosis not present

## 2015-09-18 DIAGNOSIS — J449 Chronic obstructive pulmonary disease, unspecified: Secondary | ICD-10-CM | POA: Diagnosis not present

## 2015-09-18 DIAGNOSIS — I69393 Ataxia following cerebral infarction: Secondary | ICD-10-CM | POA: Diagnosis not present

## 2015-09-18 DIAGNOSIS — I69892 Facial weakness following other cerebrovascular disease: Secondary | ICD-10-CM | POA: Diagnosis not present

## 2015-09-18 DIAGNOSIS — D509 Iron deficiency anemia, unspecified: Secondary | ICD-10-CM

## 2015-09-18 DIAGNOSIS — R739 Hyperglycemia, unspecified: Secondary | ICD-10-CM

## 2015-09-18 DIAGNOSIS — E7800 Pure hypercholesterolemia, unspecified: Secondary | ICD-10-CM

## 2015-09-18 DIAGNOSIS — M1991 Primary osteoarthritis, unspecified site: Secondary | ICD-10-CM | POA: Diagnosis not present

## 2015-09-18 DIAGNOSIS — I10 Essential (primary) hypertension: Secondary | ICD-10-CM

## 2015-09-18 LAB — BASIC METABOLIC PANEL
BUN: 10 mg/dL (ref 6–23)
CO2: 28 mEq/L (ref 19–32)
Calcium: 8.8 mg/dL (ref 8.4–10.5)
Chloride: 105 mEq/L (ref 96–112)
Creatinine, Ser: 0.89 mg/dL (ref 0.40–1.20)
GFR: 64.65 mL/min (ref >60.00)
Glucose, Bld: 213 mg/dL — ABNORMAL HIGH (ref 70–99)
Potassium: 4 mEq/L (ref 3.5–5.1)
Sodium: 139 mEq/L (ref 135–145)

## 2015-09-18 LAB — CBC WITH DIFFERENTIAL/PLATELET
Basophils Absolute: 0.1 10*3/uL (ref 0.0–0.1)
Basophils Relative: 0.9 % (ref 0.0–3.0)
Eosinophils Absolute: 0.1 10*3/uL (ref 0.0–0.7)
Eosinophils Relative: 2.2 % (ref 0.0–5.0)
HCT: 33.5 % — ABNORMAL LOW (ref 36.0–46.0)
Hemoglobin: 11.1 g/dL — ABNORMAL LOW (ref 12.0–15.0)
Lymphocytes Relative: 17.7 % (ref 12.0–46.0)
Lymphs Abs: 1.2 10*3/uL (ref 0.7–4.0)
MCHC: 33.2 g/dL (ref 30.0–36.0)
MCV: 85.1 fl (ref 78.0–100.0)
Monocytes Absolute: 0.4 10*3/uL (ref 0.1–1.0)
Monocytes Relative: 6.7 % (ref 3.0–12.0)
Neutro Abs: 4.9 10*3/uL (ref 1.4–7.7)
Neutrophils Relative %: 72.5 % (ref 43.0–77.0)
Platelets: 257 10*3/uL (ref 150.0–400.0)
RBC: 3.94 Mil/uL (ref 3.87–5.11)
RDW: 14.7 % (ref 11.5–15.5)
WBC: 6.7 10*3/uL (ref 4.0–10.5)

## 2015-09-18 LAB — LIPID PANEL
Cholesterol: 127 mg/dL (ref 0–200)
HDL: 51.1 mg/dL (ref >39.00)
LDL Cholesterol: 63 mg/dL (ref 0–99)
NonHDL: 75.49
Total CHOL/HDL Ratio: 2
Triglycerides: 64 mg/dL (ref 0.0–149.0)
VLDL: 12.8 mg/dL (ref 0.0–40.0)

## 2015-09-18 LAB — HEMOGLOBIN A1C: Hgb A1c MFr Bld: 7.9 % — ABNORMAL HIGH (ref 4.6–6.5)

## 2015-09-18 LAB — HEPATIC FUNCTION PANEL
ALT: 35 U/L (ref 0–35)
AST: 37 U/L (ref 0–37)
Albumin: 3.9 g/dL (ref 3.5–5.2)
Alkaline Phosphatase: 84 U/L (ref 39–117)
Bilirubin, Direct: 0.2 mg/dL (ref 0.0–0.3)
Total Bilirubin: 0.9 mg/dL (ref 0.2–1.2)
Total Protein: 6.9 g/dL (ref 6.0–8.3)

## 2015-09-18 LAB — TSH: TSH: 1.59 u[IU]/mL (ref 0.35–4.50)

## 2015-09-18 LAB — FERRITIN: Ferritin: 30.7 ng/mL (ref 10.0–291.0)

## 2015-09-20 LAB — MICROALBUMIN / CREATININE URINE RATIO
Creatinine,U: 204.1 mg/dL
Microalb Creat Ratio: 1.3 mg/g (ref 0.0–30.0)
Microalb, Ur: 2.6 mg/dL — ABNORMAL HIGH (ref 0.0–1.9)

## 2015-09-24 ENCOUNTER — Ambulatory Visit: Payer: Medicare Other | Admitting: Dietician

## 2015-09-24 ENCOUNTER — Encounter: Payer: Self-pay | Admitting: Internal Medicine

## 2015-09-25 DIAGNOSIS — J449 Chronic obstructive pulmonary disease, unspecified: Secondary | ICD-10-CM | POA: Diagnosis not present

## 2015-09-25 DIAGNOSIS — R413 Other amnesia: Secondary | ICD-10-CM | POA: Diagnosis not present

## 2015-09-25 DIAGNOSIS — I10 Essential (primary) hypertension: Secondary | ICD-10-CM | POA: Diagnosis not present

## 2015-09-25 DIAGNOSIS — I69393 Ataxia following cerebral infarction: Secondary | ICD-10-CM | POA: Diagnosis not present

## 2015-09-25 DIAGNOSIS — M1991 Primary osteoarthritis, unspecified site: Secondary | ICD-10-CM | POA: Diagnosis not present

## 2015-09-25 DIAGNOSIS — I69892 Facial weakness following other cerebrovascular disease: Secondary | ICD-10-CM | POA: Diagnosis not present

## 2015-09-30 DIAGNOSIS — I69393 Ataxia following cerebral infarction: Secondary | ICD-10-CM | POA: Diagnosis not present

## 2015-09-30 DIAGNOSIS — J449 Chronic obstructive pulmonary disease, unspecified: Secondary | ICD-10-CM | POA: Diagnosis not present

## 2015-09-30 DIAGNOSIS — I10 Essential (primary) hypertension: Secondary | ICD-10-CM | POA: Diagnosis not present

## 2015-09-30 DIAGNOSIS — M1991 Primary osteoarthritis, unspecified site: Secondary | ICD-10-CM | POA: Diagnosis not present

## 2015-09-30 DIAGNOSIS — R413 Other amnesia: Secondary | ICD-10-CM | POA: Diagnosis not present

## 2015-09-30 DIAGNOSIS — I69892 Facial weakness following other cerebrovascular disease: Secondary | ICD-10-CM | POA: Diagnosis not present

## 2015-10-02 ENCOUNTER — Encounter: Payer: Self-pay | Admitting: *Deleted

## 2015-10-03 DIAGNOSIS — I69393 Ataxia following cerebral infarction: Secondary | ICD-10-CM | POA: Diagnosis not present

## 2015-10-03 DIAGNOSIS — M1991 Primary osteoarthritis, unspecified site: Secondary | ICD-10-CM | POA: Diagnosis not present

## 2015-10-03 DIAGNOSIS — I10 Essential (primary) hypertension: Secondary | ICD-10-CM | POA: Diagnosis not present

## 2015-10-03 DIAGNOSIS — R413 Other amnesia: Secondary | ICD-10-CM | POA: Diagnosis not present

## 2015-10-03 DIAGNOSIS — J449 Chronic obstructive pulmonary disease, unspecified: Secondary | ICD-10-CM | POA: Diagnosis not present

## 2015-10-03 DIAGNOSIS — I69892 Facial weakness following other cerebrovascular disease: Secondary | ICD-10-CM | POA: Diagnosis not present

## 2015-10-10 ENCOUNTER — Ambulatory Visit: Payer: Self-pay | Admitting: Gastroenterology

## 2015-10-10 DIAGNOSIS — J449 Chronic obstructive pulmonary disease, unspecified: Secondary | ICD-10-CM | POA: Insufficient documentation

## 2015-10-10 DIAGNOSIS — G43909 Migraine, unspecified, not intractable, without status migrainosus: Secondary | ICD-10-CM | POA: Insufficient documentation

## 2015-10-10 DIAGNOSIS — M1990 Unspecified osteoarthritis, unspecified site: Secondary | ICD-10-CM | POA: Insufficient documentation

## 2015-10-10 DIAGNOSIS — E785 Hyperlipidemia, unspecified: Secondary | ICD-10-CM | POA: Insufficient documentation

## 2015-10-10 DIAGNOSIS — H409 Unspecified glaucoma: Secondary | ICD-10-CM | POA: Insufficient documentation

## 2015-10-14 ENCOUNTER — Ambulatory Visit: Payer: Medicare Other | Admitting: Dietician

## 2015-10-15 ENCOUNTER — Ambulatory Visit (INDEPENDENT_AMBULATORY_CARE_PROVIDER_SITE_OTHER): Payer: Medicare Other | Admitting: Gastroenterology

## 2015-10-15 ENCOUNTER — Ambulatory Visit (INDEPENDENT_AMBULATORY_CARE_PROVIDER_SITE_OTHER): Payer: Medicare Other | Admitting: Internal Medicine

## 2015-10-15 ENCOUNTER — Encounter: Payer: Self-pay | Admitting: Gastroenterology

## 2015-10-15 ENCOUNTER — Encounter: Payer: Self-pay | Admitting: Internal Medicine

## 2015-10-15 VITALS — BP 160/84 | HR 88 | Temp 97.9°F | Ht 63.0 in | Wt 182.4 lb

## 2015-10-15 VITALS — BP 122/80 | HR 80 | Temp 98.4°F | Resp 16 | Ht 65.0 in | Wt 180.0 lb

## 2015-10-15 DIAGNOSIS — F329 Major depressive disorder, single episode, unspecified: Secondary | ICD-10-CM

## 2015-10-15 DIAGNOSIS — I10 Essential (primary) hypertension: Secondary | ICD-10-CM

## 2015-10-15 DIAGNOSIS — K219 Gastro-esophageal reflux disease without esophagitis: Secondary | ICD-10-CM

## 2015-10-15 DIAGNOSIS — F32A Depression, unspecified: Secondary | ICD-10-CM

## 2015-10-15 DIAGNOSIS — I639 Cerebral infarction, unspecified: Secondary | ICD-10-CM | POA: Diagnosis not present

## 2015-10-15 DIAGNOSIS — E039 Hypothyroidism, unspecified: Secondary | ICD-10-CM

## 2015-10-15 DIAGNOSIS — E538 Deficiency of other specified B group vitamins: Secondary | ICD-10-CM

## 2015-10-15 DIAGNOSIS — R1310 Dysphagia, unspecified: Secondary | ICD-10-CM

## 2015-10-15 DIAGNOSIS — D649 Anemia, unspecified: Secondary | ICD-10-CM

## 2015-10-15 DIAGNOSIS — R2681 Unsteadiness on feet: Secondary | ICD-10-CM

## 2015-10-15 DIAGNOSIS — E1140 Type 2 diabetes mellitus with diabetic neuropathy, unspecified: Secondary | ICD-10-CM

## 2015-10-15 MED ORDER — METFORMIN HCL 500 MG PO TABS: 500.0000 mg | ORAL_TABLET | Freq: Every day | ORAL | Status: DC

## 2015-10-15 MED ORDER — PANTOPRAZOLE SODIUM 40 MG PO TBEC: 40.0000 mg | DELAYED_RELEASE_TABLET | Freq: Every day | ORAL | Status: DC

## 2015-10-15 NOTE — Progress Notes (Signed)
Primary Care Physician: Einar Pheasant, MD  Primary Gastroenterologist:  Dr. Lucilla Lame  Chief Complaint  Patient presents with  . Dysphagia    HPI: Sandra Kaufman is a 81 y.o. female here for follow-up of heartburn. The patient also reports she has some dysphagia. The patient has not been on anything for her heartburn since her dilation back in 2015. The patient also reports that she continues to have intermittent diarrhea. There is no report of any unexplained weight loss.  Current Outpatient Prescriptions  Medication Sig Dispense Refill  . albuterol (PROVENTIL HFA;VENTOLIN HFA) 108 (90 BASE) MCG/ACT inhaler Inhale 2 puffs into the lungs every 6 (six) hours as needed for wheezing or shortness of breath. 1 Inhaler 2  . amitriptyline (ELAVIL) 10 MG tablet TAKE 2 TABLETS (20 MG TOTAL) BY MOUTH AT BEDTIME. 60 tablet 2  . aspirin 325 MG tablet Take 325 mg by mouth daily.    Marland Kitchen atorvastatin (LIPITOR) 40 MG tablet TAKE 1 TABLET (40 MG TOTAL) BY MOUTH DAILY. 30 tablet 5  . bimatoprost (LUMIGAN) 0.03 % ophthalmic solution Place 1 drop into both eyes at bedtime.    . brinzolamide (AZOPT) 1 % ophthalmic suspension Place 1 drop into both eyes 2 (two) times daily.     . cefpodoxime (VANTIN) 200 MG tablet Take 1 tablet (200 mg total) by mouth 2 (two) times daily. 14 tablet 0  . Coenzyme Q10 (CO Q10) 100 MG CAPS Take 1 capsule by mouth daily.    . cyanocobalamin 2000 MCG tablet Take 2,000 mcg by mouth daily.    Marland Kitchen dexlansoprazole (DEXILANT) 60 MG capsule Take 60 mg by mouth daily.    . fluticasone (FLONASE) 50 MCG/ACT nasal spray PLACE 2 SPRAYS INTO THE NOSE DAILY. 16 g 2  . fluticasone (FLOVENT HFA) 110 MCG/ACT inhaler Inhale 2 puffs into the lungs 2 (two) times daily. 1 Inhaler 2  . irbesartan (AVAPRO) 150 MG tablet TAKE 1 TABLET BY MOUTH EVERY DAY 90 tablet 1  . irbesartan (AVAPRO) 150 MG tablet TAKE 1 TABLET BY MOUTH EVERY DAY 90 tablet 1  . levothyroxine (SYNTHROID, LEVOTHROID) 100 MCG tablet  TAKE 1 TABLET (100 MCG TOTAL) BY MOUTH DAILY BEFORE BREAKFAST. 90 tablet 1  . levothyroxine (SYNTHROID, LEVOTHROID) 88 MCG tablet Take 88 mcg by mouth daily before breakfast.    . losartan (COZAAR) 50 MG tablet Take 150 mg by mouth daily.    . mirabegron ER (MYRBETRIQ) 25 MG TB24 tablet Take 25 mg by mouth daily.    Marland Kitchen NAMENDA 10 MG tablet TAKE 1 TABLET BY MOUTH TWICE A DAY 180 tablet 1  . psyllium (METAMUCIL) 0.52 G capsule 4 capsules bid    . ranitidine (ZANTAC) 300 MG tablet Take 1 tablet (300 mg total) by mouth daily. 30 tablet 5  . sertraline (ZOLOFT) 100 MG tablet Take 1 tablet (100 mg total) by mouth daily. 30 tablet 2  . SYNTHROID 112 MCG tablet TAKE 1 TABLET (112 MCG TOTAL) BY MOUTH DAILY. 90 tablet 1  . metFORMIN (GLUCOPHAGE) 500 MG tablet Take 1 tablet (500 mg total) by mouth daily. (Patient not taking: Reported on 10/15/2015) 30 tablet 2  . pantoprazole (PROTONIX) 40 MG tablet Take 1 tablet (40 mg total) by mouth daily. 30 tablet 3   No current facility-administered medications for this visit.    Allergies as of 10/15/2015 - Review Complete 10/15/2015  Allergen Reaction Noted  . Tramadol  11/15/2012    ROS:  General: Negative for anorexia, weight  loss, fever, chills, fatigue, weakness. ENT: Negative for hoarseness, difficulty swallowing , nasal congestion. CV: Negative for chest pain, angina, palpitations, dyspnea on exertion, peripheral edema.  Respiratory: Negative for dyspnea at rest, dyspnea on exertion, cough, sputum, wheezing.  GI: See history of present illness. GU:  Negative for dysuria, hematuria, urinary incontinence, urinary frequency, nocturnal urination.  Endo: Negative for unusual weight change.    Physical Examination:   BP 160/84 mmHg  Pulse 88  Temp(Src) 97.9 F (36.6 C) (Oral)  Ht 5\' 3"  (1.6 m)  Wt 182 lb 6.4 oz (82.736 kg)  BMI 32.32 kg/m2  General: Well-nourished, well-developed in no acute distress.  Eyes: No icterus. Conjunctivae pink. Mouth:  Oropharyngeal mucosa moist and pink , no lesions erythema or exudate. Lungs: Clear to auscultation bilaterally. Non-labored. Heart: Regular rate and rhythm, no murmurs rubs or gallops.  Abdomen: Bowel sounds are normal, nontender, nondistended, no hepatosplenomegaly or masses, no abdominal bruits or hernia , no rebound or guarding.   Extremities: No lower extremity edema. No clubbing or deformities. Neuro: Alert and oriented x 3.  Grossly intact. Skin: Warm and dry, no jaundice.   Psych: Alert and cooperative, normal mood and affect.  Labs:    Imaging Studies: No results found.  Assessment and Plan:   Sandra Kaufman is a 81 y.o. y/o female who comes here today with continued heartburn and symptoms of dysphagia. The patient had dilation of her esophagus approximately 1 year ago. The patient will be put on pantoprazole 40 mg a day and see if her dysphagia improves. If it does not improve the patient has been told to call and she will be set up for a upper endoscopy. The patient has been explained the plan and agrees with it.   Note: This dictation was prepared with Dragon dictation along with smaller phrase technology. Any transcriptional errors that result from this process are unintentional.

## 2015-10-15 NOTE — Progress Notes (Signed)
Pre-visit discussion using our clinic review tool. No additional management support is needed unless otherwise documented below in the visit note.  

## 2015-10-15 NOTE — Patient Instructions (Signed)
Take align one per day  Start metformin 500mg  - one per day.

## 2015-10-15 NOTE — Progress Notes (Signed)
Patient ID: Sandra Kaufman, female   DOB: [birth year], 81 y.o.   MRN: [id number]   Subjective:    Patient ID: Sandra Kaufman, female    DOB: 26-Sep-1932, 81 y.o.   MRN: [id number]  HPI  Patient with past history of hypertension, previous CVA, hypercholesterolemia, hypothyroidism and GERD.  She comes in today to follow up on these issues.  She is accompanied by her son.  History obtained from both of them.  She is doing better.  Feels better.  Does report some dysphagia.  Worsening recently.  Due to f/u with Dr Allen Norris this pm.  Saw neurology 06/2015.  On namenda.  Recommended PT for balance issues.  Discussed continuing to stay active.  No chest pain or tightness.  No sob.  No abdominal pain or cramping.     Past Medical History  Diagnosis Date  . Hypertension   . Hypercholesterolemia   . Hypothyroidism   . COPD (chronic obstructive pulmonary disease) ([place])   . GERD (gastroesophageal reflux disease)     with esophageal stricture requiring dilatation x 2 (12/96 and 10/99)  . Arthritis   . Glaucoma   . Peripheral neuropathy ([place])   . History of chicken pox   . Diverticulosis   . Migraines   . Colon polyps   . Urinary incontinence    Past Surgical History  Procedure Laterality Date  . Abdominal hysterectomy  1961    ovaries not removed  . Tonsillectomy    . Appendectomy    . Bladder tack  1976  . Cataract extraction  1997    bilateral  . Cholecystectomy    . Interstim implant placement  2011   Family History  Problem Relation Age of Onset  . Heart disease Father     myocardial infarction  . Arthritis/Rheumatoid Mother   . Diabetes Sister   . Epilepsy Sister   . Breast cancer Neg Hx   . Colon cancer Neg Hx    Social History   Social History  . Marital Status: Widowed    Spouse Name: N/A  . Number of Children: 2  . Years of Education: N/A   Social History Main Topics  . Smoking status: Never Smoker   . Smokeless tobacco: Never Used  . Alcohol Use: No  . Drug Use: No    . Sexual Activity: Not Asked   Other Topics Concern  . None   Social History Narrative    Outpatient Encounter Prescriptions as of 10/15/2015  Medication Sig  . albuterol (PROVENTIL HFA;VENTOLIN HFA) 108 (90 BASE) MCG/ACT inhaler Inhale 2 puffs into the lungs every 6 (six) hours as needed for wheezing or shortness of breath.  Marland Kitchen amitriptyline (ELAVIL) 10 MG tablet TAKE 2 TABLETS (20 MG TOTAL) BY MOUTH AT BEDTIME.  Marland Kitchen aspirin 325 MG tablet Take 325 mg by mouth daily.  Marland Kitchen atorvastatin (LIPITOR) 40 MG tablet TAKE 1 TABLET (40 MG TOTAL) BY MOUTH DAILY.  . bimatoprost (LUMIGAN) 0.03 % ophthalmic solution Place 1 drop into both eyes at bedtime.  . brinzolamide (AZOPT) 1 % ophthalmic suspension Place 1 drop into both eyes 2 (two) times daily.   . cefpodoxime (VANTIN) 200 MG tablet Take 1 tablet (200 mg total) by mouth 2 (two) times daily.  . Coenzyme Q10 (CO Q10) 100 MG CAPS Take 1 capsule by mouth daily.  . cyanocobalamin 2000 MCG tablet Take 2,000 mcg by mouth daily.  Marland Kitchen dexlansoprazole (DEXILANT) 60 MG capsule Take 60 mg by mouth daily.  Marland Kitchen  fluticasone (FLONASE) 50 MCG/ACT nasal spray PLACE 2 SPRAYS INTO THE NOSE DAILY.  . fluticasone (FLOVENT HFA) 110 MCG/ACT inhaler Inhale 2 puffs into the lungs 2 (two) times daily.  . irbesartan (AVAPRO) 150 MG tablet TAKE 1 TABLET BY MOUTH EVERY DAY  . irbesartan (AVAPRO) 150 MG tablet TAKE 1 TABLET BY MOUTH EVERY DAY  . levothyroxine (SYNTHROID, LEVOTHROID) 100 MCG tablet TAKE 1 TABLET (100 MCG TOTAL) BY MOUTH DAILY BEFORE BREAKFAST.  Marland Kitchen levothyroxine (SYNTHROID, LEVOTHROID) 88 MCG tablet Take 88 mcg by mouth daily before breakfast.  . losartan (COZAAR) 50 MG tablet Take 150 mg by mouth daily.  . mirabegron ER (MYRBETRIQ) 25 MG TB24 tablet Take 25 mg by mouth daily.  Marland Kitchen NAMENDA 10 MG tablet TAKE 1 TABLET BY MOUTH TWICE A DAY  . psyllium (METAMUCIL) 0.52 G capsule 4 capsules bid  . ranitidine (ZANTAC) 300 MG tablet Take 1 tablet (300 mg total) by mouth  daily.  . sertraline (ZOLOFT) 100 MG tablet Take 1 tablet (100 mg total) by mouth daily.  Marland Kitchen SYNTHROID 112 MCG tablet TAKE 1 TABLET (112 MCG TOTAL) BY MOUTH DAILY.  . metFORMIN (GLUCOPHAGE) 500 MG tablet Take 1 tablet (500 mg total) by mouth daily. (Patient not taking: Reported on 10/15/2015)   No facility-administered encounter medications on file as of 10/15/2015.    Review of Systems  Constitutional: Negative for appetite change and unexpected weight change.  HENT: Negative for congestion and sinus pressure.   Eyes: Negative for discharge and visual disturbance.  Respiratory: Negative for cough, chest tightness and shortness of breath.   Cardiovascular: Negative for chest pain, palpitations and leg swelling.  Gastrointestinal: Negative for nausea, vomiting and abdominal pain.       Does report dysphagia and some regurgitation as outlined.    Genitourinary: Negative for dysuria and difficulty urinating.  Musculoskeletal: Positive for gait problem (some unsteadines.  ). Negative for joint swelling.  Skin: Negative for color change and rash.  Neurological: Negative for dizziness, light-headedness and headaches.  Psychiatric/Behavioral: Negative for dysphoric mood and agitation.       Objective:    Physical Exam  Constitutional: She appears well-developed and well-nourished. No distress.  HENT:  Nose: Nose normal.  Mouth/Throat: Oropharynx is clear and moist.  Eyes: Conjunctivae are normal. Right eye exhibits no discharge. Left eye exhibits no discharge.  Neck: Neck supple. No thyromegaly present.  Cardiovascular: Normal rate and regular rhythm.   Pulmonary/Chest: Breath sounds normal. No respiratory distress. She has no wheezes.  Abdominal: Soft. Bowel sounds are normal. There is no tenderness.  Musculoskeletal: She exhibits no edema or tenderness.  Lymphadenopathy:    She has no cervical adenopathy.  Skin: No rash noted. No erythema.  Psychiatric: She has a normal mood and  affect. Her behavior is normal.    BP 122/80 mmHg  Pulse 80  Temp(Src) 98.4 F (36.9 C) (Oral)  Resp 16  Ht 5' 5"  (1.651 m)  Wt 180 lb (81.647 kg)  BMI 29.95 kg/m2  SpO2 97% Wt Readings from Last 3 Encounters:  10/15/15 182 lb 6.4 oz (82.736 kg)  10/15/15 180 lb (81.647 kg)  09/05/15 183 lb 6 oz (83.178 kg)     Lab Results  Component Value Date   WBC 6.7 09/18/2015   HGB 11.1* 09/18/2015   HCT 33.5* 09/18/2015   PLT 257.0 09/18/2015   GLUCOSE 213* 09/18/2015   CHOL 127 09/18/2015   TRIG 64.0 09/18/2015   HDL 51.10 09/18/2015   LDLDIRECT 183.4 08/31/2013  LDLCALC 63 09/18/2015   ALT 35 09/18/2015   AST 37 09/18/2015   NA 139 09/18/2015   K 4.0 09/18/2015   CL 105 09/18/2015   CREATININE 0.89 09/18/2015   BUN 10 09/18/2015   CO2 28 09/18/2015   TSH 1.59 09/18/2015   INR 1.1 02/05/2014   HGBA1C 7.9* 09/18/2015   MICROALBUR 2.6* 09/18/2015    Dg Forearm Right  07/05/2015  CLINICAL DATA:  Fall this morning EXAM: RIGHT FOREARM - 2 VIEW COMPARISON:  None. FINDINGS: Two views of the right forearm submitted. No acute fracture or subluxation. Soft tissue swelling noted mid dorsal forearm. IMPRESSION: No acute fracture or subluxation. Soft tissue swelling mid dorsal forearm. Electronically Signed   By: Lahoma Crocker M.D.   On: 07/05/2015 14:32   Ct Head Wo Contrast  07/05/2015  CLINICAL DATA:  Fall, head injury, laceration EXAM: CT HEAD WITHOUT CONTRAST TECHNIQUE: Contiguous axial images were obtained from the base of the skull through the vertex without contrast. COMPARISON:  02/07/2014 FINDINGS: Diffuse brain atrophy evident with chronic white matter microvascular ischemic change. Lacunar-type infarction in the basal ganglia bilaterally. No acute intracranial hemorrhage, infarction, mass lesion, midline shift, herniation, or hydrocephalus. No extra-axial fluid collection. No focal mass effect or edema. Cisterns are patent. Cerebellar atrophy as well. Mastoids and sinuses remain  clear. Skull appears intact. IMPRESSION: Stable atrophy and chronic white matter microvascular ischemic changes. No interval change or acute process by noncontrast CT. Electronically Signed   By: Jerilynn Mages.  Shick M.D.   On: 07/05/2015 14:33       Assessment & Plan:   Problem List Items Addressed This Visit    Anemia    Decreased hgb on last check.  Recheck cbc and iron studies.        Relevant Orders   CBC with Differential/Platelet   Ferritin   B12 deficiency    Check b12 level.        Relevant Orders   Vitamin B12   CVA (cerebral vascular accident) ([place]) - Primary    Followed by neurology.  On aspirin.  Stable.        Relevant Orders   Lipid panel   Hepatic function panel   Depression    On zoloft.  Appears to be doing better.  Follow.        Diabetes mellitus ([place])    Discussed low carb diet and exercise.  Discussed lifestyles referral.  They decline at this time.  Start metformin.  Offered to give a glucometer and teach how to check sugars.  pts son declines.  Feels this will make her more anxious.  Follow met b and a1c.  She has adjusted her diet.        Relevant Medications   metFORMIN (GLUCOPHAGE) 500 MG tablet   Other Relevant Orders   Hemoglobin A1c   Dysphagia    Had EGD 10/25/14 - benign appear stricture s/p dilatation, hiatal hernia, otherwise normal.  Having increased issues with swallowing.  Will regurgitate food at times.  Follow.  Due to see Dr Allen Norris this pm.        GERD (gastroesophageal reflux disease)    EGD as outlined.  On protonix.        Hypertension    Blood pressure under good control.  Continue same medication regimen.  Follow pressures.  Follow metabolic panel.        Relevant Orders   Basic metabolic panel   Hypothyroidism    On thyroid replacement.  Follow  tsh.        Unsteady gait    Followed by neurology.  PT arranged.            Einar Pheasant, MD

## 2015-10-20 ENCOUNTER — Encounter: Payer: Self-pay | Admitting: Internal Medicine

## 2015-10-20 DIAGNOSIS — D649 Anemia, unspecified: Secondary | ICD-10-CM | POA: Insufficient documentation

## 2015-10-20 NOTE — Assessment & Plan Note (Signed)
Had EGD 10/25/14 - benign appear stricture s/p dilatation, hiatal hernia, otherwise normal.  Having increased issues with swallowing.  Will regurgitate food at times.  Follow.  Due to see Dr Allen Norris this pm.

## 2015-10-20 NOTE — Assessment & Plan Note (Signed)
Followed by neurology.  On aspirin.  Stable.

## 2015-10-20 NOTE — Assessment & Plan Note (Signed)
On zoloft.  Appears to be doing better.  Follow.  

## 2015-10-20 NOTE — Assessment & Plan Note (Signed)
Followed by neurology.  PT arranged.

## 2015-10-20 NOTE — Assessment & Plan Note (Signed)
Blood pressure under good control.  Continue same medication regimen.  Follow pressures.  Follow metabolic panel.   

## 2015-10-20 NOTE — Assessment & Plan Note (Signed)
Discussed low carb diet and exercise.  Discussed lifestyles referral.  They decline at this time.  Start metformin.  Offered to give a glucometer and teach how to check sugars.  pts son declines.  Feels this will make her more anxious.  Follow met b and a1c.  She has adjusted her diet.   

## 2015-10-20 NOTE — Assessment & Plan Note (Signed)
On thyroid replacement.  Follow tsh.  

## 2015-10-20 NOTE — Assessment & Plan Note (Signed)
Decreased hgb on last check.  Recheck cbc and iron studies.

## 2015-10-20 NOTE — Assessment & Plan Note (Signed)
Check b12 level  

## 2015-10-20 NOTE — Assessment & Plan Note (Signed)
EGD as outlined.  On protonix.

## 2015-11-18 ENCOUNTER — Encounter: Payer: Self-pay | Admitting: Emergency Medicine

## 2015-11-18 ENCOUNTER — Encounter: Payer: Self-pay | Admitting: Nurse Practitioner

## 2015-11-18 ENCOUNTER — Emergency Department: Payer: Medicare Other

## 2015-11-18 ENCOUNTER — Ambulatory Visit (INDEPENDENT_AMBULATORY_CARE_PROVIDER_SITE_OTHER): Payer: Medicare Other | Admitting: Nurse Practitioner

## 2015-11-18 ENCOUNTER — Telehealth: Payer: Self-pay | Admitting: Nurse Practitioner

## 2015-11-18 ENCOUNTER — Emergency Department
Admission: EM | Admit: 2015-11-18 | Discharge: 2015-11-18 | Disposition: A | Discharge status: Home / Self Care | Payer: Medicare Other | Attending: Emergency Medicine | Admitting: Emergency Medicine

## 2015-11-18 VITALS — BP 132/80 | HR 90 | Temp 97.9°F | Wt 174.0 lb

## 2015-11-18 DIAGNOSIS — Y9389 Activity, other specified: Secondary | ICD-10-CM | POA: Insufficient documentation

## 2015-11-18 DIAGNOSIS — R3911 Hesitancy of micturition: Secondary | ICD-10-CM | POA: Insufficient documentation

## 2015-11-18 DIAGNOSIS — R300 Dysuria: Secondary | ICD-10-CM | POA: Diagnosis not present

## 2015-11-18 DIAGNOSIS — I10 Essential (primary) hypertension: Secondary | ICD-10-CM | POA: Insufficient documentation

## 2015-11-18 DIAGNOSIS — Y998 Other external cause status: Secondary | ICD-10-CM | POA: Diagnosis not present

## 2015-11-18 DIAGNOSIS — R339 Retention of urine, unspecified: Secondary | ICD-10-CM | POA: Insufficient documentation

## 2015-11-18 DIAGNOSIS — R350 Frequency of micturition: Secondary | ICD-10-CM | POA: Diagnosis not present

## 2015-11-18 DIAGNOSIS — S0990XA Unspecified injury of head, initial encounter: Secondary | ICD-10-CM | POA: Diagnosis present

## 2015-11-18 DIAGNOSIS — R296 Repeated falls: Secondary | ICD-10-CM | POA: Diagnosis not present

## 2015-11-18 DIAGNOSIS — W19XXXA Unspecified fall, initial encounter: Secondary | ICD-10-CM

## 2015-11-18 DIAGNOSIS — S0011XA Contusion of right eyelid and periocular area, initial encounter: Secondary | ICD-10-CM | POA: Diagnosis not present

## 2015-11-18 DIAGNOSIS — W1839XA Other fall on same level, initial encounter: Secondary | ICD-10-CM | POA: Insufficient documentation

## 2015-11-18 DIAGNOSIS — Y9289 Other specified places as the place of occurrence of the external cause: Secondary | ICD-10-CM | POA: Insufficient documentation

## 2015-11-18 DIAGNOSIS — I639 Cerebral infarction, unspecified: Secondary | ICD-10-CM

## 2015-11-18 DIAGNOSIS — E119 Type 2 diabetes mellitus without complications: Secondary | ICD-10-CM | POA: Diagnosis not present

## 2015-11-18 DIAGNOSIS — S0511XA Contusion of eyeball and orbital tissues, right eye, initial encounter: Secondary | ICD-10-CM | POA: Diagnosis not present

## 2015-11-18 LAB — URINALYSIS COMPLETE WITH MICROSCOPIC (ARMC ONLY)
Bacteria, UA: NONE SEEN
Bilirubin Urine: NEGATIVE
Glucose, UA: NEGATIVE mg/dL
Hgb urine dipstick: NEGATIVE
Ketones, ur: NEGATIVE mg/dL
Leukocytes, UA: NEGATIVE
Nitrite: NEGATIVE
Protein, ur: NEGATIVE mg/dL
RBC / HPF: NONE SEEN RBC/hpf (ref 0–5)
Specific Gravity, Urine: 1.006 (ref 1.005–1.030)
Squamous Epithelial / LPF: NONE SEEN
pH: 5 (ref 5.0–8.0)

## 2015-11-18 LAB — CBC WITH DIFFERENTIAL/PLATELET
Basophils Absolute: 0.1 10*3/uL (ref 0–0.1)
Basophils Relative: 1 %
Eosinophils Absolute: 0.1 10*3/uL (ref 0–0.7)
Eosinophils Relative: 1 %
HCT: 40.8 % (ref 35.0–47.0)
Hemoglobin: 13.2 g/dL (ref 12.0–16.0)
Lymphocytes Relative: 12 %
Lymphs Abs: 1.2 10*3/uL (ref 1.0–3.6)
MCH: 26.5 pg (ref 26.0–34.0)
MCHC: 32.3 g/dL (ref 32.0–36.0)
MCV: 82.2 fL (ref 80.0–100.0)
Monocytes Absolute: 0.7 10*3/uL (ref 0.2–0.9)
Monocytes Relative: 6 %
Neutro Abs: 8.3 10*3/uL — ABNORMAL HIGH (ref 1.4–6.5)
Neutrophils Relative %: 80 %
Platelets: 236 10*3/uL (ref 150–440)
RBC: 4.97 MIL/uL (ref 3.80–5.20)
RDW: 15.6 % — ABNORMAL HIGH (ref 11.5–14.5)
WBC: 10.3 10*3/uL (ref 3.6–11.0)

## 2015-11-18 LAB — COMPREHENSIVE METABOLIC PANEL
ALT: 24 U/L (ref 14–54)
AST: 29 U/L (ref 15–41)
Albumin: 4.3 g/dL (ref 3.5–5.0)
Alkaline Phosphatase: 82 U/L (ref 38–126)
Anion gap: 10 (ref 5–15)
BUN: 19 mg/dL (ref 6–20)
CO2: 24 mmol/L (ref 22–32)
Calcium: 9.2 mg/dL (ref 8.9–10.3)
Chloride: 102 mmol/L (ref 101–111)
Creatinine, Ser: 0.75 mg/dL (ref 0.44–1.00)
GFR calc Af Amer: >60 mL/min (ref >60)
GFR calc non Af Amer: >60 mL/min (ref >60)
Glucose, Bld: 167 mg/dL — ABNORMAL HIGH (ref 65–99)
Potassium: 3.2 mmol/L — ABNORMAL LOW (ref 3.5–5.1)
Sodium: 136 mmol/L (ref 135–145)
Total Bilirubin: 1.3 mg/dL — ABNORMAL HIGH (ref 0.3–1.2)
Total Protein: 7.8 g/dL (ref 6.5–8.1)

## 2015-11-18 LAB — TROPONIN I: Troponin I: <0.03 ng/mL (ref <0.031)

## 2015-11-18 MED ORDER — ACETAMINOPHEN 500 MG PO TABS
1000.0000 mg | ORAL_TABLET | Freq: Once | ORAL | Status: AC
  Administered 2015-11-18: 1000 mg via ORAL
  Filled 2015-11-18: qty 2

## 2015-11-18 NOTE — ED Notes (Addendum)
Here this AM with complaints of urinary frequency, dysuria X a week. Has fallen twice in past week.  Right eye bruised, right hand and elbow. Seen at [facility] this AM - sent to ED for evaluation.

## 2015-11-18 NOTE — Progress Notes (Signed)
Patient ID: Sandra Kaufman, female    DOB: 1932-04-06  Age: 81 y.o. MRN: [id number]  CC: Urinary Tract Infection   HPI Sandra Kaufman presents for UTI symptoms x unknown days. She is accompanied by [name] her caregiver today.  1) [facility], [facility] face  Yesterday. [place] last week in her home, denies taking ASA 325 mg, but is also confused today and her caregiver does not deal with the medications. Caregiver reports increased confusion, gait difficulties, and pt complaining of a headache this morning.   2) UTI symptoms x awhile, pressure, dysuria. Odorous, denies cloudiness or discoloration.  No treatment to date.   History [name] has a past medical history of Hypertension; Hypercholesterolemia; Hypothyroidism; COPD (chronic obstructive pulmonary disease) ([place]); GERD (gastroesophageal reflux disease); Arthritis; Glaucoma; Peripheral neuropathy ([place]); History of chicken pox; Diverticulosis; Migraines; Colon polyps; and Urinary incontinence.   She has past surgical history that includes Abdominal hysterectomy (1961); Tonsillectomy; Appendectomy; bladder tack (1976); Cataract extraction (1997); Cholecystectomy; and Interstim Implant placement (2011).   Her family history includes Arthritis/Rheumatoid in her mother; Diabetes in her sister; Epilepsy in her sister; Heart disease in her father. There is no history of Breast cancer or Colon cancer.She reports that she has never smoked. She has never used smokeless tobacco. She reports that she does not drink alcohol or use illicit drugs.  Outpatient Prescriptions Prior to Visit  Medication Sig Dispense Refill  . albuterol (PROVENTIL HFA;VENTOLIN HFA) 108 (90 BASE) MCG/ACT inhaler Inhale 2 puffs into the lungs every 6 (six) hours as needed for wheezing or shortness of breath. 1 Inhaler 2  . amitriptyline (ELAVIL) 10 MG tablet TAKE 2 TABLETS (20 MG TOTAL) BY MOUTH AT BEDTIME. 60 tablet 2  . aspirin 325 MG tablet Take 325 mg by  mouth daily.    Marland Kitchen atorvastatin (LIPITOR) 40 MG tablet TAKE 1 TABLET (40 MG TOTAL) BY MOUTH DAILY. 30 tablet 5  . bimatoprost (LUMIGAN) 0.03 % ophthalmic solution Place 1 drop into both eyes at bedtime.    . brinzolamide (AZOPT) 1 % ophthalmic suspension Place 1 drop into both eyes 2 (two) times daily.     . cefpodoxime (VANTIN) 200 MG tablet Take 1 tablet (200 mg total) by mouth 2 (two) times daily. 14 tablet 0  . Coenzyme Q10 (CO Q10) 100 MG CAPS Take 1 capsule by mouth daily.    . cyanocobalamin 2000 MCG tablet Take 2,000 mcg by mouth daily.    Marland Kitchen dexlansoprazole (DEXILANT) 60 MG capsule Take 60 mg by mouth daily.    . fluticasone (FLONASE) 50 MCG/ACT nasal spray PLACE 2 SPRAYS INTO THE NOSE DAILY. 16 g 2  . fluticasone (FLOVENT HFA) 110 MCG/ACT inhaler Inhale 2 puffs into the lungs 2 (two) times daily. 1 Inhaler 2  . irbesartan (AVAPRO) 150 MG tablet TAKE 1 TABLET BY MOUTH EVERY DAY 90 tablet 1  . irbesartan (AVAPRO) 150 MG tablet TAKE 1 TABLET BY MOUTH EVERY DAY 90 tablet 1  . levothyroxine (SYNTHROID, LEVOTHROID) 100 MCG tablet TAKE 1 TABLET (100 MCG TOTAL) BY MOUTH DAILY BEFORE BREAKFAST. 90 tablet 1  . levothyroxine (SYNTHROID, LEVOTHROID) 88 MCG tablet Take 88 mcg by mouth daily before breakfast.    . losartan (COZAAR) 50 MG tablet Take 150 mg by mouth daily.    . metFORMIN (GLUCOPHAGE) 500 MG tablet Take 1 tablet (500 mg total) by mouth daily. 30 tablet 2  . mirabegron ER (MYRBETRIQ) 25 MG TB24 tablet Take 25 mg by  mouth daily.    Marland Kitchen NAMENDA 10 MG tablet TAKE 1 TABLET BY MOUTH TWICE A DAY 180 tablet 1  . pantoprazole (PROTONIX) 40 MG tablet Take 1 tablet (40 mg total) by mouth daily. 30 tablet 3  . psyllium (METAMUCIL) 0.52 G capsule 4 capsules bid    . ranitidine (ZANTAC) 300 MG tablet Take 1 tablet (300 mg total) by mouth daily. 30 tablet 5  . sertraline (ZOLOFT) 100 MG tablet Take 1 tablet (100 mg total) by mouth daily. 30 tablet 2  . SYNTHROID 112 MCG tablet TAKE 1 TABLET (112 MCG  TOTAL) BY MOUTH DAILY. 90 tablet 1   No facility-administered medications prior to visit.    ROS Review of Systems  Constitutional: Negative for fever, chills, diaphoresis and fatigue.  Eyes: Negative for photophobia and visual disturbance.  Respiratory: Negative for chest tightness, shortness of breath and wheezing.   Cardiovascular: Negative for chest pain, palpitations and leg swelling.  Genitourinary: Positive for dysuria. Negative for urgency, frequency, hematuria, flank pain, decreased urine volume, difficulty urinating and pelvic pain.  Musculoskeletal: Positive for gait problem.       Has cane today  Skin: Positive for color change. Negative for rash.       Ecchymosis right eye  Neurological: Positive for headaches. Negative for dizziness, weakness and light-headedness.  Psychiatric/Behavioral: Positive for confusion.    Objective:  BP 132/80 mmHg  Pulse 90  Temp(Src) 97.9 F (36.6 C) (Oral)  Wt 174 lb (78.926 kg)  SpO2 97%  Physical Exam  Constitutional: She appears well-developed and well-nourished. No distress.  HENT:  Head: Normocephalic and atraumatic.  Right Ear: External ear normal.  Left Ear: External ear normal.  Eyes: Pupils are equal, round, and reactive to light.  Pulmonary/Chest: Effort normal.  Abdominal: There is no CVA tenderness.  Musculoskeletal: Normal range of motion. She exhibits edema and tenderness.  Able to make a fist and decreased grip strength on right due to pain  Neurological: She is alert. She is disoriented. No cranial nerve deficit. She exhibits normal muscle tone. Gait abnormal. Coordination normal.  Skin: Skin is warm and dry. No rash noted. She is not diaphoretic.     Ecchymosis and tenderness of right eye and right third finger onto hand, right posterior forearm also slightly ecchymotic  Psychiatric: She has a normal mood and affect. Her behavior is normal. Judgment and thought content normal.   Assessment & Plan:   Sandra Kaufman  was seen today for urinary tract infection.  Diagnoses and all orders for this visit:  Dysuria -     POCT Urinalysis Dipstick   I am having Ms. Liller maintain her brinzolamide, bimatoprost, psyllium, irbesartan, NAMENDA, fluticasone, aspirin, Co Q10, cyanocobalamin, ranitidine, fluticasone, SYNTHROID, irbesartan, atorvastatin, albuterol, sertraline, mirabegron ER, losartan, levothyroxine, dexlansoprazole, cefpodoxime, amitriptyline, levothyroxine, metFORMIN, and pantoprazole.  No orders of the defined types were placed in this encounter.     Follow-up: No Follow-up on file.

## 2015-11-18 NOTE — Telephone Encounter (Signed)
Called [facility] ED and spoke with Sharee Pimple about POV arrival (caregiver is bringing pt). Discussed symptoms and that it was a fall. Discussed ASA 325 mg possible use and need for CT of head with possible orbital views.

## 2015-11-18 NOTE — Patient Instructions (Signed)
Please proceed to [facility] for follow up. I will call ahead to let them know you are coming.

## 2015-11-18 NOTE — Discharge Instructions (Signed)
Acute Urinary Retention, Female Acute urinary retention is the temporary inability to urinate. This is an uncommon problem in women. It can be caused by:  Infection.  A side effect of a medicine.  A problem in a nearby organ that presses or squeezes on the bladder or the urethra (the tube that drains the bladder).  Psychological problems.   Surgery on your bladder, urethra, or pelvic organs that causes obstruction to the outflow of urine from your bladder. HOME CARE INSTRUCTIONS  If you are sent home with a Foley catheter and a drainage system, you will need to discuss the best course of action with your health care provider. While the catheter is in, maintain a good intake of fluids. Keep the drainage bag emptied and lower than your catheter. This is so that contaminated urine will not flow back into your bladder, which could lead to a urinary tract infection. There are two main types of drainage bags. One is a large bag that usually is used at night. It has a good capacity that will allow you to sleep through the night without having to empty it. The second type is called a leg bag. It has a smaller capacity so it needs to be emptied more frequently. However, the main advantage is that it can be attached by a leg strap and goes underneath your clothing, allowing you the freedom to move about or leave your home. Only take over-the-counter or prescription medicines for pain, discomfort, or fever as directed by your health care provider.  SEEK MEDICAL CARE IF:  You develop a low-grade fever.  You experience spasms or leakage of urine with the spasms. SEEK IMMEDIATE MEDICAL CARE IF:   You develop chills or fever.  Your catheter stops draining urine.  Your catheter falls out.  You start to develop increased bleeding that does not respond to rest and increased fluid intake. MAKE SURE YOU:  Understand these instructions.  Will watch your condition.  Will get help right away if you are  not doing well or get worse.   This information is not intended to replace advice given to you by your health care provider. Make sure you discuss any questions you have with your health care provider.   Document Released: 12/13/2006 Document Revised: 04/30/2015 Document Reviewed: 05/25/2013 Elsevier Interactive Patient Education 2016 [place] Injury, Adult You have a head injury. Headaches and throwing up (vomiting) are common after a head injury. It should be easy to wake up from sleeping. Sometimes you must stay in the hospital. Most problems happen within the first 24 hours. Side effects may occur up to 7-10 days after the injury.  WHAT ARE THE TYPES OF HEAD INJURIES? Head injuries can be as minor as a bump. Some head injuries can be more severe. More severe head injuries include:  A jarring injury to the brain (concussion).  A bruise of the brain (contusion). This mean there is bleeding in the brain that can cause swelling.  A cracked skull (skull fracture).  Bleeding in the brain that collects, clots, and forms a bump (hematoma). WHEN SHOULD I GET HELP RIGHT AWAY?   You are confused or sleepy.  You cannot be woken up.  You feel sick to your stomach (nauseous) or keep throwing up (vomiting).  Your dizziness or unsteadiness is getting worse.  You have very bad, lasting headaches that are not helped by medicine. Take medicines only as told by your doctor.  You cannot use your arms or  legs like normal.  You cannot walk.  You notice changes in the black spots in the center of the colored part of your eye (pupil).  You have clear or bloody fluid coming from your nose or ears.  You have trouble seeing. During the next 24 hours after the injury, you must stay with someone who can watch you. This person should get help right away (call 911 in the [place] if you start to shake and are not able to control it (have seizures), you pass out, or you are unable to wake  up. HOW CAN I PREVENT A HEAD INJURY IN THE FUTURE?  Wear seat belts.  Wear a helmet while bike riding and playing sports like football.  Stay away from dangerous activities around the house. WHEN CAN I RETURN TO NORMAL ACTIVITIES AND ATHLETICS? See your doctor before doing these activities. You should not do normal activities or play contact sports until 1 week after the following symptoms have stopped:  Headache that does not go away.  Dizziness.  Poor attention.  Confusion.  Memory problems.  Sickness to your stomach or throwing up.  Tiredness.  Fussiness.  Bothered by bright lights or loud noises.  Anxiousness or depression.  Restless sleep. MAKE SURE YOU:   Understand these instructions.  Will watch your condition.  Will get help right away if you are not doing well or get worse.   This information is not intended to replace advice given to you by your health care provider. Make sure you discuss any questions you have with your health care provider.   Document Released: 11/26/2008 Document Revised: 01/04/2015 Document Reviewed: 08/21/2013 Elsevier Interactive Patient Education [employer].

## 2015-11-18 NOTE — Assessment & Plan Note (Signed)
[facility]. She has had increased confusion, gait difficulties, headache, and ecchymosis today. She was advised to go to the ER today for further evaluation and treatment. Would like for her to have CT of head with orbital study.   Will follow

## 2015-11-18 NOTE — ED Notes (Signed)
Bladder scan completed with 457ml. Per MD place foley.

## 2015-11-18 NOTE — ED Provider Notes (Signed)
[facility] Emergency Department Provider Note     Time seen: ----------------------------------------- 10:43 AM on 11/18/2015 -----------------------------------------    I have reviewed the triage vital signs and the nursing notes.   HISTORY  Chief Complaint Urinary Frequency; Dysuria; and Fall    HPI Sandra Kaufman is a 81 y.o. female who presents to ER for urinary frequency and dysuria for the last week. Patient has had trouble taking urine today. She also fallen twice in last week, she fell leaving [facility] and sustained bruising around the right eye.She denies any fevers, chills, chest pain, shortness of breath, nausea vomiting or diarrhea.   Past Medical History  Diagnosis Date  . Hypertension   . Hypercholesterolemia   . Hypothyroidism   . COPD (chronic obstructive pulmonary disease) ([place])   . GERD (gastroesophageal reflux disease)     with esophageal stricture requiring dilatation x 2 (12/96 and 10/99)  . Arthritis   . Glaucoma   . Peripheral neuropathy ([place])   . History of chicken pox   . Diverticulosis   . Migraines   . Colon polyps   . Urinary incontinence     Patient Active Problem List   Diagnosis Date Noted  . Dysuria 11/18/2015  . Anemia 10/20/2015  . Headache, migraine 10/10/2015  . HLD (hyperlipidemia) 10/10/2015  . Glaucoma 10/10/2015  . Chronic obstructive pulmonary disease ([place]) 10/10/2015  . Arthritis 10/10/2015  . Encounter for staple removal 07/23/2015  . Hypertrophic toenail 07/23/2015  . Fatigue 07/07/2015  . Cough 06/16/2015  . Health care maintenance 05/27/2015  . Excessive falling 10/04/2014  . Cerebral infarction ([place]) 10/04/2014  . Difficulty in walking 10/04/2014  . Cerebral ataxia ([place]) 10/04/2014  . Unsteady gait 08/22/2014  . Obesity 08/22/2014  . Dysphagia 07/14/2014  . Amnesia 05/24/2014  . B12 deficiency 05/24/2014  . 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 SOB (shortness of breath) 05/06/2014  .  CVA (cerebral vascular accident) ([place]) 03/04/2014  . Diverticulosis 04/29/2013  . GERD (gastroesophageal reflux disease) 11/27/2012  . Urinary incontinence 11/27/2012  . Ovarian cyst 11/27/2012  . Depression 11/27/2012  . Neuropathy ([place]) 11/27/2012  . Hypothyroidism 11/15/2012  . Hypercholesterolemia 11/15/2012  . Diabetes mellitus ([place]) 11/15/2012  . Hypertension 11/15/2012    Past Surgical History  Procedure Laterality Date  . Abdominal hysterectomy  1961    ovaries not removed  . Tonsillectomy    . Appendectomy    . Bladder tack  1976  . Cataract extraction  1997    bilateral  . Cholecystectomy    . Interstim implant placement  2011    Allergies Tramadol  Social History Social History  Substance Use Topics  . Smoking status: Never Smoker   . Smokeless tobacco: Never Used  . Alcohol Use: No    Review of Systems Constitutional: Negative for fever. Eyes: Negative for visual changes. ENT: Negative for sore throat. Cardiovascular: Negative for chest pain. Respiratory: Negative for shortness of breath. Gastrointestinal: Negative for abdominal pain, vomiting and diarrhea. Genitourinary: Positive for dysuria and urinary hesitancy Musculoskeletal: Negative for back pain. Skin: Negative for rash. Neurological: Negative for headaches, positive for weakness and falls  10-point ROS otherwise negative.  ____________________________________________   PHYSICAL EXAM:  VITAL SIGNS: ED Triage Vitals  Enc Vitals Group     BP 11/18/15 1008 158/79 mmHg     Pulse Rate 11/18/15 1008 80     Resp 11/18/15 1008 20     Temp 11/18/15 1008 97.3 F (36.3 C)  Temp Source 11/18/15 1008 Oral     SpO2 11/18/15 1008 94 %     Weight 11/18/15 1008 180 lb (81.647 kg)     Height 11/18/15 1008 5\' 3"  (1.6 m)     Head Cir --      Peak Flow --      Pain Score --      Pain Loc --      Pain Edu? --      Excl. in [place]? --     Constitutional: Alert and oriented. Well appearing and in  no distress. Eyes: Conjunctivae are normal. PERRL. Normal extraocular movements. No signs of entrapment ENT   Head: Right periorbital ecchymosis with supraorbital swelling   Nose: No congestion/rhinnorhea.   Mouth/Throat: Mucous membranes are moist.   Neck: No stridor. Cardiovascular: Normal rate, regular rhythm. Normal and symmetric distal pulses are present in all extremities. No murmurs, rubs, or gallops. Respiratory: Normal respiratory effort without tachypnea nor retractions. Breath sounds are clear and equal bilaterally. No wheezes/rales/rhonchi. Gastrointestinal: Soft and nontender. No distention. No abdominal bruits.  Musculoskeletal: Nontender with normal range of motion in all extremities. No joint effusions.  No lower extremity tenderness nor edema. Neurologic:  Normal speech and language. No gross focal neurologic deficits are appreciated. Speech is normal.  Skin:  Skin is warm, dry and intact. No rash noted. Psychiatric: Mood and affect are normal. Speech and behavior are normal. Patient exhibits appropriate insight and judgment. ____________________________________________  ED COURSE:  Pertinent labs & imaging results that were available during my care of the patient were reviewed by me and considered in my medical decision making (see chart for details). Patient's no acute distress, will obtain bladder scan and basic labs.  Foley catheter placed and patient had 800 mL of urine output. ____________________________________________    LABS (pertinent positives/negatives)  Labs Reviewed  CBC WITH DIFFERENTIAL/PLATELET - Abnormal; Notable for the following:    RDW 15.6 (*)    Neutro Abs 8.3 (*)    All other components within normal limits  COMPREHENSIVE METABOLIC PANEL - Abnormal; Notable for the following:    Potassium 3.2 (*)    Glucose, Bld 167 (*)    Total Bilirubin 1.3 (*)    All other components within normal limits  URINALYSIS COMPLETEWITH  MICROSCOPIC ([facility] ONLY) - Abnormal; Notable for the following:    Color, Urine YELLOW (*)    APPearance CLEAR (*)    All other components within normal limits  TROPONIN I    RADIOLOGY Images were viewed by me  CT head  IMPRESSION: No acute intracranial abnormality. ____________________________________________  FINAL ASSESSMENT AND PLAN  Fall, head injury, urinary retention  Plan: Patient with labs and imaging as dictated above. Patient was symptomatic improvement after Foley catheter placement. Urine is clear, she'll be discharged with a leg bag and referred to urology for follow-up. CT imaging of her head is negative.   Earleen Newport, MD   Earleen Newport, MD 11/18/15 [phone]

## 2015-11-18 NOTE — Assessment & Plan Note (Signed)
Unable to give urine specimen after 1 hour and water. Will send to ER for head eval and fu prn.

## 2015-11-19 ENCOUNTER — Other Ambulatory Visit: Payer: Self-pay | Admitting: Internal Medicine

## 2015-11-28 ENCOUNTER — Ambulatory Visit (INDEPENDENT_AMBULATORY_CARE_PROVIDER_SITE_OTHER): Payer: Medicare Other | Admitting: Obstetrics and Gynecology

## 2015-11-28 ENCOUNTER — Encounter: Payer: Self-pay | Admitting: Obstetrics and Gynecology

## 2015-11-28 VITALS — BP 145/85 | HR 96 | Resp 16 | Ht 63.0 in | Wt 178.0 lb

## 2015-11-28 DIAGNOSIS — I639 Cerebral infarction, unspecified: Secondary | ICD-10-CM

## 2015-11-28 DIAGNOSIS — R339 Retention of urine, unspecified: Secondary | ICD-10-CM

## 2015-11-28 LAB — BLADDER SCAN AMB NON-IMAGING: Scan Result: 70

## 2015-11-28 NOTE — Progress Notes (Signed)
11/28/2015 8:06 AM   Sandra Kaufman 06-24-1932 [id number]  Referring provider: Einar Pheasant, MD [id number] [place], [place] [address]  Chief Complaint  Patient presents with  . Foley catheter removal  . Establish Care  . Urinary Retention    HPI: Patient is an 81yo female with past history of hypertension, previous CVA, hypercholesterolemia, hypothyroidism, GERD and recent falls presenting today for follow-up after an acute episode of urinary retention. She was seen in the emergency department on 11/18/15 and a Foley catheter was placed with an output of 800 mL's. Her UA was unremarkable at that time. She does have a history of urinary tract infections. Most recent was 4 months ago.  PMH: Past Medical History  Diagnosis Date  . Hypertension   . Hypercholesterolemia   . Hypothyroidism   . COPD (chronic obstructive pulmonary disease) ([place])   . GERD (gastroesophageal reflux disease)     with esophageal stricture requiring dilatation x 2 (12/96 and 10/99)  . Arthritis   . Glaucoma   . Peripheral neuropathy ([place])   . History of chicken pox   . Diverticulosis   . Migraines   . Colon polyps   . Urinary incontinence     Surgical History: Past Surgical History  Procedure Laterality Date  . Abdominal hysterectomy  1961    ovaries not removed  . Tonsillectomy    . Appendectomy    . Bladder tack  1976  . Cataract extraction  1997    bilateral  . Cholecystectomy    . Interstim implant placement  2011    Home Medications:    Medication List       This list is accurate as of: 11/28/15 11:59 PM.  Always use your most recent med list.               albuterol 108 (90 BASE) MCG/ACT inhaler  Commonly known as:  PROVENTIL HFA;VENTOLIN HFA  Inhale 2 puffs into the lungs every 6 (six) hours as needed for wheezing or shortness of breath.     amitriptyline 10 MG tablet  Commonly known as:  ELAVIL  TAKE 2 TABLETS (20 MG TOTAL) BY MOUTH AT BEDTIME.      aspirin 325 MG tablet  Take 325 mg by mouth daily.     atorvastatin 40 MG tablet  Commonly known as:  LIPITOR  TAKE 1 TABLET (40 MG TOTAL) BY MOUTH DAILY.     bimatoprost 0.03 % ophthalmic solution  Commonly known as:  LUMIGAN  Place 1 drop into both eyes at bedtime.     brinzolamide 1 % ophthalmic suspension  Commonly known as:  AZOPT  Place 1 drop into both eyes 2 (two) times daily.     cefpodoxime 200 MG tablet  Commonly known as:  VANTIN  Take 1 tablet (200 mg total) by mouth 2 (two) times daily.     Co Q10 100 MG Caps  Take 1 capsule by mouth daily.     cyanocobalamin 2000 MCG tablet  Take 2,000 mcg by mouth daily.     dexlansoprazole 60 MG capsule  Commonly known as:  DEXILANT  Take 60 mg by mouth daily.     fluticasone 110 MCG/ACT inhaler  Commonly known as:  FLOVENT HFA  Inhale 2 puffs into the lungs 2 (two) times daily.     fluticasone 50 MCG/ACT nasal spray  Commonly known as:  FLONASE  PLACE 2 SPRAYS INTO THE NOSE DAILY.     irbesartan 150 MG  tablet  Commonly known as:  AVAPRO  TAKE 1 TABLET BY MOUTH EVERY DAY     losartan 50 MG tablet  Commonly known as:  COZAAR  Take 150 mg by mouth daily.     METAMUCIL 0.52 G capsule  Generic drug:  psyllium  Take 0.228 g by mouth 2 (two) times daily. 4 capsules bid     metFORMIN 500 MG tablet  Commonly known as:  GLUCOPHAGE  Take 1 tablet (500 mg total) by mouth daily.     MYRBETRIQ 25 MG Tb24 tablet  Generic drug:  mirabegron ER  Take 25 mg by mouth daily.     NAMENDA 10 MG tablet  Generic drug:  memantine  TAKE 1 TABLET BY MOUTH TWICE A DAY     pantoprazole 40 MG tablet  Commonly known as:  PROTONIX  Take 1 tablet (40 mg total) by mouth daily.     ranitidine 300 MG tablet  Commonly known as:  ZANTAC  Take 1 tablet (300 mg total) by mouth daily.     sertraline 100 MG tablet  Commonly known as:  ZOLOFT  TAKE 1 TABLET (100 MG TOTAL) BY MOUTH DAILY.     SYNTHROID 112 MCG tablet  Generic drug:   levothyroxine  TAKE 1 TABLET (112 MCG TOTAL) BY MOUTH DAILY.     levothyroxine 100 MCG tablet  Commonly known as:  SYNTHROID, LEVOTHROID  TAKE 1 TABLET (100 MCG TOTAL) BY MOUTH DAILY BEFORE BREAKFAST.     triamcinolone cream 0.1 %  Commonly known as:  KENALOG  APPLY TO AFFECTED AREA ON BODY AS NEEDED FOR ITCH        Allergies:  Allergies  Allergen Reactions  . Tramadol     Family History: Family History  Problem Relation Age of Onset  . Heart disease Father     myocardial infarction  . Arthritis/Rheumatoid Mother   . Diabetes Sister   . Epilepsy Sister   . Breast cancer Neg Hx   . Colon cancer Neg Hx     Social History:  reports that she has never smoked. She has never used smokeless tobacco. She reports that she does not drink alcohol or use illicit drugs.  ROS: UROLOGY Frequent Urination?: No Hard to postpone urination?: No Burning/pain with urination?: No Get up at night to urinate?: No Leakage of urine?: No Urine stream starts and stops?: No Trouble starting stream?: No Do you have to strain to urinate?: No Blood in urine?: No Urinary tract infection?: No Sexually transmitted disease?: No Injury to kidneys or bladder?: No Painful intercourse?: No Weak stream?: No Currently pregnant?: No Vaginal bleeding?: No Last menstrual period?: n  Gastrointestinal Nausea?: No Vomiting?: No Indigestion/heartburn?: No Diarrhea?: No Constipation?: No  Constitutional Fever: No Night sweats?: No Weight loss?: No Fatigue?: No  Skin Skin rash/lesions?: No Itching?: No  Eyes Blurred vision?: No Double vision?: No  Ears/Nose/Throat Sore throat?: No Sinus problems?: No  Hematologic/Lymphatic Swollen glands?: No Easy bruising?: No  Cardiovascular Leg swelling?: No Chest pain?: No  Respiratory Cough?: No Shortness of breath?: No  Endocrine Excessive thirst?: No  Musculoskeletal Back pain?: No Joint pain?: No  Neurological Headaches?:  No Dizziness?: No  Psychologic Depression?: No Anxiety?: No  Physical Exam: BP 145/85 mmHg  Pulse 96  Resp 16  Ht 5\' 3"  (1.6 m)  Wt 178 lb (80.74 kg)  BMI 31.54 kg/m2  Constitutional:  Alert and oriented, No acute distress. HEENT: [place] AT, moist mucus membranes.  Trachea midline, no  masses. Cardiovascular: No clubbing, cyanosis, or edema. Respiratory: Normal respiratory effort, no increased work of breathing. GI: Abdomen is soft, nontender, nondistended, no abdominal masses GU: No CVA tenderness.  Pelvic Exam: Severe atrophy, fused labia minora with narrow introitus, unable to perform internal exam Skin: No rashes, bruises or suspicious lesions. Lymph: No cervical or inguinal adenopathy. Neurologic: Grossly intact, no focal deficits, moving all 4 extremities. Psychiatric: Normal mood and affect.  Laboratory Data:   Urinalysis Results for orders placed or performed in visit on 11/28/15  BLADDER SCAN AMB NON-IMAGING  Result Value Ref Range   Scan Result 70 mL     Pertinent Imaging:   Assessment & Plan:   1. Urinary Retention- Cath removed. Patient will return at 3 today for PVR. UTI prevention strategies discussed.  Good perineal hygiene reviewed. Patient is encouraged to increase daily water intake, start cranberry supplements to prevent invasive colonization along the urinary tract and probiotics, especially lactobacillus to restore normal vaginal flora.  Patient returned and reported no difficulty voiding.  PVR 37mL. Patient to RTC in 2 months for recheck and PVR.  2. Vaginal Atrophy- Samples of Estrace cream provided today with instructions to use blueberry sized amount and massage into tissues at vaginal opening nightly for two weeks and then Monday-Wednesday-Friday nights.  There are no diagnoses linked to this encounter.  Return in about 2 months (around 01/29/2016), or if symptoms worsen or fail to improve, for patient to return at 3 today for PVR and possible cath  instructions.  These notes generated with voice recognition software. I apologize for typographical errors.  Herbert Moors, [facility] [address] [place], [place] [address] [phone]

## 2015-11-28 NOTE — Patient Instructions (Addendum)
    Estrace Cream- use blueberry sized amount and massage into tissues at vaginal opening nightly for two weeks and then Monday-Wednesday-Friday nights  Take cranberry supplements twice daily                                                Urinary Tract Infection Prevention Patient Education Stay Hydrated: Urinary tract infections (UTIs) are less likely to occur in someone who is drinking enough water to promote regular urination, so it is very important to stay hydrated in order to help flush out bacteria from the urinary tract. Respond to "Nature's Call": It is always a good idea to urinate as soon as you feel the need. While "holding it in" does not directly cause an infection, it can cause overdistension that can damage the lining of the bladder, making it more vulnerable to bacteria. Remove Tampons Before Going: Remember to always take out tampons before urinating, and change tampons often.  Practice Proper Bathroom Hygiene: To keep bacteria near the urethral opening to a minimum, it is important to practice proper wiping techniques (i.e. front to back wiping) to help prevent rectal bacteria from entering the uretro-genital area. It can also be helpful to take showers and avoid soaking in the bathtub.  Take a Vitamin C Supplement: About 1,000 milligrams of vitamin C taken daily can help inhibit the growth of some bacteria by acidifying the urine. Maintain Control with Cranberries: Cranberries contain hippuronic acid, which is a natural antiseptic that may help prevent the adherence of bacteria to the bladder lining. Drinking 100% pure cranberry juice or taking over the counter cranberry supplements twice daily may help to prevent an infection. However, it is important to note that cranberry juices/supplements are not helpful once a urinary tract infection (UTI) is present. Strengthen Your Core: Often, a lazy bladder (unable to empty urine properly) occurs due to lower back problem, so consider  doing exercises to help strengthen your back, pelvic floor, and stomach muscles.  Pay Attention to Your Urine: Your urine can change color for a variety of reasons, including from the medications you take, so pay close attention to it to monitor your overall health. One key thing to note is that if your urine is typically a darker yellow, your body is dehydrated, so you need to step up your water intake.

## 2015-12-07 ENCOUNTER — Emergency Department
Admission: EM | Admit: 2015-12-07 | Discharge: 2015-12-07 | Disposition: A | Discharge status: Home / Self Care | Payer: Medicare Other | Attending: Emergency Medicine | Admitting: Emergency Medicine

## 2015-12-07 ENCOUNTER — Emergency Department: Payer: Medicare Other

## 2015-12-07 DIAGNOSIS — M79672 Pain in left foot: Secondary | ICD-10-CM | POA: Diagnosis not present

## 2015-12-07 DIAGNOSIS — S92352A Displaced fracture of fifth metatarsal bone, left foot, initial encounter for closed fracture: Secondary | ICD-10-CM | POA: Diagnosis not present

## 2015-12-07 DIAGNOSIS — Z7984 Long term (current) use of oral hypoglycemic drugs: Secondary | ICD-10-CM | POA: Diagnosis not present

## 2015-12-07 DIAGNOSIS — I10 Essential (primary) hypertension: Secondary | ICD-10-CM | POA: Diagnosis not present

## 2015-12-07 DIAGNOSIS — W01198A Fall on same level from slipping, tripping and stumbling with subsequent striking against other object, initial encounter: Secondary | ICD-10-CM | POA: Insufficient documentation

## 2015-12-07 DIAGNOSIS — Y9301 Activity, walking, marching and hiking: Secondary | ICD-10-CM | POA: Diagnosis not present

## 2015-12-07 DIAGNOSIS — S99922A Unspecified injury of left foot, initial encounter: Secondary | ICD-10-CM | POA: Diagnosis present

## 2015-12-07 DIAGNOSIS — M25472 Effusion, left ankle: Secondary | ICD-10-CM | POA: Diagnosis not present

## 2015-12-07 DIAGNOSIS — Z7982 Long term (current) use of aspirin: Secondary | ICD-10-CM | POA: Diagnosis not present

## 2015-12-07 DIAGNOSIS — E119 Type 2 diabetes mellitus without complications: Secondary | ICD-10-CM | POA: Insufficient documentation

## 2015-12-07 DIAGNOSIS — Y92009 Unspecified place in unspecified non-institutional (private) residence as the place of occurrence of the external cause: Secondary | ICD-10-CM | POA: Diagnosis not present

## 2015-12-07 DIAGNOSIS — Y998 Other external cause status: Secondary | ICD-10-CM | POA: Insufficient documentation

## 2015-12-07 DIAGNOSIS — Z7951 Long term (current) use of inhaled steroids: Secondary | ICD-10-CM | POA: Diagnosis not present

## 2015-12-07 DIAGNOSIS — W19XXXA Unspecified fall, initial encounter: Secondary | ICD-10-CM | POA: Diagnosis not present

## 2015-12-07 DIAGNOSIS — S92302A Fracture of unspecified metatarsal bone(s), left foot, initial encounter for closed fracture: Secondary | ICD-10-CM

## 2015-12-07 DIAGNOSIS — M25471 Effusion, right ankle: Secondary | ICD-10-CM | POA: Diagnosis not present

## 2015-12-07 DIAGNOSIS — S9032XA Contusion of left foot, initial encounter: Secondary | ICD-10-CM | POA: Diagnosis not present

## 2015-12-07 DIAGNOSIS — Z79899 Other long term (current) drug therapy: Secondary | ICD-10-CM | POA: Insufficient documentation

## 2015-12-07 MED ORDER — ACETAMINOPHEN 500 MG PO TABS
1000.0000 mg | ORAL_TABLET | Freq: Once | ORAL | Status: AC
  Administered 2015-12-07: 1000 mg via ORAL

## 2015-12-07 MED ORDER — ACETAMINOPHEN 500 MG PO TABS
ORAL_TABLET | ORAL | Status: AC
  Administered 2015-12-07: 1000 mg via ORAL
  Filled 2015-12-07: qty 2

## 2015-12-07 NOTE — ED Provider Notes (Signed)
[facility] Emergency Department Provider Note REMINDER - THIS NOTE IS NOT A FINAL MEDICAL RECORD UNTIL IT IS SIGNED. UNTIL THEN, THE CONTENT BELOW MAY REFLECT INFORMATION FROM A DOCUMENTATION TEMPLATE, NOT THE ACTUAL PATIENT VISIT. ____________________________________________  Time seen: Approximately 11:18 AM  I have reviewed the triage vital signs and the nursing notes.   HISTORY  Chief Complaint Foot Pain and Hypertension    HPI Sandra Kaufman is a 81 y.o. female previous history of hypertension, COPD, acid reflux. Frequent falls.  Patient reports that yesterday while walking through her home using her walker she tripped, she fell forward and struck her left foot. She is been able to walk on it, but reports tenderness and bruising over the left foot for the last approximately one day. No numbness tingling or weakness in the leg. She did not hit her head or injure her neck. There was no loss of consciousness. She reports that she falls up to 2-3 times a day, she occasionally does use a wheelchair and that she has 24-hour assistance at home. This is not uncommon for her to fall.  No numbness tingling trouble speaking. No headache. No chest pain or trouble breathing. Her blood pressure was thought to be elevated at home, but she denies any concerns. Denies pain in the hips.   Past Medical History  Diagnosis Date  . Hypertension   . Hypercholesterolemia   . Hypothyroidism   . COPD (chronic obstructive pulmonary disease) ([place])   . GERD (gastroesophageal reflux disease)     with esophageal stricture requiring dilatation x 2 (12/96 and 10/99)  . Arthritis   . Glaucoma   . Peripheral neuropathy ([place])   . History of chicken pox   . Diverticulosis   . Migraines   . Colon polyps   . Urinary incontinence     Patient Active Problem List   Diagnosis Date Noted  . Dysuria 11/18/2015  . Anemia 10/20/2015  . Headache, migraine 10/10/2015  . HLD  (hyperlipidemia) 10/10/2015  . Glaucoma 10/10/2015  . Chronic obstructive pulmonary disease ([place]) 10/10/2015  . Arthritis 10/10/2015  . Encounter for staple removal 07/23/2015  . Hypertrophic toenail 07/23/2015  . Fatigue 07/07/2015  . Cough 06/16/2015  . Health care maintenance 05/27/2015  . Excessive falling 10/04/2014  . Cerebral infarction ([place]) 10/04/2014  . Difficulty in walking 10/04/2014  . Cerebral ataxia ([place]) 10/04/2014  . Unsteady gait 08/22/2014  . Obesity 08/22/2014  . Dysphagia 07/14/2014  . Amnesia 05/24/2014  . B12 deficiency 05/24/2014  . 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 SOB (shortness of breath) 05/06/2014  . CVA (cerebral vascular accident) ([place]) 03/04/2014  . Diverticulosis 04/29/2013  . GERD (gastroesophageal reflux disease) 11/27/2012  . Urinary incontinence 11/27/2012  . Ovarian cyst 11/27/2012  . Depression 11/27/2012  . Neuropathy ([place]) 11/27/2012  . Hypothyroidism 11/15/2012  . Hypercholesterolemia 11/15/2012  . Diabetes mellitus ([place]) 11/15/2012  . Hypertension 11/15/2012    Past Surgical History  Procedure Laterality Date  . Abdominal hysterectomy  1961    ovaries not removed  . Tonsillectomy    . Appendectomy    . Bladder tack  1976  . Cataract extraction  1997    bilateral  . Cholecystectomy    . Interstim implant placement  2011    Current Outpatient Rx  Name  Route  Sig  Dispense  Refill  . albuterol (PROVENTIL HFA;VENTOLIN HFA) 108 (90 BASE) MCG/ACT inhaler   Inhalation   Inhale 2 puffs into the lungs every 6 (  six) hours as needed for wheezing or shortness of breath.   1 Inhaler   2   . amitriptyline (ELAVIL) 10 MG tablet      TAKE 2 TABLETS (20 MG TOTAL) BY MOUTH AT BEDTIME.   60 tablet   2   . aspirin 325 MG tablet   Oral   Take 325 mg by mouth daily.         Marland Kitchen atorvastatin (LIPITOR) 40 MG tablet      TAKE 1 TABLET (40 MG TOTAL) BY MOUTH DAILY.   30 tablet   5   . bimatoprost (LUMIGAN) 0.03 % ophthalmic  solution   Both Eyes   Place 1 drop into both eyes at bedtime.         . brinzolamide (AZOPT) 1 % ophthalmic suspension   Both Eyes   Place 1 drop into both eyes 2 (two) times daily.          . cefpodoxime (VANTIN) 200 MG tablet   Oral   Take 1 tablet (200 mg total) by mouth 2 (two) times daily.   14 tablet   0   . Coenzyme Q10 (CO Q10) 100 MG CAPS   Oral   Take 1 capsule by mouth daily.         . cyanocobalamin 2000 MCG tablet   Oral   Take 2,000 mcg by mouth daily.         Marland Kitchen dexlansoprazole (DEXILANT) 60 MG capsule   Oral   Take 60 mg by mouth daily.         . fluticasone (FLONASE) 50 MCG/ACT nasal spray      PLACE 2 SPRAYS INTO THE NOSE DAILY.   16 g   2   . fluticasone (FLOVENT HFA) 110 MCG/ACT inhaler   Inhalation   Inhale 2 puffs into the lungs 2 (two) times daily.   1 Inhaler   2   . irbesartan (AVAPRO) 150 MG tablet      TAKE 1 TABLET BY MOUTH EVERY DAY   90 tablet   1   . levothyroxine (SYNTHROID, LEVOTHROID) 100 MCG tablet      TAKE 1 TABLET (100 MCG TOTAL) BY MOUTH DAILY BEFORE BREAKFAST.   90 tablet   1   . losartan (COZAAR) 50 MG tablet   Oral   Take 150 mg by mouth daily.         . metFORMIN (GLUCOPHAGE) 500 MG tablet   Oral   Take 1 tablet (500 mg total) by mouth daily.   30 tablet   2   . mirabegron ER (MYRBETRIQ) 25 MG TB24 tablet   Oral   Take 25 mg by mouth daily.         Marland Kitchen NAMENDA 10 MG tablet      TAKE 1 TABLET BY MOUTH TWICE A DAY   180 tablet   1   . pantoprazole (PROTONIX) 40 MG tablet   Oral   Take 1 tablet (40 mg total) by mouth daily.   30 tablet   3   . psyllium (METAMUCIL) 0.52 G capsule   Oral   Take 0.228 g by mouth 2 (two) times daily. 4 capsules bid         . ranitidine (ZANTAC) 300 MG tablet   Oral   Take 1 tablet (300 mg total) by mouth daily.   30 tablet   5   . sertraline (ZOLOFT) 100 MG tablet      TAKE 1  TABLET (100 MG TOTAL) BY MOUTH DAILY.   30 tablet   2   . SYNTHROID  112 MCG tablet      TAKE 1 TABLET (112 MCG TOTAL) BY MOUTH DAILY.   90 tablet   1     Dispense as written.   . triamcinolone cream (KENALOG) 0.1 %      APPLY TO AFFECTED AREA ON BODY AS NEEDED FOR ITCH      1     Allergies Tramadol  Family History  Problem Relation Age of Onset  . Heart disease Father     myocardial infarction  . Arthritis/Rheumatoid Mother   . Diabetes Sister   . Epilepsy Sister   . Breast cancer Neg Hx   . Colon cancer Neg Hx     Social History Social History  Substance Use Topics  . Smoking status: Never Smoker   . Smokeless tobacco: Never Used  . Alcohol Use: No    Review of Systems Constitutional: No fever/chills Eyes: No visual changes. ENT: No sore throat. Cardiovascular: Denies chest pain. Respiratory: Denies shortness of breath. Gastrointestinal: No abdominal pain.  No nausea, no vomiting.  No diarrhea.  No constipation. Genitourinary: Negative for dysuria. No unusual odor. No burning. Musculoskeletal: Negative for back pain for chronic ache in her lower back without change. Skin: Negative for rash. Neurological: Negative for headaches, focal weakness or numbness.  10-point ROS otherwise negative.  ____________________________________________   PHYSICAL EXAM:  VITAL SIGNS: ED Triage Vitals  Enc Vitals Group     BP 12/07/15 1102 156/88 mmHg     Pulse Rate 12/07/15 1102 74     Resp 12/07/15 1102 13     Temp 12/07/15 1102 98.3 F (36.8 C)     Temp Source 12/07/15 1102 Oral     SpO2 12/07/15 1102 93 %     Weight 12/07/15 1102 178 lb (80.74 kg)     Height 12/07/15 1102 5\' 2"  (1.575 m)     Head Cir --      Peak Flow --      Pain Score --      Pain Loc --      Pain Edu? --      Excl. in [place]? --    Constitutional: Alert and oriented. Well appearing and in no acute distress. Eyes: Conjunctivae are normal. PERRL. EOMI. Head: Atraumatic. Nose: No congestion/rhinnorhea. Mouth/Throat: Mucous membranes are moist.  Oropharynx  non-erythematous. Neck: No stridor.  No cervical spine tenderness Cardiovascular: Normal rate, regular rhythm. Grossly normal heart sounds.  Good peripheral circulation. Respiratory: Normal respiratory effort.  No retractions. Lungs CTAB. Gastrointestinal: Soft and nontender. No distention. No abdominal bruits. No CVA tenderness. Musculoskeletal: No lower extremity tenderness nor edema.  Bilateral mild ankle effusions which the patient reports are chronic. No erythema or tenderness.  Lower Extremities  No edema. Normal DP/PT pulses bilateral with good cap refill.  Normal neuro-motor function lower extremities bilateral.  RIGHT Right lower extremity demonstrates normal strength, good use of all muscles. No edema bruising or contusions of the right hip, right knee, right ankle. Full range of motion of the right lower extremity without pain. No pain on axial loading. No evidence of trauma.  LEFT Left lower extremity demonstrates normal strength, good use of all muscles. No edema bruising or contusions of the hip,  knee, ankle. Full range of motion of the left lower extremity without pain. No pain on axial loading. No evidence of trauma.  There is some slight bruising  and focal tenderness over the second metatarsal on the volar surface. No deformity. Plantar surface the foot is normal. Normal use of the toes. Normal capillary refill.   Neurologic:  Normal speech and language. No gross focal neurologic deficits are appreciated. Skin:  Skin is warm, dry and intact. No rash noted. Psychiatric: Mood and affect are normal. Speech and behavior are normal.  ____________________________________________   LABS (all labs ordered are listed, but only abnormal results are displayed)  Labs Reviewed - No data to display ____________________________________________  EKG  Reviewed and interpreted by me ED ECG REPORT I, QUALE, MARK, the attending physician, personally viewed and interpreted this  ECG.  Date: 12/07/2015 EKG Time: 11 Rate: 75 Rhythm: normal sinus rhythm QRS Axis: normal Intervals: normal ST/T Wave abnormalities: normal Conduction Disutrbances: none Narrative Interpretation: unremarkable, no ischemic abnormalities  ____________________________________________  RADIOLOGY     DG Foot Complete Left (Final result) Result time: 12/07/15 12:17:02   Final result by Rad Results In Interface (12/07/15 12:17:02)   Narrative:   CLINICAL DATA: 81 year old who fell yesterday and injured the left foot. Initial encounter.  EXAM: LEFT FOOT - COMPLETE 3+ VIEW  COMPARISON: None.  FINDINGS: Comminuted oblique fracture involving the distal shaft of the 5th metatarsal. No other fractures. Generalized osseous demineralization. Narrowing of the IP joint spaces of the 2nd through 5th toes. Mild joint space narrowing involving the 1st MTP joint without erosion and mild hallux valgus. Remaining joint spaces relatively well preserved.  IMPRESSION: 1. Acute traumatic comminuted oblique fracture involving the distal shaft of the 5th metatarsal. 2. Osteoarthritis involving the IP joints of the toes and the 1st MTP joint.   Electronically Signed By: Evangeline Dakin M.D. On: 12/07/2015 12:17    ____________________________________________   PROCEDURES  Procedure(s) performed: None  Critical Care performed: No  ____________________________________________   INITIAL IMPRESSION / ASSESSMENT AND PLAN / ED COURSE  Pertinent labs & imaging results that were available during my care of the patient were reviewed by me and considered in my medical decision making (see chart for details).  Patient presents for evaluation of left foot pain after a mechanical fall. She denies any preceding symptoms or symptoms concerning for acute neurologic, cardiac or pulmonary condition. Denies recent illness. She is awake and alert with stable vital signs. We'll obtain x-ray  of the left foot. She does report a frequent history of falls, and has assistance at home including use of walker and wheelchair. No evidence of head or neck injury. Exam of the back, chest, abdomen pelvis is normal. No evidence of hip injury or other extremity injury except for isolated bruising over the left foot with tenderness.  Paged Dr. Sabra Heck at approximately 12:45 PM, no return page at this time. We'll place the patient in a postoperative shoe and advised close follow-up with Dr. Sabra Heck this week. No evidence of complication at this time, she has assistance and a wheelchair at home so this should not be an issue for her to be nonweightbearing on the left lower extremity. Patient's son present, discussed follow-up care, return precautions, and treatment recommendations with both patient and her family at bedside. ____________________________________________   FINAL CLINICAL IMPRESSION(S) / ED DIAGNOSES  Final diagnoses:  Fracture of 5th metatarsal, left, closed, initial encounter      Delman Kitten, MD 12/07/15 1319

## 2015-12-07 NOTE — Discharge Instructions (Signed)
Metatarsal Fracture  Please follow up closely with orthopedics, Dr. Earnestine Leys this week. Please continue to use her wheelchair until you have had close follow-up with Dr. Sabra Heck for reevaluation.  A metatarsal fracture is a break in a metatarsal bone. Metatarsal bones connect your toe bones to your ankle bones. CAUSES This type of fracture may be caused by:  A sudden twisting of your foot.  A fall onto your foot.  Overuse or repetitive exercise. RISK FACTORS This condition is more likely to develop in people who:  Play contact sports.  Have a bone disease.  Have a low calcium level. SYMPTOMS Symptoms of this condition include:  Pain that is worse when walking or standing.  Pain when pressing on the foot or moving the toes.  Swelling.  Bruising on the top or bottom of the foot.  A foot that appears shorter than the other one. DIAGNOSIS This condition is diagnosed with a physical exam. You may also have imaging tests, such as:  X-rays.  A CT scan.  MRI. TREATMENT Treatment for this condition depends on its severity and whether a bone has moved out of place. Treatment may involve:  Rest.  Wearing foot support such as a cast, splint, or boot for several weeks.  Using crutches.  Surgery to move bones back into the right position. Surgery is usually needed if there are many pieces of broken bone or bones that are very out of place (displaced fracture).  Physical therapy. This may be needed to help you regain full movement and strength in your foot. You will need to return to your health care provider to have X-rays taken until your bones heal. Your health care provider will look at the X-rays to make sure that your foot is healing well. HOME CARE INSTRUCTIONS  If You Have a Cast:  Do not stick anything inside the cast to scratch your skin. Doing that increases your risk of infection.  Check the skin around the cast every day. Report any concerns to your health  care provider. You may put lotion on dry skin around the edges of the cast. Do not apply lotion to the skin underneath the cast.  Keep the cast clean and dry. If You Have a Splint or a Supportive Boot:  Wear it as directed by your health care provider. Remove it only as directed by your health care provider.  Loosen it if your toes become numb and tingle, or if they turn cold and blue.  Keep it clean and dry. Bathing  Do not take baths, swim, or use a hot tub until your health care provider approves. Ask your health care provider if you can take showers. You may only be allowed to take sponge baths for bathing.  If your health care provider approves bathing and showering, cover the cast or splint with a watertight plastic bag to protect it from water. Do not let the cast or splint get wet. Managing Pain, Stiffness, and Swelling  If directed, apply ice to the injured area (if you have a splint, not a cast).  Put ice in a plastic bag.  Place a towel between your skin and the bag.  Leave the ice on for 20 minutes, 2-3 times per day.  Move your toes often to avoid stiffness and to lessen swelling.  Raise (elevate) the injured area above the level of your heart while you are sitting or lying down. Driving  Do not drive or operate heavy machinery while taking pain  medicine.  Do not drive while wearing foot support on a foot that you use for driving. Activity  Return to your normal activities as directed by your health care provider. Ask your health care provider what activities are safe for you.  Perform exercises as directed by your health care provider or physical therapist. Safety  Do not use the injured foot to support your body weight until your health care provider says that you can. Use crutches as directed by your health care provider. General Instructions  Do not put pressure on any part of the cast or splint until it is fully hardened. This may take several hours.  Do  not use any tobacco products, including cigarettes, chewing tobacco, or e-cigarettes. Tobacco can delay bone healing. If you need help quitting, ask your health care provider.  Take medicines only as directed by your health care provider.  Keep all follow-up visits as directed by your health care provider. This is important. SEEK MEDICAL CARE IF:  You have a fever.  Your cast, splint, or boot is too loose or too tight.  Your cast, splint, or boot is damaged.  Your pain medicine is not helping.  You have pain, tingling, or numbness in your foot that is not going away. SEEK IMMEDIATE MEDICAL CARE IF:  You have severe pain.  You have tingling or numbness in your foot that is getting worse.  Your foot feels cold or becomes numb.  Your foot changes color.   This information is not intended to replace advice given to you by your health care provider. Make sure you discuss any questions you have with your health care provider.   Document Released: 09/05/2002 Document Revised: 04/30/2015 Document Reviewed: 10/10/2014 Elsevier Interactive Patient Education [employer].

## 2015-12-07 NOTE — ED Notes (Signed)
Pt arrives via [facility] from home c/o left foot pain after fall sustained yesterday. Pt also found to be hypertensive per EMS, 210/130- pt has not taken blood pressure medication in a few days per EMS. Pt does not complain of pain to left foot unless weight bearing. Pt state that she falls "all the time". Denies head injury. Pt alert and oriented X4, active, cooperative, pt in NAD. RR even and unlabored, color WNL.

## 2015-12-07 NOTE — ED Notes (Signed)
MD at bedside. 

## 2015-12-07 NOTE — ED Notes (Signed)
Pt alert and oriented X4, active, cooperative, pt in NAD. RR even and unlabored, color WNL.  Pt informed to return if any life threatening symptoms occur.   

## 2015-12-09 DIAGNOSIS — S92352A Displaced fracture of fifth metatarsal bone, left foot, initial encounter for closed fracture: Secondary | ICD-10-CM | POA: Diagnosis not present

## 2015-12-18 ENCOUNTER — Other Ambulatory Visit: Payer: Medicare Other

## 2015-12-20 ENCOUNTER — Ambulatory Visit: Payer: Medicare Other | Admitting: Internal Medicine

## 2015-12-23 ENCOUNTER — Encounter: Payer: Self-pay | Admitting: Internal Medicine

## 2015-12-24 ENCOUNTER — Encounter: Payer: Self-pay | Admitting: Nurse Practitioner

## 2015-12-24 ENCOUNTER — Other Ambulatory Visit: Payer: Self-pay | Admitting: Internal Medicine

## 2015-12-24 ENCOUNTER — Ambulatory Visit (INDEPENDENT_AMBULATORY_CARE_PROVIDER_SITE_OTHER): Payer: Medicare Other | Admitting: Nurse Practitioner

## 2015-12-24 VITALS — BP 136/80 | HR 83 | Temp 98.9°F | Resp 12 | Ht 63.0 in | Wt 179.0 lb

## 2015-12-24 DIAGNOSIS — R410 Disorientation, unspecified: Secondary | ICD-10-CM

## 2015-12-24 DIAGNOSIS — E876 Hypokalemia: Secondary | ICD-10-CM | POA: Diagnosis not present

## 2015-12-24 DIAGNOSIS — R300 Dysuria: Secondary | ICD-10-CM

## 2015-12-24 DIAGNOSIS — I639 Cerebral infarction, unspecified: Secondary | ICD-10-CM

## 2015-12-24 DIAGNOSIS — F05 Delirium due to known physiological condition: Secondary | ICD-10-CM

## 2015-12-24 NOTE — Progress Notes (Signed)
Patient ID: Sandra Kaufman, female    DOB: [birth year]  Age: 81 y.o. MRN: [id number]  CC: Urinary Tract Infection   HPI Sandra Kaufman presents for CC of confusion. She is accompanied by her son Sandra Kaufman today.  1) Per son: Christmas day- a lot of people in the house. He reports that she was confused and "not acting right". He reports she called him the other son's name.   Per patient: "Too much going on", "Felt she was going to scream"  Packed clothes like she was leaving, but then did nothing.   Denies any urinary symptoms   History Sandra Kaufman has a past medical history of Hypertension; Hypercholesterolemia; Hypothyroidism; COPD (chronic obstructive pulmonary disease) ([place]); GERD (gastroesophageal reflux disease); Arthritis; Glaucoma; Peripheral neuropathy ([place]); History of chicken pox; Diverticulosis; Migraines; Colon polyps; and Urinary incontinence.   She has past surgical history that includes Abdominal hysterectomy (1961); Tonsillectomy; Appendectomy; bladder tack (1976); Cataract extraction (1997); Cholecystectomy; and Interstim Implant placement (2011).   Her family history includes Arthritis/Rheumatoid in her mother; Diabetes in her sister; Epilepsy in her sister; Heart disease in her father. There is no history of Breast cancer or Colon cancer.She reports that she has never smoked. She has never used smokeless tobacco. She reports that she does not drink alcohol or use illicit drugs.  Outpatient Prescriptions Prior to Visit  Medication Sig Dispense Refill  . albuterol (PROVENTIL HFA;VENTOLIN HFA) 108 (90 BASE) MCG/ACT inhaler Inhale 2 puffs into the lungs every 6 (six) hours as needed for wheezing or shortness of breath. 1 Inhaler 2  . amitriptyline (ELAVIL) 10 MG tablet TAKE 2 TABLETS (20 MG TOTAL) BY MOUTH AT BEDTIME. 60 tablet 2  . aspirin 325 MG tablet Take 325 mg by mouth daily.    Marland Kitchen atorvastatin (LIPITOR) 40 MG tablet TAKE 1 TABLET (40 MG TOTAL) BY MOUTH DAILY. 30 tablet 5  .  bimatoprost (LUMIGAN) 0.03 % ophthalmic solution Place 1 drop into both eyes at bedtime.    . brinzolamide (AZOPT) 1 % ophthalmic suspension Place 1 drop into both eyes 2 (two) times daily.     . cefpodoxime (VANTIN) 200 MG tablet Take 1 tablet (200 mg total) by mouth 2 (two) times daily. 14 tablet 0  . Coenzyme Q10 (CO Q10) 100 MG CAPS Take 1 capsule by mouth daily.    . cyanocobalamin 2000 MCG tablet Take 2,000 mcg by mouth daily.    Marland Kitchen dexlansoprazole (DEXILANT) 60 MG capsule Take 60 mg by mouth daily.    . fluticasone (FLONASE) 50 MCG/ACT nasal spray PLACE 2 SPRAYS INTO THE NOSE DAILY. 16 g 2  . fluticasone (FLOVENT HFA) 110 MCG/ACT inhaler Inhale 2 puffs into the lungs 2 (two) times daily. 1 Inhaler 2  . irbesartan (AVAPRO) 150 MG tablet TAKE 1 TABLET BY MOUTH EVERY DAY 90 tablet 1  . levothyroxine (SYNTHROID, LEVOTHROID) 100 MCG tablet TAKE 1 TABLET (100 MCG TOTAL) BY MOUTH DAILY BEFORE BREAKFAST. 90 tablet 1  . losartan (COZAAR) 50 MG tablet Take 150 mg by mouth daily.    . mirabegron ER (MYRBETRIQ) 25 MG TB24 tablet Take 25 mg by mouth daily.    Marland Kitchen NAMENDA 10 MG tablet TAKE 1 TABLET BY MOUTH TWICE A DAY 180 tablet 1  . pantoprazole (PROTONIX) 40 MG tablet Take 1 tablet (40 mg total) by mouth daily. 30 tablet 3  . psyllium (METAMUCIL) 0.52 G capsule Take 0.228 g by mouth 2 (two) times daily. 4 capsules bid    . ranitidine (  ZANTAC) 300 MG tablet Take 1 tablet (300 mg total) by mouth daily. 30 tablet 5  . sertraline (ZOLOFT) 100 MG tablet TAKE 1 TABLET (100 MG TOTAL) BY MOUTH DAILY. 30 tablet 2  . SYNTHROID 112 MCG tablet TAKE 1 TABLET (112 MCG TOTAL) BY MOUTH DAILY. 90 tablet 1  . triamcinolone cream (KENALOG) 0.1 % APPLY TO AFFECTED AREA ON BODY AS NEEDED FOR ITCH  1  . metFORMIN (GLUCOPHAGE) 500 MG tablet Take 1 tablet (500 mg total) by mouth daily. 30 tablet 2   No facility-administered medications prior to visit.    ROS Review of Systems  Constitutional: Negative for fever, chills,  diaphoresis and fatigue.  Genitourinary: Negative for dysuria, urgency, frequency, hematuria, flank pain, decreased urine volume, difficulty urinating and pelvic pain.  Skin: Negative for rash.  Psychiatric/Behavioral: Positive for confusion. Negative for agitation.    Objective:  BP 136/80 mmHg  Pulse 83  Temp(Src) 98.9 F (37.2 C) (Oral)  Resp 12  Ht 5\' 3"  (1.6 m)  Wt 179 lb (81.194 kg)  BMI 31.72 kg/m2  SpO2 94%  Physical Exam  Constitutional: She is oriented to person, place, and time. She appears well-developed and well-nourished. No distress.  HENT:  Head: Normocephalic and atraumatic.  Right Ear: External ear normal.  Left Ear: External ear normal.  Abdominal: There is no CVA tenderness.  Neurological: She is alert and oriented to person, place, and time.  Patient answered year, floor of bldg, current president, and read her watch for time: all were correct   Skin: Skin is warm and dry. No rash noted. She is not diaphoretic.  Psychiatric: She has a normal mood and affect. Her behavior is normal. Judgment and thought content normal.   Assessment & Plan:   Sandra Kaufman was seen today for urinary tract infection.  Diagnoses and all orders for this visit:  Hypokalemia -     Basic Metabolic Panel (BMET)  Subacute confusional state -     POCT Urinalysis Dipstick; Future  Dysuria  I am having Sandra Kaufman maintain her brinzolamide, bimatoprost, psyllium, irbesartan, NAMENDA, fluticasone, aspirin, Co Q10, cyanocobalamin, ranitidine, fluticasone, SYNTHROID, atorvastatin, albuterol, mirabegron ER, losartan, dexlansoprazole, cefpodoxime, amitriptyline, levothyroxine, pantoprazole, sertraline, and triamcinolone cream.  No orders of the defined types were placed in this encounter.     Follow-up: Return if symptoms worsen or fail to improve.

## 2015-12-24 NOTE — Progress Notes (Signed)
Pre-visit discussion using our clinic review tool. No additional management support is needed unless otherwise documented below in the visit note.  

## 2015-12-24 NOTE — Patient Instructions (Signed)
Please bring this back tomorrow.

## 2015-12-25 ENCOUNTER — Other Ambulatory Visit: Payer: Self-pay | Admitting: Nurse Practitioner

## 2015-12-25 DIAGNOSIS — F05 Delirium due to known physiological condition: Secondary | ICD-10-CM

## 2015-12-25 DIAGNOSIS — R300 Dysuria: Secondary | ICD-10-CM | POA: Diagnosis not present

## 2015-12-25 DIAGNOSIS — R410 Disorientation, unspecified: Secondary | ICD-10-CM | POA: Insufficient documentation

## 2015-12-25 DIAGNOSIS — E876 Hypokalemia: Secondary | ICD-10-CM | POA: Diagnosis not present

## 2015-12-25 LAB — BASIC METABOLIC PANEL
BUN: 12 mg/dL (ref 6–23)
CO2: 23 mEq/L (ref 19–32)
Calcium: 9.4 mg/dL (ref 8.4–10.5)
Chloride: 103 mEq/L (ref 96–112)
Creatinine, Ser: 0.85 mg/dL (ref 0.40–1.20)
GFR: 68.12 mL/min (ref >60.00)
Glucose, Bld: 123 mg/dL — ABNORMAL HIGH (ref 70–99)
Potassium: 4.1 mEq/L (ref 3.5–5.1)
Sodium: 137 mEq/L (ref 135–145)

## 2015-12-25 LAB — POCT URINALYSIS DIPSTICK
Bilirubin, UA: NEGATIVE
Glucose, UA: NEGATIVE
Ketones, UA: NEGATIVE
Nitrite, UA: POSITIVE
Protein, UA: NEGATIVE
Spec Grav, UA: 1.02
Urobilinogen, UA: 1
pH, UA: 5.5

## 2015-12-25 MED ORDER — CEPHALEXIN 500 MG PO CAPS: 500.0000 mg | ORAL_CAPSULE | Freq: Two times a day (BID) | ORAL | Status: DC

## 2015-12-25 NOTE — Assessment & Plan Note (Signed)
Noticed by son. Will check BMET and POCT urine (when able to void). Do not feel she is at risk for urinary retention due to urinating before coming per son. Pt answered a few questions correctly and could read the face of her watch correctly. Asked them to follow up with Dr. Nicki Reaper or Neurology if continues. Pt is on Namenda

## 2015-12-25 NOTE — Addendum Note (Signed)
Addended by: Karlene Einstein D on: 12/25/2015 08:59 AM   Modules accepted: Orders

## 2015-12-25 NOTE — Assessment & Plan Note (Addendum)
No complaints of dysuria. Son is concerned about her "not being herself". Pt could not void, but had voided earlier before coming. She has been seen by Urology and the last record was personally reviewed. Gave cup to drop off urine specimen at earliest convenience. Gave instructions on how and when to collect and to bring promptly to office. [facility] office notified and gave specimen bags this morning 12/25/15.   Pt is not confused today and seems very much her usual self- on Namenda

## 2015-12-25 NOTE — Addendum Note (Signed)
Addended by: Karlene Einstein D on: 12/25/2015 08:58 AM   Modules accepted: Orders

## 2015-12-27 LAB — URINE CULTURE: Colony Count: >=100000

## 2015-12-31 DIAGNOSIS — S92352D Displaced fracture of fifth metatarsal bone, left foot, subsequent encounter for fracture with routine healing: Secondary | ICD-10-CM | POA: Diagnosis not present

## 2016-01-10 ENCOUNTER — Other Ambulatory Visit: Payer: Self-pay | Admitting: Internal Medicine

## 2016-01-15 ENCOUNTER — Other Ambulatory Visit: Payer: Medicare Other

## 2016-01-17 ENCOUNTER — Other Ambulatory Visit: Payer: Self-pay | Admitting: Internal Medicine

## 2016-01-17 ENCOUNTER — Ambulatory Visit (INDEPENDENT_AMBULATORY_CARE_PROVIDER_SITE_OTHER): Payer: Medicare Other | Admitting: Internal Medicine

## 2016-01-17 ENCOUNTER — Encounter: Payer: Self-pay | Admitting: Internal Medicine

## 2016-01-17 VITALS — BP 132/80 | HR 98 | Temp 98.3°F | Resp 18 | Ht 63.0 in | Wt 176.4 lb

## 2016-01-17 DIAGNOSIS — I639 Cerebral infarction, unspecified: Secondary | ICD-10-CM | POA: Diagnosis not present

## 2016-01-17 DIAGNOSIS — E1140 Type 2 diabetes mellitus with diabetic neuropathy, unspecified: Secondary | ICD-10-CM

## 2016-01-17 DIAGNOSIS — I10 Essential (primary) hypertension: Secondary | ICD-10-CM | POA: Diagnosis not present

## 2016-01-17 DIAGNOSIS — W19XXXA Unspecified fall, initial encounter: Secondary | ICD-10-CM

## 2016-01-17 DIAGNOSIS — F329 Major depressive disorder, single episode, unspecified: Secondary | ICD-10-CM

## 2016-01-17 DIAGNOSIS — F32A Depression, unspecified: Secondary | ICD-10-CM

## 2016-01-17 DIAGNOSIS — E7800 Pure hypercholesterolemia, unspecified: Secondary | ICD-10-CM

## 2016-01-17 DIAGNOSIS — Z1239 Encounter for other screening for malignant neoplasm of breast: Secondary | ICD-10-CM

## 2016-01-17 DIAGNOSIS — K219 Gastro-esophageal reflux disease without esophagitis: Secondary | ICD-10-CM

## 2016-01-17 DIAGNOSIS — E039 Hypothyroidism, unspecified: Secondary | ICD-10-CM

## 2016-01-17 DIAGNOSIS — R2681 Unsteadiness on feet: Secondary | ICD-10-CM

## 2016-01-17 DIAGNOSIS — G629 Polyneuropathy, unspecified: Secondary | ICD-10-CM

## 2016-01-17 DIAGNOSIS — E669 Obesity, unspecified: Secondary | ICD-10-CM

## 2016-01-17 NOTE — Progress Notes (Signed)
Pre-visit discussion using our clinic review tool. No additional management support is needed unless otherwise documented below in the visit note.  

## 2016-01-17 NOTE — Progress Notes (Signed)
Patient ID: Sandra Kaufman, female   DOB: 08-08-1932, 82 y.o.   MRN: [id number]   Subjective:    Patient ID: Sandra Kaufman, female    DOB: 1932-01-17, 82 y.o.   MRN: [id number]  HPI  Patient with past history of hypercholesterolemia, GERD, hypertension, CVA and depression.  She comes in today to follow up on these issues.  States she fell yesterday.  Slipped - in her socks.  Some initial left shoulder discomfort.  No significant pain now.  No head injury.  She does report feeling like she is unsteady with her walking.  Discussed the need to use her cane and walker.  No chest pain or tightness.  No sob.  No acid reflux reported.  No abdominal pain or cramping.  Bowels stable.  Taking zoloft.  Appears to be doing some better.  Discussed low carb diet and sugars.     Past Medical History  Diagnosis Date  . Hypertension   . Hypercholesterolemia   . Hypothyroidism   . COPD (chronic obstructive pulmonary disease) ([place])   . GERD (gastroesophageal reflux disease)     with esophageal stricture requiring dilatation x 2 (12/96 and 10/99)  . Arthritis   . Glaucoma   . Peripheral neuropathy ([place])   . History of chicken pox   . Diverticulosis   . Migraines   . Colon polyps   . Urinary incontinence    Past Surgical History  Procedure Laterality Date  . Abdominal hysterectomy  1961    ovaries not removed  . Tonsillectomy    . Appendectomy    . Bladder tack  1976  . Cataract extraction  1997    bilateral  . Cholecystectomy    . Interstim implant placement  2011   Family History  Problem Relation Age of Onset  . Heart disease Father     myocardial infarction  . Arthritis/Rheumatoid Mother   . Diabetes Sister   . Epilepsy Sister   . Breast cancer Neg Hx   . Colon cancer Neg Hx    Social History   Social History  . Marital Status: Widowed    Spouse Name: N/A  . Number of Children: 2  . Years of Education: N/A   Social History Main Topics  . Smoking status: Never Smoker   .  Smokeless tobacco: Never Used  . Alcohol Use: No  . Drug Use: No  . Sexual Activity: Not Asked   Other Topics Concern  . None   Social History Narrative    Outpatient Encounter Prescriptions as of 01/17/2016  Medication Sig  . albuterol (PROVENTIL HFA;VENTOLIN HFA) 108 (90 BASE) MCG/ACT inhaler Inhale 2 puffs into the lungs every 6 (six) hours as needed for wheezing or shortness of breath.  Marland Kitchen amitriptyline (ELAVIL) 10 MG tablet TAKE 2 TABLETS (20 MG TOTAL) BY MOUTH AT BEDTIME.  Marland Kitchen aspirin 325 MG tablet Take 325 mg by mouth daily.  . bimatoprost (LUMIGAN) 0.03 % ophthalmic solution Place 1 drop into both eyes at bedtime.  . brinzolamide (AZOPT) 1 % ophthalmic suspension Place 1 drop into both eyes 2 (two) times daily.   . Coenzyme Q10 (CO Q10) 100 MG CAPS Take 1 capsule by mouth daily.  . cyanocobalamin 2000 MCG tablet Take 2,000 mcg by mouth daily.  Marland Kitchen dexlansoprazole (DEXILANT) 60 MG capsule Take 60 mg by mouth daily.  . fluticasone (FLONASE) 50 MCG/ACT nasal spray PLACE 2 SPRAYS INTO THE NOSE DAILY.  . fluticasone (FLOVENT HFA) 110 MCG/ACT inhaler Inhale  2 puffs into the lungs 2 (two) times daily.  . irbesartan (AVAPRO) 150 MG tablet TAKE 1 TABLET BY MOUTH EVERY DAY  . levothyroxine (SYNTHROID, LEVOTHROID) 100 MCG tablet TAKE 1 TABLET (100 MCG TOTAL) BY MOUTH DAILY BEFORE BREAKFAST.  Marland Kitchen losartan (COZAAR) 50 MG tablet Take 150 mg by mouth daily.  . metFORMIN (GLUCOPHAGE) 500 MG tablet TAKE ONE TABLET BY MOUTH DAILY  . mirabegron ER (MYRBETRIQ) 25 MG TB24 tablet Take 25 mg by mouth daily.  Marland Kitchen NAMENDA 10 MG tablet TAKE 1 TABLET BY MOUTH TWICE A DAY  . pantoprazole (PROTONIX) 40 MG tablet Take 1 tablet (40 mg total) by mouth daily.  . psyllium (METAMUCIL) 0.52 G capsule Take 0.228 g by mouth 2 (two) times daily. 4 capsules bid  . ranitidine (ZANTAC) 300 MG tablet Take 1 tablet (300 mg total) by mouth daily.  . sertraline (ZOLOFT) 100 MG tablet TAKE 1 TABLET (100 MG TOTAL) BY MOUTH DAILY.  .  SYNTHROID 112 MCG tablet TAKE 1 TABLET (112 MCG TOTAL) BY MOUTH DAILY.  Marland Kitchen triamcinolone cream (KENALOG) 0.1 % APPLY TO AFFECTED AREA ON BODY AS NEEDED FOR ITCH  . [DISCONTINUED] atorvastatin (LIPITOR) 40 MG tablet TAKE 1 TABLET (40 MG TOTAL) BY MOUTH DAILY.  . [DISCONTINUED] cephALEXin (KEFLEX) 500 MG capsule Take 1 capsule (500 mg total) by mouth 2 (two) times daily.  . [DISCONTINUED] fluticasone (FLONASE) 50 MCG/ACT nasal spray PLACE 2 SPRAYS INTO THE NOSE DAILY.   No facility-administered encounter medications on file as of 01/17/2016.    Review of Systems  Constitutional: Negative for appetite change and unexpected weight change.  HENT: Negative for congestion and sinus pressure.   Eyes: Negative for discharge and redness.  Respiratory: Negative for cough, chest tightness and shortness of breath.   Cardiovascular: Negative for chest pain, palpitations and leg swelling.  Gastrointestinal: Negative for nausea, vomiting, abdominal pain and diarrhea.  Genitourinary: Negative for dysuria and difficulty urinating.  Musculoskeletal: Positive for gait problem. Negative for back pain and joint swelling.  Skin: Negative for color change and rash.  Neurological: Negative for dizziness, light-headedness and headaches.  Psychiatric/Behavioral: Negative for dysphoric mood and agitation.       Objective:    Physical Exam  Constitutional: She appears well-developed and well-nourished. No distress.  HENT:  Nose: Nose normal.  Mouth/Throat: Oropharynx is clear and moist.  Eyes: Conjunctivae are normal. Right eye exhibits no discharge. Left eye exhibits no discharge.  Neck: Neck supple. No thyromegaly present.  Cardiovascular: Normal rate and regular rhythm.   Pulmonary/Chest: Breath sounds normal. No respiratory distress. She has no wheezes.  Abdominal: Soft. Bowel sounds are normal. There is no tenderness.  Musculoskeletal: She exhibits no edema or tenderness.  Lymphadenopathy:    She has no  cervical adenopathy.  Skin: No rash noted. No erythema.  Psychiatric: She has a normal mood and affect. Her behavior is normal.    BP 132/80 mmHg  Pulse 98  Temp(Src) 98.3 F (36.8 C) (Oral)  Resp 18  Ht '5\' 3"'$  (1.6 m)  Wt 176 lb 6 oz (80.003 kg)  BMI 31.25 kg/m2  SpO2 99% Wt Readings from Last 3 Encounters:  01/17/16 176 lb 6 oz (80.003 kg)  12/24/15 179 lb (81.194 kg)  12/07/15 178 lb (80.74 kg)     Lab Results  Component Value Date   WBC 10.3 11/18/2015   HGB 13.2 11/18/2015   HCT 40.8 11/18/2015   PLT 236 11/18/2015   GLUCOSE 123* 12/24/2015   CHOL  127 09/18/2015   TRIG 64.0 09/18/2015   HDL 51.10 09/18/2015   LDLDIRECT 183.4 08/31/2013   LDLCALC 63 09/18/2015   ALT 24 11/18/2015   AST 29 11/18/2015   NA 137 12/24/2015   K 4.1 12/24/2015   CL 103 12/24/2015   CREATININE 0.85 12/24/2015   BUN 12 12/24/2015   CO2 23 12/24/2015   TSH 1.59 09/18/2015   INR 1.1 02/05/2014   HGBA1C 7.9* 09/18/2015   MICROALBUR 2.6* 09/18/2015    Dg Foot Complete Left  12/07/2015  CLINICAL DATA:  82 year old who fell yesterday and injured the left foot. Initial encounter. EXAM: LEFT FOOT - COMPLETE 3+ VIEW COMPARISON:  None. FINDINGS: Comminuted oblique fracture involving the distal shaft of the 5th metatarsal. No other fractures. Generalized osseous demineralization. Narrowing of the IP joint spaces of the 2nd through 5th toes. Mild joint space narrowing involving the 1st MTP joint without erosion and mild hallux valgus. Remaining joint spaces relatively well preserved. IMPRESSION: 1. Acute traumatic comminuted oblique fracture involving the distal shaft of the 5th metatarsal. 2. Osteoarthritis involving the IP joints of the toes and the 1st MTP joint. Electronically Signed   By: Evangeline Dakin M.D.   On: 12/07/2015 12:17       Assessment & Plan:   Problem List Items Addressed This Visit    CVA (cerebral vascular accident) ([place])    On aspirin.  Stable.  Followed by neurology.          Depression    On zoloft.  Appears to be doing better.  Follow.       Diabetes mellitus ([place])    Discussed again low carb diet and exercise.  On metformin.  Not checking sugars.  Follow met b and a1c.        Relevant Orders   TSH   Hemoglobin A1c   Fall    Fell yesterday.  Slid - in her socks.  Some initial left shoulder pain.  No significant pain now.  No head injury.  Discussed falls.  Needs to use her cane/walker.  Feels more unsteady - gait.  Will have physical therapy reevaluate.  Have worked with her previously.        Relevant Orders   Ambulatory referral to Home Health   GERD (gastroesophageal reflux disease)    No acid reflux reported today.  On dexilant.  Follow.        Hypercholesterolemia    On lipitor.  Low cholesterol diet and exercise.  Follow lipid panel and liver function tests.        Relevant Orders   Lipid panel   Hepatic function panel   Hypertension    Blood pressure under good control.  Continue same medication regimen.  Follow pressures.  Follow metabolic panel.        Relevant Orders   Basic metabolic panel   Hypothyroidism    On thyroid replacement.  Follow tsh.        Neuropathy ([place])    May be contributing to unsteady gait.  On amitriptyline.  Follow.        Obesity    Discussed diet and exercise.        Unsteady gait - Primary    Followed by neurology.  Will have PT reevaluate given recent fall and persistent unsteadiness.        Relevant Orders   Ambulatory referral to [place]    Other Visit Diagnoses    Screening breast examination  Relevant Orders    MM DIGITAL SCREENING BILATERAL        Einar Pheasant, MD

## 2016-01-19 ENCOUNTER — Encounter: Payer: Self-pay | Admitting: Internal Medicine

## 2016-01-19 DIAGNOSIS — W19XXXA Unspecified fall, initial encounter: Secondary | ICD-10-CM | POA: Insufficient documentation

## 2016-01-19 NOTE — Assessment & Plan Note (Signed)
No acid reflux reported today.  On dexilant.  Follow.

## 2016-01-19 NOTE — Assessment & Plan Note (Signed)
On aspirin.  Stable.  Followed by neurology.

## 2016-01-19 NOTE — Assessment & Plan Note (Signed)
Fell yesterday.  Slid - in her socks.  Some initial left shoulder pain.  No significant pain now.  No head injury.  Discussed falls.  Needs to use her cane/walker.  Feels more unsteady - gait.  Will have physical therapy reevaluate.  Have worked with her previously.

## 2016-01-19 NOTE — Assessment & Plan Note (Signed)
On zoloft.  Appears to be doing better.  Follow.

## 2016-01-19 NOTE — Assessment & Plan Note (Signed)
Discussed diet and exercise 

## 2016-01-19 NOTE — Assessment & Plan Note (Signed)
On lipitor.  Low cholesterol diet and exercise.  Follow lipid panel and liver function tests.   

## 2016-01-19 NOTE — Assessment & Plan Note (Addendum)
Followed by neurology.  Will have PT reevaluate given recent fall and persistent unsteadiness.

## 2016-01-19 NOTE — Assessment & Plan Note (Signed)
Blood pressure under good control.  Continue same medication regimen.  Follow pressures.  Follow metabolic panel.   

## 2016-01-19 NOTE — Assessment & Plan Note (Signed)
On thyroid replacement.  Follow tsh.  

## 2016-01-19 NOTE — Assessment & Plan Note (Signed)
Discussed again low carb diet and exercise.  On metformin.  Not checking sugars.  Follow met b and a1c.

## 2016-01-19 NOTE — Assessment & Plan Note (Signed)
May be contributing to unsteady gait.  On amitriptyline.  Follow.

## 2016-01-21 ENCOUNTER — Other Ambulatory Visit (INDEPENDENT_AMBULATORY_CARE_PROVIDER_SITE_OTHER): Payer: Medicare Other

## 2016-01-21 DIAGNOSIS — E1140 Type 2 diabetes mellitus with diabetic neuropathy, unspecified: Secondary | ICD-10-CM

## 2016-01-21 DIAGNOSIS — E7800 Pure hypercholesterolemia, unspecified: Secondary | ICD-10-CM | POA: Diagnosis not present

## 2016-01-21 DIAGNOSIS — I10 Essential (primary) hypertension: Secondary | ICD-10-CM | POA: Diagnosis not present

## 2016-01-21 LAB — HEMOGLOBIN A1C: Hgb A1c MFr Bld: 7.5 % — ABNORMAL HIGH (ref 4.6–6.5)

## 2016-01-21 LAB — BASIC METABOLIC PANEL
BUN: 8 mg/dL (ref 6–23)
CO2: 25 mEq/L (ref 19–32)
Calcium: 9.1 mg/dL (ref 8.4–10.5)
Chloride: 105 mEq/L (ref 96–112)
Creatinine, Ser: 0.82 mg/dL (ref 0.40–1.20)
GFR: 70.99 mL/min (ref >60.00)
Glucose, Bld: 167 mg/dL — ABNORMAL HIGH (ref 70–99)
Potassium: 3.9 mEq/L (ref 3.5–5.1)
Sodium: 140 mEq/L (ref 135–145)

## 2016-01-21 LAB — HEPATIC FUNCTION PANEL
ALT: 16 U/L (ref 0–35)
AST: 16 U/L (ref 0–37)
Albumin: 4 g/dL (ref 3.5–5.2)
Alkaline Phosphatase: 88 U/L (ref 39–117)
Bilirubin, Direct: 0.2 mg/dL (ref 0.0–0.3)
Total Bilirubin: 1.1 mg/dL (ref 0.2–1.2)
Total Protein: 6.9 g/dL (ref 6.0–8.3)

## 2016-01-21 LAB — LIPID PANEL
Cholesterol: 170 mg/dL (ref 0–200)
HDL: 42.8 mg/dL (ref >39.00)
LDL Cholesterol: 103 mg/dL — ABNORMAL HIGH (ref 0–99)
NonHDL: 127.61
Total CHOL/HDL Ratio: 4
Triglycerides: 124 mg/dL (ref 0.0–149.0)
VLDL: 24.8 mg/dL (ref 0.0–40.0)

## 2016-01-21 LAB — TSH: TSH: 0.37 u[IU]/mL (ref 0.35–4.50)

## 2016-01-22 ENCOUNTER — Encounter: Payer: Self-pay | Admitting: Internal Medicine

## 2016-01-24 DIAGNOSIS — I10 Essential (primary) hypertension: Secondary | ICD-10-CM | POA: Diagnosis not present

## 2016-01-24 DIAGNOSIS — E1140 Type 2 diabetes mellitus with diabetic neuropathy, unspecified: Secondary | ICD-10-CM | POA: Diagnosis not present

## 2016-01-24 DIAGNOSIS — Z7984 Long term (current) use of oral hypoglycemic drugs: Secondary | ICD-10-CM | POA: Diagnosis not present

## 2016-01-24 DIAGNOSIS — I69354 Hemiplegia and hemiparesis following cerebral infarction affecting left non-dominant side: Secondary | ICD-10-CM | POA: Diagnosis not present

## 2016-01-24 DIAGNOSIS — Z9181 History of falling: Secondary | ICD-10-CM | POA: Diagnosis not present

## 2016-01-24 DIAGNOSIS — J449 Chronic obstructive pulmonary disease, unspecified: Secondary | ICD-10-CM | POA: Diagnosis not present

## 2016-01-24 DIAGNOSIS — F329 Major depressive disorder, single episode, unspecified: Secondary | ICD-10-CM | POA: Diagnosis not present

## 2016-01-27 DIAGNOSIS — I69354 Hemiplegia and hemiparesis following cerebral infarction affecting left non-dominant side: Secondary | ICD-10-CM | POA: Diagnosis not present

## 2016-01-27 DIAGNOSIS — J449 Chronic obstructive pulmonary disease, unspecified: Secondary | ICD-10-CM | POA: Diagnosis not present

## 2016-01-27 DIAGNOSIS — F329 Major depressive disorder, single episode, unspecified: Secondary | ICD-10-CM | POA: Diagnosis not present

## 2016-01-27 DIAGNOSIS — I10 Essential (primary) hypertension: Secondary | ICD-10-CM | POA: Diagnosis not present

## 2016-01-27 DIAGNOSIS — Z7984 Long term (current) use of oral hypoglycemic drugs: Secondary | ICD-10-CM | POA: Diagnosis not present

## 2016-01-27 DIAGNOSIS — E1140 Type 2 diabetes mellitus with diabetic neuropathy, unspecified: Secondary | ICD-10-CM | POA: Diagnosis not present

## 2016-01-29 ENCOUNTER — Encounter: Payer: Self-pay | Admitting: Obstetrics and Gynecology

## 2016-01-29 ENCOUNTER — Ambulatory Visit (INDEPENDENT_AMBULATORY_CARE_PROVIDER_SITE_OTHER): Payer: Medicare Other | Admitting: Obstetrics and Gynecology

## 2016-01-29 VITALS — BP 156/80 | HR 86 | Ht 63.0 in | Wt 177.7 lb

## 2016-01-29 DIAGNOSIS — N952 Postmenopausal atrophic vaginitis: Secondary | ICD-10-CM

## 2016-01-29 DIAGNOSIS — I639 Cerebral infarction, unspecified: Secondary | ICD-10-CM

## 2016-01-29 DIAGNOSIS — R339 Retention of urine, unspecified: Secondary | ICD-10-CM | POA: Diagnosis not present

## 2016-01-29 DIAGNOSIS — E039 Hypothyroidism, unspecified: Secondary | ICD-10-CM | POA: Insufficient documentation

## 2016-01-29 LAB — BLADDER SCAN AMB NON-IMAGING

## 2016-01-29 NOTE — Addendum Note (Signed)
Addended by: Bettye Boeck on: 01/29/2016 04:54 PM   Modules accepted: Orders

## 2016-01-29 NOTE — Progress Notes (Signed)
10:54 AM   Sandra Kaufman [birth year] [id number]  Referring provider: Einar Pheasant, MD [id number] [place], [place] [address]  Chief Complaint  Patient presents with  . Urinary Retention    83month  . Follow-up    HPI: Patient is an 82yo female with past history of hypertension, previous CVA, hypercholesterolemia, hypothyroidism, GERD and recent falls presenting today for follow-up after an acute episode of urinary retention. She was seen in the emergency department on 11/18/15 and a Foley catheter was placed with an output of 800 mL's. Her UA was unremarkable at that time. She does have a history of urinary tract infections. Most recent was 4 months ago.  Interval History:  Resistance today for follow-up for urinary retention as well as vaginal atrophy. She reports that she is not experiencing any urinary symptoms at this time. She feels like she is able to completely empty her bladder. She has not been using her vaginal estrogen cream as directed because she does not feel that she needs it. She is not experiencing any dysuria or vaginal irritation.  PMH: Past Medical History  Diagnosis Date  . Hypertension   . Hypercholesterolemia   . Hypothyroidism   . COPD (chronic obstructive pulmonary disease) ([place])   . GERD (gastroesophageal reflux disease)     with esophageal stricture requiring dilatation x 2 (12/96 and 10/99)  . Arthritis   . Glaucoma   . Peripheral neuropathy ([place])   . History of chicken pox   . Diverticulosis   . Migraines   . Colon polyps   . Urinary incontinence     Surgical History: Past Surgical History  Procedure Laterality Date  . Abdominal hysterectomy  1961    ovaries not removed  . Tonsillectomy    . Appendectomy    . Bladder tack  1976  . Cataract extraction  1997    bilateral  . Cholecystectomy    . Interstim implant placement  2011    Home Medications:    Medication List       This list is accurate as of: 01/29/16  10:54 AM.  Always use your most recent med list.               albuterol 108 (90 Base) MCG/ACT inhaler  Commonly known as:  PROVENTIL HFA;VENTOLIN HFA  Inhale 2 puffs into the lungs every 6 (six) hours as needed for wheezing or shortness of breath.     amitriptyline 10 MG tablet  Commonly known as:  ELAVIL  TAKE 2 TABLETS (20 MG TOTAL) BY MOUTH AT BEDTIME.     aspirin 325 MG tablet  Take 325 mg by mouth daily.     atorvastatin 40 MG tablet  Commonly known as:  LIPITOR  TAKE 1 TABLET (40 MG TOTAL) BY MOUTH DAILY.     LUMIGAN 0.01 % Soln  Generic drug:  bimatoprost     bimatoprost 0.03 % ophthalmic solution  Commonly known as:  LUMIGAN  Place 1 drop into both eyes at bedtime.     brinzolamide 1 % ophthalmic suspension  Commonly known as:  AZOPT  Place 1 drop into both eyes 2 (two) times daily.     Co Q10 100 MG Caps  Take 1 capsule by mouth daily.     cyanocobalamin 2000 MCG tablet  Take 2,000 mcg by mouth daily.     dexlansoprazole 60 MG capsule  Commonly known as:  DEXILANT  Take 60 mg by mouth daily.  fluticasone 110 MCG/ACT inhaler  Commonly known as:  FLOVENT HFA  Inhale 2 puffs into the lungs 2 (two) times daily.     fluticasone 50 MCG/ACT nasal spray  Commonly known as:  FLONASE  PLACE 2 SPRAYS INTO THE NOSE DAILY.     irbesartan 150 MG tablet  Commonly known as:  AVAPRO  TAKE 1 TABLET BY MOUTH EVERY DAY     losartan 50 MG tablet  Commonly known as:  COZAAR  Take 150 mg by mouth daily.     METAMUCIL 0.52 g capsule  Generic drug:  psyllium  Take 0.228 g by mouth 2 (two) times daily. 4 capsules bid     metFORMIN 500 MG tablet  Commonly known as:  GLUCOPHAGE  TAKE ONE TABLET BY MOUTH DAILY     MYRBETRIQ 25 MG Tb24 tablet  Generic drug:  mirabegron ER  Take 25 mg by mouth daily.     NAMENDA 10 MG tablet  Generic drug:  memantine  TAKE 1 TABLET BY MOUTH TWICE A DAY     pantoprazole 40 MG tablet  Commonly known as:  PROTONIX  Take 1 tablet  (40 mg total) by mouth daily.     ranitidine 300 MG tablet  Commonly known as:  ZANTAC  Take 1 tablet (300 mg total) by mouth daily.     sertraline 100 MG tablet  Commonly known as:  ZOLOFT  TAKE 1 TABLET (100 MG TOTAL) BY MOUTH DAILY.     SYNTHROID 112 MCG tablet  Generic drug:  levothyroxine  TAKE 1 TABLET (112 MCG TOTAL) BY MOUTH DAILY.     levothyroxine 100 MCG tablet  Commonly known as:  SYNTHROID, LEVOTHROID  TAKE 1 TABLET (100 MCG TOTAL) BY MOUTH DAILY BEFORE BREAKFAST.     triamcinolone cream 0.1 %  Commonly known as:  KENALOG  APPLY TO AFFECTED AREA ON BODY AS NEEDED FOR ITCH        Allergies:  Allergies  Allergen Reactions  . Tramadol     Family History: Family History  Problem Relation Age of Onset  . Heart disease Father     myocardial infarction  . Arthritis/Rheumatoid Mother   . Diabetes Sister   . Epilepsy Sister   . Breast cancer Neg Hx   . Colon cancer Neg Hx     Social History:  reports that she has never smoked. She has never used smokeless tobacco. She reports that she does not drink alcohol or use illicit drugs.  ROS: UROLOGY Frequent Urination?: No Hard to postpone urination?: No Burning/pain with urination?: No Get up at night to urinate?: No Leakage of urine?: No Urine stream starts and stops?: No Trouble starting stream?: No Do you have to strain to urinate?: No Blood in urine?: No Urinary tract infection?: No Sexually transmitted disease?: No Injury to kidneys or bladder?: No Painful intercourse?: No Weak stream?: No Currently pregnant?: No Vaginal bleeding?: No Last menstrual period?: n  Gastrointestinal Nausea?: No Vomiting?: No Indigestion/heartburn?: No Diarrhea?: No Constipation?: No  Constitutional Fever: No Night sweats?: No Weight loss?: No Fatigue?: No  Skin Skin rash/lesions?: No Itching?: No  Eyes Blurred vision?: No Double vision?: No  Ears/Nose/Throat Sore throat?: No Sinus problems?:  No  Hematologic/Lymphatic Swollen glands?: No Easy bruising?: No  Cardiovascular Leg swelling?: No Chest pain?: No  Respiratory Cough?: No Shortness of breath?: No  Endocrine Excessive thirst?: No  Musculoskeletal Back pain?: No Joint pain?: No  Neurological Headaches?: No Dizziness?: No  Psychologic Depression?: No  Anxiety?: No  Physical Exam: BP 156/80 mmHg  Pulse 86  Ht 5\' 3"  (1.6 m)  Wt 177 lb 11.2 oz (80.604 kg)  BMI 31.49 kg/m2  Constitutional:  Alert and oriented, No acute distress. HEENT: [place] AT, moist mucus membranes.  Trachea midline, no masses. Cardiovascular: No clubbing, cyanosis, or edema. Respiratory: Normal respiratory effort, no increased work of breathing. GI: Abdomen is soft, nontender, nondistended, no abdominal masses GU: No CVA tenderness.  Skin: No rashes, bruises or suspicious lesions. Lymph: No cervical or inguinal adenopathy. Neurologic: Grossly intact, no focal deficits, moving all 4 extremities. Psychiatric: Normal mood and affect.  Laboratory Data:   Urinalysis Results for orders placed or performed in visit on 01/29/16  BLADDER SCAN AMB NON-IMAGING  Result Value Ref Range   Scan Result 65ml     Pertinent Imaging:   Assessment & Plan:   1. Urinary Retention- Resolved.  PVR 63ml. Patient advised to notify our office should see p.m. to experience any symptoms of retention in the future.   2. Vaginal Atrophy- She states that she has not been using her vaginal itching cream as directed. She does not feel that she needs it. Currently asymptomatic at this time.  There are no diagnoses linked to this encounter.  No Follow-up on file.  These notes generated with voice recognition software. I apologize for typographical errors.  Herbert Moors, [facility] [address] [place], [place] [address] [phone]

## 2016-01-30 DIAGNOSIS — E1140 Type 2 diabetes mellitus with diabetic neuropathy, unspecified: Secondary | ICD-10-CM | POA: Diagnosis not present

## 2016-01-30 DIAGNOSIS — I69354 Hemiplegia and hemiparesis following cerebral infarction affecting left non-dominant side: Secondary | ICD-10-CM | POA: Diagnosis not present

## 2016-01-30 DIAGNOSIS — J449 Chronic obstructive pulmonary disease, unspecified: Secondary | ICD-10-CM | POA: Diagnosis not present

## 2016-01-30 DIAGNOSIS — F329 Major depressive disorder, single episode, unspecified: Secondary | ICD-10-CM | POA: Diagnosis not present

## 2016-01-30 DIAGNOSIS — Z7984 Long term (current) use of oral hypoglycemic drugs: Secondary | ICD-10-CM | POA: Diagnosis not present

## 2016-01-30 DIAGNOSIS — I10 Essential (primary) hypertension: Secondary | ICD-10-CM | POA: Diagnosis not present

## 2016-02-04 DIAGNOSIS — F329 Major depressive disorder, single episode, unspecified: Secondary | ICD-10-CM | POA: Diagnosis not present

## 2016-02-04 DIAGNOSIS — J449 Chronic obstructive pulmonary disease, unspecified: Secondary | ICD-10-CM | POA: Diagnosis not present

## 2016-02-04 DIAGNOSIS — E1140 Type 2 diabetes mellitus with diabetic neuropathy, unspecified: Secondary | ICD-10-CM | POA: Diagnosis not present

## 2016-02-04 DIAGNOSIS — I10 Essential (primary) hypertension: Secondary | ICD-10-CM | POA: Diagnosis not present

## 2016-02-04 DIAGNOSIS — Z7984 Long term (current) use of oral hypoglycemic drugs: Secondary | ICD-10-CM | POA: Diagnosis not present

## 2016-02-04 DIAGNOSIS — I69354 Hemiplegia and hemiparesis following cerebral infarction affecting left non-dominant side: Secondary | ICD-10-CM | POA: Diagnosis not present

## 2016-02-06 DIAGNOSIS — F329 Major depressive disorder, single episode, unspecified: Secondary | ICD-10-CM | POA: Diagnosis not present

## 2016-02-06 DIAGNOSIS — E1140 Type 2 diabetes mellitus with diabetic neuropathy, unspecified: Secondary | ICD-10-CM | POA: Diagnosis not present

## 2016-02-06 DIAGNOSIS — J449 Chronic obstructive pulmonary disease, unspecified: Secondary | ICD-10-CM | POA: Diagnosis not present

## 2016-02-06 DIAGNOSIS — I69354 Hemiplegia and hemiparesis following cerebral infarction affecting left non-dominant side: Secondary | ICD-10-CM | POA: Diagnosis not present

## 2016-02-06 DIAGNOSIS — Z7984 Long term (current) use of oral hypoglycemic drugs: Secondary | ICD-10-CM | POA: Diagnosis not present

## 2016-02-06 DIAGNOSIS — I10 Essential (primary) hypertension: Secondary | ICD-10-CM | POA: Diagnosis not present

## 2016-02-10 DIAGNOSIS — I69354 Hemiplegia and hemiparesis following cerebral infarction affecting left non-dominant side: Secondary | ICD-10-CM | POA: Diagnosis not present

## 2016-02-10 DIAGNOSIS — E1140 Type 2 diabetes mellitus with diabetic neuropathy, unspecified: Secondary | ICD-10-CM | POA: Diagnosis not present

## 2016-02-10 DIAGNOSIS — F329 Major depressive disorder, single episode, unspecified: Secondary | ICD-10-CM | POA: Diagnosis not present

## 2016-02-10 DIAGNOSIS — J449 Chronic obstructive pulmonary disease, unspecified: Secondary | ICD-10-CM | POA: Diagnosis not present

## 2016-02-10 DIAGNOSIS — I10 Essential (primary) hypertension: Secondary | ICD-10-CM | POA: Diagnosis not present

## 2016-02-10 DIAGNOSIS — Z7984 Long term (current) use of oral hypoglycemic drugs: Secondary | ICD-10-CM | POA: Diagnosis not present

## 2016-02-13 DIAGNOSIS — J449 Chronic obstructive pulmonary disease, unspecified: Secondary | ICD-10-CM | POA: Diagnosis not present

## 2016-02-13 DIAGNOSIS — Z7984 Long term (current) use of oral hypoglycemic drugs: Secondary | ICD-10-CM | POA: Diagnosis not present

## 2016-02-13 DIAGNOSIS — E1140 Type 2 diabetes mellitus with diabetic neuropathy, unspecified: Secondary | ICD-10-CM | POA: Diagnosis not present

## 2016-02-13 DIAGNOSIS — I10 Essential (primary) hypertension: Secondary | ICD-10-CM | POA: Diagnosis not present

## 2016-02-13 DIAGNOSIS — F329 Major depressive disorder, single episode, unspecified: Secondary | ICD-10-CM | POA: Diagnosis not present

## 2016-02-13 DIAGNOSIS — I69354 Hemiplegia and hemiparesis following cerebral infarction affecting left non-dominant side: Secondary | ICD-10-CM | POA: Diagnosis not present

## 2016-02-14 ENCOUNTER — Other Ambulatory Visit: Payer: Self-pay | Admitting: Internal Medicine

## 2016-02-17 DIAGNOSIS — I10 Essential (primary) hypertension: Secondary | ICD-10-CM | POA: Diagnosis not present

## 2016-02-17 DIAGNOSIS — Z7984 Long term (current) use of oral hypoglycemic drugs: Secondary | ICD-10-CM | POA: Diagnosis not present

## 2016-02-17 DIAGNOSIS — J449 Chronic obstructive pulmonary disease, unspecified: Secondary | ICD-10-CM | POA: Diagnosis not present

## 2016-02-17 DIAGNOSIS — F329 Major depressive disorder, single episode, unspecified: Secondary | ICD-10-CM | POA: Diagnosis not present

## 2016-02-17 DIAGNOSIS — E1140 Type 2 diabetes mellitus with diabetic neuropathy, unspecified: Secondary | ICD-10-CM | POA: Diagnosis not present

## 2016-02-17 DIAGNOSIS — I69354 Hemiplegia and hemiparesis following cerebral infarction affecting left non-dominant side: Secondary | ICD-10-CM | POA: Diagnosis not present

## 2016-02-18 ENCOUNTER — Ambulatory Visit (INDEPENDENT_AMBULATORY_CARE_PROVIDER_SITE_OTHER)
Admission: RE | Admit: 2016-02-18 | Discharge: 2016-02-18 | Disposition: A | Discharge status: Home / Self Care | Payer: Medicare Other | Source: Ambulatory Visit | Attending: Family Medicine | Admitting: Family Medicine

## 2016-02-18 ENCOUNTER — Ambulatory Visit (INDEPENDENT_AMBULATORY_CARE_PROVIDER_SITE_OTHER): Payer: Medicare Other | Admitting: Family Medicine

## 2016-02-18 ENCOUNTER — Encounter: Payer: Self-pay | Admitting: Family Medicine

## 2016-02-18 VITALS — BP 132/86 | HR 100 | Temp 98.2°F | Wt 173.5 lb

## 2016-02-18 DIAGNOSIS — I639 Cerebral infarction, unspecified: Secondary | ICD-10-CM | POA: Diagnosis not present

## 2016-02-18 DIAGNOSIS — R197 Diarrhea, unspecified: Secondary | ICD-10-CM | POA: Insufficient documentation

## 2016-02-18 DIAGNOSIS — R05 Cough: Secondary | ICD-10-CM | POA: Diagnosis not present

## 2016-02-18 DIAGNOSIS — J209 Acute bronchitis, unspecified: Secondary | ICD-10-CM

## 2016-02-18 MED ORDER — PREDNISONE 50 MG PO TABS: ORAL_TABLET | ORAL | Status: DC

## 2016-02-18 MED ORDER — HYDROCOD POLST-CPM POLST ER 10-8 MG/5ML PO SUER: 5.0000 mL | Freq: Two times a day (BID) | ORAL | Status: DC | PRN

## 2016-02-18 NOTE — Progress Notes (Signed)
Pre visit review using our clinic review tool, if applicable. No additional management support is needed unless otherwise documented below in the visit note. 

## 2016-02-18 NOTE — Progress Notes (Signed)
Subjective:  Patient ID: Sandra Kaufman, female    DOB: 1932-11-13  Age: 82 y.o. MRN: [id number]  CC: Cough, diarrhea  HPI:  82 year old female with a complicated past medical history including COPD (I cannot not find any records/pfts to support this however) who presents with the above complaints.  Patient reports a two-week history of nonproductive cough. No reported shortness of breath. No exacerbating or relieving factors. No interventions tried. Her caregiver reports that she's had a low-grade fever. Additionally, she's had watery diarrhea per her report. No recent antibiotic use. She states that she is eating and drinking well. No other reported symptoms.  Social Hx   Social History   Social History  . Marital Status: Widowed    Spouse Name: N/A  . Number of Children: 2  . Years of Education: N/A   Social History Main Topics  . Smoking status: Never Smoker   . Smokeless tobacco: Never Used  . Alcohol Use: No  . Drug Use: No  . Sexual Activity: Not Asked   Other Topics Concern  . None   Social History Narrative   Review of Systems  Respiratory: Positive for cough.   Gastrointestinal: Positive for diarrhea.   Objective:  BP 132/86 mmHg  Pulse 100  Temp(Src) 98.2 F (36.8 C) (Oral)  Wt 173 lb 8 oz (78.699 kg)  SpO2 95%  BP/Weight 02/18/2016 01/29/2016 [id number]  Systolic BP [id number] [id number]  Diastolic BP 86 80 80  Wt. (Lbs) 173.5 177.7 176.38  BMI 30.74 31.49 31.25   Physical Exam  Constitutional: She appears well-developed.  Chronically ill-appearing. No acute distress.  HENT:  Head: Normocephalic and atraumatic.  Mouth/Throat: Oropharynx is clear and moist.  TM's obscured by cerumen bilaterally.   Cardiovascular: Regular rhythm.  Tachycardia present.   Pulmonary/Chest: Effort normal. She has no wheezes. She has no rales.  Neurological: She is alert.  Psychiatric: She has a normal mood and affect.  Vitals reviewed.  Lab Results  Component Value Date   WBC 10.3 11/18/2015   HGB 13.2 11/18/2015   HCT 40.8 11/18/2015   PLT 236 11/18/2015   GLUCOSE 167* 01/21/2016   CHOL 170 01/21/2016   TRIG 124.0 01/21/2016   HDL 42.80 01/21/2016   LDLDIRECT 183.4 08/31/2013   LDLCALC 103* 01/21/2016   ALT 16 01/21/2016   AST 16 01/21/2016   NA 140 01/21/2016   K 3.9 01/21/2016   CL 105 01/21/2016   CREATININE 0.82 01/21/2016   BUN 8 01/21/2016   CO2 25 01/21/2016   TSH 0.37 01/21/2016   INR 1.1 02/05/2014   HGBA1C 7.5* 01/21/2016   MICROALBUR 2.6* 09/18/2015    Assessment & Plan:   Problem List Items Addressed This Visit    Acute bronchitis - Primary    New problem. Given past medical history, I'm obtaining a chest x-ray. Treating with prednisone and tussionex (to be used sparingly). I cannot find any record of confirmation regarding COPD. We'll not treat with antibiotics as a result. Also not treatment about this given current diarrhea.      Relevant Orders   DG Chest 2 View   Diarrhea    New problem. Exam unremarkable. Obtaining C diff.      Relevant Orders   Clostridium Difficile by PCR      Meds ordered this encounter  Medications  . chlorpheniramine-HYDROcodone (TUSSIONEX PENNKINETIC ER) 10-8 MG/5ML SUER    Sig: Take 5 mLs by mouth every 12 (twelve) hours as needed.    Dispense:  115 mL    Refill:  0  . predniSONE (DELTASONE) 50 MG tablet    Sig: 1 tablet daily x 5 days.    Dispense:  5 tablet    Refill:  0    Follow-up: PRN  [place]

## 2016-02-18 NOTE — Assessment & Plan Note (Signed)
New problem. Exam unremarkable. Obtaining C diff.

## 2016-02-18 NOTE — Patient Instructions (Addendum)
Go get the chest xray.  Start the prednisone and use the cough medicine sparingly (it will make her more apt to fall).  We wil call with results.  Take care  Dr. Lacinda Axon.

## 2016-02-18 NOTE — Assessment & Plan Note (Signed)
New problem. Given past medical history, I'm obtaining a chest x-ray. Treating with prednisone and tussionex (to be used sparingly). I cannot find any record of confirmation regarding COPD. We'll not treat with antibiotics as a result. Also not treatment about this given current diarrhea.

## 2016-02-27 ENCOUNTER — Other Ambulatory Visit: Payer: Self-pay | Admitting: Internal Medicine

## 2016-02-27 ENCOUNTER — Ambulatory Visit
Admission: RE | Admit: 2016-02-27 | Discharge: 2016-02-27 | Disposition: A | Discharge status: Home / Self Care | Payer: Medicare Other | Source: Ambulatory Visit | Attending: Internal Medicine | Admitting: Internal Medicine

## 2016-02-27 ENCOUNTER — Other Ambulatory Visit: Payer: Self-pay | Admitting: *Deleted

## 2016-02-27 ENCOUNTER — Other Ambulatory Visit: Payer: Medicare Other

## 2016-02-27 DIAGNOSIS — Z1239 Encounter for other screening for malignant neoplasm of breast: Secondary | ICD-10-CM

## 2016-02-27 DIAGNOSIS — R197 Diarrhea, unspecified: Secondary | ICD-10-CM

## 2016-02-27 DIAGNOSIS — Z1231 Encounter for screening mammogram for malignant neoplasm of breast: Secondary | ICD-10-CM

## 2016-02-27 HISTORY — DX: Malignant (primary) neoplasm, unspecified: C80.1

## 2016-02-28 LAB — CLOSTRIDIUM DIFFICILE BY PCR: Toxigenic C. Difficile by PCR: NOT DETECTED

## 2016-03-02 ENCOUNTER — Other Ambulatory Visit: Payer: Self-pay | Admitting: Gastroenterology

## 2016-03-02 ENCOUNTER — Other Ambulatory Visit: Payer: Self-pay | Admitting: Internal Medicine

## 2016-03-09 ENCOUNTER — Other Ambulatory Visit: Payer: Self-pay

## 2016-03-09 DIAGNOSIS — K2100 Gastro-esophageal reflux disease with esophagitis, without bleeding: Secondary | ICD-10-CM

## 2016-03-09 MED ORDER — METFORMIN HCL 500 MG PO TABS: 500.0000 mg | ORAL_TABLET | Freq: Every day | ORAL | Status: DC

## 2016-03-09 MED ORDER — PANTOPRAZOLE SODIUM 40 MG PO TBEC: 40.0000 mg | DELAYED_RELEASE_TABLET | Freq: Every day | ORAL | Status: DC

## 2016-03-17 ENCOUNTER — Ambulatory Visit (INDEPENDENT_AMBULATORY_CARE_PROVIDER_SITE_OTHER): Payer: Medicare Other | Admitting: Family Medicine

## 2016-03-17 ENCOUNTER — Encounter: Payer: Self-pay | Admitting: Family Medicine

## 2016-03-17 VITALS — BP 134/78 | HR 88 | Temp 98.2°F | Wt 168.6 lb

## 2016-03-17 DIAGNOSIS — R112 Nausea with vomiting, unspecified: Secondary | ICD-10-CM

## 2016-03-17 DIAGNOSIS — R1110 Vomiting, unspecified: Secondary | ICD-10-CM | POA: Insufficient documentation

## 2016-03-17 DIAGNOSIS — R197 Diarrhea, unspecified: Secondary | ICD-10-CM | POA: Diagnosis not present

## 2016-03-17 DIAGNOSIS — R21 Rash and other nonspecific skin eruption: Secondary | ICD-10-CM | POA: Diagnosis not present

## 2016-03-17 DIAGNOSIS — I639 Cerebral infarction, unspecified: Secondary | ICD-10-CM | POA: Diagnosis not present

## 2016-03-17 LAB — COMPREHENSIVE METABOLIC PANEL
ALT: 11 U/L (ref 0–35)
AST: 13 U/L (ref 0–37)
Albumin: 4.1 g/dL (ref 3.5–5.2)
Alkaline Phosphatase: 83 U/L (ref 39–117)
BUN: 7 mg/dL (ref 6–23)
CO2: 24 mEq/L (ref 19–32)
Calcium: 9.5 mg/dL (ref 8.4–10.5)
Chloride: 104 mEq/L (ref 96–112)
Creatinine, Ser: 0.8 mg/dL (ref 0.40–1.20)
GFR: 73.02 mL/min (ref >60.00)
Glucose, Bld: 195 mg/dL — ABNORMAL HIGH (ref 70–99)
Potassium: 3.6 mEq/L (ref 3.5–5.1)
Sodium: 138 mEq/L (ref 135–145)
Total Bilirubin: 1 mg/dL (ref 0.2–1.2)
Total Protein: 7.3 g/dL (ref 6.0–8.3)

## 2016-03-17 MED ORDER — BETAMETHASONE VALERATE 0.1 % EX OINT: 1.0000 "application " | TOPICAL_OINTMENT | Freq: Two times a day (BID) | CUTANEOUS | Status: DC

## 2016-03-17 NOTE — Progress Notes (Signed)
Pre visit review using our clinic review tool, if applicable. No additional management support is needed unless otherwise documented below in the visit note. 

## 2016-03-17 NOTE — Assessment & Plan Note (Signed)
Exam unremarkable. History limited. Obtaining C diff. Supportive care.

## 2016-03-17 NOTE — Patient Instructions (Signed)
Use the betamethasone for the rash.  Supportive care for the diarrhea.   We will obtain lab work today.    Follow up closely with Dr. Nicki Reaper.  Take care  Dr. Lacinda Axon

## 2016-03-17 NOTE — Assessment & Plan Note (Signed)
New problem. No discrete rash noted. Treating empirically with Betamethasone given response to Triamcinolone.

## 2016-03-17 NOTE — Progress Notes (Signed)
Subjective:  Patient ID: Sandra Kaufman, female    DOB: 02-24-1932  Age: 82 y.o. MRN: [id number]  CC: Nausea/vomiting, Diarrhea, Rash  HPI:  82 year old female with a complicated past medical history presents to clinic today with the above complaints  History limited by dementia.  Patient reports a 3 week history of diarrhea. I had seen her for this previously.  Her caregiver/aid with her today. She reports that she has been vomiting intermittently for the past few days. She states that she tends to vomit after meals. No known inciting factor. She does report that her abdomen has been tender. No exacerbating or relieving factors.  Additionally, she's had a rash on her lower back the past 2 weeks. She been using triamcinolone with some improvement. No known exacerbating factors. No new changes or exposures.  Social Hx   Social History   Social History  . Marital Status: Widowed    Spouse Name: N/A  . Number of Children: 2  . Years of Education: N/A   Social History Main Topics  . Smoking status: Never Smoker   . Smokeless tobacco: Never Used  . Alcohol Use: No  . Drug Use: No  . Sexual Activity: Not Asked   Other Topics Concern  . None   Social History Narrative   Review of Systems  Gastrointestinal: Positive for nausea and vomiting.  Skin: Positive for rash.   Objective:  BP 134/78 mmHg  Pulse 88  Temp(Src) 98.2 F (36.8 C) (Oral)  Wt 168 lb 9 oz (76.459 kg)  SpO2 96%  BP/Weight 03/17/2016 [id number]  Systolic BP Q000111Q Q000111Q [id number]  Diastolic BP 78 86 80  Wt. (Lbs) 168.56 173.5 177.7  BMI 29.87 30.74 31.49   Physical Exam  Constitutional:  Elderly female in NAD.   Cardiovascular: Normal rate and regular rhythm.   Pulmonary/Chest: Effort normal and breath sounds normal.  Abdominal: Soft. She exhibits no distension.  No discrete areas of tenderness. No rebound or guarding.  Neurological: She is alert.  Skin:  Back with excoriation noted. No discrete  rash noted.   Vitals reviewed.  Lab Results  Component Value Date   WBC 10.3 11/18/2015   HGB 13.2 11/18/2015   HCT 40.8 11/18/2015   PLT 236 11/18/2015   GLUCOSE 167* 01/21/2016   CHOL 170 01/21/2016   TRIG 124.0 01/21/2016   HDL 42.80 01/21/2016   LDLDIRECT 183.4 08/31/2013   LDLCALC 103* 01/21/2016   ALT 16 01/21/2016   AST 16 01/21/2016   NA 140 01/21/2016   K 3.9 01/21/2016   CL 105 01/21/2016   CREATININE 0.82 01/21/2016   BUN 8 01/21/2016   CO2 25 01/21/2016   TSH 0.37 01/21/2016   INR 1.1 02/05/2014   HGBA1C 7.5* 01/21/2016   MICROALBUR 2.6* 09/18/2015   Assessment & Plan:   Problem List Items Addressed This Visit    Vomiting    New problem. Exam unremarkable. Unclear etiology (could be from diabetic gastroparesis). Obtaining labs today. Needs follow up with PCP.      Rash    New problem. No discrete rash noted. Treating empirically with Betamethasone given response to Triamcinolone.       Diarrhea - Primary    Exam unremarkable. History limited. Obtaining C diff. Supportive care.      Relevant Orders   Comprehensive metabolic panel      Meds ordered this encounter  Medications  . betamethasone valerate ointment (VALISONE) 0.1 %    Sig: Apply 1 application  topically 2 (two) times daily. Do not use for more than 2 weeks consecutively.    Dispense:  30 g    Refill:  0    Follow-up: PRN  [place]

## 2016-03-17 NOTE — Assessment & Plan Note (Signed)
New problem. Exam unremarkable. Unclear etiology (could be from diabetic gastroparesis). Obtaining labs today. Needs follow up with PCP.

## 2016-03-19 ENCOUNTER — Telehealth: Payer: Self-pay | Admitting: *Deleted

## 2016-03-19 ENCOUNTER — Other Ambulatory Visit: Payer: Self-pay | Admitting: *Deleted

## 2016-03-19 DIAGNOSIS — R197 Diarrhea, unspecified: Secondary | ICD-10-CM

## 2016-03-20 DIAGNOSIS — I69354 Hemiplegia and hemiparesis following cerebral infarction affecting left non-dominant side: Secondary | ICD-10-CM | POA: Diagnosis not present

## 2016-03-20 DIAGNOSIS — E1140 Type 2 diabetes mellitus with diabetic neuropathy, unspecified: Secondary | ICD-10-CM | POA: Diagnosis not present

## 2016-03-20 DIAGNOSIS — I10 Essential (primary) hypertension: Secondary | ICD-10-CM | POA: Diagnosis not present

## 2016-03-20 DIAGNOSIS — J449 Chronic obstructive pulmonary disease, unspecified: Secondary | ICD-10-CM | POA: Diagnosis not present

## 2016-03-23 NOTE — Telephone Encounter (Signed)
Stool sample

## 2016-03-24 DIAGNOSIS — R197 Diarrhea, unspecified: Secondary | ICD-10-CM | POA: Diagnosis not present

## 2016-03-24 NOTE — Telephone Encounter (Signed)
Per Dr Jonathon Jordan last note on pt (03/17/16) - requested C. Diff.  Test.  If still having diarrhea, can obtain.

## 2016-03-24 NOTE — Telephone Encounter (Signed)
Test ordered. Specimen was dated from 03/19/16. Will send anyway

## 2016-03-25 LAB — CLOSTRIDIUM DIFFICILE BY PCR

## 2016-03-26 ENCOUNTER — Ambulatory Visit (INDEPENDENT_AMBULATORY_CARE_PROVIDER_SITE_OTHER): Payer: Medicare Other | Admitting: Internal Medicine

## 2016-03-26 ENCOUNTER — Encounter: Payer: Self-pay | Admitting: Internal Medicine

## 2016-03-26 ENCOUNTER — Encounter: Payer: Self-pay | Admitting: *Deleted

## 2016-03-26 VITALS — BP 120/80 | HR 100 | Temp 97.7°F | Resp 18 | Ht 63.0 in | Wt 170.2 lb

## 2016-03-26 DIAGNOSIS — R197 Diarrhea, unspecified: Secondary | ICD-10-CM

## 2016-03-26 DIAGNOSIS — K219 Gastro-esophageal reflux disease without esophagitis: Secondary | ICD-10-CM

## 2016-03-26 DIAGNOSIS — I639 Cerebral infarction, unspecified: Secondary | ICD-10-CM | POA: Diagnosis not present

## 2016-03-26 DIAGNOSIS — I10 Essential (primary) hypertension: Secondary | ICD-10-CM

## 2016-03-26 DIAGNOSIS — E1140 Type 2 diabetes mellitus with diabetic neuropathy, unspecified: Secondary | ICD-10-CM

## 2016-03-26 DIAGNOSIS — E7800 Pure hypercholesterolemia, unspecified: Secondary | ICD-10-CM

## 2016-03-26 DIAGNOSIS — R1031 Right lower quadrant pain: Secondary | ICD-10-CM

## 2016-03-26 NOTE — Progress Notes (Signed)
Patient ID: Sandra Kaufman, female   DOB: May 15, 1932, 82 y.o.   MRN: [id number]   Subjective:    Patient ID: Sandra Kaufman, female    DOB: May 25, 1932, 82 y.o.   MRN: [id number]  HPI  Patient here for a scheduled follow up.  She comes in today as a work in to discuss some persistent diarrhea.  She was seen by Dr Lacinda Axon recently.  See his note for details.  Ordered C.Diff.  No results yet.  I had instructed her to start a probiotic.  Also, I had instructed her to stop metformin.  She only stopped metformin for one day.  Diarrhea better when stopped.  Not as bad with taking probiotic.  She does report some right lower side pain.  Persistent.  Eating.  No nausea or vomiting.  She has adjusted her diet.  Not eating as many sweets.  We discussed diabetes.  Discussed diet adjustment.     Past Medical History  Diagnosis Date  . Hypertension   . Hypercholesterolemia   . Hypothyroidism   . COPD (chronic obstructive pulmonary disease) ([place])   . GERD (gastroesophageal reflux disease)     with esophageal stricture requiring dilatation x 2 (12/96 and 10/99)  . Arthritis   . Glaucoma   . Peripheral neuropathy ([place])   . History of chicken pox   . Diverticulosis   . Migraines   . Colon polyps   . Urinary incontinence   . Cancer [facility])     skin   Past Surgical History  Procedure Laterality Date  . Abdominal hysterectomy  1961    ovaries not removed  . Tonsillectomy    . Appendectomy    . Bladder tack  1976  . Cataract extraction  1997    bilateral  . Cholecystectomy    . Interstim implant placement  2011   Family History  Problem Relation Age of Onset  . Heart disease Father     myocardial infarction  . Arthritis/Rheumatoid Mother   . Diabetes Sister   . Epilepsy Sister   . Breast cancer Neg Hx   . Colon cancer Neg Hx    Social History   Social History  . Marital Status: Widowed    Spouse Name: N/A  . Number of Children: 2  . Years of Education: N/A   Social History Main Topics   . Smoking status: Never Smoker   . Smokeless tobacco: Never Used  . Alcohol Use: No  . Drug Use: No  . Sexual Activity: Not Asked   Other Topics Concern  . None   Social History Narrative    Outpatient Encounter Prescriptions as of 03/26/2016  Medication Sig  . albuterol (PROVENTIL HFA;VENTOLIN HFA) 108 (90 BASE) MCG/ACT inhaler Inhale 2 puffs into the lungs every 6 (six) hours as needed for wheezing or shortness of breath.  Marland Kitchen amitriptyline (ELAVIL) 10 MG tablet TAKE 2 TABLETS (20 MG TOTAL) BY MOUTH AT BEDTIME.  Marland Kitchen aspirin 325 MG tablet Take 325 mg by mouth daily.  Marland Kitchen atorvastatin (LIPITOR) 40 MG tablet TAKE 1 TABLET (40 MG TOTAL) BY MOUTH DAILY.  Marland Kitchen betamethasone valerate ointment (VALISONE) 0.1 % Apply 1 application topically 2 (two) times daily. Do not use for more than 2 weeks consecutively.  . bimatoprost (LUMIGAN) 0.03 % ophthalmic solution Place 1 drop into both eyes at bedtime.  . brinzolamide (AZOPT) 1 % ophthalmic suspension Place 1 drop into both eyes 2 (two) times daily.   . Coenzyme Q10 (CO Q10)  100 MG CAPS Take 1 capsule by mouth daily.  . cyanocobalamin 2000 MCG tablet Take 2,000 mcg by mouth daily.  . fluticasone (FLONASE) 50 MCG/ACT nasal spray PLACE 2 SPRAYS INTO THE NOSE DAILY.  . fluticasone (FLOVENT HFA) 110 MCG/ACT inhaler Inhale 2 puffs into the lungs 2 (two) times daily.  . irbesartan (AVAPRO) 150 MG tablet TAKE 1 TABLET BY MOUTH EVERY DAY  . levothyroxine (SYNTHROID, LEVOTHROID) 100 MCG tablet TAKE 1 TABLET (100 MCG TOTAL) BY MOUTH DAILY BEFORE BREAKFAST.  Marland Kitchen losartan (COZAAR) 50 MG tablet Take 150 mg by mouth daily.  Marland Kitchen LUMIGAN 0.01 % SOLN   . metFORMIN (GLUCOPHAGE) 500 MG tablet Take 1 tablet (500 mg total) by mouth daily.  . mirabegron ER (MYRBETRIQ) 25 MG TB24 tablet Take 25 mg by mouth daily.  Marland Kitchen NAMENDA 10 MG tablet TAKE 1 TABLET BY MOUTH TWICE A DAY  . pantoprazole (PROTONIX) 40 MG tablet Take 1 tablet (40 mg total) by mouth daily.  . psyllium (METAMUCIL)  0.52 G capsule Take 0.228 g by mouth 2 (two) times daily. 4 capsules bid  . ranitidine (ZANTAC) 300 MG tablet Take 1 tablet (300 mg total) by mouth daily.  . sertraline (ZOLOFT) 100 MG tablet TAKE 1 TABLET (100 MG TOTAL) BY MOUTH DAILY.  . SYNTHROID 112 MCG tablet TAKE 1 TABLET (112 MCG TOTAL) BY MOUTH DAILY.  Marland Kitchen triamcinolone cream (KENALOG) 0.1 % APPLY TO AFFECTED AREA ON BODY AS NEEDED FOR ITCH  . [DISCONTINUED] dexlansoprazole (DEXILANT) 60 MG capsule Take 60 mg by mouth daily.  . [DISCONTINUED] chlorpheniramine-HYDROcodone (TUSSIONEX PENNKINETIC ER) 10-8 MG/5ML SUER Take 5 mLs by mouth every 12 (twelve) hours as needed. (Patient not taking: Reported on 03/26/2016)  . [DISCONTINUED] predniSONE (DELTASONE) 50 MG tablet 1 tablet daily x 5 days. (Patient not taking: Reported on 03/26/2016)   No facility-administered encounter medications on file as of 03/26/2016.    Review of Systems  Constitutional: Negative for appetite change and unexpected weight change.  HENT: Negative for congestion and sinus pressure.   Respiratory: Negative for cough, chest tightness and shortness of breath.   Cardiovascular: Negative for chest pain, palpitations and leg swelling.  Gastrointestinal: Positive for abdominal pain (right lower quadrant pain. ) and diarrhea. Negative for nausea and vomiting.  Genitourinary: Negative for dysuria and difficulty urinating.  Musculoskeletal: Negative for back pain and joint swelling.  Skin: Negative for color change and rash.  Neurological: Negative for dizziness, light-headedness and headaches.  Psychiatric/Behavioral: Negative for dysphoric mood and agitation.       Objective:    Physical Exam  Constitutional: She appears well-developed and well-nourished. No distress.  HENT:  Nose: Nose normal.  Mouth/Throat: Oropharynx is clear and moist.  Neck: Neck supple. No thyromegaly present.  Cardiovascular: Normal rate and regular rhythm.   Pulmonary/Chest: Breath sounds  normal. No respiratory distress. She has no wheezes.  Abdominal: Soft. Bowel sounds are normal.  Increased tenderness to palpation over the right lower quadrant.    Musculoskeletal: She exhibits no edema or tenderness.  Lymphadenopathy:    She has no cervical adenopathy.  Skin: No rash noted. No erythema.  Psychiatric: She has a normal mood and affect. Her behavior is normal.    BP 120/80 mmHg  Pulse 100  Temp(Src) 97.7 F (36.5 C) (Oral)  Resp 18  Ht 5\' 3"  (1.6 m)  Wt 170 lb 4 oz (77.225 kg)  BMI 30.17 kg/m2  SpO2 97% Wt Readings from Last 3 Encounters:  03/26/16 170 lb 4  oz (77.225 kg)  03/17/16 168 lb 9 oz (76.459 kg)  02/18/16 173 lb 8 oz (78.699 kg)     Lab Results  Component Value Date   WBC 10.3 11/18/2015   HGB 13.2 11/18/2015   HCT 40.8 11/18/2015   PLT 236 11/18/2015   GLUCOSE 195* 03/17/2016   CHOL 170 01/21/2016   TRIG 124.0 01/21/2016   HDL 42.80 01/21/2016   LDLDIRECT 183.4 08/31/2013   LDLCALC 103* 01/21/2016   ALT 11 03/17/2016   AST 13 03/17/2016   NA 138 03/17/2016   K 3.6 03/17/2016   CL 104 03/17/2016   CREATININE 0.80 03/17/2016   BUN 7 03/17/2016   CO2 24 03/17/2016   TSH 0.37 01/21/2016   INR 1.1 02/05/2014   HGBA1C 7.5* 01/21/2016   MICROALBUR 2.6* 09/18/2015    Mm Screening Breast Tomo Bilateral  02/28/2016  CLINICAL DATA:  Screening. EXAM: DIGITAL SCREENING BILATERAL MAMMOGRAM WITH 3D TOMO WITH CAD COMPARISON:  Previous exam(s). ACR Breast Density Category a: The breast tissue is almost entirely fatty. FINDINGS: There are no findings suspicious for malignancy. Images were processed with CAD. IMPRESSION: No mammographic evidence of malignancy. A result letter of this screening mammogram will be mailed directly to the patient. RECOMMENDATION: Screening mammogram in one year. (Code:SM-B-01Y) BI-RADS CATEGORY  1: Negative. Electronically Signed   By: Margarette Canada M.D.   On: 02/28/2016 08:39       Assessment & Plan:   Problem List Items  Addressed This Visit    CVA (cerebral vascular accident) ([place])    Followed by neurology.  Has sitters.  Continue daily aspirin.       Diabetes mellitus ([place])    Discussed again with her today.  She has cut down on her sweets.  Will stop metformin to see if this helps with the diarrhea.  Follow sugars.  Last a1c had improved some.        Diarrhea - Primary    Persistent.  Will stop metformin.  Is some better.  Probiotic.  Obtain results of C.Diff.  Follow.  Check CT as outlined.        Relevant Orders   CT Abdomen Pelvis W Contrast   GERD (gastroesophageal reflux disease)    On dexilant.  No acid reflux.        Hypercholesterolemia    On lipitor.  Low cholesterol diet and exercise.  Follow lipid panel and liver function tests.        Hypertension    Blood pressure under good control.  Same medication regimen.  Follow pressures.  Follow metabolic panel.       Right lower quadrant pain    Symptoms and exam as outlined.  Check CT abdomen and pelvis.  Treat diarrhea as outlined.  Follow.        Relevant Orders   CT Abdomen Pelvis W Contrast       Einar Pheasant, MD

## 2016-03-26 NOTE — Progress Notes (Signed)
Pre-visit discussion using our clinic review tool. No additional management support is needed unless otherwise documented below in the visit note.  

## 2016-03-29 ENCOUNTER — Encounter: Payer: Self-pay | Admitting: Internal Medicine

## 2016-03-29 DIAGNOSIS — R1031 Right lower quadrant pain: Secondary | ICD-10-CM | POA: Insufficient documentation

## 2016-03-29 NOTE — Assessment & Plan Note (Signed)
On lipitor.  Low cholesterol diet and exercise.  Follow lipid panel and liver function tests.   

## 2016-03-29 NOTE — Assessment & Plan Note (Signed)
On dexilant.  No acid reflux.

## 2016-03-29 NOTE — Assessment & Plan Note (Signed)
Discussed again with her today.  She has cut down on her sweets.  Will stop metformin to see if this helps with the diarrhea.  Follow sugars.  Last a1c had improved some.

## 2016-03-29 NOTE — Assessment & Plan Note (Signed)
Persistent.  Will stop metformin.  Is some better.  Probiotic.  Obtain results of C.Diff.  Follow.  Check CT as outlined.

## 2016-03-29 NOTE — Assessment & Plan Note (Signed)
Symptoms and exam as outlined.  Check CT abdomen and pelvis.  Treat diarrhea as outlined.  Follow.

## 2016-03-29 NOTE — Assessment & Plan Note (Signed)
Followed by neurology.  Has sitters.  Continue daily aspirin.

## 2016-03-29 NOTE — Assessment & Plan Note (Signed)
Blood pressure under good control.  Same medication regimen.  Follow pressures.  Follow metabolic panel.  

## 2016-04-06 ENCOUNTER — Ambulatory Visit: Admission: RE | Admit: 2016-04-06 | Payer: Medicare Other | Source: Ambulatory Visit

## 2016-04-11 ENCOUNTER — Encounter: Payer: Self-pay | Admitting: *Deleted

## 2016-04-11 ENCOUNTER — Emergency Department: Payer: Medicare Other

## 2016-04-11 ENCOUNTER — Emergency Department
Admission: EM | Admit: 2016-04-11 | Discharge: 2016-04-12 | Disposition: A | Discharge status: Home / Self Care | Payer: Medicare Other | Attending: Emergency Medicine | Admitting: Emergency Medicine

## 2016-04-11 DIAGNOSIS — Z7982 Long term (current) use of aspirin: Secondary | ICD-10-CM | POA: Diagnosis not present

## 2016-04-11 DIAGNOSIS — S199XXA Unspecified injury of neck, initial encounter: Secondary | ICD-10-CM | POA: Diagnosis not present

## 2016-04-11 DIAGNOSIS — Y9389 Activity, other specified: Secondary | ICD-10-CM | POA: Diagnosis not present

## 2016-04-11 DIAGNOSIS — Y999 Unspecified external cause status: Secondary | ICD-10-CM | POA: Insufficient documentation

## 2016-04-11 DIAGNOSIS — J449 Chronic obstructive pulmonary disease, unspecified: Secondary | ICD-10-CM | POA: Diagnosis not present

## 2016-04-11 DIAGNOSIS — E1140 Type 2 diabetes mellitus with diabetic neuropathy, unspecified: Secondary | ICD-10-CM | POA: Diagnosis not present

## 2016-04-11 DIAGNOSIS — F329 Major depressive disorder, single episode, unspecified: Secondary | ICD-10-CM | POA: Diagnosis not present

## 2016-04-11 DIAGNOSIS — S0990XA Unspecified injury of head, initial encounter: Secondary | ICD-10-CM | POA: Diagnosis present

## 2016-04-11 DIAGNOSIS — Z85828 Personal history of other malignant neoplasm of skin: Secondary | ICD-10-CM | POA: Diagnosis not present

## 2016-04-11 DIAGNOSIS — S0083XA Contusion of other part of head, initial encounter: Secondary | ICD-10-CM | POA: Insufficient documentation

## 2016-04-11 DIAGNOSIS — Z7984 Long term (current) use of oral hypoglycemic drugs: Secondary | ICD-10-CM | POA: Insufficient documentation

## 2016-04-11 DIAGNOSIS — E039 Hypothyroidism, unspecified: Secondary | ICD-10-CM | POA: Diagnosis not present

## 2016-04-11 DIAGNOSIS — S0993XA Unspecified injury of face, initial encounter: Secondary | ICD-10-CM | POA: Diagnosis not present

## 2016-04-11 DIAGNOSIS — M1990 Unspecified osteoarthritis, unspecified site: Secondary | ICD-10-CM | POA: Insufficient documentation

## 2016-04-11 DIAGNOSIS — I639 Cerebral infarction, unspecified: Secondary | ICD-10-CM | POA: Diagnosis not present

## 2016-04-11 DIAGNOSIS — W010XXA Fall on same level from slipping, tripping and stumbling without subsequent striking against object, initial encounter: Secondary | ICD-10-CM | POA: Diagnosis not present

## 2016-04-11 DIAGNOSIS — Y929 Unspecified place or not applicable: Secondary | ICD-10-CM | POA: Insufficient documentation

## 2016-04-11 DIAGNOSIS — Z90710 Acquired absence of both cervix and uterus: Secondary | ICD-10-CM | POA: Insufficient documentation

## 2016-04-11 DIAGNOSIS — S51811A Laceration without foreign body of right forearm, initial encounter: Secondary | ICD-10-CM | POA: Diagnosis not present

## 2016-04-11 DIAGNOSIS — Z79899 Other long term (current) drug therapy: Secondary | ICD-10-CM | POA: Diagnosis not present

## 2016-04-11 DIAGNOSIS — I10 Essential (primary) hypertension: Secondary | ICD-10-CM | POA: Diagnosis not present

## 2016-04-11 DIAGNOSIS — R220 Localized swelling, mass and lump, head: Secondary | ICD-10-CM | POA: Diagnosis not present

## 2016-04-11 DIAGNOSIS — E669 Obesity, unspecified: Secondary | ICD-10-CM | POA: Diagnosis not present

## 2016-04-11 DIAGNOSIS — G119 Hereditary ataxia, unspecified: Secondary | ICD-10-CM | POA: Diagnosis not present

## 2016-04-11 DIAGNOSIS — W19XXXA Unspecified fall, initial encounter: Secondary | ICD-10-CM

## 2016-04-11 MED ORDER — ACETAMINOPHEN 325 MG PO TABS
650.0000 mg | ORAL_TABLET | ORAL | Status: AC
  Administered 2016-04-11: 650 mg via ORAL
  Filled 2016-04-11: qty 2

## 2016-04-11 NOTE — ED Notes (Signed)
Quale,MD consulted. MD made aware of presenting complaints and triage assessment. MD with [facility]OR[facility] for: CT head and CT cervical spine without contrast. Orders to be entered and carried by this RN.

## 2016-04-11 NOTE — ED Notes (Signed)
Pt to ED via EMS after being out with caregiver and experienced mechanical fall. Pt with head lac noted to right eye brow. Pt states prescence of HA, denies N/V/D or any other complaints. Upon arrival pt AAOX4, bleeding controlled, NAD noted at this time. Pt does not take blood thinners. No neuro deficits noted.

## 2016-04-11 NOTE — ED Provider Notes (Signed)
[facility] Emergency Department Provider Note  ____________________________________________  Time seen: Approximately 9:26 PM  I have reviewed the triage vital signs and the nursing notes.   HISTORY  Chief Complaint Fall and Head Laceration    HPI Sandra Kaufman is a 82 y.o. female history dementia, hypertension.  Patient was with her caretaker when she was walking outside, they report that she tripped. Patient reports that her feet got caught up underneath her and she tripped falling 4. She struck landing her head on the concrete, and she has a "goose egg" over the right forehead.  She denies any neck pain. There was no loss of consciousness. Her son and caretaker reports she is acting normally, she has not had any changes in her health recently, no recent infections or other concerns. Believe her tetanus is likely up-to-date as she follows regular with her primary.  She does have some dementia, no significant changes.   Past Medical History  Diagnosis Date  . Hypertension   . Hypercholesterolemia   . Hypothyroidism   . COPD (chronic obstructive pulmonary disease) ([place])   . GERD (gastroesophageal reflux disease)     with esophageal stricture requiring dilatation x 2 (12/96 and 10/99)  . Arthritis   . Glaucoma   . Peripheral neuropathy ([place])   . History of chicken pox   . Diverticulosis   . Migraines   . Colon polyps   . Urinary incontinence   . Cancer [facility])     skin    Patient Active Problem List   Diagnosis Date Noted  . Right lower quadrant pain 03/29/2016  . Vomiting 03/17/2016  . Rash 03/17/2016  . Diarrhea 02/18/2016  . Adult hypothyroidism 01/29/2016  . Fall 01/19/2016  . Anemia 10/20/2015  . Headache, migraine 10/10/2015  . HLD (hyperlipidemia) 10/10/2015  . Glaucoma 10/10/2015  . Chronic obstructive pulmonary disease ([place]) 10/10/2015  . Arthritis 10/10/2015  . Hypertrophic toenail 07/23/2015  . Health care maintenance  05/27/2015  . Difficulty in walking 10/04/2014  . Cerebral ataxia ([place]) 10/04/2014  . Unsteady gait 08/22/2014  . Obesity 08/22/2014  . Dysphagia 07/14/2014  . Amnesia 05/24/2014  . B12 deficiency 05/24/2014  . 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 SOB (shortness of breath) 05/06/2014  . CVA (cerebral vascular accident) ([place]) 03/04/2014  . Diverticulosis 04/29/2013  . GERD (gastroesophageal reflux disease) 11/27/2012  . Urinary incontinence 11/27/2012  . Ovarian cyst 11/27/2012  . Depression 11/27/2012  . Neuropathy ([place]) 11/27/2012  . Hypothyroidism 11/15/2012  . Hypercholesterolemia 11/15/2012  . Diabetes mellitus ([place]) 11/15/2012  . Hypertension 11/15/2012    Past Surgical History  Procedure Laterality Date  . Abdominal hysterectomy  1961    ovaries not removed  . Tonsillectomy    . Appendectomy    . Bladder tack  1976  . Cataract extraction  1997    bilateral  . Cholecystectomy    . Interstim implant placement  2011    Current Outpatient Rx  Name  Route  Sig  Dispense  Refill  . albuterol (PROVENTIL HFA;VENTOLIN HFA) 108 (90 BASE) MCG/ACT inhaler   Inhalation   Inhale 2 puffs into the lungs every 6 (six) hours as needed for wheezing or shortness of breath.   1 Inhaler   2   . amitriptyline (ELAVIL) 10 MG tablet      TAKE 2 TABLETS (20 MG TOTAL) BY MOUTH AT BEDTIME.   60 tablet   11   . aspirin 325 MG tablet   Oral  Take 325 mg by mouth daily.         Marland Kitchen atorvastatin (LIPITOR) 40 MG tablet      TAKE 1 TABLET (40 MG TOTAL) BY MOUTH DAILY.   30 tablet   3     Pt needs appt soon.   . betamethasone valerate ointment (VALISONE) 0.1 %   Topical   Apply 1 application topically 2 (two) times daily. Do not use for more than 2 weeks consecutively.   30 g   0   . bimatoprost (LUMIGAN) 0.03 % ophthalmic solution   Both Eyes   Place 1 drop into both eyes at bedtime.         . brinzolamide (AZOPT) 1 % ophthalmic suspension   Both Eyes   Place 1 drop into  both eyes 2 (two) times daily.          . Coenzyme Q10 (CO Q10) 100 MG CAPS   Oral   Take 1 capsule by mouth daily.         . cyanocobalamin 2000 MCG tablet   Oral   Take 2,000 mcg by mouth daily.         . fluticasone (FLONASE) 50 MCG/ACT nasal spray      PLACE 2 SPRAYS INTO THE NOSE DAILY.   16 g   2   . fluticasone (FLOVENT HFA) 110 MCG/ACT inhaler   Inhalation   Inhale 2 puffs into the lungs 2 (two) times daily.   1 Inhaler   2   . irbesartan (AVAPRO) 150 MG tablet      TAKE 1 TABLET BY MOUTH EVERY DAY   90 tablet   1   . levothyroxine (SYNTHROID, LEVOTHROID) 100 MCG tablet      TAKE 1 TABLET (100 MCG TOTAL) BY MOUTH DAILY BEFORE BREAKFAST.   90 tablet   1   . losartan (COZAAR) 50 MG tablet   Oral   Take 150 mg by mouth daily.         Marland Kitchen LUMIGAN 0.01 % SOLN                 Dispense as written.   . metFORMIN (GLUCOPHAGE) 500 MG tablet   Oral   Take 1 tablet (500 mg total) by mouth daily.   30 tablet   2   . mirabegron ER (MYRBETRIQ) 25 MG TB24 tablet   Oral   Take 25 mg by mouth daily.         Marland Kitchen NAMENDA 10 MG tablet      TAKE 1 TABLET BY MOUTH TWICE A DAY   180 tablet   1   . pantoprazole (PROTONIX) 40 MG tablet   Oral   Take 1 tablet (40 mg total) by mouth daily.   30 tablet   11   . psyllium (METAMUCIL) 0.52 G capsule   Oral   Take 0.228 g by mouth 2 (two) times daily. 4 capsules bid         . ranitidine (ZANTAC) 300 MG tablet   Oral   Take 1 tablet (300 mg total) by mouth daily.   30 tablet   5   . sertraline (ZOLOFT) 100 MG tablet      TAKE 1 TABLET (100 MG TOTAL) BY MOUTH DAILY.   30 tablet   11   . SYNTHROID 112 MCG tablet      TAKE 1 TABLET (112 MCG TOTAL) BY MOUTH DAILY.   90 tablet   1  Dispense as written.   . triamcinolone cream (KENALOG) 0.1 %      APPLY TO AFFECTED AREA ON BODY AS NEEDED FOR ITCH      1     Allergies Tramadol  Family History  Problem Relation Age of Onset  . Heart  disease Father     myocardial infarction  . Arthritis/Rheumatoid Mother   . Diabetes Sister   . Epilepsy Sister   . Breast cancer Neg Hx   . Colon cancer Neg Hx     Social History Social History  Substance Use Topics  . Smoking status: Never Smoker   . Smokeless tobacco: Never Used  . Alcohol Use: No    Review of Systems Constitutional: No fever/chills Eyes: No visual changes. ENT: No sore throat. Cardiovascular: Denies chest pain. Respiratory: Denies shortness of breath. Gastrointestinal: No abdominal pain.  No nausea, no vomiting.  No diarrhea.  No constipation. Genitourinary: Negative for dysuria. Musculoskeletal: Negative for back pain. She does report that her right knee feels a little bruised up. Skin: Negative for rash. Neurological: Negative for focal weakness or numbness. He does report a moderate headache over the right front of her head.  10-point ROS otherwise negative.  ____________________________________________   PHYSICAL EXAM:  VITAL SIGNS: ED Triage Vitals  Enc Vitals Group     BP 04/11/16 2124 135/93 mmHg     Pulse Rate 04/11/16 2124 102     Resp 04/11/16 2124 16     Temp 04/11/16 2124 98.2 F (36.8 C)     Temp Source 04/11/16 2124 Oral     SpO2 04/11/16 2124 95 %     Weight 04/11/16 2124 160 lb (72.576 kg)     Height 04/11/16 2124 5\' 4"  (1.626 m)     Head Cir --      Peak Flow --      Pain Score 04/11/16 2125 5     Pain Loc --      Pain Edu? --      Excl. in [place]? --    Constitutional: Alert and orientedTo person, family but not to date. She is oriented to hospital. Well appearing and in no acute distress. Eyes: Conjunctivae are normal. PERRL. EOMI. Head: Atraumatic except for a approximately 2 cm region of hematoma overlying the right frontal bone area no ocular injury.. Minimal bleeding and small abrasion overlying. Nose: No congestion/rhinnorhea. Mouth/Throat: Mucous membranes are moist.  Oropharynx non-erythematous. Neck: No stridor.   No midline cervical tenderness. Cardiovascular: Normal rate, regular rhythm. Grossly normal heart sounds.  Good peripheral circulation. Respiratory: Normal respiratory effort.  No retractions. Lungs CTAB. Gastrointestinal: Soft and nontender. No distention. No abdominal bruits. No CVA tenderness. Musculoskeletal:   Lower Extremities  No edema. Normal DP/PT pulses bilateral with good cap refill.  Normal neuro-motor function lower extremities bilateral.  RIGHT Right lower extremity demonstrates normal strength, good use of all muscles. No edema bruising or contusions of the right hip, right knee, right ankle. Full range of motion of the right lower extremity without pain. No pain on axial loading. No evidence of trauma to face small abrasion overlying the right knee, however the patient ranges the leg well and does not appear to have any pain or discomfort.  LEFT Left lower extremity demonstrates normal strength, good use of all muscles. No edema bruising or contusions of the hip,  knee, ankle. Full range of motion of the left lower extremity without pain. No pain on axial loading. No evidence of trauma.  RIGHT Right upper extremity demonstrates normal strength, good use of all muscles. No edema bruising or contusions of the right shoulder/upper arm, right elbow, right forearm / hand except for a small abrasion overlying the right forearm without decrease in range of motion or any deformity. Full range of motion of the right right upper extremity without pain. No evidence of trauma except as noted. Strong radial pulse. Intact median/ulnar/radial neuro-muscular exam.  LEFT Left upper extremity demonstrates normal strength, good use of all muscles. No edema bruising or contusions of the left shoulder/upper arm, left elbow, left forearm / hand. Full range of motion of the left  upper extremity without pain. No evidence of trauma. Strong radial pulse. Intact median/ulnar/radial neuro-muscular  exam.   Neurologic:  Normal speech and language. No gross focal neurologic deficits are appreciated. No gait instability. Skin:  Skin is warm, dry and intact. No rash noted. Psychiatric: Mood and affect are normal. Speech and behavior are normal.  ____________________________________________   LABS (all labs ordered are listed, but only abnormal results are displayed)  Labs Reviewed - No data to display ____________________________________________  EKG   ____________________________________________  RADIOLOGY   ____________________________________________   PROCEDURES  Procedure(s) performed: Laceration  Critical Care performed: No  LACERATION REPAIR Performed by: Delman Kitten Authorized by: Delman Kitten Consent: Verbal consent obtained. Risks and benefits: risks, benefits and alternatives were discussed Consent given by: patient Patient identity confirmed: provided demographic data Prepped and Draped in normal sterile fashion Wound explored  Laceration Location: Right 4 and  Laceration Length: 0.5 cm  No Foreign Bodies seen or palpated  Anesthesia: local infiltration  Local anesthetic:  Anesthetic total:  Irrigation method: syringe/scrub Amount of cleaning: standard  Skin closure: dermabond   Number of sutures: n/a  Technique: standard  Patient tolerance: Patient tolerated the procedure well with no immediate complications.  ____________________________________________   INITIAL IMPRESSION / ASSESSMENT AND PLAN / ED COURSE  Pertinent labs & imaging results that were available during my care of the patient were reviewed by me and considered in my medical decision making (see chart for details).  Patient had a witnessed fall. Appears to be mechanical in nature with her tripping and falling forward. She does have evidence of head injury and given her advanced age we will obtain CT of the head as well as neck to rule out acute traumatic injury.  She  appears to be in her normal state of health, she does have dementia and some baseline confusion which appears to be unchanged. She does have some small abrasions on the right forearm as well as over the right knee, but no evidence of deformity or bony tenderness.  No signs or symptoms suggest systemic infection, no acute cardiac or pulmonary complaints. Blood sugar checked and was 201 with EMS.  We will obtain CT of the had C-spine and reevaluate. She takes aspirin no other anticoagulant.  ----------------------------------------- 12:10 AM on 04/12/2016 -----------------------------------------  Patient resting comfortable. Alert, no distress. Family at bedside.  Ongoing care assigned to Dr. Beather Arbour. Follow-up CT head and cervical spine, if normal anticipate discharge home with family. ____________________________________________   FINAL CLINICAL IMPRESSION(S) / ED DIAGNOSES  Final diagnoses:  Fall, initial encounter  Traumatic hematoma of forehead, initial encounter      Delman Kitten, MD 04/12/16 0010

## 2016-04-12 NOTE — Discharge Instructions (Signed)
You were seen in the Emergency Department (ED) today for a head injury.  Based on your evaluation, you may have sustained a concussion (or bruise) to your brain.  If you had a CT scan done, it did not show any evidence of serious injury or bleeding.    Symptoms to expect from a concussion include nausea, mild to moderate headache, difficulty concentrating or sleeping, and mild lightheadedness.  These symptoms should improve over the next few days to weeks, but it may take many weeks before you feel back to normal.  Return to the emergency department or follow-up with your primary care doctor if your symptoms are not improving over this time.  Signs of a more serious head injury include vomiting, severe headache, excessive sleepiness or confusion, and weakness or numbness in your face, arms or legs.  Return immediately to the Emergency Department if you experience any of these more concerning symptoms.     Head Injury, Adult You have a head injury. Headaches and throwing up (vomiting) are common after a head injury. It should be easy to wake up from sleeping. Sometimes you must stay in the hospital. Most problems happen within the first 24 hours. Side effects may occur up to 7-10 days after the injury.  WHAT ARE THE TYPES OF HEAD INJURIES? Head injuries can be as minor as a bump. Some head injuries can be more severe. More severe head injuries include:  A jarring injury to the brain (concussion).  A bruise of the brain (contusion). This mean there is bleeding in the brain that can cause swelling.  A cracked skull (skull fracture).  Bleeding in the brain that collects, clots, and forms a bump (hematoma). WHEN SHOULD I GET HELP RIGHT AWAY?   You are confused or sleepy.  You cannot be woken up.  You feel sick to your stomach (nauseous) or keep throwing up (vomiting).  Your dizziness or unsteadiness is getting worse.  You have very bad, lasting headaches that are not helped by medicine. Take  medicines only as told by your doctor.  You cannot use your arms or legs like normal.  You cannot walk.  You notice changes in the black spots in the center of the colored part of your eye (pupil).  You have clear or bloody fluid coming from your nose or ears.  You have trouble seeing. During the next 24 hours after the injury, you must stay with someone who can watch you. This person should get help right away (call 911 in the [place] if you start to shake and are not able to control it (have seizures), you pass out, or you are unable to wake up. HOW CAN I PREVENT A HEAD INJURY IN THE FUTURE?  Wear seat belts.  Wear a helmet while bike riding and playing sports like football.  Stay away from dangerous activities around the house. WHEN CAN I RETURN TO NORMAL ACTIVITIES AND ATHLETICS? See your doctor before doing these activities. You should not do normal activities or play contact sports until 1 week after the following symptoms have stopped:  Headache that does not go away.  Dizziness.  Poor attention.  Confusion.  Memory problems.  Sickness to your stomach or throwing up.  Tiredness.  Fussiness.  Bothered by bright lights or loud noises.  Anxiousness or depression.  Restless sleep. MAKE SURE YOU:   Understand these instructions.  Will watch your condition.  Will get help right away if you are not doing well or get worse.  This information is not intended to replace advice given to you by your health care provider. Make sure you discuss any questions you have with your health care provider.   Document Released: 11/26/2008 Document Revised: 01/04/2015 Document Reviewed: 08/21/2013 Elsevier Interactive Patient Education [employer].

## 2016-04-14 ENCOUNTER — Telehealth: Payer: Self-pay | Admitting: Internal Medicine

## 2016-04-14 ENCOUNTER — Other Ambulatory Visit: Payer: Medicare Other

## 2016-04-14 ENCOUNTER — Telehealth: Payer: Self-pay | Admitting: *Deleted

## 2016-04-14 ENCOUNTER — Ambulatory Visit
Admission: RE | Admit: 2016-04-14 | Discharge: 2016-04-14 | Disposition: A | Discharge status: Home / Self Care | Payer: Medicare Other | Source: Ambulatory Visit | Attending: Internal Medicine | Admitting: Internal Medicine

## 2016-04-14 DIAGNOSIS — R1031 Right lower quadrant pain: Secondary | ICD-10-CM | POA: Insufficient documentation

## 2016-04-14 DIAGNOSIS — K5730 Diverticulosis of large intestine without perforation or abscess without bleeding: Secondary | ICD-10-CM | POA: Insufficient documentation

## 2016-04-14 DIAGNOSIS — M5136 Other intervertebral disc degeneration, lumbar region: Secondary | ICD-10-CM | POA: Insufficient documentation

## 2016-04-14 DIAGNOSIS — R197 Diarrhea, unspecified: Secondary | ICD-10-CM | POA: Insufficient documentation

## 2016-04-14 DIAGNOSIS — M4856XA Collapsed vertebra, not elsewhere classified, lumbar region, initial encounter for fracture: Secondary | ICD-10-CM | POA: Insufficient documentation

## 2016-04-14 DIAGNOSIS — R918 Other nonspecific abnormal finding of lung field: Secondary | ICD-10-CM | POA: Insufficient documentation

## 2016-04-14 DIAGNOSIS — Z90710 Acquired absence of both cervix and uterus: Secondary | ICD-10-CM | POA: Insufficient documentation

## 2016-04-14 DIAGNOSIS — M4186 Other forms of scoliosis, lumbar region: Secondary | ICD-10-CM | POA: Insufficient documentation

## 2016-04-14 DIAGNOSIS — K449 Diaphragmatic hernia without obstruction or gangrene: Secondary | ICD-10-CM | POA: Insufficient documentation

## 2016-04-14 DIAGNOSIS — H401132 Primary open-angle glaucoma, bilateral, moderate stage: Secondary | ICD-10-CM | POA: Diagnosis not present

## 2016-04-14 MED ORDER — IOPAMIDOL (ISOVUE-300) INJECTION 61%
100.0000 mL | Freq: Once | INTRAVENOUS | Status: AC | PRN
  Administered 2016-04-14: 100 mL via INTRAVENOUS

## 2016-04-14 NOTE — Telephone Encounter (Signed)
Pt had a fall and was seen in the ER on 4/15. Pt needs follow up appt. Please advise the schedulers where to add pt for follow up appt/msn

## 2016-04-14 NOTE — Telephone Encounter (Signed)
I can see her next Wednesday 04/22/16 - 1:00.  Let me know if problems.

## 2016-04-14 NOTE — Telephone Encounter (Signed)
Shawn from CT scan stated that PT has refused testing, however he can do some images with out the contrast. Please call  [phone]

## 2016-04-14 NOTE — Telephone Encounter (Signed)
Called and spoke to pt.  She is agreeable to try to drink as much as she can after our discussion.

## 2016-04-14 NOTE — Telephone Encounter (Signed)
Called to clarify with the tech, the patient is refusing to drink the oral contrast, please advise if you still want imaging? Thanks

## 2016-04-15 ENCOUNTER — Inpatient Hospital Stay
Admission: EM | Admit: 2016-04-15 | Discharge: 2016-04-23 | DRG: 470 | Disposition: A | Discharge status: Skilled Nursing Facility | Payer: Medicare Other | Attending: Internal Medicine | Admitting: Internal Medicine

## 2016-04-15 ENCOUNTER — Emergency Department: Payer: Medicare Other

## 2016-04-15 DIAGNOSIS — I10 Essential (primary) hypertension: Secondary | ICD-10-CM | POA: Diagnosis present

## 2016-04-15 DIAGNOSIS — D649 Anemia, unspecified: Secondary | ICD-10-CM | POA: Diagnosis present

## 2016-04-15 DIAGNOSIS — W010XXA Fall on same level from slipping, tripping and stumbling without subsequent striking against object, initial encounter: Secondary | ICD-10-CM | POA: Diagnosis present

## 2016-04-15 DIAGNOSIS — N390 Urinary tract infection, site not specified: Secondary | ICD-10-CM | POA: Diagnosis present

## 2016-04-15 DIAGNOSIS — Z820 Family history of epilepsy and other diseases of the nervous system: Secondary | ICD-10-CM

## 2016-04-15 DIAGNOSIS — E7800 Pure hypercholesterolemia, unspecified: Secondary | ICD-10-CM | POA: Diagnosis present

## 2016-04-15 DIAGNOSIS — E119 Type 2 diabetes mellitus without complications: Secondary | ICD-10-CM | POA: Diagnosis present

## 2016-04-15 DIAGNOSIS — H409 Unspecified glaucoma: Secondary | ICD-10-CM | POA: Diagnosis present

## 2016-04-15 DIAGNOSIS — Z7984 Long term (current) use of oral hypoglycemic drugs: Secondary | ICD-10-CM

## 2016-04-15 DIAGNOSIS — F329 Major depressive disorder, single episode, unspecified: Secondary | ICD-10-CM | POA: Diagnosis present

## 2016-04-15 DIAGNOSIS — R1310 Dysphagia, unspecified: Secondary | ICD-10-CM

## 2016-04-15 DIAGNOSIS — R4182 Altered mental status, unspecified: Secondary | ICD-10-CM

## 2016-04-15 DIAGNOSIS — F0390 Unspecified dementia without behavioral disturbance: Secondary | ICD-10-CM | POA: Diagnosis not present

## 2016-04-15 DIAGNOSIS — R0902 Hypoxemia: Secondary | ICD-10-CM

## 2016-04-15 DIAGNOSIS — K449 Diaphragmatic hernia without obstruction or gangrene: Secondary | ICD-10-CM | POA: Diagnosis present

## 2016-04-15 DIAGNOSIS — K228 Other specified diseases of esophagus: Secondary | ICD-10-CM | POA: Diagnosis present

## 2016-04-15 DIAGNOSIS — S72012A Unspecified intracapsular fracture of left femur, initial encounter for closed fracture: Secondary | ICD-10-CM | POA: Diagnosis not present

## 2016-04-15 DIAGNOSIS — Z96649 Presence of unspecified artificial hip joint: Secondary | ICD-10-CM

## 2016-04-15 DIAGNOSIS — S72002A Fracture of unspecified part of neck of left femur, initial encounter for closed fracture: Secondary | ICD-10-CM | POA: Diagnosis not present

## 2016-04-15 DIAGNOSIS — Z7982 Long term (current) use of aspirin: Secondary | ICD-10-CM

## 2016-04-15 DIAGNOSIS — S299XXA Unspecified injury of thorax, initial encounter: Secondary | ICD-10-CM | POA: Diagnosis not present

## 2016-04-15 DIAGNOSIS — Z833 Family history of diabetes mellitus: Secondary | ICD-10-CM

## 2016-04-15 DIAGNOSIS — W19XXXA Unspecified fall, initial encounter: Secondary | ICD-10-CM | POA: Diagnosis not present

## 2016-04-15 DIAGNOSIS — J449 Chronic obstructive pulmonary disease, unspecified: Secondary | ICD-10-CM | POA: Diagnosis present

## 2016-04-15 DIAGNOSIS — M25552 Pain in left hip: Secondary | ICD-10-CM | POA: Diagnosis not present

## 2016-04-15 DIAGNOSIS — E1169 Type 2 diabetes mellitus with other specified complication: Secondary | ICD-10-CM

## 2016-04-15 DIAGNOSIS — F05 Delirium due to known physiological condition: Secondary | ICD-10-CM | POA: Diagnosis not present

## 2016-04-15 DIAGNOSIS — Z79899 Other long term (current) drug therapy: Secondary | ICD-10-CM

## 2016-04-15 DIAGNOSIS — K219 Gastro-esophageal reflux disease without esophagitis: Secondary | ICD-10-CM | POA: Diagnosis present

## 2016-04-15 DIAGNOSIS — E039 Hypothyroidism, unspecified: Secondary | ICD-10-CM | POA: Diagnosis present

## 2016-04-15 DIAGNOSIS — Z8249 Family history of ischemic heart disease and other diseases of the circulatory system: Secondary | ICD-10-CM

## 2016-04-15 DIAGNOSIS — Z01818 Encounter for other preprocedural examination: Secondary | ICD-10-CM | POA: Diagnosis not present

## 2016-04-15 DIAGNOSIS — Y92000 Kitchen of unspecified non-institutional (private) residence as  the place of occurrence of the external cause: Secondary | ICD-10-CM

## 2016-04-15 LAB — BASIC METABOLIC PANEL
Anion gap: 9 (ref 5–15)
BUN: 15 mg/dL (ref 6–20)
CO2: 24 mmol/L (ref 22–32)
Calcium: 8.8 mg/dL — ABNORMAL LOW (ref 8.9–10.3)
Chloride: 108 mmol/L (ref 101–111)
Creatinine, Ser: 0.71 mg/dL (ref 0.44–1.00)
GFR calc Af Amer: >60 mL/min (ref >60)
GFR calc non Af Amer: >60 mL/min (ref >60)
Glucose, Bld: 159 mg/dL — ABNORMAL HIGH (ref 65–99)
Potassium: 3.2 mmol/L — ABNORMAL LOW (ref 3.5–5.1)
Sodium: 141 mmol/L (ref 135–145)

## 2016-04-15 LAB — CBC WITH DIFFERENTIAL/PLATELET
Basophils Absolute: 0.1 10*3/uL (ref 0–0.1)
Basophils Relative: 1 %
Eosinophils Absolute: 0.1 10*3/uL (ref 0–0.7)
Eosinophils Relative: 1 %
HCT: 36.5 % (ref 35.0–47.0)
Hemoglobin: 12.7 g/dL (ref 12.0–16.0)
Lymphocytes Relative: 11 %
Lymphs Abs: 1 10*3/uL (ref 1.0–3.6)
MCH: 29.4 pg (ref 26.0–34.0)
MCHC: 34.6 g/dL (ref 32.0–36.0)
MCV: 85 fL (ref 80.0–100.0)
Monocytes Absolute: 0.5 10*3/uL (ref 0.2–0.9)
Monocytes Relative: 5 %
Neutro Abs: 7.9 10*3/uL — ABNORMAL HIGH (ref 1.4–6.5)
Neutrophils Relative %: 82 %
Platelets: 195 10*3/uL (ref 150–440)
RBC: 4.3 MIL/uL (ref 3.80–5.20)
RDW: 14.4 % (ref 11.5–14.5)
WBC: 9.6 10*3/uL (ref 3.6–11.0)

## 2016-04-15 MED ORDER — CEFAZOLIN SODIUM-DEXTROSE 2-4 GM/100ML-% IV SOLN
2.0000 g | INTRAVENOUS | Status: AC
  Administered 2016-04-16: 2 g via INTRAVENOUS
  Filled 2016-04-15: qty 100

## 2016-04-15 MED ORDER — FENTANYL CITRATE (PF) 100 MCG/2ML IJ SOLN
INTRAMUSCULAR | Status: AC
  Administered 2016-04-15: 50 ug via INTRAVENOUS
  Filled 2016-04-15: qty 2

## 2016-04-15 MED ORDER — FENTANYL CITRATE (PF) 100 MCG/2ML IJ SOLN
50.0000 ug | Freq: Once | INTRAMUSCULAR | Status: AC
  Administered 2016-04-15: 50 ug via INTRAVENOUS

## 2016-04-15 NOTE — ED Notes (Signed)
Pt bib EMS w/ c/o mechanical fall.  Per EMS, pt denied dizziness or LOC on scene. Pt given 100 mcg fentynl.   Pt unable to give details of fall during triage.  Pt does have old bruising to face r/t previous fall.   Shortening/rotation noted to L leg.  Pt c/o L hip pain.  A/Ox4. NAD.

## 2016-04-15 NOTE — ED Provider Notes (Signed)
[facility] Emergency Department Provider Note ____________________________________________  Time seen: Approximately 9:20 PM  I have reviewed the triage vital signs and the nursing notes.   HISTORY  Chief Complaint Fall   HPI Sandra Kaufman is a 82 y.o. female who presents to the ER via EMS after she fell at home while trying to put on her slippers lost her balance falling on her rear on the tile. She was noted by EMS to have shortening and external rotation of her left leg. Pain worse with movement. She was just in the ER 2 days ago and fell, striking her head. She had a negative CT head & c-spine at that time. She has not had any recent illness and had been recovering from her fall well until today.  Past Medical History  Diagnosis Date  . Hypertension   . Hypercholesterolemia   . Hypothyroidism   . COPD (chronic obstructive pulmonary disease) ([place])   . GERD (gastroesophageal reflux disease)     with esophageal stricture requiring dilatation x 2 (12/96 and 10/99)  . Arthritis   . Glaucoma   . Peripheral neuropathy ([place])   . History of chicken pox   . Diverticulosis   . Migraines   . Colon polyps   . Urinary incontinence   . Cancer [facility])     skin    Patient Active Problem List   Diagnosis Date Noted  . Right lower quadrant pain 03/29/2016  . Vomiting 03/17/2016  . Rash 03/17/2016  . Diarrhea 02/18/2016  . Adult hypothyroidism 01/29/2016  . Fall 01/19/2016  . Anemia 10/20/2015  . Headache, migraine 10/10/2015  . HLD (hyperlipidemia) 10/10/2015  . Glaucoma 10/10/2015  . Chronic obstructive pulmonary disease ([place]) 10/10/2015  . Arthritis 10/10/2015  . Hypertrophic toenail 07/23/2015  . Health care maintenance 05/27/2015  . Difficulty in walking 10/04/2014  . Cerebral ataxia ([place]) 10/04/2014  . Unsteady gait 08/22/2014  . Obesity 08/22/2014  . Dysphagia 07/14/2014  . Amnesia 05/24/2014  . B12 deficiency 05/24/2014  . Appendicular ataxia  05/24/2014  . SOB (shortness of breath) 05/06/2014  . CVA (cerebral vascular accident) ([place]) 03/04/2014  . Diverticulosis 04/29/2013  . GERD (gastroesophageal reflux disease) 11/27/2012  . Urinary incontinence 11/27/2012  . Ovarian cyst 11/27/2012  . Depression 11/27/2012  . Neuropathy ([place]) 11/27/2012  . Hypothyroidism 11/15/2012  . Hypercholesterolemia 11/15/2012  . Diabetes mellitus ([place]) 11/15/2012  . Hypertension 11/15/2012    Past Surgical History  Procedure Laterality Date  . Abdominal hysterectomy  1961    ovaries not removed  . Tonsillectomy    . Appendectomy    . Bladder tack  1976  . Cataract extraction  1997    bilateral  . Cholecystectomy    . Interstim implant placement  2011    Current Outpatient Rx  Name  Route  Sig  Dispense  Refill  . albuterol (PROVENTIL HFA;VENTOLIN HFA) 108 (90 BASE) MCG/ACT inhaler   Inhalation   Inhale 2 puffs into the lungs every 6 (six) hours as needed for wheezing or shortness of breath.   1 Inhaler   2   . amitriptyline (ELAVIL) 10 MG tablet   Oral   Take 20 mg by mouth at bedtime.         Marland Kitchen aspirin 325 MG tablet   Oral   Take 325 mg by mouth daily.         Marland Kitchen atorvastatin (LIPITOR) 40 MG tablet   Oral   Take 40 mg by mouth  at bedtime.         . betamethasone valerate ointment (VALISONE) 0.1 %   Topical   Apply 1 application topically 2 (two) times daily. Do not use for more than 2 weeks consecutively.   30 g   0   . bimatoprost (LUMIGAN) 0.01 % SOLN   Both Eyes   Place 1 drop into both eyes at bedtime.         . brinzolamide (AZOPT) 1 % ophthalmic suspension   Both Eyes   Place 1 drop into both eyes 2 (two) times daily.          . Coenzyme Q10 (CO Q10) 100 MG CAPS   Oral   Take 100 mg by mouth daily.          . fluticasone (FLONASE) 50 MCG/ACT nasal spray   Each Nare   Place 2 sprays into both nostrils daily.         Marland Kitchen levothyroxine (SYNTHROID, LEVOTHROID) 100 MCG tablet   Oral   Take 100  mcg by mouth daily before breakfast.         . memantine (NAMENDA) 10 MG tablet   Oral   Take 10 mg by mouth 2 (two) times daily.         . metFORMIN (GLUCOPHAGE) 500 MG tablet   Oral   Take 1 tablet (500 mg total) by mouth daily.   30 tablet   2   . pantoprazole (PROTONIX) 40 MG tablet   Oral   Take 1 tablet (40 mg total) by mouth daily.   30 tablet   11   . psyllium (METAMUCIL) 0.52 G capsule   Oral   Take 2.08 g by mouth 2 (two) times daily.          . sertraline (ZOLOFT) 100 MG tablet   Oral   Take 100 mg by mouth daily.         Marland Kitchen triamcinolone cream (KENALOG) 0.1 %   Topical   Apply 1 application topically 2 (two) times daily as needed (for itching).         . vitamin B-12 (CYANOCOBALAMIN) 1000 MCG tablet   Oral   Take 2,000 mcg by mouth daily.           Allergies Tramadol  Family History  Problem Relation Age of Onset  . Heart disease Father     myocardial infarction  . Arthritis/Rheumatoid Mother   . Diabetes Sister   . Epilepsy Sister   . Breast cancer Neg Hx   . Colon cancer Neg Hx     Social History Social History  Substance Use Topics  . Smoking status: Never Smoker   . Smokeless tobacco: Never Used  . Alcohol Use: No    Review of Systems Constitutional: No fever Eyes: No visual changes. ENT: No sore throat. Cardiovascular: Denies chest pain. Respiratory: Denies shortness of breath. Gastrointestinal: No abdominal pain.  Musculoskeletal: Negative for back pain. 10-point ROS otherwise negative.  ____________________________________________   PHYSICAL EXAM:  VITAL SIGNS: ED Triage Vitals  Enc Vitals Group     BP 04/15/16 1958 169/94 mmHg     Pulse Rate 04/15/16 1958 87     Resp 04/15/16 1958 16     Temp 04/15/16 1958 97.8 F (36.6 C)     Temp Source 04/15/16 1958 Oral     SpO2 04/15/16 1958 93 %     Weight --      Height --  Head Cir --      Peak Flow --      Pain Score 04/15/16 1959 2     Pain Loc --       Pain Edu? --      Excl. in [place]? --    Constitutional: Alert and oriented. Well appearing and in no acute distress. Eyes: Conjunctivae are normal. PERRL. EOMI. Head: Ecchymosis bilateral eyes right greater than left; right wound over eye clean dry and intact Nose: No congestion/rhinnorhea. Mouth/Throat: Mucous membranes are moist.  Oropharynx non-erythematous. Neck: No stridor.  No C-spine tenderness Cardiovascular: Normal rate, regular rhythm. Grossly normal heart sounds.  Good peripheral circulation. Respiratory: Normal respiratory effort.  No retractions. Lungs CTAB. Gastrointestinal: Soft and nontender. No distention. No abdominal bruits. No CVA tenderness. Musculoskeletal: Left lower extremity shortened and externally rotated; dorsalis pedis pulses 2+ bilaterally  Neurologic:  Normal speech and language. No gross focal neurologic deficits are appreciated.  Skin:  Skin is warm, dry and intact. No rash noted. Psychiatric: Mood and affect are normal. Speech and behavior are normal.  ____________________________________________   LABS (all labs ordered are listed, but only abnormal results are displayed)  Labs Reviewed  CBC WITH DIFFERENTIAL/PLATELET - Abnormal; Notable for the following:    Neutro Abs 7.9 (*)    All other components within normal limits  BASIC METABOLIC PANEL - Abnormal; Notable for the following:    Potassium 3.2 (*)    Glucose, Bld 159 (*)    Calcium 8.8 (*)    All other components within normal limits   ____________________________________________  EKG  ED ECG REPORT I, Ponciano Ort, the attending physician, personally viewed and interpreted this ECG.   Date: 04/15/2016  EKG Time: 2228  Rate: 86  Rhythm: nsr  Axis: nl  Intervals:nl  ST&T Change: nl  ____________________________________________  RADIOLOGY  cxr- R hip xr-There is a new subcapital fracture of the left hip which appears foreshortened approximately 10 mm, with mild varus  angulation (approximately 10 degrees). Femoral head remains located. Bony pelvis and visualized portions of the right proximal femur appear intact. Residual oral contrast material was noted in the colon and rectum. Sacral nerve stimulator noted projecting over the left buttock region with lead tip terminating in the region of the left hemiMPRESSION:  1. New left femoral neck subcapital fracture, as above.   Electronically Signed By: Vinnie Langton M.D. On: 04/15/2016 20:38 ____________________________________________   INITIAL IMPRESSION / ASSESSMENT AND PLAN / ED COURSE  Pertinent labs & imaging results that were available during my care of the patient were reviewed by me and considered in my medical decision making (see chart for details).  I discussed case with Dr. Roland Rack, orthopedics who will likely operate tomorrow.  Prime doc aware. ____________________________________________    FINAL CLINICAL IMPRESSION(S) / ED DIAGNOSES  Final diagnoses:  Hip fracture requiring operative repair, left, closed, initial encounter [facility])     New prescriptions started this visit New Prescriptions   No medications on file      Ponciano Ort, MD 04/15/16 2247

## 2016-04-16 ENCOUNTER — Inpatient Hospital Stay: Payer: Medicare Other | Admitting: Anesthesiology

## 2016-04-16 ENCOUNTER — Encounter
Admission: EM | Disposition: A | Discharge status: Skilled Nursing Facility | Payer: Self-pay | Source: Home / Self Care | Attending: Internal Medicine

## 2016-04-16 ENCOUNTER — Encounter: Payer: Self-pay | Admitting: *Deleted

## 2016-04-16 ENCOUNTER — Inpatient Hospital Stay: Payer: Medicare Other

## 2016-04-16 DIAGNOSIS — Y92000 Kitchen of unspecified non-institutional (private) residence as  the place of occurrence of the external cause: Secondary | ICD-10-CM | POA: Diagnosis not present

## 2016-04-16 DIAGNOSIS — Z7984 Long term (current) use of oral hypoglycemic drugs: Secondary | ICD-10-CM | POA: Diagnosis not present

## 2016-04-16 DIAGNOSIS — R410 Disorientation, unspecified: Secondary | ICD-10-CM | POA: Diagnosis not present

## 2016-04-16 DIAGNOSIS — Z820 Family history of epilepsy and other diseases of the nervous system: Secondary | ICD-10-CM | POA: Diagnosis not present

## 2016-04-16 DIAGNOSIS — R262 Difficulty in walking, not elsewhere classified: Secondary | ICD-10-CM | POA: Diagnosis not present

## 2016-04-16 DIAGNOSIS — I10 Essential (primary) hypertension: Secondary | ICD-10-CM | POA: Diagnosis present

## 2016-04-16 DIAGNOSIS — Z471 Aftercare following joint replacement surgery: Secondary | ICD-10-CM | POA: Diagnosis not present

## 2016-04-16 DIAGNOSIS — D649 Anemia, unspecified: Secondary | ICD-10-CM | POA: Diagnosis present

## 2016-04-16 DIAGNOSIS — Z7982 Long term (current) use of aspirin: Secondary | ICD-10-CM | POA: Diagnosis not present

## 2016-04-16 DIAGNOSIS — S72002A Fracture of unspecified part of neck of left femur, initial encounter for closed fracture: Secondary | ICD-10-CM | POA: Diagnosis present

## 2016-04-16 DIAGNOSIS — E639 Nutritional deficiency, unspecified: Secondary | ICD-10-CM | POA: Diagnosis not present

## 2016-04-16 DIAGNOSIS — K228 Other specified diseases of esophagus: Secondary | ICD-10-CM | POA: Diagnosis present

## 2016-04-16 DIAGNOSIS — R0902 Hypoxemia: Secondary | ICD-10-CM | POA: Diagnosis not present

## 2016-04-16 DIAGNOSIS — F329 Major depressive disorder, single episode, unspecified: Secondary | ICD-10-CM | POA: Diagnosis present

## 2016-04-16 DIAGNOSIS — R41841 Cognitive communication deficit: Secondary | ICD-10-CM | POA: Diagnosis not present

## 2016-04-16 DIAGNOSIS — Z96642 Presence of left artificial hip joint: Secondary | ICD-10-CM | POA: Diagnosis not present

## 2016-04-16 DIAGNOSIS — N390 Urinary tract infection, site not specified: Secondary | ICD-10-CM | POA: Diagnosis present

## 2016-04-16 DIAGNOSIS — H409 Unspecified glaucoma: Secondary | ICD-10-CM | POA: Diagnosis present

## 2016-04-16 DIAGNOSIS — Z833 Family history of diabetes mellitus: Secondary | ICD-10-CM | POA: Diagnosis not present

## 2016-04-16 DIAGNOSIS — K449 Diaphragmatic hernia without obstruction or gangrene: Secondary | ICD-10-CM | POA: Diagnosis present

## 2016-04-16 DIAGNOSIS — K219 Gastro-esophageal reflux disease without esophagitis: Secondary | ICD-10-CM | POA: Diagnosis present

## 2016-04-16 DIAGNOSIS — E119 Type 2 diabetes mellitus without complications: Secondary | ICD-10-CM | POA: Diagnosis present

## 2016-04-16 DIAGNOSIS — E7800 Pure hypercholesterolemia, unspecified: Secondary | ICD-10-CM | POA: Diagnosis present

## 2016-04-16 DIAGNOSIS — F0390 Unspecified dementia without behavioral disturbance: Secondary | ICD-10-CM | POA: Diagnosis present

## 2016-04-16 DIAGNOSIS — R1310 Dysphagia, unspecified: Secondary | ICD-10-CM | POA: Diagnosis not present

## 2016-04-16 DIAGNOSIS — Z9181 History of falling: Secondary | ICD-10-CM | POA: Diagnosis not present

## 2016-04-16 DIAGNOSIS — W010XXA Fall on same level from slipping, tripping and stumbling without subsequent striking against object, initial encounter: Secondary | ICD-10-CM | POA: Diagnosis present

## 2016-04-16 DIAGNOSIS — E1140 Type 2 diabetes mellitus with diabetic neuropathy, unspecified: Secondary | ICD-10-CM | POA: Diagnosis not present

## 2016-04-16 DIAGNOSIS — S72012A Unspecified intracapsular fracture of left femur, initial encounter for closed fracture: Secondary | ICD-10-CM | POA: Diagnosis present

## 2016-04-16 DIAGNOSIS — E1139 Type 2 diabetes mellitus with other diabetic ophthalmic complication: Secondary | ICD-10-CM | POA: Diagnosis not present

## 2016-04-16 DIAGNOSIS — Z79899 Other long term (current) drug therapy: Secondary | ICD-10-CM | POA: Diagnosis not present

## 2016-04-16 DIAGNOSIS — M84459A Pathological fracture, hip, unspecified, initial encounter for fracture: Secondary | ICD-10-CM | POA: Diagnosis not present

## 2016-04-16 DIAGNOSIS — R1311 Dysphagia, oral phase: Secondary | ICD-10-CM | POA: Diagnosis not present

## 2016-04-16 DIAGNOSIS — F0391 Unspecified dementia with behavioral disturbance: Secondary | ICD-10-CM | POA: Diagnosis not present

## 2016-04-16 DIAGNOSIS — F05 Delirium due to known physiological condition: Secondary | ICD-10-CM | POA: Diagnosis not present

## 2016-04-16 DIAGNOSIS — Z8249 Family history of ischemic heart disease and other diseases of the circulatory system: Secondary | ICD-10-CM | POA: Diagnosis not present

## 2016-04-16 DIAGNOSIS — J449 Chronic obstructive pulmonary disease, unspecified: Secondary | ICD-10-CM | POA: Diagnosis present

## 2016-04-16 DIAGNOSIS — S72002D Fracture of unspecified part of neck of left femur, subsequent encounter for closed fracture with routine healing: Secondary | ICD-10-CM | POA: Diagnosis not present

## 2016-04-16 DIAGNOSIS — M6281 Muscle weakness (generalized): Secondary | ICD-10-CM | POA: Diagnosis not present

## 2016-04-16 DIAGNOSIS — R4182 Altered mental status, unspecified: Secondary | ICD-10-CM | POA: Diagnosis not present

## 2016-04-16 DIAGNOSIS — E039 Hypothyroidism, unspecified: Secondary | ICD-10-CM | POA: Diagnosis present

## 2016-04-16 HISTORY — PX: HIP ARTHROPLASTY: SHX981

## 2016-04-16 LAB — BASIC METABOLIC PANEL
Anion gap: 5 (ref 5–15)
BUN: 13 mg/dL (ref 6–20)
CO2: 27 mmol/L (ref 22–32)
Calcium: 8.5 mg/dL — ABNORMAL LOW (ref 8.9–10.3)
Chloride: 108 mmol/L (ref 101–111)
Creatinine, Ser: 0.66 mg/dL (ref 0.44–1.00)
GFR calc Af Amer: >60 mL/min (ref >60)
GFR calc non Af Amer: >60 mL/min (ref >60)
Glucose, Bld: 181 mg/dL — ABNORMAL HIGH (ref 65–99)
Potassium: 3.2 mmol/L — ABNORMAL LOW (ref 3.5–5.1)
Sodium: 140 mmol/L (ref 135–145)

## 2016-04-16 LAB — URINALYSIS COMPLETE WITH MICROSCOPIC (ARMC ONLY)
Bilirubin Urine: NEGATIVE
Glucose, UA: NEGATIVE mg/dL
Hgb urine dipstick: NEGATIVE
Nitrite: NEGATIVE
Protein, ur: 30 mg/dL — AB
Specific Gravity, Urine: 1.024 (ref 1.005–1.030)
pH: 5 (ref 5.0–8.0)

## 2016-04-16 LAB — GLUCOSE, CAPILLARY
Glucose-Capillary: 171 mg/dL — ABNORMAL HIGH (ref 65–99)
Glucose-Capillary: 156 mg/dL — ABNORMAL HIGH (ref 65–99)
Glucose-Capillary: 167 mg/dL — ABNORMAL HIGH (ref 65–99)
Glucose-Capillary: 177 mg/dL — ABNORMAL HIGH (ref 65–99)

## 2016-04-16 LAB — CBC
HCT: 34.3 % — ABNORMAL LOW (ref 35.0–47.0)
Hemoglobin: 11.8 g/dL — ABNORMAL LOW (ref 12.0–16.0)
MCH: 29.2 pg (ref 26.0–34.0)
MCHC: 34.2 g/dL (ref 32.0–36.0)
MCV: 85.3 fL (ref 80.0–100.0)
Platelets: 178 10*3/uL (ref 150–440)
RBC: 4.02 MIL/uL (ref 3.80–5.20)
RDW: 14.7 % — ABNORMAL HIGH (ref 11.5–14.5)
WBC: 9 10*3/uL (ref 3.6–11.0)

## 2016-04-16 LAB — SURGICAL PCR SCREEN
MRSA, PCR: NEGATIVE
Staphylococcus aureus: POSITIVE — AB

## 2016-04-16 LAB — HEMOGLOBIN A1C: Hgb A1c MFr Bld: 6.8 % — ABNORMAL HIGH (ref 4.0–6.0)

## 2016-04-16 SURGERY — HEMIARTHROPLASTY, HIP, DIRECT ANTERIOR APPROACH, FOR FRACTURE
Anesthesia: General | Laterality: Left | Wound class: Clean

## 2016-04-16 MED ORDER — ONDANSETRON HCL 4 MG/2ML IJ SOLN: 4.0000 mg | Freq: Four times a day (QID) | INTRAMUSCULAR | Status: DC | PRN

## 2016-04-16 MED ORDER — ROCURONIUM BROMIDE 100 MG/10ML IV SOLN
INTRAVENOUS | Status: DC | PRN
  Administered 2016-04-16: 30 mg via INTRAVENOUS

## 2016-04-16 MED ORDER — PANTOPRAZOLE SODIUM 40 MG PO TBEC
40.0000 mg | DELAYED_RELEASE_TABLET | Freq: Every day | ORAL | Status: DC
  Administered 2016-04-17 – 2016-04-19 (×3): 40 mg via ORAL
  Filled 2016-04-16 (×4): qty 1

## 2016-04-16 MED ORDER — ALBUTEROL SULFATE (2.5 MG/3ML) 0.083% IN NEBU: 3.0000 mL | INHALATION_SOLUTION | Freq: Four times a day (QID) | RESPIRATORY_TRACT | Status: DC | PRN

## 2016-04-16 MED ORDER — ENOXAPARIN SODIUM 40 MG/0.4ML ~~LOC~~ SOLN
40.0000 mg | SUBCUTANEOUS | Status: DC
  Administered 2016-04-17 – 2016-04-23 (×7): 40 mg via SUBCUTANEOUS
  Filled 2016-04-16 (×7): qty 0.4

## 2016-04-16 MED ORDER — ONDANSETRON HCL 4 MG PO TABS: 4.0000 mg | ORAL_TABLET | Freq: Four times a day (QID) | ORAL | Status: DC | PRN

## 2016-04-16 MED ORDER — BUPIVACAINE-EPINEPHRINE (PF) 0.25% -1:200000 IJ SOLN
INTRAMUSCULAR | Status: AC
  Filled 2016-04-16: qty 30

## 2016-04-16 MED ORDER — INSULIN ASPART 100 UNIT/ML ~~LOC~~ SOLN
0.0000 [IU] | Freq: Three times a day (TID) | SUBCUTANEOUS | Status: DC
Start: 2016-04-16 — End: 2016-04-23
  Administered 2016-04-17: 3 [IU] via SUBCUTANEOUS
  Administered 2016-04-17 – 2016-04-18 (×2): 2 [IU] via SUBCUTANEOUS
  Administered 2016-04-18: 3 [IU] via SUBCUTANEOUS
  Administered 2016-04-18: 2 [IU] via SUBCUTANEOUS
  Administered 2016-04-19 – 2016-04-20 (×4): 3 [IU] via SUBCUTANEOUS
  Administered 2016-04-21: 5 [IU] via SUBCUTANEOUS
  Administered 2016-04-21: 2 [IU] via SUBCUTANEOUS
  Administered 2016-04-21: 3 [IU] via SUBCUTANEOUS
  Administered 2016-04-22: 2 [IU] via SUBCUTANEOUS
  Administered 2016-04-22: 3 [IU] via SUBCUTANEOUS
  Administered 2016-04-22 – 2016-04-23 (×2): 2 [IU] via SUBCUTANEOUS
  Administered 2016-04-23: 5 [IU] via SUBCUTANEOUS
  Filled 2016-04-16: qty 4
  Filled 2016-04-16 (×4): qty 3
  Filled 2016-04-16: qty 1
  Filled 2016-04-16 (×2): qty 3
  Filled 2016-04-16: qty 2
  Filled 2016-04-16: qty 3
  Filled 2016-04-16 (×2): qty 1
  Filled 2016-04-16: qty 3
  Filled 2016-04-16: qty 2
  Filled 2016-04-16 (×2): qty 5
  Filled 2016-04-16 (×2): qty 3

## 2016-04-16 MED ORDER — MORPHINE SULFATE (PF) 4 MG/ML IV SOLN
4.0000 mg | INTRAVENOUS | Status: DC | PRN
  Administered 2016-04-16 (×2): 4 mg via INTRAVENOUS
  Filled 2016-04-16 (×2): qty 1

## 2016-04-16 MED ORDER — OXYCODONE HCL 5 MG PO TABS: 5.0000 mg | ORAL_TABLET | Freq: Once | ORAL | Status: DC | PRN

## 2016-04-16 MED ORDER — ACETAMINOPHEN 10 MG/ML IV SOLN
INTRAVENOUS | Status: AC
  Filled 2016-04-16: qty 100

## 2016-04-16 MED ORDER — OXYCODONE HCL 5 MG PO TABS
5.0000 mg | ORAL_TABLET | ORAL | Status: DC | PRN
  Administered 2016-04-16 – 2016-04-20 (×8): 5 mg via ORAL
  Filled 2016-04-16 (×8): qty 1

## 2016-04-16 MED ORDER — SODIUM CHLORIDE 0.9 % IV SOLN
INTRAVENOUS | Status: DC
  Administered 2016-04-16: 03:00:00 via INTRAVENOUS

## 2016-04-16 MED ORDER — SODIUM CHLORIDE 0.9 % IV SOLN
INTRAVENOUS | Status: DC | PRN
  Administered 2016-04-16: 60 mL

## 2016-04-16 MED ORDER — ONDANSETRON HCL 4 MG/2ML IJ SOLN
INTRAMUSCULAR | Status: DC | PRN
  Administered 2016-04-16: 4 mg via INTRAVENOUS

## 2016-04-16 MED ORDER — ACETAMINOPHEN 650 MG RE SUPP: 650.0000 mg | Freq: Four times a day (QID) | RECTAL | Status: DC | PRN

## 2016-04-16 MED ORDER — TRANEXAMIC ACID 1000 MG/10ML IV SOLN
INTRAVENOUS | Status: AC
  Filled 2016-04-16: qty 10

## 2016-04-16 MED ORDER — BISACODYL 10 MG RE SUPP: 10.0000 mg | Freq: Every day | RECTAL | Status: DC | PRN

## 2016-04-16 MED ORDER — CEFAZOLIN SODIUM-DEXTROSE 2-4 GM/100ML-% IV SOLN
2.0000 g | Freq: Four times a day (QID) | INTRAVENOUS | Status: AC
  Administered 2016-04-16 – 2016-04-17 (×3): 2 g via INTRAVENOUS
  Filled 2016-04-16 (×4): qty 100

## 2016-04-16 MED ORDER — POTASSIUM CHLORIDE CRYS ER 20 MEQ PO TBCR
20.0000 meq | EXTENDED_RELEASE_TABLET | ORAL | Status: DC
  Administered 2016-04-16: 20 meq via ORAL
  Filled 2016-04-16: qty 1

## 2016-04-16 MED ORDER — NEOMYCIN-POLYMYXIN B GU 40-200000 IR SOLN
Status: AC
  Filled 2016-04-16: qty 20

## 2016-04-16 MED ORDER — LATANOPROST 0.005 % OP SOLN
1.0000 [drp] | Freq: Every day | OPHTHALMIC | Status: DC
  Administered 2016-04-16 – 2016-04-22 (×7): 1 [drp] via OPHTHALMIC
  Filled 2016-04-16 (×2): qty 2.5

## 2016-04-16 MED ORDER — ACETAMINOPHEN 325 MG PO TABS: 650.0000 mg | ORAL_TABLET | Freq: Four times a day (QID) | ORAL | Status: DC | PRN

## 2016-04-16 MED ORDER — METOCLOPRAMIDE HCL 5 MG/ML IJ SOLN: 5.0000 mg | Freq: Three times a day (TID) | INTRAMUSCULAR | Status: DC | PRN

## 2016-04-16 MED ORDER — DIPHENHYDRAMINE HCL 12.5 MG/5ML PO ELIX: 12.5000 mg | ORAL_SOLUTION | ORAL | Status: DC | PRN

## 2016-04-16 MED ORDER — LIDOCAINE HCL (CARDIAC) 20 MG/ML IV SOLN
INTRAVENOUS | Status: DC | PRN
  Administered 2016-04-16: 100 mg via INTRAVENOUS

## 2016-04-16 MED ORDER — FENTANYL CITRATE (PF) 100 MCG/2ML IJ SOLN
25.0000 ug | INTRAMUSCULAR | Status: DC | PRN
  Administered 2016-04-16 (×3): 25 ug via INTRAVENOUS

## 2016-04-16 MED ORDER — BUPIVACAINE LIPOSOME 1.3 % IJ SUSP: INTRAMUSCULAR | Status: DC | PRN

## 2016-04-16 MED ORDER — BUPIVACAINE LIPOSOME 1.3 % IJ SUSP
INTRAMUSCULAR | Status: AC
Start: 2016-04-16 — End: 2016-04-16
  Filled 2016-04-16: qty 20

## 2016-04-16 MED ORDER — FLEET ENEMA 7-19 GM/118ML RE ENEM: 1.0000 | ENEMA | Freq: Once | RECTAL | Status: DC | PRN

## 2016-04-16 MED ORDER — SODIUM CHLORIDE 0.9% FLUSH
3.0000 mL | Freq: Two times a day (BID) | INTRAVENOUS | Status: DC
  Administered 2016-04-16 – 2016-04-23 (×12): 3 mL via INTRAVENOUS

## 2016-04-16 MED ORDER — KCL IN DEXTROSE-NACL 20-5-0.9 MEQ/L-%-% IV SOLN
INTRAVENOUS | Status: DC
  Administered 2016-04-16: 19:00:00 via INTRAVENOUS
  Filled 2016-04-16 (×5): qty 1000

## 2016-04-16 MED ORDER — MORPHINE SULFATE (PF) 2 MG/ML IV SOLN
2.0000 mg | Freq: Once | INTRAVENOUS | Status: AC
  Administered 2016-04-16: 2 mg via INTRAVENOUS
  Filled 2016-04-16: qty 1

## 2016-04-16 MED ORDER — SUGAMMADEX SODIUM 200 MG/2ML IV SOLN
INTRAVENOUS | Status: DC | PRN
  Administered 2016-04-16: 150 mg via INTRAVENOUS

## 2016-04-16 MED ORDER — FENTANYL CITRATE (PF) 100 MCG/2ML IJ SOLN
INTRAMUSCULAR | Status: AC
  Filled 2016-04-16: qty 2

## 2016-04-16 MED ORDER — HYDROMORPHONE HCL 1 MG/ML IJ SOLN: 0.5000 mg | INTRAMUSCULAR | Status: DC | PRN

## 2016-04-16 MED ORDER — BUPIVACAINE-EPINEPHRINE (PF) 0.25% -1:200000 IJ SOLN
INTRAMUSCULAR | Status: DC | PRN
  Administered 2016-04-16: 30 mL via PERINEURAL

## 2016-04-16 MED ORDER — OXYCODONE HCL 5 MG/5ML PO SOLN: 5.0000 mg | Freq: Once | ORAL | Status: DC | PRN

## 2016-04-16 MED ORDER — INSULIN ASPART 100 UNIT/ML ~~LOC~~ SOLN: 0.0000 [IU] | Freq: Three times a day (TID) | SUBCUTANEOUS | Status: DC

## 2016-04-16 MED ORDER — SERTRALINE HCL 100 MG PO TABS
100.0000 mg | ORAL_TABLET | Freq: Every day | ORAL | Status: DC
  Administered 2016-04-17 – 2016-04-23 (×7): 100 mg via ORAL
  Filled 2016-04-16 (×7): qty 1

## 2016-04-16 MED ORDER — ACETAMINOPHEN 10 MG/ML IV SOLN
INTRAVENOUS | Status: DC | PRN
  Administered 2016-04-16: 1000 mg via INTRAVENOUS

## 2016-04-16 MED ORDER — LEVOTHYROXINE SODIUM 100 MCG PO TABS
100.0000 ug | ORAL_TABLET | Freq: Every day | ORAL | Status: DC
  Administered 2016-04-17 – 2016-04-22 (×5): 100 ug via ORAL
  Filled 2016-04-16 (×5): qty 1

## 2016-04-16 MED ORDER — METOCLOPRAMIDE HCL 10 MG PO TABS: 5.0000 mg | ORAL_TABLET | Freq: Three times a day (TID) | ORAL | Status: DC | PRN

## 2016-04-16 MED ORDER — DEXTROSE 5 % IV SOLN
1.0000 g | INTRAVENOUS | Status: DC
  Administered 2016-04-16 – 2016-04-21 (×6): 1 g via INTRAVENOUS
  Filled 2016-04-16 (×7): qty 10

## 2016-04-16 MED ORDER — MEMANTINE HCL 10 MG PO TABS
10.0000 mg | ORAL_TABLET | Freq: Two times a day (BID) | ORAL | Status: DC
  Administered 2016-04-16 – 2016-04-23 (×14): 10 mg via ORAL
  Filled 2016-04-16 (×14): qty 1

## 2016-04-16 MED ORDER — TRANEXAMIC ACID 1000 MG/10ML IV SOLN
INTRAVENOUS | Status: DC | PRN
  Administered 2016-04-16: 100 mg via INTRAVENOUS

## 2016-04-16 MED ORDER — ACETAMINOPHEN 325 MG PO TABS
650.0000 mg | ORAL_TABLET | Freq: Four times a day (QID) | ORAL | Status: DC | PRN
  Administered 2016-04-20 – 2016-04-23 (×4): 650 mg via ORAL
  Filled 2016-04-16 (×4): qty 2

## 2016-04-16 MED ORDER — MAGNESIUM HYDROXIDE 400 MG/5ML PO SUSP
30.0000 mL | Freq: Every day | ORAL | Status: DC | PRN
  Administered 2016-04-18 – 2016-04-19 (×2): 30 mL via ORAL
  Filled 2016-04-16 (×2): qty 30

## 2016-04-16 MED ORDER — SODIUM CHLORIDE 0.9 % IJ SOLN
INTRAMUSCULAR | Status: AC
  Filled 2016-04-16: qty 50

## 2016-04-16 MED ORDER — BRINZOLAMIDE 1 % OP SUSP
1.0000 [drp] | Freq: Two times a day (BID) | OPHTHALMIC | Status: DC
  Administered 2016-04-16 – 2016-04-23 (×14): 1 [drp] via OPHTHALMIC
  Filled 2016-04-16 (×2): qty 10

## 2016-04-16 MED ORDER — SODIUM CHLORIDE 0.9 % IV SOLN
INTRAVENOUS | Status: DC
  Administered 2016-04-16: 14:00:00 via INTRAVENOUS

## 2016-04-16 MED ORDER — ASPIRIN 325 MG PO TABS
325.0000 mg | ORAL_TABLET | Freq: Every day | ORAL | Status: DC
  Administered 2016-04-17 – 2016-04-23 (×7): 325 mg via ORAL
  Filled 2016-04-16 (×7): qty 1

## 2016-04-16 MED ORDER — PROPOFOL 10 MG/ML IV BOLUS
INTRAVENOUS | Status: DC | PRN
  Administered 2016-04-16: 100 mg via INTRAVENOUS
  Administered 2016-04-16: 20 mg via INTRAVENOUS

## 2016-04-16 MED ORDER — NEOMYCIN-POLYMYXIN B GU 40-200000 IR SOLN
Status: DC | PRN
  Administered 2016-04-16: 16 mL

## 2016-04-16 MED ORDER — PHENYLEPHRINE HCL 10 MG/ML IJ SOLN
INTRAMUSCULAR | Status: DC | PRN
  Administered 2016-04-16 (×3): 100 ug via INTRAVENOUS

## 2016-04-16 MED ORDER — FENTANYL CITRATE (PF) 100 MCG/2ML IJ SOLN
INTRAMUSCULAR | Status: DC | PRN
  Administered 2016-04-16: 50 ug via INTRAVENOUS
  Administered 2016-04-16: 100 ug via INTRAVENOUS
  Administered 2016-04-16: 50 ug via INTRAVENOUS

## 2016-04-16 MED ORDER — INSULIN ASPART 100 UNIT/ML ~~LOC~~ SOLN: 0.0000 [IU] | Freq: Four times a day (QID) | SUBCUTANEOUS | Status: DC

## 2016-04-16 MED ORDER — LABETALOL HCL 5 MG/ML IV SOLN
10.0000 mg | INTRAVENOUS | Status: DC | PRN
  Administered 2016-04-16: 10 mg via INTRAVENOUS
  Filled 2016-04-16 (×2): qty 4

## 2016-04-16 MED ORDER — ACETAMINOPHEN 500 MG PO TABS
1000.0000 mg | ORAL_TABLET | Freq: Four times a day (QID) | ORAL | Status: DC
  Administered 2016-04-16 – 2016-04-17 (×3): 1000 mg via ORAL
  Filled 2016-04-16 (×3): qty 2

## 2016-04-16 MED ORDER — DOCUSATE SODIUM 100 MG PO CAPS
100.0000 mg | ORAL_CAPSULE | Freq: Two times a day (BID) | ORAL | Status: DC
  Administered 2016-04-16 – 2016-04-23 (×14): 100 mg via ORAL
  Filled 2016-04-16 (×14): qty 1

## 2016-04-16 MED ORDER — SUCCINYLCHOLINE CHLORIDE 20 MG/ML IJ SOLN
INTRAMUSCULAR | Status: DC | PRN
  Administered 2016-04-16: 80 mg via INTRAVENOUS

## 2016-04-16 MED ORDER — ATORVASTATIN CALCIUM 20 MG PO TABS
40.0000 mg | ORAL_TABLET | Freq: Every day | ORAL | Status: DC
  Administered 2016-04-16 – 2016-04-22 (×7): 40 mg via ORAL
  Filled 2016-04-16 (×7): qty 2

## 2016-04-16 SURGICAL SUPPLY — 55 items
BAG DECANTER FOR FLEXI CONT (MISCELLANEOUS) ×2 IMPLANT
BLADE SAGITTAL WIDE XTHICK NO (BLADE) ×2 IMPLANT
BLADE SURG SZ20 CARB STEEL (BLADE) ×2 IMPLANT
BNDG COHESIVE 6X5 TAN STRL LF (GAUZE/BANDAGES/DRESSINGS) ×2 IMPLANT
BOWL CEMENT MIXING ADV NOZZLE (MISCELLANEOUS) ×2 IMPLANT
CANISTER SUCT 1200ML W/VALVE (MISCELLANEOUS) ×2 IMPLANT
CANISTER SUCT 3000ML (MISCELLANEOUS) ×4 IMPLANT
CAPT HIP HEMI 2 ×2 IMPLANT
CHLORAPREP W/TINT 26ML (MISCELLANEOUS) ×4 IMPLANT
DECANTER SPIKE VIAL GLASS SM (MISCELLANEOUS) ×4 IMPLANT
DRAPE IMP U-DRAPE 54X76 (DRAPES) ×4 IMPLANT
DRAPE INCISE IOBAN 66X60 STRL (DRAPES) ×2 IMPLANT
DRAPE SHEET LG 3/4 BI-LAMINATE (DRAPES) ×2 IMPLANT
DRAPE SURG 17X23 STRL (DRAPES) ×2 IMPLANT
DRSG OPSITE POSTOP 4X12 (GAUZE/BANDAGES/DRESSINGS) ×2 IMPLANT
DRSG OPSITE POSTOP 4X14 (GAUZE/BANDAGES/DRESSINGS) IMPLANT
ELECT BLADE 6.5 EXT (BLADE) ×2 IMPLANT
ELECT CAUTERY BLADE 6.4 (BLADE) ×2 IMPLANT
ELECT REM PT RETURN 9FT ADLT (ELECTROSURGICAL) ×2
ELECTRODE REM PT RTRN 9FT ADLT (ELECTROSURGICAL) ×1 IMPLANT
GAUZE PACK 2X3YD (MISCELLANEOUS) ×2 IMPLANT
GLOVE BIO SURGEON STRL SZ8 (GLOVE) ×6 IMPLANT
GLOVE INDICATOR 8.0 STRL GRN (GLOVE) ×6 IMPLANT
GOWN STRL REUS W/ TWL LRG LVL3 (GOWN DISPOSABLE) ×1 IMPLANT
GOWN STRL REUS W/ TWL XL LVL3 (GOWN DISPOSABLE) ×1 IMPLANT
GOWN STRL REUS W/TWL LRG LVL3 (GOWN DISPOSABLE) ×1
GOWN STRL REUS W/TWL XL LVL3 (GOWN DISPOSABLE) ×1
HANDPIECE SUCTION TUBG SURGILV (MISCELLANEOUS) ×2 IMPLANT
HOOD PEEL AWAY FLYTE STAYCOOL (MISCELLANEOUS) ×6 IMPLANT
IV NS 100ML SINGLE PACK (IV SOLUTION) ×2 IMPLANT
NDL SAFETY 18GX1.5 (NEEDLE) ×2 IMPLANT
NEEDLE FILTER BLUNT 18X 1/2SAF (NEEDLE) ×1
NEEDLE FILTER BLUNT 18X1 1/2 (NEEDLE) ×1 IMPLANT
NEEDLE SPNL 20GX3.5 QUINCKE YW (NEEDLE) ×2 IMPLANT
NS IRRIG 1000ML POUR BTL (IV SOLUTION) ×2 IMPLANT
PACK HIP PROSTHESIS (MISCELLANEOUS) ×2 IMPLANT
PILLOW ABDUC SM (MISCELLANEOUS) ×2 IMPLANT
SOL .9 NS 3000ML IRR  AL (IV SOLUTION) ×2
SOL .9 NS 3000ML IRR UROMATIC (IV SOLUTION) ×2 IMPLANT
STAPLER SKIN PROX 35W (STAPLE) ×2 IMPLANT
STRAP SAFETY BODY (MISCELLANEOUS) ×2 IMPLANT
SUT ETHIBOND #5 BRAIDED 30INL (SUTURE) ×2 IMPLANT
SUT ETHIBOND 2 V 37 (SUTURE) ×4 IMPLANT
SUT ETHIBOND CT1 BRD #0 30IN (SUTURE) ×2 IMPLANT
SUT QUILL PDO 2 24X24 VLT (SUTURE) ×2 IMPLANT
SUT VIC AB 0 CT1 27 (SUTURE) ×2
SUT VIC AB 0 CT1 27XCR 8 STRN (SUTURE) ×2 IMPLANT
SUT VIC AB 1 CT1 36 (SUTURE) ×4 IMPLANT
SUT VIC AB 2-0 CT1 (SUTURE) ×2 IMPLANT
SUT VIC AB 2-0 CT1 27 (SUTURE) ×2
SUT VIC AB 2-0 CT1 TAPERPNT 27 (SUTURE) ×2 IMPLANT
SYR 30ML LL (SYRINGE) ×4 IMPLANT
SYR TB 1ML 27GX1/2 LL (SYRINGE) ×2 IMPLANT
SYRINGE 10CC LL (SYRINGE) ×2 IMPLANT
TAPE TRANSPORE STRL 2 31045 (GAUZE/BANDAGES/DRESSINGS) ×2 IMPLANT

## 2016-04-16 NOTE — Anesthesia Preprocedure Evaluation (Signed)
Anesthesia Evaluation  Patient identified by MRN, date of birth, ID band Patient awake    Reviewed: Allergy & Precautions, H&P , NPO status , Patient's Chart, lab work & pertinent test results  History of Anesthesia Complications Negative for: history of anesthetic complications  Airway Mallampati: III  TM Distance: <3 FB Neck ROM: limited    Dental  (+) Poor Dentition, Chipped   Pulmonary shortness of breath and with exertion, COPD,    Pulmonary exam normal breath sounds clear to auscultation       Cardiovascular Exercise Tolerance: Good hypertension, (-) angina+ DOE  (-) Past MI Normal cardiovascular exam Rhythm:regular Rate:Normal     Neuro/Psych  Headaches, PSYCHIATRIC DISORDERS Depression  Neuromuscular disease CVA, Residual Symptoms    GI/Hepatic Neg liver ROS, GERD  Poorly Controlled,  Endo/Other  diabetes, Type 2Hypothyroidism   Renal/GU negative Renal ROS  negative genitourinary   Musculoskeletal  (+) Arthritis ,   Abdominal   Peds  Hematology negative hematology ROS (+)   Anesthesia Other Findings Past Medical History:   Hypertension                                                 Hypercholesterolemia                                         Hypothyroidism                                               COPD (chronic obstructive pulmonary disease) (*              GERD (gastroesophageal reflux disease)                         Comment:with esophageal stricture requiring dilatation               x 2 (12/96 and 10/99)   Arthritis                                                    Glaucoma                                                     Peripheral neuropathy (HCC)                                  History of chicken pox                                       Diverticulosis  Migraines                                                    Colon polyps                                                  Urinary incontinence                                         Cancer ([place])                                                   Comment:skin  Past Surgical History:   ABDOMINAL HYSTERECTOMY                           1961           Comment:ovaries not removed   TONSILLECTOMY                                                 APPENDECTOMY                                                  bladder tack                                     [place]           Comment:bilateral   CHOLECYSTECTOMY                                               INTERSTIM IMPLANT PLACEMENT                      2011        BMI    Body Mass Index   29.10 kg/m 2      Reproductive/Obstetrics negative OB ROS                             Anesthesia Physical Anesthesia Plan  ASA: III  Anesthesia Plan: General ETT   Post-op Pain Management:    Induction:   Airway Management Planned:   Additional Equipment:   Intra-op  Plan:   Post-operative Plan:   Informed Consent: I have reviewed the patients History and Physical, chart, labs and discussed the procedure including the risks, benefits and alternatives for the proposed anesthesia with the patient or authorized representative who has indicated his/her understanding and acceptance.   Dental Advisory Given  Plan Discussed with: Anesthesiologist, CRNA and Surgeon  Anesthesia Plan Comments:         Anesthesia Quick Evaluation

## 2016-04-16 NOTE — Consult Note (Signed)
ORTHOPAEDIC CONSULTATION  REQUESTING PHYSICIAN: Hillary Bow, MD  Chief Complaint:   Left hip pain.  History of Present Illness: Sandra Kaufman is a 82 y.o. female with multiple medical problems, including COPD, hypertension, hypothyroidism, GERD, and hypercholesterolemia, who lives independently, but uses a walker for ambulation. Apparently, she tripped and fell while trying to put her slippers on yesterday evening, landing on her left hip. She was brought to the emergency room where x-rays demonstrated a displaced left femoral neck fracture. Subsequent, she was admitted to the hospitalist service for medical stabilization in preparation for definitive management of her injury. The patient denies any associated injuries resulting from the fall last evening. However, she did fall 2 days earlier and struck the right side of her face, resulting in ecchymosis around the right eye and forehead region. She was evaluated at [facility] emergency room then discharged after a thorough workup. The patient denies any lightheadedness, dizziness, shortness of breath, or other symptoms that may have precipitated the fall last evening.  Past Medical History  Diagnosis Date  . Hypertension   . Hypercholesterolemia   . Hypothyroidism   . COPD (chronic obstructive pulmonary disease) ([place])   . GERD (gastroesophageal reflux disease)     with esophageal stricture requiring dilatation x 2 (12/96 and 10/99)  . Arthritis   . Glaucoma   . Peripheral neuropathy ([place])   . History of chicken pox   . Diverticulosis   . Migraines   . Colon polyps   . Urinary incontinence   . Cancer [facility])     skin   Past Surgical History  Procedure Laterality Date  . Abdominal hysterectomy  1961    ovaries not removed  . Tonsillectomy    . Appendectomy    . Bladder tack  1976  . Cataract extraction  1997    bilateral  . Cholecystectomy     . Interstim implant placement  2011   Social History   Social History  . Marital Status: Widowed    Spouse Name: N/A  . Number of Children: 2  . Years of Education: N/A   Social History Main Topics  . Smoking status: Never Smoker   . Smokeless tobacco: Never Used  . Alcohol Use: No  . Drug Use: No  . Sexual Activity: Not Asked   Other Topics Concern  . None   Social History Narrative   Family History  Problem Relation Age of Onset  . Heart disease Father     myocardial infarction  . Arthritis/Rheumatoid Mother   . Diabetes Sister   . Epilepsy Sister   . Breast cancer Neg Hx   . Colon cancer Neg Hx    Allergies  Allergen Reactions  . Tramadol Nausea And Vomiting   Prior to Admission medications   Medication Sig Start Date End Date Taking? Authorizing Provider  albuterol (PROVENTIL HFA;VENTOLIN HFA) 108 (90 BASE) MCG/ACT inhaler Inhale 2 puffs into the lungs every 6 (six) hours as needed for wheezing or shortness of breath. 06/14/15  Yes Rubbie Battiest, NP  amitriptyline (ELAVIL) 10 MG tablet Take 20 mg by mouth at bedtime.   Yes Historical Provider, MD  aspirin 325 MG tablet Take 325 mg by mouth daily.   Yes Historical Provider, MD  atorvastatin (LIPITOR) 40 MG tablet Take 40 mg by mouth at bedtime.   Yes Historical Provider, MD  betamethasone valerate ointment (VALISONE) 0.1 % Apply 1 application topically 2 (two) times daily. Do not use for more  than 2 weeks consecutively. 03/17/16  Yes Jayce G Cook, DO  bimatoprost (LUMIGAN) 0.01 % SOLN Place 1 drop into both eyes at bedtime.   Yes Historical Provider, MD  brinzolamide (AZOPT) 1 % ophthalmic suspension Place 1 drop into both eyes 2 (two) times daily.    Yes Historical Provider, MD  Coenzyme Q10 (CO Q10) 100 MG CAPS Take 100 mg by mouth daily.    Yes Historical Provider, MD  fluticasone (FLONASE) 50 MCG/ACT nasal spray Place 2 sprays into both nostrils daily.   Yes Historical Provider, MD  levothyroxine (SYNTHROID,  LEVOTHROID) 100 MCG tablet Take 100 mcg by mouth daily before breakfast.   Yes Historical Provider, MD  memantine (NAMENDA) 10 MG tablet Take 10 mg by mouth 2 (two) times daily.   Yes Historical Provider, MD  metFORMIN (GLUCOPHAGE) 500 MG tablet Take 1 tablet (500 mg total) by mouth daily. 03/09/16  Yes Einar Pheasant, MD  pantoprazole (PROTONIX) 40 MG tablet Take 1 tablet (40 mg total) by mouth daily. 03/09/16  Yes Darren Allen Norris, MD  psyllium (METAMUCIL) 0.52 G capsule Take 2.08 g by mouth 2 (two) times daily.    Yes Historical Provider, MD  sertraline (ZOLOFT) 100 MG tablet Take 100 mg by mouth daily.   Yes Historical Provider, MD  triamcinolone cream (KENALOG) 0.1 % Apply 1 application topically 2 (two) times daily as needed (for itching).   Yes Historical Provider, MD  vitamin B-12 (CYANOCOBALAMIN) 1000 MCG tablet Take 2,000 mcg by mouth daily.   Yes Historical Provider, MD   Dg Chest 1 View  04/15/2016  CLINICAL DATA:  Fall today. Left hip fracture. Pre-op respiratory exam EXAM: CHEST 1 VIEW COMPARISON:  02/18/2016 FINDINGS: Lordotic positioning noted. The heart size is normal. No evidence of mediastinal widening or tracheal deviation. Both lungs are clear. No evidence of pneumothorax. Small to moderate size hiatal hernia again noted. IMPRESSION: No active cardiopulmonary disease. Stable small to moderate hiatal hernia. Electronically Signed   By: Earle Gell M.D.   On: 04/15/2016 20:38   Ct Abdomen Pelvis W Contrast  04/14/2016  CLINICAL DATA:  Persistent right lower quadrant pain and diarrhea. EXAM: CT ABDOMEN AND PELVIS WITH CONTRAST TECHNIQUE: Multidetector CT imaging of the abdomen and pelvis was performed using the standard protocol following bolus administration of intravenous contrast. CONTRAST:  190mL ISOVUE-300 IOPAMIDOL (ISOVUE-300) INJECTION 61% COMPARISON:  None. FINDINGS: Lower chest and abdominal wall:  Small fatty umbilical hernia. Left-sided sacral stimulator. Moderate hiatal hernia  Nonspecific mosaic attenuation the lower lungs. Bilateral peripheral lower lobe nodules measuring 4 mm or less, incidental in this patient with never smoking status in the electronic medical record Hepatobiliary: No focal liver abnormality.Cholecystectomy with negative common bile duct. Pancreas: Unremarkable. Spleen: Unremarkable. Adrenals/Urinary Tract: Negative adrenals. No hydronephrosis or stone. Unremarkable bladder. Reproductive:Hysterectomy with negative adnexa Stomach/Bowel: No obstruction. Sigmoid diverticulosis without active inflammation. Appendectomy. Remote epiploic appendagitis with well-defined fat lobule along the sigmoid anti mesenteric border. Vascular/Lymphatic: No acute vascular abnormality. No mass or adenopathy. Peritoneal: No ascites or pneumoperitoneum. Musculoskeletal: L1 compression fracture with ~25% height loss. No acute fracture line is seen and height loss is stable from February 2017 chest x-ray. Advanced lumbar disc degeneration with dextroscoliosis. IMPRESSION: 1. No acute finding or explanation for recent right lower quadrant pain. 2. Remote L1 compression fracture with mild height loss. 3. Colonic diverticulosis.  Moderate hiatal hernia. Electronically Signed   By: Monte Fantasia M.D.   On: 04/14/2016 15:28   Dg Hip Unilat With Pelvis 2-3  Views Left  04/15/2016  CLINICAL DATA:  82 year old female with history of mechanical fall today. EXAM: DG HIP (WITH OR WITHOUT PELVIS) 2-3V LEFT COMPARISON:  CT of the abdomen and pelvis 04/14/2016. FINDINGS: There is a new subcapital fracture of the left hip which appears foreshortened approximately 10 mm, with mild varus angulation (approximately 10 degrees). Femoral head remains located. Bony pelvis and visualized portions of the right proximal femur appear intact. Residual oral contrast material was noted in the colon and rectum. Sacral nerve stimulator noted projecting over the left buttock region with lead tip terminating in the region  of the left hemiMPRESSION: 1. New left femoral neck subcapital fracture, as above. Electronically Signed   By: Vinnie Langton M.D.   On: 04/15/2016 20:38    Positive ROS: All other systems have been reviewed and were otherwise negative with the exception of those mentioned in the HPI and as above.  Physical Exam: General:  Alert, no acute distress Psychiatric:  Patient is competent for consent with normal mood and affect   Cardiovascular:  No pedal edema Respiratory:  No wheezing, non-labored breathing GI:  Abdomen is soft and non-tender Skin:  No lesions in the area of chief complaint Neurologic:  Sensation intact distally Lymphatic:  No axillary or cervical lymphadenopathy  Orthopedic Exam:  Orthopedic examination is limited to the left hip and lower extremity. The left lower extremity is somewhat shortened and externally rotated as compared to the right lower extremity. Skin inspection around the hip is unremarkable. There is no swelling, erythema, ecchymosis, or abrasions. She has mild tenderness to palpation over the lateral aspect of the left hip. She has more pronounced pain with any attempted active or passive motion of the hip. She is neurovascular intact to the left lower extremity and foot.  X-rays:  Recent x-rays of the pelvis and left hip are available for review. These films demonstrate a displaced left femoral neck fracture. No significant degenerative changes of the hip joint are noted. No lytic lesions are identified.  Assessment: Displaced left femoral neck fracture.  Plan: The treatment options have been discussed with the patient and her son, who is at the bedside, including both surgical and nonsurgical options. Both the patient and her son would like to proceed with surgical intervention to include a left hip hemiarthroplasty. This procedure has been discussed in detail, as have the potential risks (including bleeding, infection, nerve and/or blood vessel injury,  persistent or recurrent pain, leg length inequality, loosening of and/or failure of the components, dislocation, progression of arthritis, need for further surgery, blood clots, strokes, heart attacks and/or arrhythmias, etc.) and benefits. The patient states her understanding and wishes to proceed. A formal written consent will be obtained by the nursing staff.  Thank you for ask me to participate in the care of this pleasant woman. I will be happy to follow her with you.   Pascal Lux, MD  Beeper #:  [phone]  04/16/2016 2:30 PM

## 2016-04-16 NOTE — Anesthesia Procedure Notes (Addendum)
Procedure Name: Intubation Performed by: Demetrius Charity Pre-anesthesia Checklist: Patient identified, Patient being monitored, Timeout performed, Emergency Drugs available and Suction available Patient Re-evaluated:Patient Re-evaluated prior to inductionOxygen Delivery Method: Circle system utilized Preoxygenation: Pre-oxygenation with 100% oxygen Intubation Type: IV induction and Rapid sequence Laryngoscope Size: 3 and McGraph Grade View: Grade I Tube type: Oral Tube size: 7.0 mm Number of attempts: 1 Airway Equipment and Method: Stylet Placement Confirmation: ETT inserted through vocal cords under direct vision,  positive ETCO2 and breath sounds checked- equal and bilateral Secured at: 22 cm Tube secured with: Tape Dental Injury: Teeth and Oropharynx as per pre-operative assessment

## 2016-04-16 NOTE — Transfer of Care (Signed)
Immediate Anesthesia Transfer of Care Note  Patient: Sandra Kaufman  Procedure(s) Performed: Procedure(s): ARTHROPLASTY BIPOLAR HIP (HEMIARTHROPLASTY) (Left)  Patient Location: PACU  Anesthesia Type:General  Level of Consciousness: awake  Airway & Oxygen Therapy: Patient Spontanous Breathing and Patient connected to face mask oxygen  Post-op Assessment: Report given to RN  Post vital signs: Reviewed  Last Vitals:  Filed Vitals:   04/16/16 1358 04/16/16 1649  BP: 173/94 115/93  Pulse: 74 105  Temp: 36.4 C 36.4 C  Resp: 18 19    Complications: No apparent anesthesia complications

## 2016-04-16 NOTE — Op Note (Signed)
04/15/2016 - 04/16/2016  4:43 PM  Patient:   Sandra Kaufman  Pre-Op Diagnosis:   Displaced femoral neck fracture, left hip.  Post-Op Diagnosis:   Same.  Procedure:   Left hip unipolar hemiarthroplasty.  Surgeon:   Pascal Lux, MD  Assistant:   None  Anesthesia:   GET  Findings:   As above.  Complications:   None  EBL:   50 cc  Fluids:   700 cc crystalloid  UOP:   300 cc  TT:   None  Drains:   None  Closure:   Staples  Implants:   Biomet press-fit system with a #9 standard offset Echo femoral stem, a 45 mm outer diameter shell, and a -6 mm neck adapter  Brief Clinical Note:   The patient is an 82 year old female who sustained the above-noted injury late yesterday afternoon in her home. She was brought to the emergency room where x-rays demonstrated the above-noted fracture. The patient has been cleared medically and presents at this time for definitive management of the injury.  Procedure:   The patient was brought into the operating room. After adequate general endotracheal intubation and anesthesia was obtained, the patient was repositioned in the right lateral decubitus position and secured using a lateral hip positioner. The left hip and lower extremity were prepped with ChloroPrep solution before being draped sterilely. Preoperative antibiotics were administered. A timeout was performed to verify the appropriate surgical site before a standard posterior approach to the hip was made through an approximately 4-5 inch incision. The incision was carried down through the subcutaneous tissues to expose the gluteal fascia and proximal end of the iliotibial band. These structures were split the length of the incision and the Charnley self-retaining hip retractor placed. The bursal tissues were swept posteriorly to expose the short external rotators. The anterior border of the piriformis tendon was identified and this plane developed down through the capsule to enter the joint.  Abundant fracture hematoma was suctioned. A flap of tissue was elevated off the posterior aspect of the femoral neck and greater trochanter and retracted posteriorly. This flap included the piriformis tendon, the short external rotators, and the posterior capsule. The femoral head was removed in its entirety, then taken to the back table where it was measured and found to be optimally replicated by a 45 mm head. The appropriate trial head was inserted and found to demonstrate an excellent suction fit.   Attention was directed to the femoral side. The femoral neck was recut 10-12 mm above the lesser trochanter using an oscillating saw. The piriformis fossa was debrided of soft tissues before the intramedullary canal was accessed through this point using a triple step reamer. The canal was reamed sequentially beginning with a #7 tapered reamer and progressing to a #9 tapered reamer. This provided excellent circumferential chatter. A box osteotome was used to establish version before the canal was broached sequentially beginning with a #7 broach and progressing to a #9 broach. This was left in place and several trial reductions performed. The permanent #9 standard offset femoral stem was impacted into place. A repeat trial reduction was performed using both the -6 mm and -3 mm neck lengths. The -6 mm neck length demonstrated excellent stability both in extension and external rotation as well as with flexion to 90 and internal rotation beyond 70. It also was stable in the position of sleep. The 45 mm outer diameter shell with the -6 mm neck adapter construct was put together on  the back table before being impacted onto the stem of the femoral component. The Morse taper locking mechanism was verified using manual distraction before the head was relocated and placed through a range of motion with the findings as described above.  The wound was copiously irrigated with bacitracin saline solution via the jet lavage  system before the peri-incisional and pericapsular tissues were injected with 30 cc of 0.5% Sensorcaine with epinephrine and 20 cc of Exparel diluted out to 60 cc with normal saline to help with postoperative analgesia. The posterior flap was reapproximated to the posterior aspect of the greater trochanter using #2 Tycron interrupted sutures placed through drill holes. Several additional #2 Tycron interrupted sutures were used to reinforce this layer of closure. The iliotibial band was reapproximated using #1 Vicryl interrupted sutures before the gluteal fascia was closed using a running #1 Vicryl suture. At this point, 1 g of transexemic acid in 10 cc of normal saline was injected into the joint to help reduce postoperative bleeding. The subcutaneous tissues were closed in several layers using 2-0 Vicryl interrupted sutures before the skin was closed using staples. A sterile occlusive dressing was applied to the wound before the patient was placed into an abduction wedge pillow. The patient was then rolled back into the supine position on the hospital bed before being awakened and returned to the recovery room in satisfactory condition after tolerating the procedure well.

## 2016-04-16 NOTE — H&P (Signed)
[facility] NAME: Sandra Kaufman    MR#:  [id number]  DATE OF BIRTH:  02/18/1932  DATE OF ADMISSION:  04/15/2016  PRIMARY CARE PHYSICIAN: Einar Pheasant, MD   REQUESTING/REFERRING PHYSICIAN: Robet Leu, MD  CHIEF COMPLAINT:   Chief Complaint  Patient presents with  . Fall    HISTORY OF PRESENT ILLNESS:  Sandra Kaufman  is a 82 y.o. female who presents with Mechanical fall and subsequent left hip fracture. Patient states that she was at home in her kitchen and there is a slippery spot that cause her to fall. On evaluation here in the ED she was noted to have left femoral fracture. Hospitals were called for admission with orthopedics on board for surgical repair.  PAST MEDICAL HISTORY:   Past Medical History  Diagnosis Date  . Hypertension   . Hypercholesterolemia   . Hypothyroidism   . COPD (chronic obstructive pulmonary disease) ([place])   . GERD (gastroesophageal reflux disease)     with esophageal stricture requiring dilatation x 2 (12/96 and 10/99)  . Arthritis   . Glaucoma   . Peripheral neuropathy ([place])   . History of chicken pox   . Diverticulosis   . Migraines   . Colon polyps   . Urinary incontinence   . Cancer ([place])     skin    PAST SURGICAL HISTORY:   Past Surgical History  Procedure Laterality Date  . Abdominal hysterectomy  1961    ovaries not removed  . Tonsillectomy    . Appendectomy    . Bladder tack  1976  . Cataract extraction  1997    bilateral  . Cholecystectomy    . Interstim implant placement  2011    SOCIAL HISTORY:   Social History  Substance Use Topics  . Smoking status: Never Smoker   . Smokeless tobacco: Never Used  . Alcohol Use: No    FAMILY HISTORY:   Family History  Problem Relation Age of Onset  . Heart disease Father     myocardial infarction  . Arthritis/Rheumatoid Mother   . Diabetes Sister   . Epilepsy Sister   . Breast cancer Neg Hx   . Colon cancer  Neg Hx     DRUG ALLERGIES:   Allergies  Allergen Reactions  . Tramadol Nausea And Vomiting    MEDICATIONS AT HOME:   Prior to Admission medications   Medication Sig Start Date End Date Taking? Authorizing Provider  albuterol (PROVENTIL HFA;VENTOLIN HFA) 108 (90 BASE) MCG/ACT inhaler Inhale 2 puffs into the lungs every 6 (six) hours as needed for wheezing or shortness of breath. 06/14/15  Yes Rubbie Battiest, NP  amitriptyline (ELAVIL) 10 MG tablet Take 20 mg by mouth at bedtime.   Yes Historical Provider, MD  aspirin 325 MG tablet Take 325 mg by mouth daily.   Yes Historical Provider, MD  atorvastatin (LIPITOR) 40 MG tablet Take 40 mg by mouth at bedtime.   Yes Historical Provider, MD  betamethasone valerate ointment (VALISONE) 0.1 % Apply 1 application topically 2 (two) times daily. Do not use for more than 2 weeks consecutively. 03/17/16  Yes Jayce G Cook, DO  bimatoprost (LUMIGAN) 0.01 % SOLN Place 1 drop into both eyes at bedtime.   Yes Historical Provider, MD  brinzolamide (AZOPT) 1 % ophthalmic suspension Place 1 drop into both eyes 2 (two) times daily.    Yes Historical Provider, MD  Coenzyme Q10 (CO Q10) 100 MG CAPS Take  100 mg by mouth daily.    Yes Historical Provider, MD  fluticasone (FLONASE) 50 MCG/ACT nasal spray Place 2 sprays into both nostrils daily.   Yes Historical Provider, MD  levothyroxine (SYNTHROID, LEVOTHROID) 100 MCG tablet Take 100 mcg by mouth daily before breakfast.   Yes Historical Provider, MD  memantine (NAMENDA) 10 MG tablet Take 10 mg by mouth 2 (two) times daily.   Yes Historical Provider, MD  metFORMIN (GLUCOPHAGE) 500 MG tablet Take 1 tablet (500 mg total) by mouth daily. 03/09/16  Yes Einar Pheasant, MD  pantoprazole (PROTONIX) 40 MG tablet Take 1 tablet (40 mg total) by mouth daily. 03/09/16  Yes Darren Allen Norris, MD  psyllium (METAMUCIL) 0.52 G capsule Take 2.08 g by mouth 2 (two) times daily.    Yes Historical Provider, MD  sertraline (ZOLOFT) 100 MG tablet  Take 100 mg by mouth daily.   Yes Historical Provider, MD  triamcinolone cream (KENALOG) 0.1 % Apply 1 application topically 2 (two) times daily as needed (for itching).   Yes Historical Provider, MD  vitamin B-12 (CYANOCOBALAMIN) 1000 MCG tablet Take 2,000 mcg by mouth daily.   Yes Historical Provider, MD    REVIEW OF SYSTEMS:  Review of Systems  Constitutional: Negative for fever, chills, weight loss and malaise/fatigue.  HENT: Negative for ear pain, hearing loss and tinnitus.   Eyes: Negative for blurred vision, double vision, pain and redness.  Respiratory: Negative for cough, hemoptysis and shortness of breath.   Cardiovascular: Negative for chest pain, palpitations, orthopnea and leg swelling.  Gastrointestinal: Negative for nausea, vomiting, abdominal pain, diarrhea and constipation.  Genitourinary: Negative for dysuria, frequency and hematuria.  Musculoskeletal: Positive for joint pain (left hip) and falls. Negative for back pain and neck pain.  Skin:       Some ecchymosis in her face where the patient has had some falls recently. No acne, rash  Neurological: Negative for dizziness, tremors, focal weakness and weakness.  Endo/Heme/Allergies: Negative for polydipsia. Does not bruise/bleed easily.  Psychiatric/Behavioral: Negative for depression. The patient is not nervous/anxious and does not have insomnia.      VITAL SIGNS:   Filed Vitals:   04/15/16 2130 04/15/16 2200 04/15/16 2239 04/15/16 2300  BP: 175/87 171/98 176/95 171/91  Pulse: 90 89 87 88  Temp:      TempSrc:      Resp:      SpO2: 91% 94% 95% 92%   Wt Readings from Last 3 Encounters:  04/11/16 72.576 kg (160 lb)  03/26/16 77.225 kg (170 lb 4 oz)  03/17/16 76.459 kg (168 lb 9 oz)    PHYSICAL EXAMINATION:  Physical Exam  Vitals reviewed. Constitutional: She is oriented to person, place, and time. She appears well-developed and well-nourished. No distress.  HENT:  Head: Normocephalic and atraumatic.    Mouth/Throat: Oropharynx is clear and moist.  Eyes: Conjunctivae and EOM are normal. Pupils are equal, round, and reactive to light. No scleral icterus.  Neck: Normal range of motion. Neck supple. No JVD present. No thyromegaly present.  Cardiovascular: Normal rate, regular rhythm and intact distal pulses.  Exam reveals no gallop and no friction rub.   No murmur heard. Respiratory: Effort normal and breath sounds normal. No respiratory distress. She has no wheezes. She has no rales.  GI: Soft. Bowel sounds are normal. She exhibits no distension. There is no tenderness.  Musculoskeletal: Normal range of motion. She exhibits tenderness (Left hip). She exhibits no edema.  No arthritis, no gout  Lymphadenopathy:  She has no cervical adenopathy.  Neurological: She is alert and oriented to person, place, and time. No cranial nerve deficit.  No dysarthria, no aphasia  Skin: Skin is warm and dry. No rash noted. No erythema.  Ecchymosis around her eyes and on her face.  Psychiatric: She has a normal mood and affect. Her behavior is normal. Judgment and thought content normal.    LABORATORY PANEL:   CBC  Recent Labs Lab 04/15/16 2024  WBC 9.6  HGB 12.7  HCT 36.5  PLT 195   ------------------------------------------------------------------------------------------------------------------  Chemistries   Recent Labs Lab 04/15/16 2024  NA 141  K 3.2*  CL 108  CO2 24  GLUCOSE 159*  BUN 15  CREATININE 0.71  CALCIUM 8.8*   ------------------------------------------------------------------------------------------------------------------  Cardiac Enzymes No results for input(s): TROPONINI in the last 168 hours. ------------------------------------------------------------------------------------------------------------------  RADIOLOGY:  Dg Chest 1 View  04/15/2016  CLINICAL DATA:  Fall today. Left hip fracture. Pre-op respiratory exam EXAM: CHEST 1 VIEW COMPARISON:  02/18/2016  FINDINGS: Lordotic positioning noted. The heart size is normal. No evidence of mediastinal widening or tracheal deviation. Both lungs are clear. No evidence of pneumothorax. Small to moderate size hiatal hernia again noted. IMPRESSION: No active cardiopulmonary disease. Stable small to moderate hiatal hernia. Electronically Signed   By: Earle Gell M.D.   On: 04/15/2016 20:38   Ct Abdomen Pelvis W Contrast  04/14/2016  CLINICAL DATA:  Persistent right lower quadrant pain and diarrhea. EXAM: CT ABDOMEN AND PELVIS WITH CONTRAST TECHNIQUE: Multidetector CT imaging of the abdomen and pelvis was performed using the standard protocol following bolus administration of intravenous contrast. CONTRAST:  147mL ISOVUE-300 IOPAMIDOL (ISOVUE-300) INJECTION 61% COMPARISON:  None. FINDINGS: Lower chest and abdominal wall:  Small fatty umbilical hernia. Left-sided sacral stimulator. Moderate hiatal hernia Nonspecific mosaic attenuation the lower lungs. Bilateral peripheral lower lobe nodules measuring 4 mm or less, incidental in this patient with never smoking status in the electronic medical record Hepatobiliary: No focal liver abnormality.Cholecystectomy with negative common bile duct. Pancreas: Unremarkable. Spleen: Unremarkable. Adrenals/Urinary Tract: Negative adrenals. No hydronephrosis or stone. Unremarkable bladder. Reproductive:Hysterectomy with negative adnexa Stomach/Bowel: No obstruction. Sigmoid diverticulosis without active inflammation. Appendectomy. Remote epiploic appendagitis with well-defined fat lobule along the sigmoid anti mesenteric border. Vascular/Lymphatic: No acute vascular abnormality. No mass or adenopathy. Peritoneal: No ascites or pneumoperitoneum. Musculoskeletal: L1 compression fracture with ~25% height loss. No acute fracture line is seen and height loss is stable from February 2017 chest x-ray. Advanced lumbar disc degeneration with dextroscoliosis. IMPRESSION: 1. No acute finding or explanation  for recent right lower quadrant pain. 2. Remote L1 compression fracture with mild height loss. 3. Colonic diverticulosis.  Moderate hiatal hernia. Electronically Signed   By: Monte Fantasia M.D.   On: 04/14/2016 15:28   Dg Hip Unilat With Pelvis 2-3 Views Left  04/15/2016  CLINICAL DATA:  82 year old female with history of mechanical fall today. EXAM: DG HIP (WITH OR WITHOUT PELVIS) 2-3V LEFT COMPARISON:  CT of the abdomen and pelvis 04/14/2016. FINDINGS: There is a new subcapital fracture of the left hip which appears foreshortened approximately 10 mm, with mild varus angulation (approximately 10 degrees). Femoral head remains located. Bony pelvis and visualized portions of the right proximal femur appear intact. Residual oral contrast material was noted in the colon and rectum. Sacral nerve stimulator noted projecting over the left buttock region with lead tip terminating in the region of the left hemiMPRESSION: 1. New left femoral neck subcapital fracture, as above. Electronically Signed  By: Vinnie Langton M.D.   On: 04/15/2016 20:38    EKG:   Orders placed or performed during the hospital encounter of 04/15/16  . EKG 12-Lead  . EKG 12-Lead    IMPRESSION AND PLAN:  Principal Problem:   Fracture of femoral neck, left (HCC) - consult orthopedics for surgical repair. Pain control on board. Risk stratification as below. Patient has a prior history of stroke as the only risk factor. See below for risk of cardiac complications intraoperatively.   Active Problems:   Diabetes mellitus (HCC) - sliding scale insulin with corresponding glucose checks   Hypertension - currently stable, continue home meds   Chronic obstructive pulmonary disease ([place]) - continue home inhalers   Hypothyroidism - home dose thyroid replacement   Hypercholesterolemia - home dose anti-lipids   GERD (gastroesophageal reflux disease) - home dose PPI  All the records are reviewed and case discussed with ED  provider. Management plans discussed with the patient and/or family.  DVT PROPHYLAXIS: Mechanical only  GI PROPHYLAXIS: PPI  ADMISSION STATUS: Inpatient  CODE STATUS: DNR, Discussed this with the patient and she understands that for the duration of her surgical procedure DO NOT RESUSCITATE will likely need to be reversed. Code Status History    This patient does not have a recorded code status. Please follow your organizational policy for patients in this situation.    Advance Directive Documentation        Most Recent Value   Type of Advance Directive  Living will   Pre-existing out of facility DNR order (yellow form or pink MOST form)     "MOST" Form in Place?        TOTAL TIME TAKING CARE OF THIS PATIENT: 45 minutes.    Nanami [place]elaw FIELDING 04/16/2016, 1:00 AM  [facility]  Office  [phone]  CC: Primary care physician; Einar Pheasant, MD

## 2016-04-17 ENCOUNTER — Encounter
Admission: RE | Admit: 2016-04-17 | Discharge: 2016-04-17 | Disposition: A | Discharge status: Home / Self Care | Payer: Medicare Other | Source: Ambulatory Visit | Attending: Internal Medicine | Admitting: Internal Medicine

## 2016-04-17 ENCOUNTER — Encounter: Payer: Self-pay | Admitting: Surgery

## 2016-04-17 ENCOUNTER — Inpatient Hospital Stay: Payer: Medicare Other

## 2016-04-17 LAB — GLUCOSE, CAPILLARY
Glucose-Capillary: 187 mg/dL — ABNORMAL HIGH (ref 65–99)
Glucose-Capillary: 171 mg/dL — ABNORMAL HIGH (ref 65–99)
Glucose-Capillary: 145 mg/dL — ABNORMAL HIGH (ref 65–99)
Glucose-Capillary: 93 mg/dL (ref 65–99)

## 2016-04-17 LAB — CBC WITH DIFFERENTIAL/PLATELET
Basophils Absolute: 0.1 10*3/uL (ref 0–0.1)
Basophils Relative: 1 %
Eosinophils Absolute: 0.3 10*3/uL (ref 0–0.7)
Eosinophils Relative: 4 %
HCT: 32.5 % — ABNORMAL LOW (ref 35.0–47.0)
Hemoglobin: 11.1 g/dL — ABNORMAL LOW (ref 12.0–16.0)
Lymphocytes Relative: 7 %
Lymphs Abs: 0.6 10*3/uL — ABNORMAL LOW (ref 1.0–3.6)
MCH: 29.5 pg (ref 26.0–34.0)
MCHC: 34.2 g/dL (ref 32.0–36.0)
MCV: 86.3 fL (ref 80.0–100.0)
Monocytes Absolute: 0.6 10*3/uL (ref 0.2–0.9)
Monocytes Relative: 7 %
Neutro Abs: 7.1 10*3/uL — ABNORMAL HIGH (ref 1.4–6.5)
Neutrophils Relative %: 81 %
Platelets: 177 10*3/uL (ref 150–440)
RBC: 3.76 MIL/uL — ABNORMAL LOW (ref 3.80–5.20)
RDW: 14.6 % — ABNORMAL HIGH (ref 11.5–14.5)
WBC: 8.7 10*3/uL (ref 3.6–11.0)

## 2016-04-17 LAB — BASIC METABOLIC PANEL
Anion gap: 6 (ref 5–15)
BUN: 10 mg/dL (ref 6–20)
CO2: 25 mmol/L (ref 22–32)
Calcium: 7.9 mg/dL — ABNORMAL LOW (ref 8.9–10.3)
Chloride: 107 mmol/L (ref 101–111)
Creatinine, Ser: 0.7 mg/dL (ref 0.44–1.00)
GFR calc Af Amer: >60 mL/min (ref >60)
GFR calc non Af Amer: >60 mL/min (ref >60)
Glucose, Bld: 186 mg/dL — ABNORMAL HIGH (ref 65–99)
Potassium: 3.6 mmol/L (ref 3.5–5.1)
Sodium: 138 mmol/L (ref 135–145)

## 2016-04-17 MED ORDER — CIPROFLOXACIN HCL 500 MG PO TABS: 500.0000 mg | ORAL_TABLET | Freq: Two times a day (BID) | ORAL | Status: DC

## 2016-04-17 MED ORDER — OXYCODONE HCL 5 MG PO TABS: 5.0000 mg | ORAL_TABLET | Freq: Four times a day (QID) | ORAL | Status: DC | PRN

## 2016-04-17 MED ORDER — ENOXAPARIN SODIUM 40 MG/0.4ML ~~LOC~~ SOLN: 40.0000 mg | SUBCUTANEOUS | Status: DC

## 2016-04-17 MED ORDER — DOCUSATE SODIUM 100 MG PO CAPS
100.0000 mg | ORAL_CAPSULE | Freq: Two times a day (BID) | ORAL | Status: DC
Start: 2016-04-17 — End: 2023-12-02

## 2016-04-17 NOTE — NC FL2 (Signed)
[place] LEVEL OF CARE SCREENING TOOL     IDENTIFICATION  Patient Name: Sandra Kaufman Birthdate: [birth year] Sex: female Admission Date (Current Location): 04/15/2016  [place] and [place] Number:  Engineering geologist and Address:  [facility], [address], [place], [place] [address]      Provider Number: [id number]  Attending Physician Name and Address:  Hillary Bow, MD  Relative Name and Phone Number:       Current Level of Care: Hospital Recommended Level of Care: [place] Prior Approval Number:    Date Approved/Denied:   PASRR Number:  ( [id number] )  Discharge Plan: SNF    Current Diagnoses: Patient Active Problem List   Diagnosis Date Noted  . Fracture of femoral neck, left ([place]) 04/16/2016  . Fracture, femur, neck, left, closed, initial encounter 04/16/2016  . Right lower quadrant pain 03/29/2016  . Vomiting 03/17/2016  . Rash 03/17/2016  . Diarrhea 02/18/2016  . Adult hypothyroidism 01/29/2016  . Fall 01/19/2016  . Anemia 10/20/2015  . Headache, migraine 10/10/2015  . HLD (hyperlipidemia) 10/10/2015  . Glaucoma 10/10/2015  . Chronic obstructive pulmonary disease ([place]) 10/10/2015  . Arthritis 10/10/2015  . Hypertrophic toenail 07/23/2015  . Health care maintenance 05/27/2015  . Difficulty in walking 10/04/2014  . Cerebral ataxia ([place]) 10/04/2014  . Unsteady gait 08/22/2014  . Obesity 08/22/2014  . Dysphagia 07/14/2014  . Amnesia 05/24/2014  . B12 deficiency 05/24/2014  . 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 SOB (shortness of breath) 05/06/2014  . CVA (cerebral vascular accident) ([place]) 03/04/2014  . Diverticulosis 04/29/2013  . GERD (gastroesophageal reflux disease) 11/27/2012  . Urinary incontinence 11/27/2012  . Ovarian cyst 11/27/2012  . Depression 11/27/2012  . Neuropathy ([place]) 11/27/2012  . Hypothyroidism 11/15/2012  . Hypercholesterolemia 11/15/2012  . Diabetes mellitus ([place])  11/15/2012  . Hypertension 11/15/2012    Orientation RESPIRATION BLADDER Height & Weight     Self, Place, Situation, Time  O2 (2 Liters Oxygen ) Continent Weight: 174 lb 14.4 oz (79.334 kg) Height:  5\' 5"  (165.1 cm)  BEHAVIORAL SYMPTOMS/MOOD NEUROLOGICAL BOWEL NUTRITION STATUS   (none )  (none ) Continent Diet (SLP to evaluate )  AMBULATORY STATUS COMMUNICATION OF NEEDS Skin   Extensive Assist Verbally Surgical wounds (Incision: Left Hip. )                       Personal Care Assistance Level of Assistance  Bathing, Feeding, Dressing Bathing Assistance: Limited assistance Feeding assistance: Independent Dressing Assistance: Limited assistance     Functional Limitations Info  Sight, Hearing, Speech Sight Info: Impaired Hearing Info: Impaired Speech Info: Adequate    SPECIAL CARE FACTORS FREQUENCY  PT (By licensed PT), OT (By licensed OT)     PT Frequency:  (5) OT Frequency:  (5)            Contractures      Additional Factors Info  Code Status, Allergies Code Status Info:  (DNR ) Allergies Info:  (Tramadol )           Current Medications (04/17/2016):  This is the current hospital active medication list Current Facility-Administered Medications  Medication Dose Route Frequency Provider Last Rate Last Dose  . acetaminophen (TYLENOL) tablet 650 mg  650 mg Oral Q6H PRN Corky Mull, MD       Or  . acetaminophen (TYLENOL) suppository 650 mg  650 mg Rectal Q6H PRN Corky Mull, MD      .  acetaminophen (TYLENOL) tablet 1,000 mg  1,000 mg Oral Q6H Corky Mull, MD   1,000 mg at 04/17/16 0513  . albuterol (PROVENTIL) (2.5 MG/3ML) 0.083% nebulizer solution 3 mL  3 mL Inhalation Q6H PRN Lance Coon, MD      . aspirin tablet 325 mg  325 mg Oral Daily Lance Coon, MD   325 mg at 04/16/16 1158  . atorvastatin (LIPITOR) tablet 40 mg  40 mg Oral QHS Lance Coon, MD   40 mg at 04/16/16 2120  . bisacodyl (DULCOLAX) suppository 10 mg  10 mg Rectal Daily PRN Corky Mull, MD      . brinzolamide (AZOPT) 1 % ophthalmic suspension 1 drop  1 drop Both Eyes BID Lance Coon, MD   1 drop at 04/16/16 2140  . cefTRIAXone (ROCEPHIN) 1 g in dextrose 5 % 50 mL IVPB  1 g Intravenous Q24H Hillary Bow, MD   1 g at 04/16/16 0842  . dextrose 5 % and 0.9 % NaCl with KCl 20 mEq/L infusion   Intravenous Continuous Corky Mull, MD 75 mL/hr at 04/16/16 1845    . diphenhydrAMINE (BENADRYL) 12.5 MG/5ML elixir 12.5-25 mg  12.5-25 mg Oral Q4H PRN Corky Mull, MD      . docusate sodium (COLACE) capsule 100 mg  100 mg Oral BID Corky Mull, MD   100 mg at 04/16/16 2120  . enoxaparin (LOVENOX) injection 40 mg  40 mg Subcutaneous Q24H Corky Mull, MD      . HYDROmorphone (DILAUDID) injection 0.5-1 mg  0.5-1 mg Intravenous Q2H PRN Corky Mull, MD      . insulin aspart (novoLOG) injection 0-15 Units  0-15 Units Subcutaneous TID WC Hillary Bow, MD   0 Units at 04/16/16 1700  . labetalol (NORMODYNE,TRANDATE) injection 10 mg  10 mg Intravenous Q2H PRN Lance Coon, MD   10 mg at 04/16/16 0108  . latanoprost (XALATAN) 0.005 % ophthalmic solution 1 drop  1 drop Both Eyes QHS Lance Coon, MD   1 drop at 04/16/16 2140  . levothyroxine (SYNTHROID, LEVOTHROID) tablet 100 mcg  100 mcg Oral QAC breakfast Lance Coon, MD   100 mcg at 04/16/16 1158  . magnesium hydroxide (MILK OF MAGNESIA) suspension 30 mL  30 mL Oral Daily PRN Corky Mull, MD      . memantine [facility]) tablet 10 mg  10 mg Oral BID Lance Coon, MD   10 mg at 04/16/16 2120  . metoCLOPramide (REGLAN) tablet 5-10 mg  5-10 mg Oral Q8H PRN Corky Mull, MD       Or  . metoCLOPramide (REGLAN) injection 5-10 mg  5-10 mg Intravenous Q8H PRN Corky Mull, MD      . ondansetron [facility]) tablet 4 mg  4 mg Oral Q6H PRN Corky Mull, MD       Or  . ondansetron [facility]) injection 4 mg  4 mg Intravenous Q6H PRN Corky Mull, MD      . oxyCODONE (Oxy IR/ROXICODONE) immediate release tablet 5-10 mg  5-10 mg Oral Q3H PRN Corky Mull, MD    5 mg at 04/17/16 0513  . pantoprazole (PROTONIX) EC tablet 40 mg  40 mg Oral Daily Lance Coon, MD   40 mg at 04/16/16 1159  . sertraline (ZOLOFT) tablet 100 mg  100 mg Oral Daily Lance Coon, MD   100 mg at 04/16/16 1159  . sodium chloride flush (NS) 0.9 % injection 3 mL  3  mL Intravenous Q12H Lance Coon, MD   3 mL at 04/16/16 2121  . sodium phosphate (FLEET) 7-19 GM/118ML enema 1 enema  1 enema Rectal Once PRN Corky Mull, MD         Discharge Medications: Please see discharge summary for a list of discharge medications.  Relevant Imaging Results:  Relevant Lab Results:   Additional Information  (SSN: [id number])  Loralyn Freshwater, LCSW

## 2016-04-17 NOTE — Anesthesia Postprocedure Evaluation (Signed)
Anesthesia Post Note  Patient: Sandra Kaufman  Procedure(s) Performed: Procedure(s) (LRB): ARTHROPLASTY BIPOLAR HIP (HEMIARTHROPLASTY) (Left)  Patient location during evaluation: PACU Anesthesia Type: Spinal Level of consciousness: oriented and awake and alert Pain management: pain level controlled Vital Signs Assessment: post-procedure vital signs reviewed and stable Respiratory status: spontaneous breathing, respiratory function stable and patient connected to nasal cannula oxygen Cardiovascular status: blood pressure returned to baseline and stable Postop Assessment: no headache and no backache Anesthetic complications: no    Last Vitals:  Filed Vitals:   04/17/16 0750 04/17/16 1105  BP: 145/64 130/72  Pulse: 94 87  Temp: 37.2 C 36.8 C  Resp: 20 18    Last Pain:  Filed Vitals:   04/17/16 1105  PainSc: 1                  Flem Enderle K Canna Nickelson

## 2016-04-17 NOTE — Clinical Social Work Note (Signed)
Clinical Social Work Assessment  Patient Details  Name: Sandra Kaufman MRN: [id number] Date of Birth: [birth year]  Date of referral:  04/17/16               Reason for consult:  Facility Placement                Permission sought to share information with:  Chartered certified accountant granted to share information::  Yes, Verbal Permission Granted  Name::      IT sales professional::   [place]   Relationship::     Contact Information:     Housing/Transportation Living arrangements for the past 2 months:  [place] of Information:  Patient, Adult Children Patient Interpreter Needed:  None Criminal Activity/Legal Involvement Pertinent to Current Situation/Hospitalization:  No - Comment as needed Significant Relationships:  Adult Children Lives with:  Self Do you feel safe going back to the place where you live?  Yes Need for family participation in patient care:  Yes (Comment)  Care giving concerns:  Patient lives in [place] alone.    Social Worker assessment / plan:  Holiday representative (CSW) received SNF consult. PT is recommending SNF. CSW met with patient alone at bedside. Patient was oriented to self and place however appeared lethargic. Patient stated that she lives alone and is agreeable to SNF. CSW contacted patient's son Sandra Kaufman [phone]. Per son patient lives alone in [place] and he lives in [place]. Per son he wants patient to go to [facility] for rehab. Sandra Kaufman admissions coordinator at [facility] made a bed offer. Son accepted bed offer. Plan is for patient to D/C to [facility] Sunday 04/19/16 pending medical clearance. CSW sent D/C orders to [facility] on Friday. Per Sandra Kaufman patient will go to room 210-B and RN will call report at [phone]. Patient and son are aware of above. CSW will continue to follow and assist as needed.    Employment status:  Disabled (Comment on whether or not currently receiving Disability),  Retired Forensic scientist:  Medicare PT Recommendations:  [place] / Referral to community resources:  [place]  Patient/Family's Response to care:  Patient and son are agreeable for patient to go to [facility].   Patient/Family's Understanding of and Emotional Response to Diagnosis, Current Treatment, and Prognosis:  Patient was pleasant and thanked CSW for visit.   Emotional Assessment Appearance:  Appears stated age Attitude/Demeanor/Rapport:    Affect (typically observed):  Accepting, Adaptable, Pleasant Orientation:  Oriented to Self, Oriented to Place, Fluctuating Orientation (Suspected and/or reported Sundowners) Alcohol / Substance use:  Not Applicable Psych involvement (Current and /or in the community):  No (Comment)  Discharge Needs  Concerns to be addressed:  Discharge Planning Concerns Readmission within the last 30 days:  No Current discharge risk:  Dependent with Mobility Barriers to Discharge:  Continued Medical Work up   Elwyn Reach 04/17/2016, 4:15 PM

## 2016-04-17 NOTE — Progress Notes (Signed)
Subjective: No complaints this morning. Pain in left hip under good control. No nausea or vomiting.   Objective: Vital signs in last 24 hours: Temp:  [97.5 F (36.4 C)-99 F (37.2 C)] 99 F (37.2 C) (04/21 0750) Pulse Rate:  [74-105] 94 (04/21 0750) Resp:  [14-25] 20 (04/21 0750) BP: (95-196)/(64-94) 145/64 mmHg (04/21 0750) SpO2:  [89 %-99 %] 95 % (04/21 0750)  Intake/Output from previous day: 04/20 0701 - 04/21 0700 In: 1225 [I.V.:1175; IV Piggyback:50] Out: 750 [Urine:700; Blood:50] Intake/Output this shift:     Recent Labs  04/15/16 2024 04/16/16 0458 04/17/16 0449  HGB 12.7 11.8* 11.1*    Recent Labs  04/16/16 0458 04/17/16 0449  WBC 9.0 8.7  RBC 4.02 3.76*  HCT 34.3* 32.5*  PLT 178 177    Recent Labs  04/16/16 0458 04/17/16 0449  NA 140 138  K 3.2* 3.6  CL 108 107  CO2 27 25  BUN 13 10  CREATININE 0.66 0.70  GLUCOSE 181* 186*  CALCIUM 8.5* 7.9*   No results for input(s): LABPT, INR in the last 72 hours.  Physical Exam: Dressing dry and intact. Neurovascularly intact to left lower extremity.  Abdomen: Mildly distended and tympanitic, but nontender.  X-rays: Postoperative hip films show excellent position of the component without any fractures or other perioperative complications.  Assessment: Status post left hip hemiarthroplasty.  Plan: The patient may be moved from bed to chair and initiate physical therapy today, weightbearing as tolerated on the left lower extremity. She does have some distention of her abdomen, so she needs to be watched closely for the development of a postoperative ileus. Therefore, I would advance her diet slowly. Continue her present pain medication regimen as needed, as well as her present DVT prophylaxis regimen. Most likely, she will require rehabilitation placement.   Marshall Cork Poggi 04/17/2016, 8:06 AM

## 2016-04-17 NOTE — Evaluation (Signed)
Physical Therapy Evaluation Patient Details Name: Sandra Kaufman MRN: [id number] DOB: [birth year] Today's Date: 04/17/2016   History of Present Illness  82 y/o female here after a fall at home with L hip fracture requiring a hemiarthroplasty  Clinical Impression  Pt is initially willing to participate, but her confusion makes some of the session difficult.  She is limited with her ability to follow instructions and though she is able to show some effort with AAROM she is very weak and pain limited.  Pt does poorly with ability to effectively bear weight on L and is essentially dependent with getting from bed to recliner.      Follow Up Recommendations SNF    Equipment Recommendations   (TBD)    Recommendations for Other Services       Precautions / Restrictions Precautions Precautions: Fall;Anterior Hip Precaution Booklet Issued: Yes (comment) Restrictions LLE Weight Bearing: Weight bearing as tolerated      Mobility  Bed Mobility Overal bed mobility: Needs Assistance Bed Mobility: Supine to Sit     Supine to sit: Max assist     General bed mobility comments: Pt shows some effort to get to sitting, but ultimately needs quite a bit of assistance  Transfers Overall transfer level: Needs assistance Equipment used: Rolling walker (2 wheeled) Transfers: Sit to/from Stand Sit to Stand: Mod assist         General transfer comment: Pt shows some ability to assist to standing, but again needs a lot of cuing, encouragement and assist to get to and maintain standing  Ambulation/Gait Ambulation/Gait assistance: Total assist Ambulation Distance (Feet): 3 Feet Assistive device: Rolling walker (2 wheeled)       General Gait Details: Pt essentially unable to do any real ambulation.  She was able to move L LE a few inches on 1 attempt, could not replicate even with heavy assist. Unable to at all to move R LE except with PT fully unweighting L and actively assisting with LE  movement.   Stairs            Wheelchair Mobility    Modified Rankin (Stroke Patients Only)       Balance                                             Pertinent Vitals/Pain Pain Assessment:  (unable to rate, indicates pain with all L LE movement)    Home Living Family/patient expects to be discharged to:: Skilled nursing facility Living Arrangements:  (pt has [facility] services 24/7)                    Prior Function Level of Independence:  (unable to answer appropriately, appears she was limited )               Hand Dominance        Extremity/Trunk Assessment   Upper Extremity Assessment: Generalized weakness           Lower Extremity Assessment: LLE deficits/detail   LLE Deficits / Details: Pt with very little ability to move L LE w/o considerable assist, R LE grossly 3/5     Communication   Communication:  (some confusionq)  Cognition Arousal/Alertness: Lethargic Behavior During Therapy: Anxious Overall Cognitive Status: Difficult to assess  General Comments      Exercises Total Joint Exercises Ankle Circles/Pumps: AROM;5 reps Quad Sets: Strengthening;10 reps Gluteal Sets: Strengthening;10 reps Heel Slides: 5 reps;AAROM Hip ABduction/ADduction: AAROM;5 reps      Assessment/Plan    PT Assessment Patient needs continued PT services  PT Diagnosis Acute pain;Generalized weakness;Difficulty walking   PT Problem List Decreased strength;Decreased activity tolerance;Decreased balance;Decreased range of motion;Decreased mobility;Decreased knowledge of use of DME;Decreased safety awareness;Decreased knowledge of precautions;Pain  PT Treatment Interventions DME instruction;Gait training;Functional mobility training;Therapeutic activities;Therapeutic exercise;Balance training;Cognitive remediation;Neuromuscular re-education;Patient/family education   PT Goals (Current goals can be found in  the Care Plan section) Acute Rehab PT Goals PT Goal Formulation: Patient unable to participate in goal setting Time For Goal Achievement: 04/17/16 Potential to Achieve Goals: Fair    Frequency BID   Barriers to discharge        Co-evaluation               End of Session Equipment Utilized During Treatment: Gait belt Activity Tolerance: Patient limited by pain Patient left: in chair;with call bell/phone within reach;with nursing/sitter in room           Time: 0931-0958 PT Time Calculation (min) (ACUTE ONLY): 27 min   Charges:   PT Evaluation $PT Eval Moderate Complexity: 1 Procedure PT Treatments $Therapeutic Exercise: 8-22 mins   PT G Codes:       Wayne Both, PT, DPT [phone]  Kreg Shropshire 04/17/2016, 1:18 PM

## 2016-04-17 NOTE — Discharge Summary (Addendum)
[facility] NAME: Sandra Kaufman    MR#:  [id number]  DATE OF BIRTH:  [birth year]  DATE OF ADMISSION:  04/15/2016 ADMITTING PHYSICIAN: Lance Coon, MD  DATE OF DISCHARGE: No discharge date for patient encounter.  PRIMARY CARE PHYSICIAN: Einar Pheasant, MD   ADMISSION DIAGNOSIS:  Hip fracture requiring operative repair, left, closed, initial encounter ([place]) [S72.002A]  DISCHARGE DIAGNOSIS:  Principal Problem:   Fracture of femoral neck, left ([place]) Active Problems:   Hypothyroidism   Hypercholesterolemia   Diabetes mellitus ([place])   Hypertension   GERD (gastroesophageal reflux disease)   Chronic obstructive pulmonary disease (HCC)   Fracture, femur, neck, left, closed, initial encounter   SECONDARY DIAGNOSIS:   Past Medical History  Diagnosis Date  . Hypertension   . Hypercholesterolemia   . Hypothyroidism   . COPD (chronic obstructive pulmonary disease) ([place])   . GERD (gastroesophageal reflux disease)     with esophageal stricture requiring dilatation x 2 (12/96 and 10/99)  . Arthritis   . Glaucoma   . Peripheral neuropathy ([place])   . History of chicken pox   . Diverticulosis   . Migraines   . Colon polyps   . Urinary incontinence   . Cancer [facility])     skin     ADMITTING HISTORY  Sandra Kaufman is a 82 y.o. female who presents with Mechanical fall and subsequent left hip fracture. Patient states that she was at home in her kitchen and there is a slippery spot that cause her to fall. On evaluation here in the ED she was noted to have left femoral fracture. Hospitals were called for admission with orthopedics on board for surgical repair.  HOSPITAL COURSE:   * Left hip fracture Status post surgery On Pain meds and Lovenox for DVT prophylaxis Patient worked with physical therapy in the hospital and needs further rehabilitation. DC to skilled nursing facility  * DM Home meds  * UTI Cultures were not  sent. Treated empirically with IV ceftriaxone. Change to ciprofloxacin at discharge for 2 more days  * DVT prophylaxis on Lovenox  * Chronic dysphagia Dysphagia 2 diet with Thin liquids Strict aspiration and reflux precautions  Stable for discharge to skilled nursing facility.  CONSULTS OBTAINED:  Treatment Team:  Corky Mull, MD  DRUG ALLERGIES:   Allergies  Allergen Reactions  . Tramadol Nausea And Vomiting    DISCHARGE MEDICATIONS:   Current Discharge Medication List    START taking these medications   Details  ciprofloxacin (CIPRO) 500 MG tablet Take 1 tablet (500 mg total) by mouth 2 (two) times daily. Qty: 4 tablet, Refills: 0    docusate sodium (COLACE) 100 MG capsule Take 1 capsule (100 mg total) by mouth 2 (two) times daily. Qty: 10 capsule, Refills: 0    enoxaparin (LOVENOX) 40 MG/0.4ML injection Inject 0.4 mLs (40 mg total) into the skin daily. Qty: 19 Syringe, Refills: 0    oxyCODONE (OXY IR/ROXICODONE) 5 MG immediate release tablet Take 1 tablet (5 mg total) by mouth every 6 (six) hours as needed for breakthrough pain. Qty: 30 tablet, Refills: 0      CONTINUE these medications which have NOT CHANGED   Details  albuterol (PROVENTIL HFA;VENTOLIN HFA) 108 (90 BASE) MCG/ACT inhaler Inhale 2 puffs into the lungs every 6 (six) hours as needed for wheezing or shortness of breath. Qty: 1 Inhaler, Refills: 2    amitriptyline (ELAVIL) 10 MG tablet Take 20 mg by  mouth at bedtime.    aspirin 325 MG tablet Take 325 mg by mouth daily.    atorvastatin (LIPITOR) 40 MG tablet Take 40 mg by mouth at bedtime.    betamethasone valerate ointment (VALISONE) 0.1 % Apply 1 application topically 2 (two) times daily. Do not use for more than 2 weeks consecutively. Qty: 30 g, Refills: 0    bimatoprost (LUMIGAN) 0.01 % SOLN Place 1 drop into both eyes at bedtime.    brinzolamide (AZOPT) 1 % ophthalmic suspension Place 1 drop into both eyes 2 (two) times daily.      Coenzyme Q10 (CO Q10) 100 MG CAPS Take 100 mg by mouth daily.     fluticasone (FLONASE) 50 MCG/ACT nasal spray Place 2 sprays into both nostrils daily.    levothyroxine (SYNTHROID, LEVOTHROID) 100 MCG tablet Take 100 mcg by mouth daily before breakfast.    memantine (NAMENDA) 10 MG tablet Take 10 mg by mouth 2 (two) times daily.    metFORMIN (GLUCOPHAGE) 500 MG tablet Take 1 tablet (500 mg total) by mouth daily. Qty: 30 tablet, Refills: 2    pantoprazole (PROTONIX) 40 MG tablet Take 1 tablet (40 mg total) by mouth daily. Qty: 30 tablet, Refills: 11    psyllium (METAMUCIL) 0.52 G capsule Take 2.08 g by mouth 2 (two) times daily.     sertraline (ZOLOFT) 100 MG tablet Take 100 mg by mouth daily.    triamcinolone cream (KENALOG) 0.1 % Apply 1 application topically 2 (two) times daily as needed (for itching).    vitamin B-12 (CYANOCOBALAMIN) 1000 MCG tablet Take 2,000 mcg by mouth daily.        Today   VITAL SIGNS:  Blood pressure 130/72, pulse 87, temperature 98.2 F (36.8 C), temperature source Oral, resp. rate 18, height 5\' 5"  (1.651 m), weight 79.334 kg (174 lb 14.4 oz), SpO2 90 %.  I/O:   Intake/Output Summary (Last 24 hours) at 04/17/16 1211 Last data filed at 04/17/16 0551  Gross per 24 hour  Intake    700 ml  Output    750 ml  Net    -50 ml    PHYSICAL EXAMINATION:  Physical Exam  GENERAL:  82 y.o.-year-old patient lying in the bed with no acute distress.  LUNGS: Normal breath sounds bilaterally, no wheezing, rales,rhonchi or crepitation. No use of accessory muscles of respiration.  CARDIOVASCULAR: S1, S2 normal. No murmurs, rubs, or gallops.  ABDOMEN: Soft, non-tender, non-distended. Bowel sounds present. No organomegaly or mass.  NEUROLOGIC: Moves all 4 extremities. PSYCHIATRIC: The patient is alert and awake SKIN: No obvious rash, lesion, or ulcer.  Bruising around right eye. Left hip dressing.  DATA REVIEW:   CBC  Recent Labs Lab 04/17/16 0449   WBC 8.7  HGB 11.1*  HCT 32.5*  PLT 177    Chemistries   Recent Labs Lab 04/17/16 0449  NA 138  K 3.6  CL 107  CO2 25  GLUCOSE 186*  BUN 10  CREATININE 0.70  CALCIUM 7.9*    Cardiac Enzymes No results for input(s): TROPONINI in the last 168 hours.  Microbiology Results  Results for orders placed or performed during the hospital encounter of 04/15/16  Surgical pcr screen     Status: Abnormal   Collection Time: 04/16/16  2:24 AM  Result Value Ref Range Status   MRSA, PCR NEGATIVE NEGATIVE Final   Staphylococcus aureus POSITIVE (A) NEGATIVE Final    Comment:        The Xpert SA Assay (  FDA approved for NASAL specimens in patients over 75 years of age), is one component of a comprehensive surveillance program.  Test performance has been validated by [facility] for patients greater than or equal to 32 year old. It is not intended to diagnose infection nor to guide or monitor treatment.     RADIOLOGY:  Dg Chest 1 View  04/15/2016  CLINICAL DATA:  Fall today. Left hip fracture. Pre-op respiratory exam EXAM: CHEST 1 VIEW COMPARISON:  02/18/2016 FINDINGS: Lordotic positioning noted. The heart size is normal. No evidence of mediastinal widening or tracheal deviation. Both lungs are clear. No evidence of pneumothorax. Small to moderate size hiatal hernia again noted. IMPRESSION: No active cardiopulmonary disease. Stable small to moderate hiatal hernia. Electronically Signed   By: Earle Gell M.D.   On: 04/15/2016 20:38   Dg Hip Port Unilat With Pelvis 1v Left  04/16/2016  CLINICAL DATA:  Status post hemiarthroplasty EXAM: DG HIP (WITH OR WITHOUT PELVIS) 1V PORT LEFT COMPARISON:  Yesterday FINDINGS: Left hip bipolar hemiarthroplasty without periprosthetic fracture or subluxation. Intact pelvic ring.  Sacral stimulator on the left. IMPRESSION: No adverse finding after left hip hemiarthroplasty. Electronically Signed   By: Monte Fantasia M.D.   On: 04/16/2016 18:02   Dg Hip  Unilat With Pelvis 2-3 Views Left  04/15/2016  CLINICAL DATA:  82 year old female with history of mechanical fall today. EXAM: DG HIP (WITH OR WITHOUT PELVIS) 2-3V LEFT COMPARISON:  CT of the abdomen and pelvis 04/14/2016. FINDINGS: There is a new subcapital fracture of the left hip which appears foreshortened approximately 10 mm, with mild varus angulation (approximately 10 degrees). Femoral head remains located. Bony pelvis and visualized portions of the right proximal femur appear intact. Residual oral contrast material was noted in the colon and rectum. Sacral nerve stimulator noted projecting over the left buttock region with lead tip terminating in the region of the left hemiMPRESSION: 1. New left femoral neck subcapital fracture, as above. Electronically Signed   By: Vinnie Langton M.D.   On: 04/15/2016 20:38    Follow up with PCP in 1 week.  Management plans discussed with the patient, family and they are in agreement.  CODE STATUS:     Code Status Orders        Start     Ordered   04/16/16 0206  Do not attempt resuscitation (DNR)   Continuous    Question Answer Comment  In the event of cardiac or respiratory ARREST Do not call a "code blue"   In the event of cardiac or respiratory ARREST Do not perform Intubation, CPR, defibrillation or ACLS   In the event of cardiac or respiratory ARREST Use medication by any route, position, wound care, and other measures to relive pain and suffering. May use oxygen, suction and manual treatment of airway obstruction as needed for comfort.      04/16/16 0205    Code Status History    Date Active Date Inactive Code Status Order ID Comments User Context   This patient has a current code status but no historical code status.    Advance Directive Documentation        Most Recent Value   Type of Advance Directive  Living will   Pre-existing out of facility DNR order (yellow form or pink MOST form)     "MOST" Form in Place?        TOTAL  TIME TAKING CARE OF THIS PATIENT ON DAY OF DISCHARGE: more than 30  minutes.   Hillary Bow R M.D on 04/17/2016 at 12:11 PM  Between 7am to 6pm - Pager - [phone]  After 6pm go to [email or web address] - password [email or web address] [facility]  Office  [phone]  CC: Primary care physician; Einar Pheasant, MD  Note: This dictation was prepared with Dragon dictation along with smaller phrase technology. Any transcriptional errors that result from this process are unintentional.

## 2016-04-17 NOTE — Plan of Care (Signed)
Problem: SLP Dysphagia Goals Goal: Misc Dysphagia Goal Pt will safely tolerate po diet of least restrictive consistency w/ no overt s/s of aspiration noted by Staff/pt/family x3 sessions.    

## 2016-04-17 NOTE — Progress Notes (Signed)
Physical Therapy Treatment Patient Details Name: Sandra Kaufman MRN: [id number] DOB: 21-Apr-1932 Today's Date: 04/17/2016    History of Present Illness 82 y/o female here after a fall at home with L hip fracture requiring a hemiarthroplasty    PT Comments    Pt continues to be very limited with what she is able to do with both mobility and exercises.  She is fearful of getting to standing/transfering.  She does not appear to understand most cues, etc but with AAROM exercises she was able to attempt "parroting" the motion. Pt is fearful/pain limited and struggles to do a lot with PT.   Follow Up Recommendations  SNF     Equipment Recommendations   (TBD)    Recommendations for Other Services       Precautions / Restrictions Precautions Precautions: Fall;Posterior Hip Precaution Booklet Issued: Yes (comment) Restrictions LLE Weight Bearing: Weight bearing as tolerated    Mobility  Bed Mobility Overal bed mobility: Needs Assistance Bed Mobility: Sit to Supine     Supine to sit: Max assist Sit to supine: Max assist   General bed mobility comments: Pt not able to assist with getting to supine  Transfers Overall transfer level: Needs assistance Equipment used: Rolling walker (2 wheeled) Transfers: Sit to/from Stand Sit to Stand: Mod assist;Max assist         General transfer comment: Pt again very limited in her ability to follow instructions and be able to do transfers  Ambulation/Gait Ambulation/Gait assistance: Total assist Ambulation Distance (Feet): 3 Feet Assistive device: Rolling walker (2 wheeled)       General Gait Details: Pt unable to take any steps, essentially dependent to get back to the bed from recliner   Stairs            Wheelchair Mobility    Modified Rankin (Stroke Patients Only)       Balance                                    Cognition Arousal/Alertness: Lethargic Behavior During Therapy: Anxious Overall  Cognitive Status: Difficult to assess                      Exercises Total Joint Exercises Ankle Circles/Pumps: AROM;5 reps Quad Sets: Strengthening;10 reps Gluteal Sets: Strengthening;10 reps Heel Slides: 5 reps;AAROM Hip ABduction/ADduction: AAROM;5 reps    General Comments        Pertinent Vitals/Pain Pain Assessment:  (pt reports pain with nearly all movements, not rated)    Home Living Family/patient expects to be discharged to:: Skilled nursing facility Living Arrangements:  (pt has [facility] services 24/7)                  Prior Function Level of Independence:  (unable to answer appropriately, appears she was limited )          PT Goals (current goals can now be found in the care plan section) Acute Rehab PT Goals PT Goal Formulation: Patient unable to participate in goal setting Time For Goal Achievement: 04/17/16 Potential to Achieve Goals: Fair    Frequency  BID    PT Plan Current plan remains appropriate    Co-evaluation             End of Session Equipment Utilized During Treatment: Gait belt Activity Tolerance: Patient limited by pain Patient left: in chair;with call bell/phone within reach;with  nursing/sitter in room     Time: [id number] PT Time Calculation (min) (ACUTE ONLY): 25 min  Charges:  $Therapeutic Exercise: 8-22 mins $Therapeutic Activity: 8-22 mins                    G Codes:     Wayne Both, PT, DPT [phone]  Kreg Shropshire 04/17/2016, 3:56 PM

## 2016-04-17 NOTE — Evaluation (Signed)
Clinical/Bedside Swallow Evaluation Patient Details  Name: Sandra Kaufman MRN: [id number] Date of Birth: [birth year]  Today's Date: 04/17/2016 Time: SLP Start Time (ACUTE ONLY): 68 SLP Stop Time (ACUTE ONLY): 1230 SLP Time Calculation (min) (ACUTE ONLY): 60 min  Past Medical History:  Past Medical History  Diagnosis Date  . Hypertension   . Hypercholesterolemia   . Hypothyroidism   . COPD (chronic obstructive pulmonary disease) ([place])   . GERD (gastroesophageal reflux disease)     with esophageal stricture requiring dilatation x 2 (12/96 and 10/99)  . Arthritis   . Glaucoma   . Peripheral neuropathy ([place])   . History of chicken pox   . Diverticulosis   . Migraines   . Colon polyps   . Urinary incontinence   . Cancer [facility])     skin   Past Surgical History:  Past Surgical History  Procedure Laterality Date  . Abdominal hysterectomy  1961    ovaries not removed  . Tonsillectomy    . Appendectomy    . Bladder tack  1976  . Cataract extraction  1997    bilateral  . Cholecystectomy    . Interstim implant placement  2011   HPI:  Pt is a 82 y.o. female with multiple medical problems including Dementia, Esophageal Strictures and Dilitations x3 per Son, Acid Reflux per Son, hiatal hernia per chart CXR note, COPD, hypertension, hypothyroidism, GERD, and hypercholesterolemia and other medical issues who lives at home BUT w/ 24/7 Caregivers per Son's report. She uses a walker for ambulation. Apparently, she tripped and fell while trying to put her slippers on yesterday evening, landing on her left hip. She was brought to the emergency room where x-rays demonstrated a displaced left femoral neck fracture. Subsequent, she was admitted to the hospitalist service for medical stabilization in preparation for definitive management of her injury. The patient denies any associated injuries resulting from the fall last evening. However, she did fall 2 days earlier and struck the right side of her  face, resulting in ecchymosis around the right eye and forehead region. Patient was with her Caretaker when she was walking outside(pt stated she was "leaving and walking up the street to my other house" when she fell in the street); it was reported that she tripped. Pt stated she struck her head on the concrete. She was evaluated then at [facility] emergency room then discharged after a thorough workup. The patient denies any lightheadedness, dizziness, shortness of breath, or other symptoms that may have precipitated the fall last evening. On a GI note, pt has a h/o Esophageal dysmotility and Esophageal strictures w/ dilitations - Son reported GI did not want to "do another dilitation if he didn't have to" d/t the multiple dilitations done and her age. Son reported pt tends to eat foods (Breads, Meats) she shouldn't w/ the degree of Esphageal dysmotility issues. Pt is on a PPI. Son reported pt has 24/7 Caregivers at home caring for her.    Assessment / Plan / Recommendation Clinical Impression  Pt appears at reduced risk for aspiration d/t prandial aspiration, HOWEVER, pt is at increased risk for aspiration from Esophageal dysmotility and regurgitation. Pt demonstrated no overt s/s of aspiration w/ trials of thin lqiuids, purees, and soft/broken down solids moistened well. Oral phase c/b min. increased time w/ mastication and bolus management b/f swallowing/clearing her mouth. Pt was instructed on alternating foods, moist foods, and liquids to aid clearing; lingual sweeping. Pt fed self; verbal cues and redirection  given. Pt was easily distracted and required reducing distractions during the session(closing door, turning off TV) in order to focus on task of eating/drinking - suspect this is d/t her baseline Dementia. Pt would benefit from a Dys. 2 diet w/ thin liquids; Meds in Puree - crushed as needed for easier swallowing sec. to Cognitive status; Aspiration precautions, strict REFLUX  precautions in order to improve functionality w/ oral diet. Rec. GI f/u for consult sec. to s/s of Esophageal Dysmotility.    Aspiration Risk  Mild aspiration risk (from potential Reflux and Regurgitation)    Diet Recommendation  Dys. 2(moistened foods), thin liquids; strict REFLUX precautions; aspiration precautions  Medication Administration: Whole meds with puree (Crushed as needed/able)    Other  Recommendations Recommended Consults: Consider GI evaluation;Consider esophageal assessment Oral Care Recommendations: Oral care BID;Staff/trained caregiver to provide oral care   Follow up Recommendations  Skilled Nursing facility (TBD)    Frequency and Duration min 2x/week  1 week       Prognosis Prognosis for Safe Diet Advancement: Fair (-Good) Barriers to Reach Goals: Cognitive deficits (GI Esophageal dysmotility)      Swallow Study   General Date of Onset: 04/15/16 HPI: Pt is a 82 y.o. female with multiple medical problems including Dementia, Esophageal Strictures and Dilitations x3 per Son, Acid Reflux per Son, hiatal hernia per chart CXR note, COPD, hypertension, hypothyroidism, GERD, and hypercholesterolemia and other medical issues who lives at home BUT w/ 24/7 Caregivers per Son's report. She uses a walker for ambulation. Apparently, she tripped and fell while trying to put her slippers on yesterday evening, landing on her left hip. She was brought to the emergency room where x-rays demonstrated a displaced left femoral neck fracture. Subsequent, she was admitted to the hospitalist service for medical stabilization in preparation for definitive management of her injury. The patient denies any associated injuries resulting from the fall last evening. However, she did fall 2 days earlier and struck the right side of her face, resulting in ecchymosis around the right eye and forehead region. Patient was with her Caretaker when she was walking outside(pt stated she was "leaving and  walking up the street to my other house" when she fell in the street); it was reported that she tripped. Pt stated she struck her head on the concrete. She was evaluated then at [facility] emergency room then discharged after a thorough workup. The patient denies any lightheadedness, dizziness, shortness of breath, or other symptoms that may have precipitated the fall last evening. On a GI note, pt has a h/o Esophageal dysmotility and Esophageal strictures w/ dilitations - Son reported GI did not want to "do another dilitation if he didn't have to" d/t the multiple dilitations done and her age. Son reported pt tends to eat foods (Breads, Meats) she shouldn't w/ the degree of Esphageal dysmotility issues. Pt is on a PPI. Son reported pt has 24/7 Caregivers at home caring for her.  Type of Study: Bedside Swallow Evaluation Previous Swallow Assessment: GI assessments per Son Diet Prior to this Study: Regular;Thin liquids Temperature Spikes Noted: No (wbc 8.7) Respiratory Status: Nasal cannula (2 liters) History of Recent Intubation: No Behavior/Cognition: Alert;Cooperative;Pleasant mood;Confused;Distractible;Requires cueing Oral Cavity Assessment: Within Functional Limits Oral Care Completed by SLP: Yes Oral Cavity - Dentition: Adequate natural dentition (missing few) Vision: Functional for self-feeding Self-Feeding Abilities: Able to feed self;Needs assist;Needs set up Patient Positioning: Upright in chair Baseline Vocal Quality: Normal;Low vocal intensity Volitional Cough: Strong Volitional Swallow: Able  to elicit    Oral/Motor/Sensory Function Overall Oral Motor/Sensory Function: Within functional limits   Ice Chips Ice chips: Within functional limits Presentation: Spoon (fed; 3 trials)   Thin Liquid Thin Liquid: Within functional limits Presentation: Cup;Self Fed;Straw (5-6 trials via cup, then straw (each))    Nectar Thick Nectar Thick Liquid: Not tested   Honey  Thick Honey Thick Liquid: Not tested   Puree Puree: Within functional limits Presentation: Self Fed;Spoon (3-4 trials)   Solid   GO   Solid: Within functional limits (cut down well and moistened) Presentation: Self Fed;Spoon (8+ trials)       Orinda Kenner, MS, CCC-SLP  Watson,Katherine 04/17/2016,2:01 PM

## 2016-04-17 NOTE — Discharge Instructions (Addendum)
DIET:  Recommended Diet for Discharge: Dysphagia 1(puree) w/ thin liquids - moisten well w/ Condiments; strict REFLUX precautions; general aspiration precautions.  Nutritional Drink Supplement per Dietician (Ensure, Boost, etc).  DISCHARGE CONDITION:  Stable  ACTIVITY:  Activity as tolerated as per Orthopedics.  OXYGEN:  Home Oxygen: No.   Oxygen Delivery: room air  DISCHARGE LOCATION:  nursing home   If you experience worsening of your admission symptoms, develop shortness of breath, life threatening emergency, suicidal or homicidal thoughts you must seek medical attention immediately by calling 911 or calling your MD immediately  if symptoms less severe.  You Must read complete instructions/literature along with all the possible adverse reactions/side effects for all the Medicines you take and that have been prescribed to you. Take any new Medicines after you have completely understood and accpet all the possible adverse reactions/side effects.   Please note  You were cared for by a hospitalist during your hospital stay. If you have any questions about your discharge medications or the care you received while you were in the hospital after you are discharged, you can call the unit and asked to speak with the hospitalist on call if the hospitalist that took care of you is not available. Once you are discharged, your primary care physician will handle any further medical issues. Please note that NO REFILLS for any discharge medications will be authorized once you are discharged, as it is imperative that you return to your primary care physician (or establish a relationship with a primary care physician if you do not have one) for your aftercare needs so that they can reassess your need for medications and monitor your lab values.   POSTERIOR TOTAL HIP REPLACEMENT POSTOPERATIVE DIRECTIONS  Hip Rehabilitation, Guidelines Following Surgery  The results of a hip operation are greatly  improved after range of motion and muscle strengthening exercises. Follow all safety measures which are given to protect your hip. If any of these exercises cause increased pain or swelling in your joint, decrease the amount until you are comfortable again. Then slowly increase the exercises. Call your caregiver if you have problems or questions.   HOME CARE INSTRUCTIONS  Remove items at home which could result in a fall. This includes throw rugs or furniture in walking pathways.   ICE to the affected hip every three hours for 30 minutes at a time and then as needed for pain and swelling.  Continue to use ice on the hip for pain and swelling from surgery. You may notice swelling that will progress down to the foot and ankle.  This is normal after surgery.  Elevate the leg when you are not up walking on it.    Continue to use the breathing machine which will help keep your temperature down.  It is common for your temperature to cycle up and down following surgery, especially at night when you are not up moving around and exerting yourself.  The breathing machine keeps your lungs expanded and your temperature down.   DRESSING / WOUND CARE / SHOWERING Keep the surgical dressing until follow up.  The dressing is water proof, so you can shower without any extra covering.  IF THE DRESSING FALLS OFF or the wound gets wet inside, change the dressing with sterile gauze.  Please use good hand washing techniques before changing the dressing.  Do not use any lotions or creams on the incision until instructed by your surgeon.   You need to keep your dressing dry after discharge.  Change the surgical dressing if needed with Physical Therapy and reapply a dry dressing each time.    ACTIVITY Walk with your walker as instructed. Use walker as long as suggested by your caregivers. Avoid periods of inactivity such as sitting longer than an hour when not asleep. This helps prevent blood clots.  You may resume a  sexual relationship in one month or when given the OK by your doctor.  You may return to work once you are cleared by your doctor.  Do not drive a car for 6 weeks or until released by you surgeon.  Do not drive while taking narcotics.  WEIGHT BEARING Weight bearing as tolerated with assist device (walker, cane, etc) as directed, use it as long as suggested by your surgeon or therapist, typically at least 4-6 weeks.  POSTOPERATIVE CONSTIPATION PROTOCOL Constipation - defined medically as fewer than three stools per week and severe constipation as less than one stool per week.  One of the most common issues patients have following surgery is constipation.  Even if you have a regular bowel pattern at home, your normal regimen is likely to be disrupted due to multiple reasons following surgery.  Combination of anesthesia, postoperative narcotics, change in appetite and fluid intake all can affect your bowels.  In order to avoid complications following surgery, here are some recommendations in order to help you during your recovery period.  Colace (docusate) - Pick up an over-the-counter form of Colace or another stool softener and take twice a day as long as you are requiring postoperative pain medications.  Take with a full glass of water daily.  If you experience loose stools or diarrhea, hold the colace until you stool forms back up.  If your symptoms do not get better within 1 week or if they get worse, check with your doctor.  Dulcolax (bisacodyl) - Pick up over-the-counter and take as directed by the product packaging as needed to assist with the movement of your bowels.  Take with a full glass of water.  Use this product as needed if not relieved by Colace only.   MiraLax (polyethylene glycol) - Pick up over-the-counter to have on hand.  MiraLax is a solution that will increase the amount of water in your bowels to assist with bowel movements.  Take as directed and can mix with a glass of water,  juice, soda, coffee, or tea.  Take if you go more than two days without a movement. Do not use MiraLax more than once per day. Call your doctor if you are still constipated or irregular after using this medication for 7 days in a row.  If you continue to have problems with postoperative constipation, please contact the office for further assistance and recommendations.  If you experience "the worst abdominal pain ever" or develop nausea or vomiting, please contact the office immediatly for further recommendations for treatment.  ITCHING  If you experience itching with your medications, try taking only a single pain pill, or even half a pain pill at a time.  You can also use Benadryl over the counter for itching or also to help with sleep.   TED HOSE STOCKINGS Wear the elastic stockings on both legs for three weeks following surgery during the day but you may remove then at night for sleeping.  MEDICATIONS See your medication summary on the After Visit Summary that the nursing staff will review with you prior to discharge.  You may have some home medications which will be placed  on hold until you complete the course of blood thinner medication.  It is important for you to complete the blood thinner medication as prescribed by your surgeon.  Continue your approved medications as instructed at time of discharge.  PRECAUTIONS If you experience chest pain or shortness of breath - call 911 immediately for transfer to the hospital emergency department.  If you develop a fever greater that 101 F, purulent drainage from wound, increased redness or drainage from wound, foul odor from the wound/dressing, or calf pain - CONTACT YOUR SURGEON.                                                   FOLLOW-UP APPOINTMENTS Make sure you keep all of your appointments after your operation with your surgeon and caregivers. You should call the office at the above phone number and make an appointment for approximately two  weeks after the date of your surgery or on the date instructed by your surgeon outlined in the "After Visit Summary".  RANGE OF MOTION AND STRENGTHENING EXERCISES  These exercises are designed to help you keep full movement of your hip joint. Follow your caregiver's or physical therapist's instructions. Perform all exercises about fifteen times, three times per day or as directed. Exercise both hips, even if you have had only one joint replacement. These exercises can be done on a training (exercise) mat, on the floor, on a table or on a bed. Use whatever works the best and is most comfortable for you. Use music or television while you are exercising so that the exercises are a pleasant break in your day. This will make your life better with the exercises acting as a break in routine you can look forward to.  Lying on your back, slowly slide your foot toward your buttocks, raising your knee up off the floor. Then slowly slide your foot back down until your leg is straight again.  Lying on your back spread your legs as far apart as you can without causing discomfort.  Lying on your side, raise your upper leg and foot straight up from the floor as far as is comfortable. Slowly lower the leg and repeat.  Lying on your back, tighten up the muscle in the front of your thigh (quadriceps muscles). You can do this by keeping your leg straight and trying to raise your heel off the floor. This helps strengthen the largest muscle supporting your knee.  Lying on your back, tighten up the muscles of your buttocks both with the legs straight and with the knee bent at a comfortable angle while keeping your heel on the floor.      IF YOU ARE TRANSFERRED TO A SKILLED REHAB FACILITY If the patient is transferred to a skilled rehab facility following release from the hospital, a list of the current medications will be sent to the facility for the patient to continue.  When discharged from the skilled rehab facility,  please have the facility set up the patient's [place] prior to being released. Also, the skilled facility will be responsible for providing the patient with their medications at time of release from the facility to include their pain medication, the muscle relaxants, and their blood thinner medication. If the patient is still at the rehab facility at time of the two week follow up appointment,  the skilled rehab facility will also need to assist the patient in arranging follow up appointment in our office and any transportation needs.  MAKE SURE YOU:  Understand these instructions.  Get help right away if you are not doing well or get worse.    Pick up stool softner and laxative for home use following surgery while on pain medications. Do not submerge incision under water. Please use good hand washing techniques while changing dressing each day. May shower starting three days after surgery. Please use a clean towel to pat the incision dry following showers. Continue to use ice for pain and swelling after surgery. Do not use any lotions or creams on the incision until instructed by your surgeon.

## 2016-04-17 NOTE — Progress Notes (Signed)
[facility] [place] NAME: Sandra Kaufman    MR#:  [id number]  DATE OF BIRTH:  [birth year]  SUBJECTIVE:  CHIEF COMPLAINT:   Chief Complaint  Patient presents with  . Fall   S/p surgery for left hip fracture. No Concerns. Worked with physical therapy and up in a recliner today.  REVIEW OF SYSTEMS:    Review of Systems  Constitutional: Positive for malaise/fatigue. Negative for fever and chills.  HENT: Negative for sore throat.   Eyes: Negative for blurred vision, double vision and pain.  Respiratory: Negative for cough, hemoptysis, shortness of breath and wheezing.   Cardiovascular: Negative for chest pain, palpitations, orthopnea and leg swelling.  Gastrointestinal: Negative for heartburn, nausea, vomiting, abdominal pain, diarrhea and constipation.  Genitourinary: Negative for dysuria and hematuria.  Musculoskeletal: Positive for back pain and joint pain.  Skin: Negative for rash.  Neurological: Negative for sensory change, speech change, focal weakness and headaches.  Endo/Heme/Allergies: Does not bruise/bleed easily.  Psychiatric/Behavioral: Negative for depression. The patient is not nervous/anxious.    Poor historian DRUG ALLERGIES:   Allergies  Allergen Reactions  . Tramadol Nausea And Vomiting    VITALS:  Blood pressure 130/72, pulse 87, temperature 98.2 F (36.8 C), temperature source Oral, resp. rate 18, height 5\' 5"  (1.651 m), weight 79.334 kg (174 lb 14.4 oz), SpO2 90 %.  PHYSICAL EXAMINATION:   Physical Exam  GENERAL:  82 y.o.-year-old patient sitting in a recliner with no acute distress.  EYES: Pupils equal, round, reactive to light and accommodation. No scleral icterus. Extraocular muscles intact.  HEENT: Head atraumatic, normocephalic. Oropharynx and nasopharynx clear.  NECK:  Supple, no jugular venous distention. No thyroid enlargement, no tenderness.  LUNGS: Normal breath sounds bilaterally, no  wheezing, rales, rhonchi. No use of accessory muscles of respiration.  CARDIOVASCULAR: S1, S2 normal. No murmurs, rubs, or gallops.  ABDOMEN: Soft, nontender, nondistended. Bowel sounds present. No organomegaly or mass.  EXTREMITIES: No cyanosis, clubbing or edema b/l.   Left hip dressing with mild tenderness. No bleeding NEUROLOGIC: Cranial nerves II through XII are intact. No focal Motor or sensory deficits b/l.   PSYCHIATRIC: The patient is alert and awake SKIN: No obvious rash, lesion, or ulcer.  Bruising around her right eye from prior fall  LABORATORY PANEL:   CBC  Recent Labs Lab 04/17/16 0449  WBC 8.7  HGB 11.1*  HCT 32.5*  PLT 177   ------------------------------------------------------------------------------------------------------------------ Chemistries   Recent Labs Lab 04/17/16 0449  NA 138  K 3.6  CL 107  CO2 25  GLUCOSE 186*  BUN 10  CREATININE 0.70  CALCIUM 7.9*   ------------------------------------------------------------------------------------------------------------------  Cardiac Enzymes No results for input(s): TROPONINI in the last 168 hours. ------------------------------------------------------------------------------------------------------------------  RADIOLOGY:  Dg Chest 1 View  04/15/2016  CLINICAL DATA:  Fall today. Left hip fracture. Pre-op respiratory exam EXAM: CHEST 1 VIEW COMPARISON:  02/18/2016 FINDINGS: Lordotic positioning noted. The heart size is normal. No evidence of mediastinal widening or tracheal deviation. Both lungs are clear. No evidence of pneumothorax. Small to moderate size hiatal hernia again noted. IMPRESSION: No active cardiopulmonary disease. Stable small to moderate hiatal hernia. Electronically Signed   By: Earle Gell M.D.   On: 04/15/2016 20:38   Dg Hip Port Unilat With Pelvis 1v Left  04/16/2016  CLINICAL DATA:  Status post hemiarthroplasty EXAM: DG HIP (WITH OR WITHOUT PELVIS) 1V PORT LEFT COMPARISON:   Yesterday FINDINGS: Left hip bipolar hemiarthroplasty without periprosthetic fracture or  subluxation. Intact pelvic ring.  Sacral stimulator on the left. IMPRESSION: No adverse finding after left hip hemiarthroplasty. Electronically Signed   By: Monte Fantasia M.D.   On: 04/16/2016 18:02   Dg Hip Unilat With Pelvis 2-3 Views Left  04/15/2016  CLINICAL DATA:  82 year old female with history of mechanical fall today. EXAM: DG HIP (WITH OR WITHOUT PELVIS) 2-3V LEFT COMPARISON:  CT of the abdomen and pelvis 04/14/2016. FINDINGS: There is a new subcapital fracture of the left hip which appears foreshortened approximately 10 mm, with mild varus angulation (approximately 10 degrees). Femoral head remains located. Bony pelvis and visualized portions of the right proximal femur appear intact. Residual oral contrast material was noted in the colon and rectum. Sacral nerve stimulator noted projecting over the left buttock region with lead tip terminating in the region of the left hemiMPRESSION: 1. New left femoral neck subcapital fracture, as above. Electronically Signed   By: Vinnie Langton M.D.   On: 04/15/2016 20:38     ASSESSMENT AND PLAN:   * Left hip fracture POD - 1 On Pain meds and Lovenox. Hb stable Up in a chair today. Likely d/c 1-2 days  * DM SSI Home meds  * UTI Cultures were not sent. We'll treat empirically with IV ceftriaxone. Change to ciprofloxacin at discharge  * DVT prophylaxis on Lovenox  All the records are reviewed and case discussed with Care Management/Social Workerr. Management plans discussed with the patient, family and they are in agreement.  CODE STATUS: DNR  DVT Prophylaxis: SCDs  TOTAL TIME TAKING CARE OF THIS PATIENT: 40 minutes.   POSSIBLE D/C IN 1-2 DAYS, DEPENDING ON CLINICAL CONDITION.  Hillary Bow R M.D on 04/17/2016 at 12:00 PM  Between 7am to 6pm - Pager - [phone]  After 6pm go to [email or web address] - password [email or web address]  Office  [phone]  CC: Primary care physician; Einar Pheasant, MD  Note: This dictation was prepared with Dragon dictation along with smaller phrase technology. Any transcriptional errors that result from this process are unintentional.

## 2016-04-17 NOTE — Clinical Social Work Placement (Signed)
   CLINICAL SOCIAL WORK PLACEMENT  NOTE  Date:  04/17/2016  Patient Details  Name: Sandra Kaufman MRN: [id number] Date of Birth: 24-Mar-1932  Clinical Social Work is seeking post-discharge placement for this patient at the [place] level of care (*CSW will initial, date and re-position this form in  chart as items are completed):  Yes   Patient/family provided with [facility] Work Department's list of facilities offering this level of care within the geographic area requested by the patient (or if unable, by the patient's family).  Yes   Patient/family informed of their freedom to choose among providers that offer the needed level of care, that participate in Medicare, Medicaid or managed care program needed by the patient, have an available bed and are willing to accept the patient.  Yes   Patient/family informed of [facility]'s ownership interest in [facility] and [facility], as well as of the fact that they are under no obligation to receive care at these facilities.  PASRR submitted to EDS on       PASRR number received on       Existing PASRR number confirmed on 04/17/16     FL2 transmitted to all facilities in geographic area requested by pt/family on 04/17/16     FL2 transmitted to all facilities within larger geographic area on       Patient informed that his/her managed care company has contracts with or will negotiate with certain facilities, including the following:        Yes   Patient/family informed of bed offers received.  Patient chooses bed at  [facility] )     Physician recommends and patient chooses bed at      Patient to be transferred to   on  .  Patient to be transferred to facility by       Patient family notified on   of transfer.  Name of family member notified:        PHYSICIAN       Additional Comment:    _______________________________________________ Loralyn Freshwater, LCSW 04/17/2016, 4:14  PM

## 2016-04-17 NOTE — Progress Notes (Signed)
Pt seen by ST. Pt given diet accordingly. Supervised pt while eating. Pt had frequent vigorous coughing and eventually vomited meal. Pt made npo and MD notified. Orders recd. Will cont to monitor.

## 2016-04-17 NOTE — Care Management Note (Signed)
Case Management Note  Patient Details  Name: Sandra Kaufman MRN: [id number] Date of Birth: 1932/05/02  Subjective/Objective:                  Met with patient this morning to discuss discharge planning. She states she is from home alone. She has a son named [name] that lives close by however she depends on a female friend for transportation. She indicated that she needs a walker to walk but "fell in the street". Her PCP is Dr. Nicki Reaper. She not on O2 at home. She uses [facility] but couldn't state which one. PT is recommending SNF.   Action/Plan: List of home health agencies left with patient.   Expected Discharge Date:                  Expected Discharge Plan:     In-House Referral:     Discharge planning Services  CM Consult  Post Acute Care Choice:  Home Health Choice offered to:  Patient  DME Arranged:    DME Agency:     HH Arranged:    [place] Agency:     Status of Service:  In process, will continue to follow  Medicare Important Message Given:    Date Medicare IM Given:    Medicare IM give by:    Date Additional Medicare IM Given:    Additional Medicare Important Message give by:     If discussed at [place] of Stay Meetings, dates discussed:    Additional Comments:  Marshell Garfinkel, RN 04/17/2016, 10:08 AM

## 2016-04-18 LAB — GLUCOSE, CAPILLARY
Glucose-Capillary: 141 mg/dL — ABNORMAL HIGH (ref 65–99)
Glucose-Capillary: 153 mg/dL — ABNORMAL HIGH (ref 65–99)
Glucose-Capillary: 135 mg/dL — ABNORMAL HIGH (ref 65–99)
Glucose-Capillary: 128 mg/dL — ABNORMAL HIGH (ref 65–99)

## 2016-04-18 LAB — HEMOGLOBIN: Hemoglobin: 10.9 g/dL — ABNORMAL LOW (ref 12.0–16.0)

## 2016-04-18 MED ORDER — METOPROLOL TARTRATE 25 MG PO TABS
25.0000 mg | ORAL_TABLET | Freq: Two times a day (BID) | ORAL | Status: DC
  Administered 2016-04-18 – 2016-04-23 (×11): 25 mg via ORAL
  Filled 2016-04-18 (×11): qty 1

## 2016-04-18 NOTE — Progress Notes (Signed)
Speech Language Pathology Dysphagia Treatment Patient Details Name: Sandra Kaufman MRN: [id number] DOB: 22-Jan-1932 Today's Date: 04/18/2016 Time: [id number] SLP Time Calculation (min) (ACUTE ONLY): 23 min  Assessment / Plan / Recommendation Clinical Impression   pt presents with esophageal dysphagia characterized by slow esophageal motility and a noted hiatal hernia impacting pt ability to digest foods. SLP administered trials of puree and soft solid trials with pt requiring some cues for slow rate of intake and small sips. Pt intake multiple sips via straw with st cues for decreased rate, slp does not recommend the use of straws at this time to control rate of oral intake of liquids, pt had wet vocal quality and belching noted post intake of multiple sips via straw. Marland Kitchen SLP recommends dysphagia 1 with thin via cup no straw to decrease risk of aspiration and regurgitation of solids. St educated pt on intake recommendations and diet. Pt able to give verbal agreement and provide return demonstration, however st guarded on pt ability to recall inforrmation/ education. Pt should continue to follow up with GI for hiatal hernia management.     Diet Recommendation    Dysphagia 1 with thin via cup, no straw   SLP Plan Continue with current plan of care   Pertinent Vitals/Pain No pain reported.    Swallowing Goals     General Behavior/Cognition: Alert;Confused Patient Positioning: Upright in bed Oral care provided: N/A HPI: Pt is a 82 y.o. female with multiple medical problems including Dementia, Esophageal Strictures and Dilitations x3 per Son, Acid Reflux per Son, hiatal hernia per chart CXR note, COPD, hypertension, hypothyroidism, GERD, and hypercholesterolemia and other medical issues who lives at home BUT w/ 24/7 Caregivers per Son's report. She uses a walker for ambulation. Apparently, she tripped and fell while trying to put her slippers on yesterday evening, landing on her left hip. She was  brought to the emergency room where x-rays demonstrated a displaced left femoral neck fracture. Subsequent, she was admitted to the hospitalist service for medical stabilization in preparation for definitive management of her injury. The patient denies any associated injuries resulting from the fall last evening. However, she did fall 2 days earlier and struck the right side of her face, resulting in ecchymosis around the right eye and forehead region. Patient was with her Caretaker when she was walking outside(pt stated she was "leaving and walking up the street to my other house" when she fell in the street); it was reported that she tripped. Pt stated she struck her head on the concrete. She was evaluated then at [facility] emergency room then discharged after a thorough workup. The patient denies any lightheadedness, dizziness, shortness of breath, or other symptoms that may have precipitated the fall last evening. On a GI note, pt has a h/o Esophageal dysmotility and Esophageal strictures w/ dilitations - Son reported GI did not want to "do another dilitation if he didn't have to" d/t the multiple dilitations done and her age. Son reported pt tends to eat foods (Breads, Meats) she shouldn't w/ the degree of Esphageal dysmotility issues. Pt is on a PPI. Son reported pt has 24/7 Caregivers at home caring for her.   Oral Cavity - Oral Hygiene Does patient have any of the following "at risk" factors?: Oxygen therapy - cannula, mask, simple oxygen devices   Dysphagia Treatment Treatment Methods: Skilled observation;Upgraded PO texture trial;Patient/caregiver education Patient observed directly with PO's: Yes Type of PO's observed: Dysphagia 1 (puree);Thin liquids Feeding: Needs assist Liquids  provided via: Teaspoon;Cup;Straw Pharyngeal Phase Signs & Symptoms: Multiple swallows;Wet vocal quality;Immediate cough Type of cueing: Verbal Amount of cueing: Minimal   GO     Sandra Kaufman 04/18/2016, 10:55 AM

## 2016-04-18 NOTE — Consult Note (Signed)
GI Consultation Note  Referring Provider: Gladstone Lighter, MD Date of Consult: 04/18/2016  HPI: Sandra Kaufman is a 82 y.o. female being seen in consultation at the request of Gladstone Lighter, MD for dysphagia.  Sandra Kaufman is a 82 y.o. currently hospitalized with a fracture who has a history of hiatal hernia and dysphagia. She has some evidence of delirium on exam today, thus her interview is somewhat limited. She does report that intermittently when she tries to swallow she has difficulty with solids and with liquids, noting that "sometimes it works and sometimes it doesn't". She is currently attempting to eat applesauce, which she notes she can swallow without difficulty. She has been seen by speech pathology during this admission who has documented slow esophageal motility, and SLP has recommended a dysphagia 1 diet with thin liquids via a cup with no straws to decrease the risk of aspiration and regurgitation.    In 09/2014, the pt underwent EGD where a small hiatal hernia was noted as well as a moderate, benign appearing intrinsic stenosis. This was dilated with a TTS 12-13.5-15 balloon, and retreatment was recommended on an as needed basis.   PMH: Past Medical History  Diagnosis Date  . Hypertension   . Hypercholesterolemia   . Hypothyroidism   . COPD (chronic obstructive pulmonary disease) ([place])   . GERD (gastroesophageal reflux disease)     with esophageal stricture requiring dilatation x 2 (12/96 and 10/99)  . Arthritis   . Glaucoma   . Peripheral neuropathy ([place])   . History of chicken pox   . Diverticulosis   . Migraines   . Colon polyps   . Urinary incontinence   . Cancer ([place])     skin   PSH: Past Surgical History  Procedure Laterality Date  . Abdominal hysterectomy  1961    ovaries not removed  . Tonsillectomy    . Appendectomy    . Bladder tack  1976  . Cataract extraction  1997    bilateral  . Cholecystectomy    . Interstim implant placement  2011   . Hip arthroplasty Left 04/16/2016    Procedure: ARTHROPLASTY BIPOLAR HIP (HEMIARTHROPLASTY);  Surgeon: Corky Mull, MD;  Location: [facility] ORS;  Service: Orthopedics;  Laterality: Left;    Family History: Family History  Problem Relation Age of Onset  . Heart disease Father     myocardial infarction  . Arthritis/Rheumatoid Mother   . Diabetes Sister   . Epilepsy Sister   . Breast cancer Neg Hx   . Colon cancer Neg Hx     Social History: Social History   Social History  . Marital Status: Widowed    Spouse Name: N/A  . Number of Children: 2  . Years of Education: N/A   Occupational History  . Not on file.   Social History Main Topics  . Smoking status: Never Smoker   . Smokeless tobacco: Never Used  . Alcohol Use: No  . Drug Use: No  . Sexual Activity: Not on file   Other Topics Concern  . Not on file   Social History Narrative    ROS: The balance of a 12 system review is negative other than as described in the HPI.  SCHEDULED MEDS: . aspirin  325 mg Oral Daily  . atorvastatin  40 mg Oral QHS  . brinzolamide  1 drop Both Eyes BID  . cefTRIAXone (ROCEPHIN)  IV  1 g Intravenous Q24H  . docusate sodium  100 mg Oral  BID  . enoxaparin (LOVENOX) injection  40 mg Subcutaneous Q24H  . insulin aspart  0-15 Units Subcutaneous TID WC  . latanoprost  1 drop Both Eyes QHS  . levothyroxine  100 mcg Oral QAC breakfast  . memantine  10 mg Oral BID  . metoprolol tartrate  25 mg Oral BID  . pantoprazole  40 mg Oral Daily  . sertraline  100 mg Oral Daily  . sodium chloride flush  3 mL Intravenous Q12H    PHYSICAL EXAM: Filed Vitals:   04/18/16 1045 04/18/16 1050  BP:    Pulse: 101 98  Temp:    Resp:      GEN: Alert, oriented to person and place, in no apparent distress. The pt repeatedly tries to feed applesauce to someone who is not present in the room during the exam. Bruising over the face. HEENT: Oropharynx clear. Anicteric CV: Nl rate, nl rhythm. No murmurs,  rubs or gallops. LUNGS: Clear to auscultation bilaterally. No wheezes, rales or rhonchi. ABD: Bowel sounds present. Abdomen soft, nontender, nondistended. NEURO: no focal neurologic deficits other than as noted above.  LABS: CBC Latest Ref Rng 04/18/2016 04/17/2016 04/16/2016  WBC 3.6 - 11.0 K/uL - 8.7 9.0  Hemoglobin 12.0 - 16.0 g/dL 10.9(L) 11.1(L) 11.8(L)  Hematocrit 35.0 - 47.0 % - 32.5(L) 34.3(L)  Platelets 150 - 440 K/uL - 177 178    BMP Latest Ref Rng 04/17/2016 04/16/2016 04/15/2016  Glucose 65 - 99 mg/dL 186(H) 181(H) 159(H)  BUN 6 - 20 mg/dL 10 13 15   Creatinine 0.44 - 1.00 mg/dL 0.70 0.66 0.71  Sodium 135 - 145 mmol/L 138 140 141  Potassium 3.5 - 5.1 mmol/L 3.6 3.2(L) 3.2(L)  Chloride 101 - 111 mmol/L 107 108 108  CO2 22 - 32 mmol/L 25 27 24   Calcium 8.9 - 10.3 mg/dL 7.9(L) 8.5(L) 8.8(L)    Hepatic Function Latest Ref Rng 03/17/2016 01/21/2016 11/18/2015  Total Protein 6.0 - 8.3 g/dL 7.3 6.9 7.8  Albumin 3.5 - 5.2 g/dL 4.1 4.0 4.3  AST 0 - 37 U/L 13 16 29   ALT 0 - 35 U/L 11 16 24   Alk Phosphatase 39 - 117 U/L 83 88 82  Total Bilirubin 0.2 - 1.2 mg/dL 1.0 1.1 1.3(H)  Bilirubin, Direct 0.0 - 0.3 mg/dL - 0.2 -   IMAGING: EXAM: ESOPHOGRAM/BARIUM SWALLOW  TECHNIQUE: Single contrast examination was performed using thin barium and water.  FLUOROSCOPY TIME: Fluoroscopy Time: 1 minutes, 42 seconds  Number of Acquired Images: 10+ 2 video sequences.  COMPARISON: Abdominal and pelvic CT scan dated April 14, 2016  FINDINGS: The patient ingests barium and water without difficulty in the semi upright position. There is no laryngeal penetration of the barium. There are changes of presbyesophagus with a near corkscrew appearance with numerous tertiary contractions. There is a large hiatal hernia -partially intra thoracic stomach. This fills with the barium and intermittently empties through a fairly narrow channel into the subdiaphragmatic portion of the stomach.  There is subtle twisting of the gastric mucosal folds as they pass through the diaphragmatic hiatus and mild volvulization cannot be excluded.  IMPRESSION: 1. Presbyesophagus without evidence of significant stricturing. 2. Large hiatal hernia with fairly small passage through the esophageal hiatus into the subdiaphragmatic portion of the stomach. This allowed passage of the liquid barium but solid foods would be problematic. Possible intermittent mild volvulization of the hiatal hernia. Direct visualization would be useful if the patient can tolerate the procedure.   ASSESSMENT: Sandra Kaufman  is a 82 y.o. female being seen for dysphagia. The pt does have a history of esophageal stricture, but more acutely her dysphagia has been treated with dietary modifications as recommended by speech pathology. At this time, I would recommend continuing dietary therapies suggested by speech pathology. As she recovers from her acute injury and her delirium, more acute issues improve, we can consider repeat EGD as the hiatal hernia is described as large on imaging yesterday (4/211), with a question of mild volvulization of the hiatal hernia. I wonder if she also has re-developed a Schatzki ring (which was previously balloon dilated), and this is what is represented as the "small passage through the esophageal hiatus".  We will monitor through the weekend and if she remains in house, and continues to improve, this could be performed as an inpatient or after discharge as she continues to recover.  RECOMMENDATIONS: - continue dietary modifications as prescribed by speech therapy - continue to monitor dysphagia - consider repeat EGD for better characterization of hiatal hernia and ? Schatzki ring vs other stricture at GE junction as noted above  Shaquille Janes L. Drema Dallas, MD, MPH

## 2016-04-18 NOTE — Progress Notes (Signed)
  Subjective: 2 Days Post-Op Procedure(s) (LRB): ARTHROPLASTY BIPOLAR HIP (HEMIARTHROPLASTY) (Left) Patient reports pain as mild.   Patient seen in rounds with Dr. Roland Rack. Patient is well, and has had no acute complaints or problems. The patient is mildly confused. Plan is to go Skilled nursing facility after hospital stay. Negative for chest pain and shortness of breath Fever: no Gastrointestinal: Negative for nausea and vomiting  Objective: Vital signs in last 24 hours: Temp:  [98.2 F (36.8 C)-100.3 F (37.9 C)] 99.4 F (37.4 C) (04/22 0326) Pulse Rate:  [87-120] 110 (04/22 0326) Resp:  [16-20] 18 (04/22 0326) BP: (130-176)/(64-95) 149/94 mmHg (04/22 0326) SpO2:  [86 %-97 %] 94 % (04/22 0326)  Intake/Output from previous day:  Intake/Output Summary (Last 24 hours) at 04/18/16 0617 Last data filed at 04/17/16 1230  Gross per 24 hour  Intake 1388.75 ml  Output      0 ml  Net 1388.75 ml    Intake/Output this shift:    Labs:  Recent Labs  04/15/16 2024 04/16/16 0458 04/17/16 0449 04/18/16 0353  HGB 12.7 11.8* 11.1* 10.9*    Recent Labs  04/16/16 0458 04/17/16 0449  WBC 9.0 8.7  RBC 4.02 3.76*  HCT 34.3* 32.5*  PLT 178 177    Recent Labs  04/16/16 0458 04/17/16 0449  NA 140 138  K 3.2* 3.6  CL 108 107  CO2 27 25  BUN 13 10  CREATININE 0.66 0.70  GLUCOSE 181* 186*  CALCIUM 8.5* 7.9*   No results for input(s): LABPT, INR in the last 72 hours.   EXAM General - Patient is Alert and Confused Extremity - Sensation intact distally Dorsiflexion/Plantar flexion intact. The compartments are soft with no signs of cellulitis. Dressing/Incision - clean, dry, blood tinged and scant drainage Motor Function - intact, moving foot and toes well on exam. The patient ambulated 3 feet with physical therapy.  Past Medical History  Diagnosis Date  . Hypertension   . Hypercholesterolemia   . Hypothyroidism   . COPD (chronic obstructive pulmonary disease) ([place])    . GERD (gastroesophageal reflux disease)     with esophageal stricture requiring dilatation x 2 (12/96 and 10/99)  . Arthritis   . Glaucoma   . Peripheral neuropathy ([place])   . History of chicken pox   . Diverticulosis   . Migraines   . Colon polyps   . Urinary incontinence   . Cancer ([place])     skin    Assessment/Plan: 2 Days Post-Op Procedure(s) (LRB): ARTHROPLASTY BIPOLAR HIP (HEMIARTHROPLASTY) (Left) Principal Problem:   Fracture of femoral neck, left (HCC) Active Problems:   Hypothyroidism   Hypercholesterolemia   Diabetes mellitus (HCC)   Hypertension   GERD (gastroesophageal reflux disease)   Chronic obstructive pulmonary disease (HCC)   Fracture, femur, neck, left, closed, initial encounter  Estimated body mass index is 29.1 kg/(m^2) as calculated from the following:   Height as of this encounter: 5\' 5"  (1.651 m).   Weight as of this encounter: 79.334 kg (174 lb 14.4 oz). Up with therapy Discharge to SNF when cleared medically.  DVT Prophylaxis - Lovenox, Foot Pumps and TED hose Weight-Bearing as tolerated to left leg  Reche Dixon, PA-C Orthopaedic Surgery 04/18/2016, 6:17 AM

## 2016-04-18 NOTE — Progress Notes (Addendum)
Physical Therapy Treatment Patient Details Name: Sandra Kaufman MRN: [id number] DOB: 12/09/1932 Today's Date: 04/18/2016    History of Present Illness 82 y/o female here after a fall at home with L hip fracture requiring a hemiarthroplasty    PT Comments    Pt found in bed with nasal cannula in hair; O2 saturation on room air 87%. O2 replaced and rechecked in 5 minutes with levels improved to 97%. Pt confused to time, place, situation and demonstrates difficulty following all instructions for mobility. It is noted that pt has spilled breakfast all over herself and bed, saturation gown and bed clothes. Nursing assistant contacted. Pt subsequently transferred up in bed and bed to chair with personal hygiene and change of gown. Pt requires Max of 1 to 2 for all mobility. Pt comfortable up in chair. Continue PT to progress strength, endurance and balance to progress all functional mobility.  Follow Up Recommendations  SNF     Equipment Recommendations       Recommendations for Other Services       Precautions / Restrictions Precautions Precautions: Fall;Posterior Hip Restrictions Weight Bearing Restrictions: Yes LLE Weight Bearing: Weight bearing as tolerated    Mobility  Bed Mobility Overal bed mobility: Needs Assistance Bed Mobility: Supine to Sit     Supine to sit: Max assist     General bed mobility comments: Requires max A for LEs and trunk as well as scoot to edge of bed  Transfers Overall transfer level: Needs assistance Equipment used: Rolling walker (2 wheeled) Transfers: Sit to/from [facility] Sit to Stand: Mod assist;+2 physical assistance Stand pivot transfers: Max assist;+2 physical assistance       General transfer comment: Unable to follow instructions for hand placement, sequence for stand. once initiated pt assist minimally and has ability to stand static for personal hygiene with mod A x 1  Ambulation/Gait             General  Gait Details: Unable    Stairs            Wheelchair Mobility    Modified Rankin (Stroke Patients Only)       Balance Overall balance assessment: Needs assistance Sitting-balance support: Feet supported;Bilateral upper extremity supported Sitting balance-Leahy Scale: Poor Sitting balance - Comments: Leans left; min A to remain upright Postural control: Left lateral lean Standing balance support: Bilateral upper extremity supported Standing balance-Leahy Scale: Poor Standing balance comment: Mod A with rw; Max A x 2 for stand pivot                    Cognition Arousal/Alertness: Awake/alert Behavior During Therapy: Anxious Overall Cognitive Status: Difficult to assess       Memory: Decreased recall of precautions              Exercises      General Comments        Pertinent Vitals/Pain Pain Assessment:  ("it hurts"; unable to quantify)    Home Living                      Prior Function            PT Goals (current goals can now be found in the care plan section) Progress towards PT goals: Progressing toward goals (slowly)    Frequency  BID    PT Plan Current plan remains appropriate    Co-evaluation  End of Session Equipment Utilized During Treatment: Gait belt Activity Tolerance: Patient limited by pain;Other (comment) (limited cognitively) Patient left: in chair;with call bell/phone within reach;with bed alarm set;with family/visitor present     Time: [id number] PT Time Calculation (min) (ACUTE ONLY): 17 min  Charges:  $Therapeutic Activity: 8-22 mins                    G Codes:      Charlaine Dalton, PTA 04/18/2016, 11:18 AM

## 2016-04-18 NOTE — Progress Notes (Addendum)
[place] at [place] NAME: Sandra Kaufman    MR#:  [id number]  DATE OF BIRTH:  10-13-1932  SUBJECTIVE:  CHIEF COMPLAINT:   Chief Complaint  Patient presents with  . Fall   -Patient admitted with fall and had left hip fracture and had surgery. Postop day 2 today - Dysphagia yesterday- improving, barium swallow with presbyesophagus and large hiatal hernia - needs SNF at discharge  REVIEW OF SYSTEMS:  Review of Systems  Unable to perform ROS: dementia    DRUG ALLERGIES:   Allergies  Allergen Reactions  . Tramadol Nausea And Vomiting    VITALS:  Blood pressure 156/92, pulse 98, temperature 99.6 F (37.6 C), temperature source Oral, resp. rate 18, height 5\' 5"  (1.651 m), weight 79.334 kg (174 lb 14.4 oz), SpO2 97 %.  PHYSICAL EXAMINATION:  Physical Exam  GENERAL:  82 y.o.-year-old elderly patient lying in the bed with no acute distress.  EYES: Pupils equal, round, reactive to light and accommodation. Bruising noted around both eyes from the fall, worse around the right eye. No scleral icterus. Extraocular muscles intact.  HEENT: Head atraumatic, normocephalic. Oropharynx and nasopharynx clear.  NECK:  Supple, no jugular venous distention. No thyroid enlargement, no tenderness.  LUNGS: Normal breath sounds bilaterally, no wheezing, rales,rhonchi or crepitation. No use of accessory muscles of respiration. Decreased bibasilar breath sounds. CARDIOVASCULAR: S1, S2 normal. No  rubs, or gallops. 3/6 systolic murmur present. ABDOMEN: Soft, nontender, nondistended. Bowel sounds present. No organomegaly or mass.  EXTREMITIES: No pedal edema, cyanosis, or clubbing. 1+ edema at the hips NEUROLOGIC: following commands, Cranial nerves seem intact.  Sensation intact. Gait not checked.  PSYCHIATRIC: The patient is alert and oriented to self.  SKIN: No obvious rash, lesion, or ulcer.    LABORATORY PANEL:   CBC  Recent Labs Lab  04/17/16 0449 04/18/16 0353  WBC 8.7  --   HGB 11.1* 10.9*  HCT 32.5*  --   PLT 177  --    ------------------------------------------------------------------------------------------------------------------  Chemistries   Recent Labs Lab 04/17/16 0449  NA 138  K 3.6  CL 107  CO2 25  GLUCOSE 186*  BUN 10  CREATININE 0.70  CALCIUM 7.9*   ------------------------------------------------------------------------------------------------------------------  Cardiac Enzymes No results for input(s): TROPONINI in the last 168 hours. ------------------------------------------------------------------------------------------------------------------  RADIOLOGY:  Dg Esophagus  04/17/2016  CLINICAL DATA:  Dysphagia, history of esophageal stricture and possible dilation in the past. EXAM: ESOPHOGRAM/BARIUM SWALLOW TECHNIQUE: Single contrast examination was performed using thin barium and water. FLUOROSCOPY TIME:  Fluoroscopy Time:  1 minutes, 42 seconds Number of Acquired Images:  10+ 2 video sequences. COMPARISON:  Abdominal and pelvic CT scan dated April 14, 2016 FINDINGS: The patient ingests barium and water without difficulty in the semi upright position. There is no laryngeal penetration of the barium. There are changes of presbyesophagus with a near corkscrew appearance with numerous tertiary contractions. There is a large hiatal hernia -partially intra thoracic stomach. This fills with the barium and intermittently empties through a fairly narrow channel into the subdiaphragmatic portion of the stomach. There is subtle twisting of the gastric mucosal folds as they pass through the diaphragmatic hiatus and mild volvulization cannot be excluded. IMPRESSION: 1. Presbyesophagus without evidence of significant stricturing. 2. Large hiatal hernia with fairly small passage through the esophageal hiatus into the subdiaphragmatic portion of the stomach. This allowed passage of the liquid barium but solid  foods would be problematic. Possible intermittent mild volvulization of the  hiatal hernia. Direct visualization would be useful if the patient can tolerate the procedure. Electronically Signed   By: David  Martinique M.D.   On: 04/17/2016 15:26   Dg Hip Port Unilat With Pelvis 1v Left  04/16/2016  CLINICAL DATA:  Status post hemiarthroplasty EXAM: DG HIP (WITH OR WITHOUT PELVIS) 1V PORT LEFT COMPARISON:  Yesterday FINDINGS: Left hip bipolar hemiarthroplasty without periprosthetic fracture or subluxation. Intact pelvic ring.  Sacral stimulator on the left. IMPRESSION: No adverse finding after left hip hemiarthroplasty. Electronically Signed   By: Monte Fantasia M.D.   On: 04/16/2016 18:02    EKG:   Orders placed or performed during the hospital encounter of 04/15/16  . EKG 12-Lead  . EKG 12-Lead    ASSESSMENT AND PLAN:   27 old female with past medical history significant for dementia, hypertension, hypothyroidism was brought in secondary to a mechanical fall and noted to have a left femoral fracture.  #1 displaced left femoral neck fracture-appreciate orthopedic consult. Postoperative day 2 today -Slight drop in hemoglobin, expected postop anemia. No indication for transfusion. -Continue pain management. -Physical therapy and DVT prophylaxis. -Discharged to SNF in the next 1-2 days  #2 dysphagia-coughing during eating yesterday. Seen by speech pathologist. Had barium swallow due to concern for esophageal stricture in the past. 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 -GI consult for the same. Improved swallowing today by speech therapy. Will be started on a diet. -Aspiration precautions.  #3 UTI-on Rocephin-treat for a total of 5 days. No cultures were sent.  #4 GERD-continue Protonix  #5 hypertension-on metoprolol added this morning as blood pressure is elevated  #6 DVT  prophylaxis-continue Lovenox for 2 weeks after surgery   All the records are reviewed and case discussed with Care Management/Social Workerr. Management plans discussed with the patient, family and they are in agreement.  CODE STATUS: DNR  TOTAL TIME TAKING CARE OF THIS PATIENT: 37 minutes.   POSSIBLE D/C IN 1-2 DAYS, DEPENDING ON CLINICAL CONDITION.   Gladstone Lighter M.D on 04/18/2016 at 11:45 AM  Between 7am to 6pm - Pager - [phone]  After 6pm go to [email or web address] - password [email or web address] [facility]  Office  [phone]  CC: Primary care physician; Einar Pheasant, MD

## 2016-04-19 LAB — GLUCOSE, CAPILLARY
Glucose-Capillary: 157 mg/dL — ABNORMAL HIGH (ref 65–99)
Glucose-Capillary: 167 mg/dL — ABNORMAL HIGH (ref 65–99)
Glucose-Capillary: 120 mg/dL — ABNORMAL HIGH (ref 65–99)
Glucose-Capillary: 204 mg/dL — ABNORMAL HIGH (ref 65–99)

## 2016-04-19 NOTE — Progress Notes (Signed)
Physical Therapy Treatment Patient Details Name: Sandra Kaufman MRN: [id number] DOB: June 22, 1932 Today's Date: 04/19/2016    History of Present Illness 82 y/o female here after a fall at home with L hip fracture requiring a hemiarthroplasty    PT Comments    Pt continues to be pleasantly confused but is able to participate more consistently with exercises.  She remains a heavy assist with both mobility and transfers and is essentially unable to do any walking needing total assist to go from bed to recliner despite much cuing,assist and extra time trying to do some weightshifting, etc in standing.    Follow Up Recommendations  SNF     Equipment Recommendations       Recommendations for Other Services       Precautions / Restrictions Precautions Precautions: Fall;Posterior Hip Restrictions Weight Bearing Restrictions: Yes LLE Weight Bearing: Weight bearing as tolerated    Mobility  Bed Mobility Overal bed mobility: Needs Assistance Bed Mobility: Supine to Sit     Supine to sit: Max assist     General bed mobility comments: Pt intially shows some effort but states "That's all I can do" when she is unable to lift her trunk even minimally off the bed  Transfers Overall transfer level: Needs assistance Equipment used: Rolling walker (2 wheeled) Transfers: Sit to/from Stand Sit to Stand: Mod assist;Max assist         General transfer comment: Pt again shows some effort but is very limited and unable to get to standing w/o signficant assist  Ambulation/Gait Ambulation/Gait assistance: Total assist Ambulation Distance (Feet): 2 Feet Assistive device: Rolling walker (2 wheeled)       General Gait Details: Pt again very limited with abiltiy to do much weight shifting or any real stepping in standing.  Heavy assist to keep her upright and direct assist needed to move either foot even minimally.  Pt is unable and unsafe to really to any ambulation at this time.     Stairs            Wheelchair Mobility    Modified Rankin (Stroke Patients Only)       Balance                                    Cognition Arousal/Alertness:  (pleasantly confused) Behavior During Therapy: Anxious Overall Cognitive Status: Difficult to assess                      Exercises Total Joint Exercises Ankle Circles/Pumps: 10 reps;AROM Quad Sets: Strengthening;10 reps Gluteal Sets: Strengthening;10 reps Short Arc Quad: AROM;10 reps Heel Slides: AAROM;20 reps Hip ABduction/ADduction: 15 reps;AAROM;AROM    General Comments        Pertinent Vitals/Pain Pain Assessment:  (states minimal pain at rest, 10/10 with mobiltiy)    Home Living                      Prior Function            PT Goals (current goals can now be found in the care plan section) Progress towards PT goals:  (very slow progress)    Frequency  BID    PT Plan Current plan remains appropriate    Co-evaluation             End of Session Equipment Utilized During Treatment: Gait belt Activity Tolerance: Patient limited  by pain (mental status and weakness limitations) Patient left: with chair alarm set;with call bell/phone within reach     Time: 0902-0927 PT Time Calculation (min) (ACUTE ONLY): 25 min  Charges:  $Therapeutic Exercise: 8-22 mins $Therapeutic Activity: 8-22 mins                    G Codes:     Wayne Both, PT, DPT [phone]  Kreg Shropshire 04/19/2016, 10:40 AM

## 2016-04-19 NOTE — Clinical Social Work Note (Signed)
Pt's discharge was held today, per RN. CSW will follow up tomorrow (Monday, 04/20/2016) regarding discharge. CSW spoke with pt's son, who is aware of potential discharge tomorrow. CSW updated facility. CSW will continue to follow.   Darden Dates, MSW, LCSW  Clinical Social Worker  [phone]

## 2016-04-19 NOTE — Plan of Care (Signed)
Problem: Nutrition: Goal: Ability to attain and maintain optimal nutritional status will improve Patient ate all of her food for dinner and have drunken 3 cups of grape juice

## 2016-04-19 NOTE — Progress Notes (Signed)
[place] at [place] NAME: Sandra Kaufman    MR#:  [id number]  DATE OF BIRTH:  [birth year]  SUBJECTIVE:  CHIEF COMPLAINT:   Chief Complaint  Patient presents with  . Fall   -Patient admitted with fall and had left hip fracture and had surgery. Postop day 3 today - No further dysphagia, occasional pocketing food in mouth. - Confusion much clearer today   REVIEW OF SYSTEMS:  Review of Systems  Constitutional: Negative for fever and chills.  HENT: Positive for hearing loss. Negative for ear discharge, ear pain and nosebleeds.   Eyes: Negative for blurred vision and double vision.  Respiratory: Negative for cough, shortness of breath and wheezing.   Cardiovascular: Negative for chest pain and palpitations.  Gastrointestinal: Negative for nausea, vomiting, abdominal pain, diarrhea and constipation.  Genitourinary: Negative for dysuria.  Musculoskeletal: Positive for myalgias and joint pain.       Left hip pain  Neurological: Positive for weakness. Negative for dizziness, sensory change, speech change, focal weakness, seizures and headaches.  Psychiatric/Behavioral: Positive for memory loss. Negative for depression.    DRUG ALLERGIES:   Allergies  Allergen Reactions  . Tramadol Nausea And Vomiting    VITALS:  Blood pressure 143/61, pulse 82, temperature 98.5 F (36.9 C), temperature source Oral, resp. rate 20, height 5\' 5"  (1.651 m), weight 79.334 kg (174 lb 14.4 oz), SpO2 93 %.  PHYSICAL EXAMINATION:  Physical Exam  GENERAL:  82 y.o.-year-old elderly patient lying in the bed with no acute distress.  EYES: Pupils equal, round, reactive to light and accommodation. Bruising noted around both eyes from the fall. No scleral icterus. Extraocular muscles intact.  HEENT: Head atraumatic, normocephalic. Oropharynx and nasopharynx clear.  NECK:  Supple, no jugular venous distention. No thyroid enlargement, no tenderness.  LUNGS:  Normal breath sounds bilaterally, no wheezing, rales,rhonchi or crepitation. No use of accessory muscles of respiration. Decreased bibasilar breath sounds. CARDIOVASCULAR: S1, S2 normal. No  rubs, or gallops. 3/6 systolic murmur present. ABDOMEN: Soft, nontender, nondistended. Bowel sounds present. No organomegaly or mass.  EXTREMITIES: No pedal edema, cyanosis, or clubbing. 1+ edema at the hips NEUROLOGIC: following commands, Cranial nerves seem intact.  Sensation intact. Gait not checked.  PSYCHIATRIC: The patient is alert and oriented x 1-2.  SKIN: No obvious rash, lesion, or ulcer.    LABORATORY PANEL:   CBC  Recent Labs Lab 04/17/16 0449 04/18/16 0353  WBC 8.7  --   HGB 11.1* 10.9*  HCT 32.5*  --   PLT 177  --    ------------------------------------------------------------------------------------------------------------------  Chemistries   Recent Labs Lab 04/17/16 0449  NA 138  K 3.6  CL 107  CO2 25  GLUCOSE 186*  BUN 10  CREATININE 0.70  CALCIUM 7.9*   ------------------------------------------------------------------------------------------------------------------  Cardiac Enzymes No results for input(s): TROPONINI in the last 168 hours. ------------------------------------------------------------------------------------------------------------------  RADIOLOGY:  Dg Esophagus  04/17/2016  CLINICAL DATA:  Dysphagia, history of esophageal stricture and possible dilation in the past. EXAM: ESOPHOGRAM/BARIUM SWALLOW TECHNIQUE: Single contrast examination was performed using thin barium and water. FLUOROSCOPY TIME:  Fluoroscopy Time:  1 minutes, 42 seconds Number of Acquired Images:  10+ 2 video sequences. COMPARISON:  Abdominal and pelvic CT scan dated April 14, 2016 FINDINGS: The patient ingests barium and water without difficulty in the semi upright position. There is no laryngeal penetration of the barium. There are changes of presbyesophagus with a near  corkscrew appearance with numerous tertiary contractions. There is  a large hiatal hernia -partially intra thoracic stomach. This fills with the barium and intermittently empties through a fairly narrow channel into the subdiaphragmatic portion of the stomach. There is subtle twisting of the gastric mucosal folds as they pass through the diaphragmatic hiatus and mild volvulization cannot be excluded. IMPRESSION: 1. Presbyesophagus without evidence of significant stricturing. 2. Large hiatal hernia with fairly small passage through the esophageal hiatus into the subdiaphragmatic portion of the stomach. This allowed passage of the liquid barium but solid foods would be problematic. Possible intermittent mild volvulization of the hiatal hernia. Direct visualization would be useful if the patient can tolerate the procedure. Electronically Signed   By: David  Martinique M.D.   On: 04/17/2016 15:26    EKG:   Orders placed or performed during the hospital encounter of 04/15/16  . EKG 12-Lead  . EKG 12-Lead    ASSESSMENT AND PLAN:   70 old female with past medical history significant for dementia, hypertension, hypothyroidism was brought in secondary to a mechanical fall and noted to have a left femoral fracture.  #1 Displaced left femoral neck fracture-appreciate orthopedic consult. Postoperative day 2 today -Slight drop in hemoglobin, expected postop anemia. No indication for transfusion. -Continue pain management. -Physical therapy and DVT prophylaxis. -Discharged to SNF tomorrow if swallowing is stable.  #2 dysphagia- Had barium swallow due to concern for esophageal stricture in the past. 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 Could be stricture - Appreciate GI consult- EGD either as inpatient vs outpatient- if able to eat now- continue and EGD as outpatient once recovered from Hip surgery -tolerating  dysphagia diet well -Aspiration precautions.  #3 UTI-on Rocephin-treat for a total of 5 days. No cultures were sent.  #4 GERD-continue Protonix  #5 hypertension-on metoprolol added as blood pressure is elevated  #6 DVT prophylaxis-continue Lovenox for 2 weeks after surgery  #7  Delirium- post op delirium, improving today Cont to monitor CT head on adm- chronic microvascular changes and old infarct No acute changes.  Possible discharge tor Rehab tomorrow   All the records are reviewed and case discussed with Care Management/Social Workerr. Management plans discussed with the patient, family and they are in agreement.  CODE STATUS: DNR  TOTAL TIME TAKING CARE OF THIS PATIENT: 37 minutes.   POSSIBLE D/C IN 1-2 DAYS, DEPENDING ON CLINICAL CONDITION.   Gladstone Lighter M.D on 04/19/2016 at 10:27 AM  Between 7am to 6pm - Pager - [phone]  After 6pm go to [email or web address] - password [email or web address] [facility]  Office  [phone]  CC: Primary care physician; Einar Pheasant, MD

## 2016-04-19 NOTE — Progress Notes (Signed)
  Subjective: 3 Days Post-Op Procedure(s) (LRB): ARTHROPLASTY BIPOLAR HIP (HEMIARTHROPLASTY) (Left) Patient reports pain as mild.   Patient seen in rounds with Dr. Roland Rack. Patient is well, and has had no acute complaints or problems. The patient is improving on her confusion. Plan is to go Skilled nursing facility after hospital stay. Negative for chest pain and shortness of breath Fever: no Gastrointestinal: Negative for nausea although had mild vomiting earlier  Objective: Vital signs in last 24 hours: Temp:  [98.6 F (37 C)-99.6 F (37.6 C)] 98.9 F (37.2 C) (04/23 0454) Pulse Rate:  [84-105] 84 (04/23 0454) Resp:  [18-20] 18 (04/22 2006) BP: (129-156)/(68-92) 154/80 mmHg (04/23 0454) SpO2:  [87 %-99 %] 94 % (04/23 0454)  Intake/Output from previous day:  Intake/Output Summary (Last 24 hours) at 04/19/16 0657 Last data filed at 04/19/16 0600  Gross per 24 hour  Intake    360 ml  Output      0 ml  Net    360 ml    Intake/Output this shift: Total I/O In: 220 [P.O.:220] Out: -   Labs:  Recent Labs  04/17/16 0449 04/18/16 0353  HGB 11.1* 10.9*    Recent Labs  04/17/16 0449  WBC 8.7  RBC 3.76*  HCT 32.5*  PLT 177    Recent Labs  04/17/16 0449  NA 138  K 3.6  CL 107  CO2 25  BUN 10  CREATININE 0.70  GLUCOSE 186*  CALCIUM 7.9*   No results for input(s): LABPT, INR in the last 72 hours.   EXAM General - Patient is alert and oriented. She has very minimal confusion now. Extremity - Sensation intact distally Dorsiflexion/Plantar flexion intact. The compartments are soft with no signs of cellulitis. Dressing/Incision - clean, dry, blood tinged and scant drainage Motor Function - intact, moving foot and toes well on exam. The patient ambulated 3 feet with physical therapy.  Past Medical History  Diagnosis Date  . Hypertension   . Hypercholesterolemia   . Hypothyroidism   . COPD (chronic obstructive pulmonary disease) ([place])   . GERD  (gastroesophageal reflux disease)     with esophageal stricture requiring dilatation x 2 (12/96 and 10/99)  . Arthritis   . Glaucoma   . Peripheral neuropathy ([place])   . History of chicken pox   . Diverticulosis   . Migraines   . Colon polyps   . Urinary incontinence   . Cancer ([place])     skin    Assessment/Plan: 3 Days Post-Op Procedure(s) (LRB): ARTHROPLASTY BIPOLAR HIP (HEMIARTHROPLASTY) (Left) Principal Problem:   Fracture of femoral neck, left (HCC) Active Problems:   Hypothyroidism   Hypercholesterolemia   Diabetes mellitus (HCC)   Hypertension   GERD (gastroesophageal reflux disease)   Chronic obstructive pulmonary disease (HCC)   Fracture, femur, neck, left, closed, initial encounter  Estimated body mass index is 29.1 kg/(m^2) as calculated from the following:   Height as of this encounter: 5\' 5"  (1.651 m).   Weight as of this encounter: 79.334 kg (174 lb 14.4 oz). Up with therapy Discharge to SNF when cleared medically.  DVT Prophylaxis - Lovenox, Foot Pumps and TED hose Weight-Bearing as tolerated to left leg  Reche Dixon, PA-C Orthopaedic Surgery 04/19/2016, 6:57 AM

## 2016-04-20 ENCOUNTER — Encounter: Payer: Self-pay | Admitting: Surgery

## 2016-04-20 ENCOUNTER — Inpatient Hospital Stay: Payer: Medicare Other

## 2016-04-20 LAB — URINALYSIS COMPLETE WITH MICROSCOPIC (ARMC ONLY)
Bacteria, UA: NONE SEEN
Bilirubin Urine: NEGATIVE
Glucose, UA: NEGATIVE mg/dL
Hgb urine dipstick: NEGATIVE
Ketones, ur: NEGATIVE mg/dL
Leukocytes, UA: NEGATIVE
Nitrite: NEGATIVE
Protein, ur: NEGATIVE mg/dL
Specific Gravity, Urine: 1.011 (ref 1.005–1.030)
pH: 8 (ref 5.0–8.0)

## 2016-04-20 LAB — CBC
HCT: 28.1 % — ABNORMAL LOW (ref 35.0–47.0)
Hemoglobin: 9.7 g/dL — ABNORMAL LOW (ref 12.0–16.0)
MCH: 29.5 pg (ref 26.0–34.0)
MCHC: 34.6 g/dL (ref 32.0–36.0)
MCV: 85.2 fL (ref 80.0–100.0)
Platelets: 225 10*3/uL (ref 150–440)
RBC: 3.3 MIL/uL — ABNORMAL LOW (ref 3.80–5.20)
RDW: 14.1 % (ref 11.5–14.5)
WBC: 8.7 10*3/uL (ref 3.6–11.0)

## 2016-04-20 LAB — BASIC METABOLIC PANEL
Anion gap: 9 (ref 5–15)
BUN: 16 mg/dL (ref 6–20)
CO2: 24 mmol/L (ref 22–32)
Calcium: 8.1 mg/dL — ABNORMAL LOW (ref 8.9–10.3)
Chloride: 104 mmol/L (ref 101–111)
Creatinine, Ser: 0.55 mg/dL (ref 0.44–1.00)
GFR calc Af Amer: >60 mL/min (ref >60)
GFR calc non Af Amer: >60 mL/min (ref >60)
Glucose, Bld: 178 mg/dL — ABNORMAL HIGH (ref 65–99)
Potassium: 3.5 mmol/L (ref 3.5–5.1)
Sodium: 137 mmol/L (ref 135–145)

## 2016-04-20 LAB — GLUCOSE, CAPILLARY
Glucose-Capillary: 157 mg/dL — ABNORMAL HIGH (ref 65–99)
Glucose-Capillary: 145 mg/dL — ABNORMAL HIGH (ref 65–99)
Glucose-Capillary: 116 mg/dL — ABNORMAL HIGH (ref 65–99)
Glucose-Capillary: 173 mg/dL — ABNORMAL HIGH (ref 65–99)

## 2016-04-20 LAB — SURGICAL PATHOLOGY

## 2016-04-20 MED ORDER — ENSURE ENLIVE PO LIQD
237.0000 mL | Freq: Two times a day (BID) | ORAL | Status: DC
  Administered 2016-04-21 – 2016-04-23 (×5): 237 mL via ORAL

## 2016-04-20 MED ORDER — HALOPERIDOL LACTATE 5 MG/ML IJ SOLN
1.0000 mg | INTRAMUSCULAR | Status: DC | PRN
  Administered 2016-04-21: 1 mg via INTRAVENOUS
  Filled 2016-04-20: qty 1

## 2016-04-20 MED ORDER — RISPERIDONE 0.5 MG PO TABS
0.5000 mg | ORAL_TABLET | Freq: Two times a day (BID) | ORAL | Status: DC
  Administered 2016-04-20 – 2016-04-22 (×5): 0.5 mg via ORAL
  Filled 2016-04-20 (×5): qty 1

## 2016-04-20 NOTE — Progress Notes (Signed)
Per MD patient has post op delirium and is not medically stable for D/C today. Plan is for patient to D/C to [facility] once stable. [name] admissions coordinator at [facility] is aware of above. CSW contacted patient's son [name] and made him aware of above. Per [name] he already spoke to MD and is aware of no D/C today. CSW will continue to follow and assist as needed.   Blima Rich, LCSW [phone]

## 2016-04-20 NOTE — Consult Note (Signed)
Patient with L hip fracture with surgery who has esophageal dysphagia and corkscrew esophagus.  She says she is swallowing ok on current diet, has been evaluated by dietician.  Abdomen is non tender, no HSM, no masses, bowel sounds present.  No plans at this time for EGD since she has a left hip fracture and a patient must usually lay on left side. Continue current diet.

## 2016-04-20 NOTE — Care Management Important Message (Signed)
Important Message  Patient Details  Name: Sandra Kaufman MRN: [id number] Date of Birth: 12-26-1932   Medicare Important Message Given:  Yes    Juliann Pulse A Lakesha Levinson 04/20/2016, 10:06 AM

## 2016-04-20 NOTE — Progress Notes (Signed)
Physical Therapy Treatment Patient Details Name: Sandra Kaufman MRN: [id number] DOB: February 01, 1932 Today's Date: 04/20/2016    History of Present Illness 82 y/o female here after a fall at home with L hip fracture requiring a hemiarthroplasty    PT Comments    Pt is very lethargic/somnolent during therapy session. She is able to follow approximately 10-20% of simple commands however is unable to stay awake long enough to participate with exercises. She requires maxA+1 for bed mobility and maxA+2 for sit to stand transfers. Unable to ambulate at this time or complete pre-gait activities at EOB. Pt performs LAQ in sitting but will not follow commands or stay awake for additional exercises. Shades drawn and lights on to try to help patient re-orient to days/nights. Pt will benefit from skilled PT services to address deficits in strength, balance, and mobility in order to return to full function at home.   Follow Up Recommendations  SNF     Equipment Recommendations  Other (comment) (TBD)    Recommendations for Other Services       Precautions / Restrictions Precautions Precautions: Fall;Posterior Hip Precaution Booklet Issued: Yes (comment) Restrictions Weight Bearing Restrictions: Yes LLE Weight Bearing: Weight bearing as tolerated    Mobility  Bed Mobility Overal bed mobility: Needs Assistance Bed Mobility: Supine to Sit     Supine to sit: Max assist     General bed mobility comments: Pt not following commands adequately to assist much with bed mobility. Rolled pt onto L side and then assisted with sidelying to sitting while respecting L posterior hip precautions  Transfers Overall transfer level: Needs assistance Equipment used: Rolling walker (2 wheeled) Transfers: Sit to/from Stand Sit to Stand: Max assist;+2 physical assistance Stand pivot transfers: +2 physical assistance;Total assist       General transfer comment: Pt shows minimal effort due to confusion and L  hip pain with transfer. Requires heavy verbal and tactile cues for transfer. Hand over hand assist. Once standing pt requires ModA+2 to remain in standing. Attempted standing marches but pt has difficulty following commands or accepting weight onto LLE.  Ambulation/Gait             General Gait Details: Pt unable to ambulate at this time. Performed dependent +2 assist from bed to recliner   Stairs            Wheelchair Mobility    Modified Rankin (Stroke Patients Only)       Balance Overall balance assessment: Needs assistance Sitting-balance support: Feet supported Sitting balance-Leahy Scale: Fair Sitting balance - Comments: Initially pt falling to the left but eventually able to maintain static sitting balance   Standing balance support: Bilateral upper extremity supported Standing balance-Leahy Scale: Poor                      Cognition Arousal/Alertness: Lethargic Behavior During Therapy: Anxious Overall Cognitive Status: Impaired/Different from baseline Area of Impairment: Orientation Orientation Level: Disoriented to;Place;Time;Situation   Memory: Decreased recall of precautions;Decreased short-term memory (Able to provide name of president)              Exercises Total Joint Exercises Long Arc Quad: Strengthening;Both;10 reps;Seated    General Comments        Pertinent Vitals/Pain Pain Assessment: Faces Faces Pain Scale: Hurts whole lot Pain Location: L hip, unable to rate pain on NPRS Pain Intervention(s): Monitored during session;Premedicated before session    Home Living  Prior Function            PT Goals (current goals can now be found in the care plan section) Acute Rehab PT Goals PT Goal Formulation: Patient unable to participate in goal setting Time For Goal Achievement: 05/01/16 Potential to Achieve Goals: Fair Progress towards PT goals: Not progressing toward goals - comment (Pt is very  lethargic and confused)    Frequency  BID    PT Plan Current plan remains appropriate    Co-evaluation             End of Session Equipment Utilized During Treatment: Gait belt Activity Tolerance: Patient limited by pain;Patient limited by lethargy;Patient limited by fatigue;Other (comment) (Mental status) Patient left: with chair alarm set;with call bell/phone within reach;in chair;with family/visitor present;with SCD's reapplied     Time: [id number] PT Time Calculation (min) (ACUTE ONLY): 16 min  Charges:  $Therapeutic Activity: 8-22 mins                    G Codes:      Lyndel Safe Huprich PT, DPT   Huprich,Jason 04/20/2016, 10:20 AM

## 2016-04-20 NOTE — Progress Notes (Signed)
Patient less lethargic asking appropriate questions. States she remembered she feel and broke her hip.States she was cold and requested a blanket. All around much improved cognitively.

## 2016-04-20 NOTE — Progress Notes (Signed)
Orders from Dr Florence Canner to I&O patient for UA. Pt up in chair at the time. Cath patient at or around 1330 removed 300 from bladder sample sent to lab.

## 2016-04-20 NOTE — Progress Notes (Signed)
Initial Nutrition Assessment      INTERVENTION:  Monitor intake and cater to pt preferences Recommend Ensure Enlive po BID, each supplement provides 350 kcal and 20 grams of protein    NUTRITION DIAGNOSIS:   Inadequate oral intake related to acute illness, lethargy/confusion as evidenced by meal completion < 25%.    GOAL:   Patient will meet greater than or equal to 90% of their needs    MONITOR:   PO intake, Supplement acceptance  REASON FOR ASSESSMENT:   Consult Poor PO  ASSESSMENT:   82 y/o female admitted following fall. Pt s/p left hip fracture and repair.   Past Medical History  Diagnosis Date  . Hypertension   . Hypercholesterolemia   . Hypothyroidism   . COPD (chronic obstructive pulmonary disease) ([place])   . GERD (gastroesophageal reflux disease)     with esophageal stricture requiring dilatation x 2 (12/96 and 10/99)  . Arthritis   . Glaucoma   . Peripheral neuropathy ([place])   . History of chicken pox   . Diverticulosis   . Migraines   . Colon polyps   . Urinary incontinence   . Cancer ([place])     skin   Medications reviewed Labs reviewed FSBS 145, 157, serum glucose 178  Pt unable to tell this writer intake prior to admission. Spoke with RN, Lambert Keto and ate magic cup and yogurt today, overall poor po intake. RN reports starting new medication today secondary to delirium.   Diet Order:  DIET - DYS 1 Room service appropriate?: Yes; Fluid consistency:: Thin  Skin:  Reviewed, no issues  Last BM:  4/24  Height:   Ht Readings from Last 1 Encounters:  04/16/16 5\' 5"  (1.651 m)    Weight: unsure regarding wt loss  Wt Readings from Last 1 Encounters:  04/16/16 174 lb 14.4 oz (79.334 kg)    Ideal Body Weight:     BMI:  Body mass index is 29.1 kg/(m^2).  Estimated Nutritional Needs:   Kcal:  [id number] kcals/d.   Protein:  57-88 g/d  Fluid:  1.7-1.9 L/d  EDUCATION NEEDS:   No education needs identified at this time  Kenzley Ke B. Zenia Resides,  [place], [place] (pager) Weekend/On-Call pager [phone])

## 2016-04-20 NOTE — Progress Notes (Signed)
  Subjective: 4 Days Post-Op Procedure(s) (LRB): ARTHROPLASTY BIPOLAR HIP (HEMIARTHROPLASTY) (Left) Patient reports pain as mild.   Patient seen in rounds with Dr. Roland Rack. Patient is well, and has had no acute complaints or problems. The patient is mildly confused.  Confused in the PM with little rest. Plan is to go Skilled nursing facility after hospital stay.  Probable today. Negative for chest pain and shortness of breath Fever: no Gastrointestinal: Negative for nausea and vomiting  Objective: Vital signs in last 24 hours: Temp:  [98.1 F (36.7 C)-98.5 F (36.9 C)] 98.1 F (36.7 C) (04/24 0355) Pulse Rate:  [76-92] 88 (04/24 0355) Resp:  [18-20] 20 (04/24 0355) BP: (115-146)/(61-68) 146/68 mmHg (04/24 0355) SpO2:  [91 %-99 %] 91 % (04/24 0355)  Intake/Output from previous day:  Intake/Output Summary (Last 24 hours) at 04/20/16 0612 Last data filed at 04/19/16 1756  Gross per 24 hour  Intake    836 ml  Output      0 ml  Net    836 ml    Intake/Output this shift:    Labs:  Recent Labs  04/18/16 0353 04/20/16 0402  HGB 10.9* 9.7*    Recent Labs  04/20/16 0402  WBC 8.7  RBC 3.30*  HCT 28.1*  PLT 225    Recent Labs  04/20/16 0402  NA 137  K 3.5  CL 104  CO2 24  BUN 16  CREATININE 0.55  GLUCOSE 178*  CALCIUM 8.1*   No results for input(s): LABPT, INR in the last 72 hours.   EXAM General - Patient is Alert and Confused Extremity - Sensation intact distally Dorsiflexion/Plantar flexion intact. The compartments are soft with no signs of cellulitis. Dressing/Incision - clean, dry, blood tinged and scant drainage Motor Function - intact, moving foot and toes well on exam. The patient ambulated 3 feet with physical therapy.  Past Medical History  Diagnosis Date  . Hypertension   . Hypercholesterolemia   . Hypothyroidism   . COPD (chronic obstructive pulmonary disease) ([place])   . GERD (gastroesophageal reflux disease)     with esophageal stricture  requiring dilatation x 2 (12/96 and 10/99)  . Arthritis   . Glaucoma   . Peripheral neuropathy ([place])   . History of chicken pox   . Diverticulosis   . Migraines   . Colon polyps   . Urinary incontinence   . Cancer ([place])     skin    Assessment/Plan: 4 Days Post-Op Procedure(s) (LRB): ARTHROPLASTY BIPOLAR HIP (HEMIARTHROPLASTY) (Left) Principal Problem:   Fracture of femoral neck, left ([facility]) Active Problems:   Hypothyroidism   Hypercholesterolemia   Diabetes mellitus ([facility])   Hypertension   GERD (gastroesophageal reflux disease)   Chronic obstructive pulmonary disease ([facility])   Fracture, femur, neck, left, closed, initial encounter  Estimated body mass index is 29.1 kg/(m^2) as calculated from the following:   Height as of this encounter: 5\' 5"  (1.651 m).   Weight as of this encounter: 79.334 kg (174 lb 14.4 oz). Up with therapy Discharge to SNF when cleared medically.  DVT Prophylaxis - Lovenox, Foot Pumps and TED hose Weight-Bearing as tolerated to left leg  Reche Dixon, PA-C Orthopaedic Surgery 04/20/2016, 6:12 AM

## 2016-04-20 NOTE — Progress Notes (Signed)
Physical Therapy Treatment Patient Details Name: Sandra Kaufman MRN: [id number] DOB: 1932/07/29 Today's Date: 04/20/2016    History of Present Illness 82 y/o female here after a fall at home with L hip fracture requiring a hemiarthroplasty    PT Comments    Pt is very lethargic and confused throughout session. She requires heavy verbal/tactile cues as well as hand over hand assist to complete exercises. Pt had just returned to bed upon arrival and per RN was a total assist x 2 for chair to bed transfer. Pt requires heavy input to stay awake during session and participate fully. Further transfers and bed mobility deferred at this time due to lethargy/cognition. Pt will benefit from skilled PT services to address deficits in strength, balance, and mobility in order to return to full function at home.    Follow Up Recommendations  SNF     Equipment Recommendations  Other (comment) (TBD)    Recommendations for Other Services       Precautions / Restrictions Precautions Precautions: Fall;Posterior Hip Precaution Booklet Issued: Yes (comment) Restrictions Weight Bearing Restrictions: Yes LLE Weight Bearing: Weight bearing as tolerated    Mobility  Bed Mobility Overal bed mobility: Needs Assistance Bed Mobility: Supine to Sit     Supine to sit: Max assist     General bed mobility comments: Pt not following commands adequately to assist much with bed mobility. Rolled pt onto L side and then assisted with sidelying to sitting while respecting L posterior hip precautions  Transfers Overall transfer level: Needs assistance Equipment used: Rolling walker (2 wheeled) Transfers: Sit to/from Stand Sit to Stand: Max assist;+2 physical assistance Stand pivot transfers: +2 physical assistance;Total assist       General transfer comment: Pt shows minimal effort due to confusion and L hip pain with transfer. Requires heavy verbal and tactile cues for transfer. Hand over hand assist.  Once standing pt requires ModA+2 to remain in standing. Attempted standing marches but pt has difficulty following commands or accepting weight onto LLE.  Ambulation/Gait             General Gait Details: Pt unable to ambulate at this time. Performed dependent +2 assist from bed to recliner   Stairs            Wheelchair Mobility    Modified Rankin (Stroke Patients Only)       Balance Overall balance assessment: Needs assistance Sitting-balance support: Feet supported Sitting balance-Leahy Scale: Fair Sitting balance - Comments: Initially pt falling to the left but eventually able to maintain static sitting balance   Standing balance support: Bilateral upper extremity supported Standing balance-Leahy Scale: Poor                      Cognition Arousal/Alertness: Lethargic Behavior During Therapy: Anxious Overall Cognitive Status: Impaired/Different from baseline Area of Impairment: Orientation Orientation Level: Disoriented to;Place;Time;Situation   Memory: Decreased recall of precautions;Decreased short-term memory         General Comments: Pt is lethargic and sleep throughout session. Has difficulty keeping eyes open    Exercises Total Joint Exercises Ankle Circles/Pumps: 10 reps;Strengthening;Both;Supine Quad Sets: Strengthening;10 reps;Both;Supine Gluteal Sets: Strengthening;10 reps;Both;Supine Towel Squeeze: Strengthening;Both;10 reps;Supine Short Arc Quad: 10 reps;Strengthening;Left;Supine Heel Slides: Strengthening;Left;10 reps;Supine Hip ABduction/ADduction: Strengthening;Left;10 reps;Supine Straight Leg Raises: Strengthening;Left;10 reps;Supine Long Arc Quad: Strengthening;Both;10 reps;Seated    General Comments        Pertinent Vitals/Pain Pain Assessment: Faces Faces Pain Scale: Hurts a little bit Pain Location: L  hip, pt also reports facial pain Pain Intervention(s): Monitored during session    Home Living                       Prior Function            PT Goals (current goals can now be found in the care plan section) Acute Rehab PT Goals PT Goal Formulation: Patient unable to participate in goal setting Time For Goal Achievement: 05/01/16 Potential to Achieve Goals: Fair Progress towards PT goals: Progressing toward goals    Frequency  BID    PT Plan Current plan remains appropriate    Co-evaluation             End of Session Equipment Utilized During Treatment: Gait belt Activity Tolerance: Patient limited by pain;Patient limited by lethargy;Patient limited by fatigue;Other (comment) (Mental status) Patient left: with call bell/phone within reach;with SCD's reapplied;in bed;with bed alarm set     Time: [id number] PT Time Calculation (min) (ACUTE ONLY): 9 min  Charges:  $Therapeutic Exercise: 8-22 mins $Therapeutic Activity: 8-22 mins                    G Codes:      Lyndel Safe Audre Cenci PT, DPT   Taytem Ghattas 04/20/2016, 1:56 PM

## 2016-04-20 NOTE — Progress Notes (Signed)
Speech Language Pathology Treatment: Dysphagia  Patient Details Name: Sandra Kaufman MRN: [id number] DOB: 1932-03-17 Today's Date: 04/20/2016 Time: [id number] SLP Time Calculation (min) (ACUTE ONLY): 45 min  Assessment / Plan / Recommendation Clinical Impression  Pt appeared more drowsy initially and severely confused throughout session. She required max. Verbal/tactile/visual cues as well as hand over hand assist to participate taking po trials. Pt exhibited moderate+ oral phase deficits c/b oral holding of bolus trials w/ slower A-P transfer and clearing overall. Pt was given time b/t trials and even tactile cues of spoon to lips to encourage opening mouth and swallowing. Trials of puree food and liquids were alternated as strategies. Pt was easily distracted and often looked to her Right(door in that direction). Distractions were minimized (closed door, turned off tv). No overt s/s of aspiration were noted w/ the trials given, however, pt's significant decline in Cognitive status puts her at higher risk for aspiration at this time. Unsure if pt's presentation is related to her Cognitive decline - exacerbation of Dementia.  Recommend continue w/ pureed diet consistency (d/t GI Esophageal deficits) w/ thin liquids; strict Reflux and aspiration precautions; Meds in Puree - Crushed; feeding assistance w/ checking for oral clearing. Pt would benefit from Dietician consult for Drink supplement for support as per results of CXR. ST will f/u w/ toleration of diet consistency as indicated; education.    HPI HPI: Pt is a 82 y.o. female with multiple medical problems including Dementia, Esophageal Strictures and Dilitations x3 per Son, Acid Reflux per Son, hiatal hernia per chart CXR note, COPD, hypertension, hypothyroidism, GERD, and hypercholesterolemia and other medical issues who lives at home BUT w/ 24/7 Caregivers per Son's report. She uses a walker for ambulation. Apparently, she tripped and fell while  trying to put her slippers on yesterday evening, landing on her left hip. She was brought to the emergency room where x-rays demonstrated a displaced left femoral neck fracture. Subsequent, she was admitted to the hospitalist service for medical stabilization in preparation for definitive management of her injury. The patient denies any associated injuries resulting from the fall last evening. However, she did fall 2 days earlier and struck the right side of her face, resulting in ecchymosis around the right eye and forehead region. Patient was with her Caretaker when she was walking outside(pt stated she was "leaving and walking up the street to my other house" when she fell in the street); it was reported that she tripped. Pt stated she struck her head on the concrete. She was evaluated then at [facility] emergency room then discharged after a thorough workup. The patient denies any lightheadedness, dizziness, shortness of breath, or other symptoms that may have precipitated the fall last evening. On a GI note, pt has a h/o Esophageal dysmotility and Esophageal strictures w/ dilitations - Son reported GI did not want to "do another dilitation if he didn't have to" d/t the multiple dilitations done and her age. Son reported pt tends to eat foods (Breads, Meats) she shouldn't w/ the degree of Esphageal dysmotility issues. Pt is on a PPI. Son reported pt has 24/7 Caregivers at home caring for her. On 04/17/16, pt had a GI Barium study performed showing Large hiatal hernia with fairly small passage through the esophageal hiatus into the subdiaphragmatic portion of the stomach. This allowed passage of the liquid barium but solid foods would be problematic.       SLP Plan  Continue with current plan of care  Recommendations  Diet recommendations: Dysphagia 1 (puree);Thin liquid Liquids provided via: Cup;No straw Medication Administration: Crushed with puree Supervision: Full  supervision/cueing for compensatory strategies;Trained caregiver to feed patient Compensations: Minimize environmental distractions;Slow rate;Small sips/bites;Follow solids with liquid;Lingual sweep for clearance of pocketing (check for oral clearing) Postural Changes and/or Swallow Maneuvers: Seated upright 90 degrees;Upright 30-60 min after meal             General recommendations:  (Dietician consult) Oral Care Recommendations: Oral care BID;Staff/trained caregiver to provide oral care Follow up Recommendations: Skilled Nursing facility Plan: Continue with current plan of care     [place], [place], CCC-SLP  Simisola Sandles 04/20/2016, 2:37 PM

## 2016-04-20 NOTE — Progress Notes (Signed)
[facility] [place] [place] NAME: Sandra Kaufman    MR#:  [id number]  DATE OF BIRTH:  [birth year]  SUBJECTIVE:  CHIEF COMPLAINT:   Chief Complaint  Patient presents with  . Fall   -Patient admitted with fall and had left hip fracture and had surgery. Postop day 4 today - Very delirious this morning. Unable to comprehend well. No focal weakness - Not oriented at all.   REVIEW OF SYSTEMS:  Review of Systems  Unable to perform ROS: mental status change    DRUG ALLERGIES:   Allergies  Allergen Reactions  . Tramadol Nausea And Vomiting    VITALS:  Blood pressure 141/72, pulse 81, temperature 99.1 F (37.3 C), temperature source Oral, resp. rate 18, height 5\' 5"  (1.651 m), weight 79.334 kg (174 lb 14.4 oz), SpO2 91 %.  PHYSICAL EXAMINATION:  Physical Exam  GENERAL:  82 y.o.-year-old elderly patient lying in the bed with no acute distress.  EYES: Pupils equal, round, reactive to light and accommodation. Bruising noted around both eyes from the fall. No scleral icterus. Extraocular muscles intact.  HEENT: Head atraumatic, normocephalic. Oropharynx and nasopharynx clear.  NECK:  Supple, no jugular venous distention. No thyroid enlargement, no tenderness.  LUNGS: Normal breath sounds bilaterally, no wheezing, rales,rhonchi or crepitation. No use of accessory muscles of respiration. Decreased bibasilar breath sounds. CARDIOVASCULAR: S1, S2 normal. No  rubs, or gallops. 3/6 systolic murmur present. ABDOMEN: Soft, nontender, nondistended. Bowel sounds present. No organomegaly or mass.  EXTREMITIES: No pedal edema, cyanosis, or clubbing. 1+ edema at the hips NEUROLOGIC: confused but following commands on repeated asking, Cranial nerves seem intact.  Sensation intact. Gait not checked.  PSYCHIATRIC: The patient is alert  But not oriented SKIN: No obvious rash, lesion, or ulcer.    LABORATORY PANEL:   CBC  Recent Labs Lab 04/20/16 0402   WBC 8.7  HGB 9.7*  HCT 28.1*  PLT 225   ------------------------------------------------------------------------------------------------------------------  Chemistries   Recent Labs Lab 04/20/16 0402  NA 137  K 3.5  CL 104  CO2 24  GLUCOSE 178*  BUN 16  CREATININE 0.55  CALCIUM 8.1*   ------------------------------------------------------------------------------------------------------------------  Cardiac Enzymes No results for input(s): TROPONINI in the last 168 hours. ------------------------------------------------------------------------------------------------------------------  RADIOLOGY:  No results found.  EKG:   Orders placed or performed during the hospital encounter of 04/15/16  . EKG 12-Lead  . EKG 12-Lead    ASSESSMENT AND PLAN:   35 old female with past medical history significant for dementia, hypertension, hypothyroidism was brought in secondary to a mechanical fall and noted to have a left femoral fracture.  #1 Displaced left femoral neck fracture-appreciate orthopedic consult. Postoperative day 4 today -Slight drop in hemoglobin, expected postop anemia. No indication for transfusion. -Continue pain management. -Physical therapy and DVT prophylaxis. - post op delirium noted- discharge to rehab once confusion clears - labs normal, no infection noted.  #2 dysphagia- Had barium swallow due to concern for esophageal stricture in the past. 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 Could be stricture - Appreciate GI consult- EGD likely as outpatient-as able to eat soft diet with meds crushed for now -tolerating dysphagia diet well -Aspiration precautions.  #3 UTI-on Rocephin-treat for a total of 5 days. Today is day 5. No cultures were sent. Repeat UA today  #4 GERD-continue Protonix  #5 hypertension-on metoprolol added as blood pressure is  elevated  #  6 DVT prophylaxis-continue Lovenox for 2 weeks after surgery  #7  Delirium- post op delirium, worse today- add risperidone bid low dose and monitor - get her hearing aids, glasses- out of bed to chair during the morning, keep blinds open Cont to monitor CT head on adm- chronic microvascular changes and old infarct  Possible discharge delirium clears   All the records are reviewed and case discussed with Care Management/Social Workerr. Management plans discussed with the patient, family and they are in agreement.  CODE STATUS: DNR  TOTAL TIME TAKING CARE OF THIS PATIENT: 37 minutes.   POSSIBLE D/C IN 1-2 DAYS, DEPENDING ON CLINICAL CONDITION.   Gladstone Lighter M.D on 04/20/2016 at 9:19 AM  Between 7am to 6pm - Pager - [phone]  After 6pm go to [email or web address] - password [email or web address] [facility]  Office  [phone]  CC: Primary care physician; Einar Pheasant, MD

## 2016-04-21 LAB — GLUCOSE, CAPILLARY
Glucose-Capillary: 160 mg/dL — ABNORMAL HIGH (ref 65–99)
Glucose-Capillary: 229 mg/dL — ABNORMAL HIGH (ref 65–99)
Glucose-Capillary: 143 mg/dL — ABNORMAL HIGH (ref 65–99)
Glucose-Capillary: 151 mg/dL — ABNORMAL HIGH (ref 65–99)

## 2016-04-21 LAB — CBC
HCT: 30.3 % — ABNORMAL LOW (ref 35.0–47.0)
Hemoglobin: 10.4 g/dL — ABNORMAL LOW (ref 12.0–16.0)
MCH: 29.4 pg (ref 26.0–34.0)
MCHC: 34.3 g/dL (ref 32.0–36.0)
MCV: 85.9 fL (ref 80.0–100.0)
Platelets: 273 10*3/uL (ref 150–440)
RBC: 3.53 MIL/uL — ABNORMAL LOW (ref 3.80–5.20)
RDW: 14.4 % (ref 11.5–14.5)
WBC: 8.1 10*3/uL (ref 3.6–11.0)

## 2016-04-21 LAB — BASIC METABOLIC PANEL
Anion gap: 8 (ref 5–15)
BUN: 16 mg/dL (ref 6–20)
CO2: 25 mmol/L (ref 22–32)
Calcium: 8.4 mg/dL — ABNORMAL LOW (ref 8.9–10.3)
Chloride: 103 mmol/L (ref 101–111)
Creatinine, Ser: 0.61 mg/dL (ref 0.44–1.00)
GFR calc Af Amer: >60 mL/min (ref >60)
GFR calc non Af Amer: >60 mL/min (ref >60)
Glucose, Bld: 211 mg/dL — ABNORMAL HIGH (ref 65–99)
Potassium: 3.4 mmol/L — ABNORMAL LOW (ref 3.5–5.1)
Sodium: 136 mmol/L (ref 135–145)

## 2016-04-21 MED ORDER — HALOPERIDOL LACTATE 5 MG/ML IJ SOLN
1.0000 mg | Freq: Once | INTRAMUSCULAR | Status: AC
  Administered 2016-04-21: 1 mg via INTRAVENOUS
  Filled 2016-04-21: qty 1

## 2016-04-21 MED ORDER — HALOPERIDOL LACTATE 5 MG/ML IJ SOLN
2.0000 mg | INTRAMUSCULAR | Status: DC | PRN
  Administered 2016-04-22: 2 mg via INTRAVENOUS
  Filled 2016-04-21: qty 1

## 2016-04-21 NOTE — Progress Notes (Signed)
Physical Therapy Treatment Patient Details Name: Sandra Kaufman MRN: [id number] DOB: Aug 03, 1932 Today's Date: 04/21/2016    History of Present Illness 82 y/o female here after a fall at home with L hip fracture requiring a hemiarthroplasty    PT Comments    Pt appearing more fatigued this afternoon and also appeared with some increased confusion compared to morning PT session (nursing notified).  Pt able to take a few steps to go from chair to bed but had increased difficulty responding to cues to shift weight forward d/t significant posterior lean.  Will attempt to progress ambulation next PT session as able/appropriate.   Follow Up Recommendations  SNF     Equipment Recommendations   (TBD)    Recommendations for Other Services       Precautions / Restrictions Precautions Precautions: Fall;Posterior Hip Restrictions Weight Bearing Restrictions: Yes LLE Weight Bearing: Weight bearing as tolerated    Mobility  Bed Mobility Overal bed mobility: Needs Assistance Bed Mobility: Sit to Supine       Sit to supine: Max assist   General bed mobility comments: assist for trunk and B LE's and to maintain posterior L hip precautions; vc's for technique required  Transfers Overall transfer level: Needs assistance Equipment used: Rolling walker (2 wheeled) Transfers: Sit to/from Stand Sit to Stand: Mod assist;+2 physical assistance         General transfer comment: pt with significant posterior lean upon standing but able to weight shift forward with moderate vc's; pt required vc's for hand and feet placement  Ambulation/Gait Ambulation/Gait assistance: Mod assist;+2 physical assistance Ambulation Distance (Feet): 3 Feet Assistive device: Rolling walker (2 wheeled)   Gait velocity: decreased   General Gait Details: decreased stance time L LE; vc's required for increasing UE support through walker to off-weight L LE and also for forward weight shift d/t posterior lean;  limited distance d/t fatigue   Stairs            Wheelchair Mobility    Modified Rankin (Stroke Patients Only)       Balance Overall balance assessment: Needs assistance Sitting-balance support: Bilateral upper extremity supported;Feet supported Sitting balance-Leahy Scale: Fair     Standing balance support: Bilateral upper extremity supported (on RW) Standing balance-Leahy Scale: Poor Standing balance comment: posterior lean with standing and taking steps towards bed                    Cognition Arousal/Alertness: Awake/alert Behavior During Therapy:  (Appeared confused compared to earlier PT session today) Overall Cognitive Status: No family/caregiver present to determine baseline cognitive functioning Area of Impairment: Orientation (Oriented to person and place; not to situation or time)     Memory: Decreased recall of precautions;Decreased short-term memory              Exercises Total Joint Exercises Ankle Circles/Pumps: AROM;Both;10 reps;Supine;Strengthening Quad Sets: AROM;Both;10 reps;Supine;Strengthening Short Arc Quad: AROM;Both;10 reps;Supine;Strengthening Heel Slides: AAROM;Both;10 reps;Supine;Strengthening Hip ABduction/ADduction: AAROM;Both;10 reps;Supine;Strengthening    General Comments  Pt agreeable to PT session.      Pertinent Vitals/Pain Pain Assessment: 0-10 Pain Score: 6  Pain Location: L hip with mobility Pain Descriptors / Indicators: Aching;Sore;Tender Pain Intervention(s): Limited activity within patient's tolerance;Monitored during session;Repositioned    Home Living                      Prior Function            PT Goals (current goals can now be  found in the care plan section) Acute Rehab PT Goals PT Goal Formulation: With patient Time For Goal Achievement: 05/05/16 Potential to Achieve Goals: Good Progress towards PT goals: Progressing toward goals    Frequency  BID    PT Plan Current plan  remains appropriate    Co-evaluation             End of Session Equipment Utilized During Treatment: Gait belt Activity Tolerance: Patient limited by fatigue Patient left: in bed;with call bell/phone within reach;with bed alarm set;with SCD's reapplied (B heels elevated via pillows and pillows placed between pt's knees to maintain posterior hip precautions)     Time: 1351-1420 PT Time Calculation (min) (ACUTE ONLY): 29 min  Charges:  $Therapeutic Exercise: 8-22 mins $Therapeutic Activity: 8-22 mins                    G CodesLeitha Bleak 2016-05-11, 4:10 PM Leitha Bleak, [place]

## 2016-04-21 NOTE — Progress Notes (Signed)
Per MD patient is not stable for D/C today. Patient will likely D/C to [facility] tomorrow. [name] admissions coordinator at [facility] is aware of above. Clinical Social Worker (CSW) will continue to follow and assist as needed.   Blima Rich, LCSW [phone]

## 2016-04-21 NOTE — Progress Notes (Signed)
Physical Therapy Treatment Patient Details Name: Sandra Kaufman MRN: [id number] DOB: 1932/08/30 Today's Date: 04/21/2016    History of Present Illness 82 y/o female here after a fall at home with L hip fracture requiring a hemiarthroplasty    PT Comments    Pt demonstrates significant improvement in being able to follow commands today with ex's in bed and with functional mobility.  Pt was able to take a few steps bed to recliner with RW and 2 assist but limited d/t c/o L hip pain.  Pt also demonstrating posterior lean in sitting and standing but was able to correct with cueing from therapist.  Next session, will attempt to increase ambulation distance with RW per pt tolerance.   Follow Up Recommendations  SNF     Equipment Recommendations   (TBD)    Recommendations for Other Services       Precautions / Restrictions Precautions Precautions: Fall;Posterior Hip Restrictions Weight Bearing Restrictions: Yes LLE Weight Bearing: Weight bearing as tolerated    Mobility  Bed Mobility Overal bed mobility: Needs Assistance Bed Mobility: Supine to Sit     Supine to sit: Max assist;HOB elevated     General bed mobility comments: assist for trunk and B LE's and to maintain posterior L hip precautions; vc's for technique required  Transfers Overall transfer level: Needs assistance Equipment used: Rolling walker (2 wheeled) Transfers: Sit to/from Stand Sit to Stand: Mod assist;+2 physical assistance         General transfer comment: pt with significant posterior lean upon standing but able to weight shift forward with minimal vc's; pt required vc's for hand and feet placement  Ambulation/Gait Ambulation/Gait assistance: Mod assist;+2 physical assistance Ambulation Distance (Feet): 3 Feet Assistive device: Rolling walker (2 wheeled)   Gait velocity: decreased   General Gait Details: decreased stance time L LE; vc's required for increasing UE support through walker to  off-weight L LE and also for forward weight shift d/t posterior lean; limited distance d/t c/o L hip pain   Stairs            Wheelchair Mobility    Modified Rankin (Stroke Patients Only)       Balance Overall balance assessment: Needs assistance Sitting-balance support: Bilateral upper extremity supported;Feet supported Sitting balance-Leahy Scale: Poor Sitting balance - Comments: initial posterior lean requiring assist to maintain upright but with vc's for positioning pt able to sit on edge of bed with CGA Postural control: Posterior lean Standing balance support: Bilateral upper extremity supported (on RW) Standing balance-Leahy Scale: Poor Standing balance comment: initial significant posterior lean but with intermittent vc's pt able to to shift weight more forward                    Cognition Arousal/Alertness: Awake/alert Behavior During Therapy: WFL for tasks assessed/performed Overall Cognitive Status: No family/caregiver present to determine baseline cognitive functioning Area of Impairment: Orientation (Oriented to person and situation; not oriented to time or place)     Memory: Decreased recall of precautions;Decreased short-term memory              Exercises Total Joint Exercises Ankle Circles/Pumps: AROM;Both;10 reps;Supine;Strengthening Quad Sets: AROM;Both;10 reps;Supine;Strengthening Short Arc Quad: AROM;Both;10 reps;Supine;Strengthening Heel Slides: AAROM;Both;10 reps;Supine;Strengthening Hip ABduction/ADduction: AAROM;Both;10 reps;Supine;Strengthening    General Comments   Nursing cleared pt for participation in physical therapy.  Pt agreeable to PT session.      Pertinent Vitals/Pain Pain Assessment: Faces Faces Pain Scale: Hurts even more Pain Location: L hip  with mobility Pain Descriptors / Indicators: Aching;Sore;Tender Pain Intervention(s): Limited activity within patient's tolerance;Monitored during session;Repositioned   Vitals stable and WFL throughout treatment session.    Home Living                      Prior Function            PT Goals (current goals can now be found in the care plan section) Acute Rehab PT Goals PT Goal Formulation: With patient Time For Goal Achievement: 05/05/16 Potential to Achieve Goals: Good Progress towards PT goals: Progressing toward goals (to be able to walk again)    Frequency  BID    PT Plan Current plan remains appropriate    Co-evaluation             End of Session Equipment Utilized During Treatment: Gait belt Activity Tolerance: Patient limited by pain Patient left: in chair;with call bell/phone within reach;with chair alarm set;with SCD's reapplied (B heels elevated via pillows and pillows placed between pt's knees for posterior hip precautions)     Time: [id number] PT Time Calculation (min) (ACUTE ONLY): 24 min  Charges:  $Therapeutic Exercise: 8-22 mins $Therapeutic Activity: 8-22 mins                    G CodesLeitha Bleak 05/01/2016, 10:19 AM Leitha Bleak, [place]

## 2016-04-21 NOTE — Consult Note (Signed)
Patient seen while her son was present.  She became disoriented and demented while at home and thought she was somewhere else and tried to run out side and fell and broke her hip.  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  Abd exam shows no tenderness, no masses, no HSM.   She clearly needs psychiatry consult for her dementia which is not all post op related per her son.

## 2016-04-21 NOTE — Progress Notes (Signed)
  Subjective: 5 Days Post-Op Procedure(s) (LRB): ARTHROPLASTY BIPOLAR HIP (HEMIARTHROPLASTY) (Left) Patient reports pain as mild.   Patient seen in rounds with Dr. Roland Rack. Patient is well, and has had no acute complaints or problems. The patient is more confused.  The patient is unaware of her location. She is not complaining of a lot of pain. Plan is to go Skilled nursing facility after hospital stay.  Probable today. Negative for chest pain and shortness of breath Fever: no Gastrointestinal: Negative for nausea and vomiting  Objective: Vital signs in last 24 hours: Temp:  [97.4 F (36.3 C)-99.1 F (37.3 C)] 98.4 F (36.9 C) (04/25 0415) Pulse Rate:  [66-89] 76 (04/25 0415) Resp:  [16-28] 28 (04/25 0415) BP: (112-141)/(54-80) 128/80 mmHg (04/25 0415) SpO2:  [91 %-98 %] 98 % (04/25 0415)  Intake/Output from previous day:  Intake/Output Summary (Last 24 hours) at 04/21/16 0737 Last data filed at 04/20/16 1835  Gross per 24 hour  Intake      0 ml  Output    250 ml  Net   -250 ml    Intake/Output this shift:    Labs:  Recent Labs  04/20/16 0402  HGB 9.7*    Recent Labs  04/20/16 0402  WBC 8.7  RBC 3.30*  HCT 28.1*  PLT 225    Recent Labs  04/20/16 0402  NA 137  K 3.5  CL 104  CO2 24  BUN 16  CREATININE 0.55  GLUCOSE 178*  CALCIUM 8.1*   No results for input(s): LABPT, INR in the last 72 hours.   EXAM General - Patient is Alert and Confused Extremity - Sensation intact distally Dorsiflexion/Plantar flexion intact. The compartments are soft with no signs of cellulitis. Dressing/Incision - clean, dry, blood tinged and scant drainage Motor Function - intact, moving foot and toes well on exam. The patient ambulated 3 feet with physical therapy.  Past Medical History  Diagnosis Date  . Hypertension   . Hypercholesterolemia   . Hypothyroidism   . COPD (chronic obstructive pulmonary disease) ([place])   . GERD (gastroesophageal reflux disease)     with  esophageal stricture requiring dilatation x 2 (12/96 and 10/99)  . Arthritis   . Glaucoma   . Peripheral neuropathy ([place])   . History of chicken pox   . Diverticulosis   . Migraines   . Colon polyps   . Urinary incontinence   . Cancer ([place])     skin    Assessment/Plan: 5 Days Post-Op Procedure(s) (LRB): ARTHROPLASTY BIPOLAR HIP (HEMIARTHROPLASTY) (Left) Principal Problem:   Fracture of femoral neck, left (HCC) Active Problems:   Hypothyroidism   Hypercholesterolemia   Diabetes mellitus (HCC)   Hypertension   GERD (gastroesophageal reflux disease)   Chronic obstructive pulmonary disease (HCC)   Fracture, femur, neck, left, closed, initial encounter  Estimated body mass index is 29.1 kg/(m^2) as calculated from the following:   Height as of this encounter: 5\' 5"  (1.651 m).   Weight as of this encounter: 79.334 kg (174 lb 14.4 oz). Up with therapy Discharge to SNF when cleared medically.  DVT Prophylaxis - Lovenox, Foot Pumps and TED hose Weight-Bearing as tolerated to left leg  Reche Dixon, PA-C Orthopaedic Surgery 04/21/2016, 7:37 AM

## 2016-04-21 NOTE — Progress Notes (Signed)
[place] [facility] [place] NAME: Sandra Kaufman    MR#:  [id number]  DATE OF BIRTH:  March 15, 1932  SUBJECTIVE:  CHIEF COMPLAINT:   Chief Complaint  Patient presents with  . Fall   -Patient admitted with fall and had left hip fracture and had surgery. Postop day 5 today - Delirium improving, received haldol early this AM for confusion. More alert today   REVIEW OF SYSTEMS:  Review of Systems  Constitutional: Positive for malaise/fatigue. Negative for fever and chills.  HENT: Negative for ear discharge, ear pain and nosebleeds.   Eyes: Positive for blurred vision.  Respiratory: Negative for cough, shortness of breath and wheezing.   Cardiovascular: Positive for leg swelling. Negative for chest pain and palpitations.  Gastrointestinal: Negative for nausea, vomiting, abdominal pain, diarrhea and constipation.  Genitourinary: Negative for dysuria.  Musculoskeletal: Positive for joint pain.       Left hip pain  Neurological: Negative for dizziness, seizures and headaches.  Psychiatric/Behavioral: Positive for hallucinations. Negative for depression.    DRUG ALLERGIES:   Allergies  Allergen Reactions  . Tramadol Nausea And Vomiting    VITALS:  Blood pressure 157/81, pulse 79, temperature 98.5 F (36.9 C), temperature source Oral, resp. rate 20, height 5\' 5"  (1.651 m), weight 79.334 kg (174 lb 14.4 oz), SpO2 97 %.  PHYSICAL EXAMINATION:  Physical Exam  GENERAL:  82 y.o.-year-old elderly patient lying in the bed with no acute distress. More alert today. EYES: Pupils equal, round, reactive to light and accommodation. Bruising noted around both eyes from the fall clearing up. No scleral icterus. Extraocular muscles intact.  HEENT: Head atraumatic, normocephalic. Oropharynx and nasopharynx clear.  NECK:  Supple, no jugular venous distention. No thyroid enlargement, no tenderness.  LUNGS: Normal breath sounds bilaterally, no wheezing,  rales,rhonchi or crepitation. No use of accessory muscles of respiration. Decreased bibasilar breath sounds. CARDIOVASCULAR: S1, S2 normal. No  rubs, or gallops. 3/6 systolic murmur present. ABDOMEN: Soft, nontender, nondistended. Bowel sounds present. No organomegaly or mass.  EXTREMITIES: No pedal edema, cyanosis, or clubbing. 1+ edema at the hips NEUROLOGIC: Cranial nerves intact, following commands. Sensation intact. Gait not checked.  Occasional hallucinations PSYCHIATRIC: The patient is alert , oriented x 2 SKIN: No obvious rash, lesion, or ulcer.    LABORATORY PANEL:   CBC  Recent Labs Lab 04/20/16 0402  WBC 8.7  HGB 9.7*  HCT 28.1*  PLT 225   ------------------------------------------------------------------------------------------------------------------  Chemistries   Recent Labs Lab 04/20/16 0402  NA 137  K 3.5  CL 104  CO2 24  GLUCOSE 178*  BUN 16  CREATININE 0.55  CALCIUM 8.1*   ------------------------------------------------------------------------------------------------------------------  Cardiac Enzymes No results for input(s): TROPONINI in the last 168 hours. ------------------------------------------------------------------------------------------------------------------  RADIOLOGY:  Dg Chest Port 1 View  04/20/2016  CLINICAL DATA:  Hypoxia EXAM: PORTABLE CHEST 1 VIEW COMPARISON:  04/15/2016 FINDINGS: Lungs are under aerated. Low volumes. Upper normal heart size. Vascular congestion. No consolidation. Small hiatal hernia. No pneumothorax. IMPRESSION: Vascular congestion without consolidation or pulmonary edema. Electronically Signed   By: Marybelle Killings M.D.   On: 04/20/2016 11:12    EKG:   Orders placed or performed during the hospital encounter of 04/15/16  . EKG 12-Lead  . EKG 12-Lead    ASSESSMENT AND PLAN:   55 old female with past medical history significant for dementia, hypertension, hypothyroidism was brought in secondary to a  mechanical fall and noted to have a left femoral fracture.  #  1 Displaced left femoral neck fracture-appreciate orthopedic consult. Postoperative day 5 today -Slight drop in hemoglobin, expected postop anemia. No indication for transfusion. -Continue pain management. -Physical therapy and DVT prophylaxis. - post op delirium noted- discharge to rehab once confusion clears - labs normal, no infection noted.  #2 dysphagia- Had barium swallow due to concern for esophageal stricture in the past. 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 Could be stricture - Appreciate GI consult- EGD likely as outpatient-as able to eat soft diet with meds crushed for now -tolerating dysphagia diet well -Aspiration precautions.  #3 UTI- on rocephin- finished 5 days. Discontinue today Repeat UA normal  #4 GERD-continue Protonix  #5 hypertension-on metoprolol added as blood pressure is elevated  #6 DVT prophylaxis-continue Lovenox for 2 weeks after surgery  #7  Delirium- post op delirium, improving today- continue risperidone bid low dose and monitor - get her hearing aids, glasses- out of bed to chair during the morning, keep blinds open Cont to monitor CT head on adm- chronic microvascular changes and old infarct  Possible discharge delirium clears Likely tomorrow as improving now   All the records are reviewed and case discussed with Care Management/Social Workerr. Management plans discussed with the patient, family and they are in agreement.  CODE STATUS: DNR  TOTAL TIME TAKING CARE OF THIS PATIENT: 37 minutes.   POSSIBLE D/C IN 1-2 DAYS, DEPENDING ON CLINICAL CONDITION.   Gladstone Lighter M.D on 04/21/2016 at 11:31 AM  Between 7am to 6pm - Pager - [phone]  After 6pm go to [email or web address] - password [email or web address] [facility]  Office  [phone]  CC: Primary care  physician; Einar Pheasant, MD

## 2016-04-21 NOTE — Care Management (Signed)
Post op delirium. POD 5 today- SNF pending. Slow progress with PT. Received Haldol around 1AM today. Started on Respiradol.

## 2016-04-22 ENCOUNTER — Inpatient Hospital Stay: Payer: Medicare Other

## 2016-04-22 ENCOUNTER — Ambulatory Visit: Payer: Medicare Other | Admitting: Internal Medicine

## 2016-04-22 DIAGNOSIS — F0390 Unspecified dementia without behavioral disturbance: Secondary | ICD-10-CM

## 2016-04-22 DIAGNOSIS — F0391 Unspecified dementia with behavioral disturbance: Secondary | ICD-10-CM

## 2016-04-22 LAB — GLUCOSE, CAPILLARY
Glucose-Capillary: 143 mg/dL — ABNORMAL HIGH (ref 65–99)
Glucose-Capillary: 151 mg/dL — ABNORMAL HIGH (ref 65–99)
Glucose-Capillary: 146 mg/dL — ABNORMAL HIGH (ref 65–99)
Glucose-Capillary: 164 mg/dL — ABNORMAL HIGH (ref 65–99)

## 2016-04-22 LAB — MAGNESIUM: Magnesium: 1.6 mg/dL — ABNORMAL LOW (ref 1.7–2.4)

## 2016-04-22 MED ORDER — AMLODIPINE BESYLATE 5 MG PO TABS
5.0000 mg | ORAL_TABLET | Freq: Every day | ORAL | Status: DC
Start: 2016-04-22 — End: 2016-04-23
  Administered 2016-04-22 – 2016-04-23 (×2): 5 mg via ORAL
  Filled 2016-04-22 (×2): qty 1

## 2016-04-22 MED ORDER — POTASSIUM CHLORIDE CRYS ER 20 MEQ PO TBCR
40.0000 meq | EXTENDED_RELEASE_TABLET | Freq: Once | ORAL | Status: AC
  Administered 2016-04-22: 40 meq via ORAL
  Filled 2016-04-22: qty 2

## 2016-04-22 MED ORDER — HYDRALAZINE HCL 20 MG/ML IJ SOLN: 10.0000 mg | Freq: Four times a day (QID) | INTRAMUSCULAR | Status: DC | PRN

## 2016-04-22 MED ORDER — PANTOPRAZOLE SODIUM 40 MG PO PACK
40.0000 mg | PACK | Freq: Every day | ORAL | Status: DC
  Administered 2016-04-22 – 2016-04-23 (×2): 40 mg via ORAL
  Filled 2016-04-22 (×2): qty 20

## 2016-04-22 MED ORDER — METFORMIN HCL 500 MG PO TABS
500.0000 mg | ORAL_TABLET | Freq: Two times a day (BID) | ORAL | Status: DC
  Administered 2016-04-22 – 2016-04-23 (×2): 500 mg via ORAL
  Filled 2016-04-22 (×2): qty 1

## 2016-04-22 MED ORDER — RISPERIDONE 1 MG PO TBDP
1.0000 mg | ORAL_TABLET | Freq: Every day | ORAL | Status: DC
  Administered 2016-04-22: 1 mg via ORAL
  Filled 2016-04-22 (×2): qty 1

## 2016-04-22 MED ORDER — MAGNESIUM SULFATE 2 GM/50ML IV SOLN
2.0000 g | Freq: Once | INTRAVENOUS | Status: AC
  Administered 2016-04-22: 2 g via INTRAVENOUS
  Filled 2016-04-22: qty 50

## 2016-04-22 NOTE — Progress Notes (Signed)
PT Cancellation Note  Patient Details Name: Sandra Kaufman MRN: [id number] DOB: 1932-01-20   Cancelled Treatment:    Reason Eval/Treat Not Completed: Fatigue/lethargy limiting ability to participate.  Pt very lethargic and unable to participate in PT.  Will re-attempt PT treatment session later today as medically appropriate.   Raquel Sarna Vi Biddinger 04/22/2016, 10:48 AM Leitha Bleak, [place]

## 2016-04-22 NOTE — Care Management Important Message (Signed)
Important Message  Patient Details  Name: Sandra Kaufman MRN: [id number] Date of Birth: 1932/05/16   Medicare Important Message Given:  Yes    [name] 04/22/2016, 10:23 AM

## 2016-04-22 NOTE — Progress Notes (Signed)
Pt continues to be confused. Dr Weber Cooks consulted meds changed discharge most likely tomorrow.

## 2016-04-22 NOTE — Progress Notes (Signed)
[facility] NAME: Sandra Kaufman    MR#:  [id number]  DATE OF BIRTH:  16-Feb-1932  SUBJECTIVE:  CHIEF COMPLAINT:   Chief Complaint  Patient presents with  . Fall   -Patient admitted with fall and had left hip fracture and had surgery. Postop day 6 today -Delirium worse again- patient very sleepy- confusion worsening towards evening and worse at night- ? Sun downing - also had similar - not this worse confusion at home because of which the fall happened - repeat ct head today and psych consult   REVIEW OF SYSTEMS:  Review of Systems  Unable to perform ROS: dementia    DRUG ALLERGIES:   Allergies  Allergen Reactions  . Tramadol Nausea And Vomiting    VITALS:  Blood pressure 185/94, pulse 82, temperature 98.6 F (37 C), temperature source Oral, resp. rate 18, height 5\' 5"  (1.651 m), weight 79.334 kg (174 lb 14.4 oz), SpO2 100 %.  PHYSICAL EXAMINATION:  Physical Exam  GENERAL:  82 y.o.-year-old elderly patient lying in the bed with no acute distress. Mumbling, very confused EYES: Pupils equal, round, reactive to light and accommodation. No scleral icterus. Extraocular muscles intact.  HEENT: Head atraumatic, normocephalic. Oropharynx and nasopharynx clear.  NECK:  Supple, no jugular venous distention. No thyroid enlargement, no tenderness.  LUNGS: Normal breath sounds bilaterally, no wheezing, rales,rhonchi or crepitation. No use of accessory muscles of respiration. Decreased bibasilar breath sounds. CARDIOVASCULAR: S1, S2 normal. No  rubs, or gallops. 3/6 systolic murmur present. ABDOMEN: Soft, nontender, nondistended. Bowel sounds present. No organomegaly or mass.  EXTREMITIES: No pedal edema, cyanosis, or clubbing. 1+ edema at the hips NEUROLOGIC: mumbling, not opening eyes, incoherent conversation PSYCHIATRIC: The patient is alert but very confused today SKIN: No obvious rash, lesion, or ulcer.    LABORATORY  PANEL:   CBC  Recent Labs Lab 04/21/16 1220  WBC 8.1  HGB 10.4*  HCT 30.3*  PLT 273   ------------------------------------------------------------------------------------------------------------------  Chemistries   Recent Labs Lab 04/21/16 1220  NA 136  K 3.4*  CL 103  CO2 25  GLUCOSE 211*  BUN 16  CREATININE 0.61  CALCIUM 8.4*   ------------------------------------------------------------------------------------------------------------------  Cardiac Enzymes No results for input(s): TROPONINI in the last 168 hours. ------------------------------------------------------------------------------------------------------------------  RADIOLOGY:  Dg Chest Port 1 View  04/20/2016  CLINICAL DATA:  Hypoxia EXAM: PORTABLE CHEST 1 VIEW COMPARISON:  04/15/2016 FINDINGS: Lungs are under aerated. Low volumes. Upper normal heart size. Vascular congestion. No consolidation. Small hiatal hernia. No pneumothorax. IMPRESSION: Vascular congestion without consolidation or pulmonary edema. Electronically Signed   By: Marybelle Killings M.D.   On: 04/20/2016 11:12    EKG:   Orders placed or performed during the hospital encounter of 04/15/16  . EKG 12-Lead  . EKG 12-Lead    ASSESSMENT AND PLAN:   32 old female with past medical history significant for dementia, hypertension, hypothyroidism was brought in secondary to a mechanical fall and noted to have a left femoral fracture.  #1 Displaced left femoral neck fracture-appreciate orthopedic consult. Postoperative day 6 today -Slight drop in hemoglobin, expected postop anemia. No indication for transfusion. -Continue pain management. -Physical therapy and DVT prophylaxis. - post op delirium noted- discharge to rehab once confusion clears - labs normal, no infection noted.  #2 dysphagia- Had barium swallow due to concern for esophageal stricture in the past. -Barium swallow showing presbyesophagus without any significant stricture but has  large hiatal hernia with narrowing at  the end of esophagus. Could be stricture - Appreciate GI consult- EGD likely as outpatient-as able to eat soft diet with meds crushed for now -tolerating dysphagia diet well -Aspiration precautions.  #3 UTI- on rocephin- finished 5 days. Repeat UA normal  #4 GERD-continue Protonix  #5 hypertension-on metoprolol added as blood pressure is elevated Add norvasc iV hydralazine prn  #6 DVT prophylaxis-continue Lovenox for 2 weeks after surgery  #7  Acute Delirium- on top of dementia, likely worse dementia at baseline - no other causes found- no uti- cleared after ABx, cxr with no acute findings - started risperidone, still more confused in evenings, no sleep - didn't receive any narcotics, last haldol at 2:41Am this morning - WILL get PSYCH CONSULT TODAY - REPEAT CT HEAD TODAY CT head on adm- chronic microvascular changes and old infarct  Not stable for discharge today  All the records are reviewed and case discussed with Care Management/Social Workerr. Management plans discussed with the patient, family and they are in agreement.  CODE STATUS: DNR  TOTAL TIME TAKING CARE OF THIS PATIENT: 41 minutes.   POSSIBLE D/C IN 1-2 DAYS, DEPENDING ON CLINICAL CONDITION.   Gladstone Lighter M.D on 04/22/2016 at 7:58 AM  Between 7am to 6pm - Pager - [phone]  After 6pm go to [email or web address] - password [email or web address] [facility]  Office  [phone]  CC: Primary care physician; Einar Pheasant, MD

## 2016-04-22 NOTE — Progress Notes (Signed)
PT Cancellation Note  Patient Details Name: Sandra Kaufman MRN: [id number] DOB: 1932/08/27   Cancelled Treatment:    Reason Eval/Treat Not Completed: Patient at procedure or test/unavailable.  Staff present to take pt off floor for imaging (per notes pt has repeat head CT ordered).  Will hold PT at this time and re-attempt PT treatment session later today as medically appropriate.   Raquel Sarna Miakoda Mcmillion 04/22/2016, 8:57 AM Leitha Bleak, [place]

## 2016-04-22 NOTE — Progress Notes (Signed)
  Subjective: 6 Days Post-Op Procedure(s) (LRB): ARTHROPLASTY BIPOLAR HIP (HEMIARTHROPLASTY) (Left) Patient reports pain as mild.   Patient seen in rounds with Dr. Roland Rack. Patient is well, and has had no acute complaints or problems. The patient is still confused.  The patient is unaware of her location. She is not complaining of a lot of pain. Plan is to go Skilled nursing facility after hospital stay.  Probable today. Negative for chest pain and shortness of breath Fever: no Gastrointestinal: Negative for nausea and vomiting  Objective: Vital signs in last 24 hours: Temp:  [97.7 F (36.5 C)-98.5 F (36.9 C)] 98 F (36.7 C) (04/26 0342) Pulse Rate:  [70-81] 77 (04/26 0342) Resp:  [16-24] 24 (04/26 0342) BP: (112-160)/(65-91) 160/91 mmHg (04/26 0342) SpO2:  [97 %-100 %] 100 % (04/26 0342)  Intake/Output from previous day:  Intake/Output Summary (Last 24 hours) at 04/22/16 0712 Last data filed at 04/21/16 1900  Gross per 24 hour  Intake    240 ml  Output      0 ml  Net    240 ml    Intake/Output this shift:    Labs:  Recent Labs  04/20/16 0402 04/21/16 1220  HGB 9.7* 10.4*    Recent Labs  04/20/16 0402 04/21/16 1220  WBC 8.7 8.1  RBC 3.30* 3.53*  HCT 28.1* 30.3*  PLT 225 273    Recent Labs  04/20/16 0402 04/21/16 1220  NA 137 136  K 3.5 3.4*  CL 104 103  CO2 24 25  BUN 16 16  CREATININE 0.55 0.61  GLUCOSE 178* 211*  CALCIUM 8.1* 8.4*   No results for input(s): LABPT, INR in the last 72 hours.   EXAM General - Patient is Alert and Confused Extremity - Sensation intact distally Dorsiflexion/Plantar flexion intact. The compartments are soft with no signs of cellulitis. Dressing/Incision - clean, dry, blood tinged and scant drainage Motor Function - intact, moving foot and toes well on exam. The patient ambulated 3 feet with physical therapy.  Past Medical History  Diagnosis Date  . Hypertension   . Hypercholesterolemia   . Hypothyroidism   .  COPD (chronic obstructive pulmonary disease) ([place])   . GERD (gastroesophageal reflux disease)     with esophageal stricture requiring dilatation x 2 (12/96 and 10/99)  . Arthritis   . Glaucoma   . Peripheral neuropathy ([place])   . History of chicken pox   . Diverticulosis   . Migraines   . Colon polyps   . Urinary incontinence   . Cancer ([place])     skin    Assessment/Plan: 6 Days Post-Op Procedure(s) (LRB): ARTHROPLASTY BIPOLAR HIP (HEMIARTHROPLASTY) (Left) Principal Problem:   Fracture of femoral neck, left (HCC) Active Problems:   Hypothyroidism   Hypercholesterolemia   Diabetes mellitus (HCC)   Hypertension   GERD (gastroesophageal reflux disease)   Chronic obstructive pulmonary disease (HCC)   Fracture, femur, neck, left, closed, initial encounter  Estimated body mass index is 29.1 kg/(m^2) as calculated from the following:   Height as of this encounter: 5\' 5"  (1.651 m).   Weight as of this encounter: 79.334 kg (174 lb 14.4 oz). Up with therapy Discharge to SNF when cleared medically.  DVT Prophylaxis - Lovenox, Foot Pumps and TED hose Weight-Bearing as tolerated to left leg  Reche Dixon, PA-C Orthopaedic Surgery 04/22/2016, 7:12 AM

## 2016-04-22 NOTE — Progress Notes (Signed)
Per MD note this morning patient is not stable for D/C today. MD has ordered a repeat CT. Clinical Education officer, museum (CSW) notified [name] admissions Warehouse manager at [employer]. CSW will continue to follow and assist as needed.   Blima Rich, LCSW [phone]

## 2016-04-22 NOTE — Consult Note (Signed)
BP somewhat erratic, probably reflects her mental status.  Currently asleep and I did not awaken her.  Nurse discussed with me the patient ate 3/4 of her lunch.  Psychiatry has seen her and made a few medicine changes.  Since she is eating her pureed diet  I have no need to do anything else with her at this time.  I will sign off, reconsult if needed.

## 2016-04-22 NOTE — Progress Notes (Signed)
Physical Therapy Treatment Patient Details Name: Sandra Kaufman MRN: [id number] DOB: 1932/03/13 Today's Date: 04/22/2016    History of Present Illness 82 y/o female here after a fall at home with L hip fracture requiring a hemiarthroplasty    PT Comments    Pt initially following some commands and participating in PT session (and talking with therapist although very tangential and appearing confused) but after performing some ex's in bed, pt closed her eyes and appearing lethargic and not participating anymore (unable to get pt OOB d/t this).  Will continue to progress pt with functional mobility as able.   Follow Up Recommendations  SNF     Equipment Recommendations   (TBD)    Recommendations for Other Services       Precautions / Restrictions Precautions Precautions: Fall;Posterior Hip Restrictions Weight Bearing Restrictions: Yes LLE Weight Bearing: Weight bearing as tolerated    Mobility  Bed Mobility               General bed mobility comments: Pt not assissting with max vc's and pt declining to get OOB  Transfers                    Ambulation/Gait                 Stairs            Wheelchair Mobility    Modified Rankin (Stroke Patients Only)       Balance                                    Cognition Arousal/Alertness: Lethargic Behavior During Therapy:  (Confused; tangential) Overall Cognitive Status: No family/caregiver present to determine baseline cognitive functioning Area of Impairment: Orientation Orientation Level: Disoriented to;Place;Time;Situation   Memory: Decreased recall of precautions;Decreased short-term memory         General Comments: Pt initially alert but then lethargic and unable to continue participating    Exercises Total Joint Exercises Ankle Circles/Pumps: AROM;Both;10 reps;Supine;Strengthening Quad Sets: AROM;Both;10 reps;Supine;Strengthening Short Arc Quad: Both;10  reps;Supine;Strengthening;AAROM Heel Slides: AAROM;Both;10 reps;Supine;Strengthening Hip ABduction/ADduction: AAROM;Both;10 reps;Supine;Strengthening    General Comments   Nursing cleared pt for participation in physical therapy.  Pt agreeable to PT session.      Pertinent Vitals/Pain Pain Assessment: Faces Faces Pain Scale: Hurts little more Pain Location: L hip with mobility Pain Descriptors / Indicators: Grimacing Pain Intervention(s): Limited activity within patient's tolerance;Monitored during session;Repositioned    Home Living                      Prior Function            PT Goals (current goals can now be found in the care plan section) Acute Rehab PT Goals PT Goal Formulation: With patient Time For Goal Achievement: 05/05/16 Potential to Achieve Goals: Fair Progress towards PT goals: Progressing toward goals (with LE strengthening)    Frequency  BID    PT Plan Current plan remains appropriate    Co-evaluation             End of Session   Activity Tolerance: Patient limited by lethargy Patient left: in bed;with call bell/phone within reach;with bed alarm set;with SCD's reapplied (B pillows placed between pt's knees for posterior THP's and to elevate B heels off bed)     Time: 1416-1430 PT Time Calculation (min) (ACUTE ONLY): 14 min  Charges:  $Therapeutic Exercise: 8-22 mins                    G CodesLeitha Bleak May 03, 2016, 3:17 PM Leitha Bleak, [place]
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
## 2016-04-23 ENCOUNTER — Telehealth: Payer: Self-pay | Admitting: Internal Medicine

## 2016-04-23 DIAGNOSIS — K219 Gastro-esophageal reflux disease without esophagitis: Secondary | ICD-10-CM | POA: Diagnosis not present

## 2016-04-23 DIAGNOSIS — M6281 Muscle weakness (generalized): Secondary | ICD-10-CM | POA: Diagnosis not present

## 2016-04-23 DIAGNOSIS — H409 Unspecified glaucoma: Secondary | ICD-10-CM | POA: Diagnosis not present

## 2016-04-23 DIAGNOSIS — M84459A Pathological fracture, hip, unspecified, initial encounter for fracture: Secondary | ICD-10-CM | POA: Diagnosis not present

## 2016-04-23 DIAGNOSIS — R1311 Dysphagia, oral phase: Secondary | ICD-10-CM | POA: Diagnosis not present

## 2016-04-23 DIAGNOSIS — E1139 Type 2 diabetes mellitus with other diabetic ophthalmic complication: Secondary | ICD-10-CM | POA: Diagnosis not present

## 2016-04-23 DIAGNOSIS — F419 Anxiety disorder, unspecified: Secondary | ICD-10-CM | POA: Diagnosis not present

## 2016-04-23 DIAGNOSIS — F0390 Unspecified dementia without behavioral disturbance: Secondary | ICD-10-CM | POA: Diagnosis not present

## 2016-04-23 DIAGNOSIS — R410 Disorientation, unspecified: Secondary | ICD-10-CM | POA: Diagnosis not present

## 2016-04-23 DIAGNOSIS — J449 Chronic obstructive pulmonary disease, unspecified: Secondary | ICD-10-CM | POA: Diagnosis not present

## 2016-04-23 DIAGNOSIS — E639 Nutritional deficiency, unspecified: Secondary | ICD-10-CM | POA: Diagnosis not present

## 2016-04-23 DIAGNOSIS — Z7984 Long term (current) use of oral hypoglycemic drugs: Secondary | ICD-10-CM | POA: Diagnosis not present

## 2016-04-23 DIAGNOSIS — E039 Hypothyroidism, unspecified: Secondary | ICD-10-CM | POA: Diagnosis not present

## 2016-04-23 DIAGNOSIS — Z9181 History of falling: Secondary | ICD-10-CM | POA: Diagnosis not present

## 2016-04-23 DIAGNOSIS — E1140 Type 2 diabetes mellitus with diabetic neuropathy, unspecified: Secondary | ICD-10-CM | POA: Diagnosis not present

## 2016-04-23 DIAGNOSIS — R41841 Cognitive communication deficit: Secondary | ICD-10-CM | POA: Diagnosis not present

## 2016-04-23 DIAGNOSIS — F329 Major depressive disorder, single episode, unspecified: Secondary | ICD-10-CM | POA: Diagnosis not present

## 2016-04-23 DIAGNOSIS — R1310 Dysphagia, unspecified: Secondary | ICD-10-CM | POA: Diagnosis not present

## 2016-04-23 DIAGNOSIS — Z96642 Presence of left artificial hip joint: Secondary | ICD-10-CM | POA: Diagnosis not present

## 2016-04-23 DIAGNOSIS — F0391 Unspecified dementia with behavioral disturbance: Secondary | ICD-10-CM | POA: Diagnosis not present

## 2016-04-23 DIAGNOSIS — F0281 Dementia in other diseases classified elsewhere with behavioral disturbance: Secondary | ICD-10-CM | POA: Diagnosis not present

## 2016-04-23 DIAGNOSIS — M810 Age-related osteoporosis without current pathological fracture: Secondary | ICD-10-CM | POA: Diagnosis not present

## 2016-04-23 DIAGNOSIS — R262 Difficulty in walking, not elsewhere classified: Secondary | ICD-10-CM | POA: Diagnosis not present

## 2016-04-23 DIAGNOSIS — S72002D Fracture of unspecified part of neck of left femur, subsequent encounter for closed fracture with routine healing: Secondary | ICD-10-CM | POA: Diagnosis not present

## 2016-04-23 DIAGNOSIS — G4700 Insomnia, unspecified: Secondary | ICD-10-CM | POA: Diagnosis not present

## 2016-04-23 DIAGNOSIS — E7800 Pure hypercholesterolemia, unspecified: Secondary | ICD-10-CM | POA: Diagnosis not present

## 2016-04-23 DIAGNOSIS — S72002A Fracture of unspecified part of neck of left femur, initial encounter for closed fracture: Secondary | ICD-10-CM | POA: Diagnosis not present

## 2016-04-23 DIAGNOSIS — E119 Type 2 diabetes mellitus without complications: Secondary | ICD-10-CM | POA: Diagnosis not present

## 2016-04-23 DIAGNOSIS — G9340 Encephalopathy, unspecified: Secondary | ICD-10-CM | POA: Diagnosis not present

## 2016-04-23 DIAGNOSIS — I10 Essential (primary) hypertension: Secondary | ICD-10-CM | POA: Diagnosis not present

## 2016-04-23 LAB — BASIC METABOLIC PANEL
Anion gap: 7 (ref 5–15)
BUN: 18 mg/dL (ref 6–20)
CO2: 25 mmol/L (ref 22–32)
Calcium: 8.1 mg/dL — ABNORMAL LOW (ref 8.9–10.3)
Chloride: 107 mmol/L (ref 101–111)
Creatinine, Ser: 0.74 mg/dL (ref 0.44–1.00)
GFR calc Af Amer: >60 mL/min (ref >60)
GFR calc non Af Amer: >60 mL/min (ref >60)
Glucose, Bld: 141 mg/dL — ABNORMAL HIGH (ref 65–99)
Potassium: 3.6 mmol/L (ref 3.5–5.1)
Sodium: 139 mmol/L (ref 135–145)

## 2016-04-23 LAB — MAGNESIUM: Magnesium: 2 mg/dL (ref 1.7–2.4)

## 2016-04-23 LAB — CBC
HCT: 29.9 % — ABNORMAL LOW (ref 35.0–47.0)
Hemoglobin: 10.1 g/dL — ABNORMAL LOW (ref 12.0–16.0)
MCH: 28.8 pg (ref 26.0–34.0)
MCHC: 33.7 g/dL (ref 32.0–36.0)
MCV: 85.5 fL (ref 80.0–100.0)
Platelets: 309 10*3/uL (ref 150–440)
RBC: 3.49 MIL/uL — ABNORMAL LOW (ref 3.80–5.20)
RDW: 14.4 % (ref 11.5–14.5)
WBC: 8 10*3/uL (ref 3.6–11.0)

## 2016-04-23 LAB — GLUCOSE, CAPILLARY
Glucose-Capillary: 144 mg/dL — ABNORMAL HIGH (ref 65–99)
Glucose-Capillary: 219 mg/dL — ABNORMAL HIGH (ref 65–99)
Glucose-Capillary: 223 mg/dL — ABNORMAL HIGH (ref 65–99)

## 2016-04-23 MED ORDER — AMLODIPINE BESYLATE 5 MG PO TABS: 5.0000 mg | ORAL_TABLET | Freq: Every day | ORAL | Status: DC

## 2016-04-23 MED ORDER — RISPERIDONE 1 MG PO TBDP: 1.0000 mg | ORAL_TABLET | Freq: Every day | ORAL | Status: DC

## 2016-04-23 MED ORDER — ENSURE ENLIVE PO LIQD: 237.0000 mL | Freq: Two times a day (BID) | ORAL | Status: DC

## 2016-04-23 MED ORDER — ENOXAPARIN SODIUM 40 MG/0.4ML ~~LOC~~ SOLN: 40.0000 mg | SUBCUTANEOUS | Status: DC

## 2016-04-23 MED ORDER — METOPROLOL TARTRATE 25 MG PO TABS: 25.0000 mg | ORAL_TABLET | Freq: Two times a day (BID) | ORAL | Status: DC

## 2016-04-23 NOTE — Progress Notes (Signed)
Patient is medically stale for D/C to [facility] today. Per [name] admissions coordinator at [facility] patient will go to room 210-B. RN will call report at [phone] and arrange EMS for transport. Clinical Education officer, museum (CSW) sent D/C Summary, FL2 and D/C Packet to [employer] via [employer]. CSW contacted patient's son [name] and made him aware of above. Please reconsult if future social work needs arise. CSW signing off.   Blima Rich, LCSW [phone]

## 2016-04-23 NOTE — Progress Notes (Signed)
Physical Therapy Treatment Patient Details Name: Sandra Kaufman MRN: [id number] DOB: 08-05-1932 Today's Date: 04/23/2016    History of Present Illness 82 y/o female here after a fall at home with L hip fracture requiring a hemiarthroplasty    PT Comments    Pt more alert and oriented today (although some confusion still noted) and able to follow directions fairly well and take steps from the bed to recliner with RW and 2 assist this morning.  Pt limited with functional mobility d/t c/o 8/10 L hip pain (nursing notified for pain meds).  Will progress pt with functional mobility and ambulation as appropriate next PT session.   Follow Up Recommendations  SNF     Equipment Recommendations  Rolling walker with 5" wheels    Recommendations for Other Services       Precautions / Restrictions Precautions Precautions: Fall;Posterior Hip Restrictions Weight Bearing Restrictions: Yes LLE Weight Bearing: Weight bearing as tolerated    Mobility  Bed Mobility Overal bed mobility: Needs Assistance Bed Mobility: Supine to Sit     Supine to sit: Max assist;HOB elevated     General bed mobility comments: with vc's and tactile cues pt able to bridge 2x's to scoot bottom towards side of bed; vc's required for technique supine to sit  Transfers Overall transfer level: Needs assistance Equipment used: Rolling walker (2 wheeled) Transfers: Sit to/from Stand Sit to Stand: Mod assist         General transfer comment: vc's required for hand and feet placement; x3 trials sit to/from stand  Ambulation/Gait Ambulation/Gait assistance: Min assist;Mod assist;+2 safety/equipment Ambulation Distance (Feet): 3 Feet (bed to recliner) Assistive device: Rolling walker (2 wheeled)   Gait velocity: decreased   General Gait Details: decreased stance time L LE; vc's and tactile cues required for increasing UE support through walker to off-weight L LE; assist required for RW  navigation   Stairs            Wheelchair Mobility    Modified Rankin (Stroke Patients Only)       Balance Overall balance assessment: Needs assistance Sitting-balance support: Bilateral upper extremity supported;Feet supported Sitting balance-Leahy Scale: Fair Sitting balance - Comments: initial posterior lean but pt able to self correct with vc's and tactile cues   Standing balance support: Bilateral upper extremity supported (on RW) Standing balance-Leahy Scale: Fair                      Cognition Arousal/Alertness: Awake/alert Behavior During Therapy: WFL for tasks assessed/performed Overall Cognitive Status: No family/caregiver present to determine baseline cognitive functioning Area of Impairment: Orientation (Oriented to person, place (with multiple choice), 2017 (not month/day), and knew she was here for hip fx (did not know she had surgery))     Memory: Decreased recall of precautions;Decreased short-term memory              Exercises      General Comments General comments (skin integrity, edema, etc.): L hip dressing in place  Pt agreeable to PT session.      Pertinent Vitals/Pain Pain Assessment: 0-10 Pain Score: 8  Pain Location: L hip Pain Descriptors / Indicators: Aching;Sore;Tender;Operative site guarding Pain Intervention(s): Limited activity within patient's tolerance;Monitored during session;Repositioned;Patient requesting pain meds-RN notified    Home Living                      Prior Function  PT Goals (current goals can now be found in the care plan section) Acute Rehab PT Goals PT Goal Formulation: With patient Time For Goal Achievement: 05/05/16 Potential to Achieve Goals: Fair Progress towards PT goals: Progressing toward goals    Frequency  BID    PT Plan Current plan remains appropriate    Co-evaluation             End of Session Equipment Utilized During Treatment: Gait  belt Activity Tolerance: Patient limited by pain Patient left: in chair;with call bell/phone within reach;with chair alarm set;with SCD's reapplied (B heels elevated via pillows)     Time: [id number] PT Time Calculation (min) (ACUTE ONLY): 25 min  Charges:  $Therapeutic Activity: 23-37 mins                    G CodesLeitha Bleak May 01, 2016, 10:16 AM Leitha Bleak, PT [phone]

## 2016-04-23 NOTE — Progress Notes (Signed)
DISCHARGE NOTE:  Report called to [facility]. EMS called for transportation. Pt waiting on EMS

## 2016-04-23 NOTE — Progress Notes (Signed)
  Subjective: 7 Days Post-Op Procedure(s) (LRB): ARTHROPLASTY BIPOLAR HIP (HEMIARTHROPLASTY) (Left) Patient reports pain as mild.   Patient seen in rounds with Dr. Roland Rack. Patient is well, and has had no acute complaints or problems. The patient is much improved with her confusion.  Normal conversation.   She is not complaining of a lot of pain. Plan is to go Skilled nursing facility after hospital stay.  Probable today. Negative for chest pain and shortness of breath Fever: no Gastrointestinal: Negative for nausea and vomiting  Objective: Vital signs in last 24 hours: Temp:  [98.1 F (36.7 C)-98.6 F (37 C)] 98.3 F (36.8 C) (04/27 0528) Pulse Rate:  [77-82] 77 (04/27 0528) Resp:  [18-20] 18 (04/27 0528) BP: (109-185)/(57-95) 157/95 mmHg (04/27 0528) SpO2:  [96 %-100 %] 96 % (04/27 0528)  Intake/Output from previous day:  Intake/Output Summary (Last 24 hours) at 04/23/16 0727 Last data filed at 04/23/16 0400  Gross per 24 hour  Intake    690 ml  Output      1 ml  Net    689 ml    Intake/Output this shift:    Labs:  Recent Labs  04/21/16 1220 04/23/16 0323  HGB 10.4* 10.1*    Recent Labs  04/21/16 1220 04/23/16 0323  WBC 8.1 8.0  RBC 3.53* 3.49*  HCT 30.3* 29.9*  PLT 273 309    Recent Labs  04/21/16 1220 04/23/16 0323  NA 136 139  K 3.4* 3.6  CL 103 107  CO2 25 25  BUN 16 18  CREATININE 0.61 0.74  GLUCOSE 211* 141*  CALCIUM 8.4* 8.1*   No results for input(s): LABPT, INR in the last 72 hours.   EXAM General - Patient is Alert and mildly confused with time Extremity - Sensation intact distally Dorsiflexion/Plantar flexion intact. The compartments are soft with no signs of cellulitis. Dressing/Incision - clean, dry, blood tinged and scant drainage Motor Function - intact, moving foot and toes well on exam. The patient ambulated 3 feet with physical therapy.  Past Medical History  Diagnosis Date  . Hypertension   . Hypercholesterolemia   .  Hypothyroidism   . COPD (chronic obstructive pulmonary disease) ([place])   . GERD (gastroesophageal reflux disease)     with esophageal stricture requiring dilatation x 2 (12/96 and 10/99)  . Arthritis   . Glaucoma   . Peripheral neuropathy ([place])   . History of chicken pox   . Diverticulosis   . Migraines   . Colon polyps   . Urinary incontinence   . Cancer ([place])     skin    Assessment/Plan: 7 Days Post-Op Procedure(s) (LRB): ARTHROPLASTY BIPOLAR HIP (HEMIARTHROPLASTY) (Left) Principal Problem:   Fracture of femoral neck, left (HCC) Active Problems:   Hypothyroidism   Hypercholesterolemia   Diabetes mellitus (HCC)   Hypertension   GERD (gastroesophageal reflux disease)   Chronic obstructive pulmonary disease (HCC)   Fracture, femur, neck, left, closed, initial encounter   Dementia with psychosis  Estimated body mass index is 29.1 kg/(m^2) as calculated from the following:   Height as of this encounter: 5\' 5"  (1.651 m).   Weight as of this encounter: 79.334 kg (174 lb 14.4 oz). Up with therapy Discharge to SNF when cleared medically.  DVT Prophylaxis - Lovenox, Foot Pumps and TED hose Weight-Bearing as tolerated to left leg  Reche Dixon, PA-C Orthopaedic Surgery 04/23/2016, 7:27 AM

## 2016-04-23 NOTE — Telephone Encounter (Signed)
HFU, Pt is being discharged from hospital on today. Dx is left hip fracture. No appt avail to sch. Let me know where to sch. Call pt @ [phone]. Thank you!

## 2016-04-23 NOTE — Progress Notes (Signed)
EMS here to transport pt. 

## 2016-04-23 NOTE — Telephone Encounter (Signed)
OK. I called pt too and no answer I'll keep trying.

## 2016-04-23 NOTE — Telephone Encounter (Signed)
Pt is being discharged to rehab.  Patient needs to call the office and schedule an appointment once discharged home.  Attempted to call patient on number provided, no answer, no voicemail.

## 2016-04-23 NOTE — Clinical Social Work Placement (Signed)
   CLINICAL SOCIAL WORK PLACEMENT  NOTE  Date:  04/23/2016  Patient Details  Name: Sandra Kaufman MRN: [id number] Date of Birth: 05/14/1932  Clinical Social Work is seeking post-discharge placement for this patient at the [facility] level of care (*CSW will initial, date and re-position this form in  chart as items are completed):  Yes   Patient/family provided with [place] Work Department's list of facilities offering this level of care within the geographic area requested by the patient (or if unable, by the patient's family).  Yes   Patient/family informed of their freedom to choose among providers that offer the needed level of care, that participate in Medicare, Medicaid or managed care program needed by the patient, have an available bed and are willing to accept the patient.  Yes   Patient/family informed of 's ownership interest in [facility] and [facility], as well as of the fact that they are under no obligation to receive care at these facilities.  PASRR submitted to EDS on       PASRR number received on       Existing PASRR number confirmed on 04/17/16     FL2 transmitted to all facilities in geographic area requested by pt/family on 04/17/16     FL2 transmitted to all facilities within larger geographic area on       Patient informed that his/her managed care company has contracts with or will negotiate with certain facilities, including the following:        Yes   Patient/family informed of bed offers received.  Patient chooses bed at  [facility] )     Physician recommends and patient chooses bed at      Patient to be transferred to  [facility] ) on 04/23/16.  Patient to be transferred to facility by  [facility] )     Patient family notified on 04/23/16 of transfer.  Name of family member notified:   (Patient's son [name] is aware of D/C today. )     PHYSICIAN       Additional  Comment:    _______________________________________________ Loralyn Freshwater, LCSW 04/23/2016, 11:55 AM

## 2016-04-23 NOTE — Discharge Summary (Signed)
[place] [facility] [place] NAME: Sandra Kaufman    MR#:  [id number]  DATE OF BIRTH:  June 28, 1932  DATE OF ADMISSION:  04/15/2016 ADMITTING PHYSICIAN: Lance Coon, MD  DATE OF DISCHARGE: 04/23/2016  PRIMARY CARE PHYSICIAN: Einar Pheasant, MD    ADMISSION DIAGNOSIS:  Hip fracture requiring operative repair, left, closed, initial encounter ([place]) [S72.002A]  DISCHARGE DIAGNOSIS:  Principal Problem:   Fracture of femoral neck, left ([place]) Active Problems:   Hypothyroidism   Hypercholesterolemia   Diabetes mellitus ([place])   Hypertension   GERD (gastroesophageal reflux disease)   Chronic obstructive pulmonary disease (HCC)   Fracture, femur, neck, left, closed, initial encounter   Dementia with psychosis   SECONDARY DIAGNOSIS:   Past Medical History  Diagnosis Date  . Hypertension   . Hypercholesterolemia   . Hypothyroidism   . COPD (chronic obstructive pulmonary disease) ([place])   . GERD (gastroesophageal reflux disease)     with esophageal stricture requiring dilatation x 2 (12/96 and 10/99)  . Arthritis   . Glaucoma   . Peripheral neuropathy ([place])   . History of chicken pox   . Diverticulosis   . Migraines   . Colon polyps   . Urinary incontinence   . Cancer [facility])     skin    HOSPITAL COURSE:   82 old female with past medical history significant for dementia, hypertension, hypothyroidism was brought in secondary to a mechanical fall and noted to have a left femoral fracture.  #1 Displaced left femoral neck fracture-appreciate orthopedic consult. Postoperative day 7 today -Slight drop in hemoglobin, expected postop anemia. No indication for transfusion. -Continue pain management. -Physical therapy and DVT prophylaxis.  #2 Dysphagia- Had barium swallow due to concern for esophageal stricture in the past. -Barium swallow showing presbyesophagus without any significant stricture but has large hiatal hernia with narrowing at  the end of esophagus. Could be stricture - Appreciate GI consult- EGD likely as outpatient-as able to eat soft diet with meds crushed for now -tolerating dysphagia diet well -Aspiration precautions.  #3 UTI- on rocephin- finished 5 days. Repeat UA normal  #4 GERD-continue Protonix  #5 hypertension-on metoprolol and Norvasc added as blood pressure is elevated  #6 Acute Delirium- on top of dementia, likely worse dementia at baseline - no other causes found- no uti- cleared after ABx, cxr with no acute findings - started risperidone at bedtime with much improvement as she is able to sleep at night, -Repeat CT head normal without any acute findings -Appreciate psych consult. Continue Zoloft for her depression.  Patient's delirium is much improved today. She is close to her baseline. Will be discharged to rehabilitation today.  DISCHARGE CONDITIONS:   Guarded  CONSULTS OBTAINED:  Treatment Team:  Corky Mull, MD Fredonia Highland, MD Gonzella Lex, MD  DRUG ALLERGIES:   Allergies  Allergen Reactions  . Tramadol Nausea And Vomiting    DISCHARGE MEDICATIONS:   Current Discharge Medication List    START taking these medications   Details  amLODipine (NORVASC) 5 MG tablet Take 1 tablet (5 mg total) by mouth daily. Qty: 30 tablet, Refills: 2    docusate sodium (COLACE) 100 MG capsule Take 1 capsule (100 mg total) by mouth 2 (two) times daily. Qty: 10 capsule, Refills: 0    enoxaparin (LOVENOX) 40 MG/0.4ML injection Inject 0.4 mLs (40 mg total) into the skin daily. X 8 more days Qty: 4 mL, Refills: 0    feeding supplement,  ENSURE ENLIVE, (ENSURE ENLIVE) LIQD Take 237 mLs by mouth 2 (two) times daily between meals. Qty: 237 mL, Refills: 12    metoprolol tartrate (LOPRESSOR) 25 MG tablet Take 1 tablet (25 mg total) by mouth 2 (two) times daily. Qty: 60 tablet, Refills: 2    oxyCODONE (OXY IR/ROXICODONE) 5 MG immediate release tablet Take 1 tablet (5 mg total) by mouth  every 6 (six) hours as needed for breakthrough pain. Qty: 30 tablet, Refills: 0    risperiDONE (RISPERDAL M-TABS) 1 MG disintegrating tablet Take 1 tablet (1 mg total) by mouth at bedtime. Qty: 30 tablet, Refills: 0      CONTINUE these medications which have NOT CHANGED   Details  albuterol (PROVENTIL HFA;VENTOLIN HFA) 108 (90 BASE) MCG/ACT inhaler Inhale 2 puffs into the lungs every 6 (six) hours as needed for wheezing or shortness of breath. Qty: 1 Inhaler, Refills: 2    aspirin 325 MG tablet Take 325 mg by mouth daily.    atorvastatin (LIPITOR) 40 MG tablet Take 40 mg by mouth at bedtime.    betamethasone valerate ointment (VALISONE) 0.1 % Apply 1 application topically 2 (two) times daily. Do not use for more than 2 weeks consecutively. Qty: 30 g, Refills: 0    bimatoprost (LUMIGAN) 0.01 % SOLN Place 1 drop into both eyes at bedtime.    brinzolamide (AZOPT) 1 % ophthalmic suspension Place 1 drop into both eyes 2 (two) times daily.     Coenzyme Q10 (CO Q10) 100 MG CAPS Take 100 mg by mouth daily.     fluticasone (FLONASE) 50 MCG/ACT nasal spray Place 2 sprays into both nostrils daily.    levothyroxine (SYNTHROID, LEVOTHROID) 100 MCG tablet Take 100 mcg by mouth daily before breakfast.    memantine (NAMENDA) 10 MG tablet Take 10 mg by mouth 2 (two) times daily.    metFORMIN (GLUCOPHAGE) 500 MG tablet Take 1 tablet (500 mg total) by mouth daily. Qty: 30 tablet, Refills: 2    pantoprazole (PROTONIX) 40 MG tablet Take 1 tablet (40 mg total) by mouth daily. Qty: 30 tablet, Refills: 11    psyllium (METAMUCIL) 0.52 G capsule Take 2.08 g by mouth 2 (two) times daily.     sertraline (ZOLOFT) 100 MG tablet Take 100 mg by mouth daily.    triamcinolone cream (KENALOG) 0.1 % Apply 1 application topically 2 (two) times daily as needed (for itching).    vitamin B-12 (CYANOCOBALAMIN) 1000 MCG tablet Take 2,000 mcg by mouth daily.      STOP taking these medications     amitriptyline  (ELAVIL) 10 MG tablet          DISCHARGE INSTRUCTIONS:   1. PCP follow-up in 1- 2 weeks 2. Orthopedic follow-up in 1 week  If you experience worsening of your admission symptoms, develop shortness of breath, life threatening emergency, suicidal or homicidal thoughts you must seek medical attention immediately by calling 911 or calling your MD immediately  if symptoms less severe.  You Must read complete instructions/literature along with all the possible adverse reactions/side effects for all the Medicines you take and that have been prescribed to you. Take any new Medicines after you have completely understood and accept all the possible adverse reactions/side effects.   Please note  You were cared for by a hospitalist during your hospital stay. If you have any questions about your discharge medications or the care you received while you were in the hospital after you are discharged, you can call the unit  and asked to speak with the hospitalist on call if the hospitalist that took care of you is not available. Once you are discharged, your primary care physician will handle any further medical issues. Please note that NO REFILLS for any discharge medications will be authorized once you are discharged, as it is imperative that you return to your primary care physician (or establish a relationship with a primary care physician if you do not have one) for your aftercare needs so that they can reassess your need for medications and monitor your lab values.    Today   CHIEF COMPLAINT:   Chief Complaint  Patient presents with  . Fall     VITAL SIGNS:  Blood pressure 148/82, pulse 96, temperature 98.8 F (37.1 C), temperature source Oral, resp. rate 19, height 5\' 5"  (1.651 m), weight 79.334 kg (174 lb 14.4 oz), SpO2 96 %.  I/O:   Intake/Output Summary (Last 24 hours) at 04/23/16 0934 Last data filed at 04/23/16 0400  Gross per 24 hour  Intake    690 ml  Output      1 ml  Net     689 ml    PHYSICAL EXAMINATION:   Physical Exam  GENERAL: 82 y.o.-year-old elderly patient lying in the bed with no acute distress. Patient is more alert today. Seems to be at her baseline. EYES: Pupils equal, round, reactive to light and accommodation. No scleral icterus. Extraocular muscles intact.  HEENT: Head atraumatic, normocephalic. Oropharynx and nasopharynx clear.  NECK: Supple, no jugular venous distention. No thyroid enlargement, no tenderness.  LUNGS: Normal breath sounds bilaterally, no wheezing, rales,rhonchi or crepitation. No use of accessory muscles of respiration. Decreased bibasilar breath sounds. CARDIOVASCULAR: S1, S2 normal. No rubs, or gallops. 3/6 systolic murmur present. ABDOMEN: Soft, nontender, nondistended. Bowel sounds present. No organomegaly or mass.  EXTREMITIES: No pedal edema, cyanosis, or clubbing. 1+ edema at the hips NEUROLOGIC: Cranial nerves intact, motor strength slightly decreased in left leg to pain. Sensation is intact. PSYCHIATRIC: The patient is alert and oriented 2-3 and is at her baseline. Very pleasant to interact with today SKIN: No obvious rash, lesion, or ulcer.   DATA REVIEW:   CBC  Recent Labs Lab 04/23/16 0323  WBC 8.0  HGB 10.1*  HCT 29.9*  PLT 309    Chemistries   Recent Labs Lab 04/23/16 0323  NA 139  K 3.6  CL 107  CO2 25  GLUCOSE 141*  BUN 18  CREATININE 0.74  CALCIUM 8.1*  MG 2.0    Cardiac Enzymes No results for input(s): TROPONINI in the last 168 hours.  Microbiology Results  Results for orders placed or performed during the hospital encounter of 04/15/16  Surgical pcr screen     Status: Abnormal   Collection Time: 04/16/16  2:24 AM  Result Value Ref Range Status   MRSA, PCR NEGATIVE NEGATIVE Final   Staphylococcus aureus POSITIVE (A) NEGATIVE Final    Comment:        The Xpert SA Assay (FDA approved for NASAL specimens in patients over 80 years of age), is one component of a  comprehensive surveillance program.  Test performance has been validated by [facility] for patients greater than or equal to 69 year old. It is not intended to diagnose infection nor to guide or monitor treatment.     RADIOLOGY:  Ct Head Wo Contrast  04/22/2016  CLINICAL DATA:  Altered mental status EXAM: CT HEAD WITHOUT CONTRAST TECHNIQUE: Contiguous axial images were obtained  from the base of the skull through the vertex without intravenous contrast. COMPARISON:  04/11/2016 FINDINGS: Brain: No intracranial hemorrhage, mass effect or midline shift. Stable atrophy and chronic white matter disease. No definite acute cortical infarction. No mass lesion is noted on this unenhanced scan. Stable small lacunar infarct in left thalamus. Stable tiny lacunar infarct in right internal capsule. Vascular: Atherosclerotic calcifications of carotid siphon again noted. Skull: No skull fracture is noted. Persistent residual soft tissue swelling and subcutaneous stranding with small subcutaneous hematoma right supraorbital scalp. Sinuses/Orbits: No intraorbital hematoma. No paranasal sinuses air-fluid levels. Other: None IMPRESSION: No acute intracranial abnormality. Stable atrophy and chronic white matter disease. Stable small lacunar infarcts in right internal capsule and left thalamus. No definite acute cortical infarction. There is residual soft tissue swelling and subcutaneous stranding with small subcutaneous hematoma in right supraorbital scalp. Electronically Signed   By: Lahoma Crocker M.D.   On: 04/22/2016 09:18    EKG:   Orders placed or performed during the hospital encounter of 04/15/16  . EKG 12-Lead  . EKG 12-Lead      Management plans discussed with the patient, family and they are in agreement.  CODE STATUS:     Code Status Orders        Start     Ordered   04/16/16 0206  Do not attempt resuscitation (DNR)   Continuous    Question Answer Comment  In the event of cardiac or  respiratory ARREST Do not call a "code blue"   In the event of cardiac or respiratory ARREST Do not perform Intubation, CPR, defibrillation or ACLS   In the event of cardiac or respiratory ARREST Use medication by any route, position, wound care, and other measures to relive pain and suffering. May use oxygen, suction and manual treatment of airway obstruction as needed for comfort.      04/16/16 0205    Code Status History    Date Active Date Inactive Code Status Order ID Comments User Context   This patient has a current code status but no historical code status.    Advance Directive Documentation        Most Recent Value   Type of Advance Directive  Living will   Pre-existing out of facility DNR order (yellow form or pink MOST form)     "MOST" Form in Place?        TOTAL TIME TAKING CARE OF THIS PATIENT: 38 minutes.    Gladstone Lighter M.D on 04/23/2016 at 9:34 AM  Between 7am to 6pm - Pager - [phone]  After 6pm go to [email or web address] - password [email or web address] [place] [facility]  Office  [phone]  CC: Primary care physician; Einar Pheasant, MD

## 2016-04-23 NOTE — Progress Notes (Signed)
Speech Language Pathology Treatment: Dysphagia  Patient Details Name: Sandra Kaufman MRN: [id number] DOB: 1932-04-28 Today's Date: 04/23/2016 Time: [id number] SLP Time Calculation (min) (ACUTE ONLY): 45 min  Assessment / Plan / Recommendation Clinical Impression    Pt appeared less drowsy and confused during this session today. She required mod. verbal/tactile/visual cues as well as hand over hand assist to participate taking po trials. Pt exhibited no significant oral phase deficits w/ overall adequate timing of A-P transfer and oral clearing. Pt was encouraged to help hold the cup for drinking to participate in the task of drinking to increase awareness and safety w/ such. Pt was distracted at times. Distractions were minimized (closed door, turned off tv) and pt redirected to task. No overt s/s of aspiration were noted w/ the trials given; however, pt's decline in Cognitive status and drowsiness puts her at higher risk for aspiration at this time. Unsure if pt's presentation is related to her Cognitive decline - exacerbation of Dementia. Psychiatry is following.  Recommend continue w/ pureed diet consistency (d/t GI Esophageal deficits) w/ thin liquids; strict Reflux and aspiration precautions; Meds in Puree - Crushed; feeding assistance w/ checking for oral clearing. Pt would benefit from Dietician consult for Drink supplement for support as per results of CXR. Rec. F/u w/ GI for any further education on Reflux/Esophageal motility impact.  Education was given to Son on impact of Esophageal dysmotility, recommendation for drink supplements as per results of CXR and Radiologist's recommendation, foods/milkshakes made w/ Ensure, use of condiments to moisten well, aspiration and Reflux precautions. Son agreed.     HPI HPI: Pt is a 82 y.o. female with multiple medical problems including Dementia, Esophageal Strictures and Dilitations x3 per Son, Acid Reflux per Son, hiatal hernia per chart CXR note,  COPD, hypertension, hypothyroidism, GERD, and hypercholesterolemia and other medical issues who lives at home BUT w/ 24/7 Caregivers per Son's report. She uses a walker for ambulation. Apparently, she tripped and fell while trying to put her slippers on yesterday evening, landing on her left hip. She was brought to the emergency room where x-rays demonstrated a displaced left femoral neck fracture. Subsequent, she was admitted to the hospitalist service for medical stabilization in preparation for definitive management of her injury. The patient denies any associated injuries resulting from the fall last evening. However, she did fall 2 days earlier and struck the right side of her face, resulting in ecchymosis around the right eye and forehead region. Patient was with her Caretaker when she was walking outside(pt stated she was "leaving and walking up the street to my other house" when she fell in the street); it was reported that she tripped. Pt stated she struck her head on the concrete. She was evaluated then at [facility] emergency room then discharged after a thorough workup. The patient denies any lightheadedness, dizziness, shortness of breath, or other symptoms that may have precipitated the fall last evening. On a GI note, pt has a h/o Esophageal dysmotility and Esophageal strictures w/ dilitations - Son reported GI did not want to "do another dilitation if he didn't have to" d/t the multiple dilitations done and her age. Son reported pt tends to eat foods (Breads, Meats) she shouldn't w/ the degree of Esphageal dysmotility issues. Pt is on a PPI. Son reported pt has 24/7 Caregivers at home caring for her. On 04/17/16, pt had a GI Barium study performed showing Large hiatal hernia with fairly small passage through the esophageal hiatus into  the subdiaphragmatic portion of the stomach. This allowed passage of the liquid barium but solid foods would be problematic. Pt is tolerating a  Pureed diet w/ thin liquids, supplement drinks.       SLP Plan  Continue with current plan of care     Recommendations  Diet recommendations: Dysphagia 1 (puree);Thin liquid Liquids provided via: Cup;Straw Medication Administration: Crushed with puree Supervision: Full supervision/cueing for compensatory strategies;Trained caregiver to feed patient Compensations: Minimize environmental distractions;Slow rate;Small sips/bites (check for oral clearing as necessary; full alert/awake) Postural Changes and/or Swallow Maneuvers: Seated upright 90 degrees;Upright 30-60 min after meal (Reflux precautions)             General recommendations:  (Dietician f/u at [facility]) Oral Care Recommendations: Oral care BID;Staff/trained caregiver to provide oral care Follow up Recommendations: Skilled Nursing facility Plan: Continue with current plan of care     [place], [place], CCC-SLP  Watson,Katherine 04/23/2016, 11:35 AM

## 2016-04-24 ENCOUNTER — Telehealth: Payer: Self-pay | Admitting: Internal Medicine

## 2016-04-24 DIAGNOSIS — J449 Chronic obstructive pulmonary disease, unspecified: Secondary | ICD-10-CM | POA: Diagnosis not present

## 2016-04-24 DIAGNOSIS — G9340 Encephalopathy, unspecified: Secondary | ICD-10-CM | POA: Diagnosis not present

## 2016-04-24 DIAGNOSIS — M810 Age-related osteoporosis without current pathological fracture: Secondary | ICD-10-CM | POA: Diagnosis not present

## 2016-04-24 LAB — GLUCOSE, CAPILLARY
Glucose-Capillary: 140 mg/dL — ABNORMAL HIGH (ref 65–99)
Glucose-Capillary: 162 mg/dL — ABNORMAL HIGH (ref 65–99)
Glucose-Capillary: 146 mg/dL — ABNORMAL HIGH (ref 65–99)
Glucose-Capillary: 224 mg/dL — ABNORMAL HIGH (ref 65–99)

## 2016-04-24 NOTE — Telephone Encounter (Signed)
Spoke with [place] and verbalized immunization record on file.

## 2016-04-24 NOTE — Telephone Encounter (Signed)
Neshell or [phone] called from [facility] wanting to know when did the pt get her last Flu and Pneumonia shot? Thank you!

## 2016-04-25 LAB — GLUCOSE, CAPILLARY
Glucose-Capillary: 137 mg/dL — ABNORMAL HIGH (ref 65–99)
Glucose-Capillary: 143 mg/dL — ABNORMAL HIGH (ref 65–99)
Glucose-Capillary: 167 mg/dL — ABNORMAL HIGH (ref 65–99)
Glucose-Capillary: 129 mg/dL — ABNORMAL HIGH (ref 65–99)
Glucose-Capillary: 165 mg/dL — ABNORMAL HIGH (ref 65–99)

## 2016-04-26 LAB — GLUCOSE, CAPILLARY
Glucose-Capillary: 129 mg/dL — ABNORMAL HIGH (ref 65–99)
Glucose-Capillary: 146 mg/dL — ABNORMAL HIGH (ref 65–99)
Glucose-Capillary: 190 mg/dL — ABNORMAL HIGH (ref 65–99)
Glucose-Capillary: 155 mg/dL — ABNORMAL HIGH (ref 65–99)

## 2016-04-27 ENCOUNTER — Encounter
Admission: RE | Admit: 2016-04-27 | Discharge: 2016-04-27 | Disposition: A | Discharge status: Home / Self Care | Payer: Medicare Other | Source: Ambulatory Visit | Attending: Internal Medicine | Admitting: Internal Medicine

## 2016-04-27 LAB — GLUCOSE, CAPILLARY: Glucose-Capillary: 130 mg/dL — ABNORMAL HIGH (ref 65–99)

## 2016-04-28 LAB — GLUCOSE, CAPILLARY
Glucose-Capillary: 169 mg/dL — ABNORMAL HIGH (ref 65–99)
Glucose-Capillary: 195 mg/dL — ABNORMAL HIGH (ref 65–99)
Glucose-Capillary: 208 mg/dL — ABNORMAL HIGH (ref 65–99)
Glucose-Capillary: 128 mg/dL — ABNORMAL HIGH (ref 65–99)
Glucose-Capillary: 156 mg/dL — ABNORMAL HIGH (ref 65–99)

## 2016-04-29 ENCOUNTER — Other Ambulatory Visit: Payer: Self-pay | Admitting: Internal Medicine

## 2016-04-29 LAB — GLUCOSE, CAPILLARY
Glucose-Capillary: 134 mg/dL — ABNORMAL HIGH (ref 65–99)
Glucose-Capillary: 187 mg/dL — ABNORMAL HIGH (ref 65–99)
Glucose-Capillary: 153 mg/dL — ABNORMAL HIGH (ref 65–99)
Glucose-Capillary: 164 mg/dL — ABNORMAL HIGH (ref 65–99)
Glucose-Capillary: 120 mg/dL — ABNORMAL HIGH (ref 65–99)
Glucose-Capillary: 181 mg/dL — ABNORMAL HIGH (ref 65–99)

## 2016-04-30 LAB — CBC WITH DIFFERENTIAL/PLATELET
Basophils Absolute: 0.1 10*3/uL (ref 0–0.1)
Basophils Relative: 1 %
Eosinophils Absolute: 0.6 10*3/uL (ref 0–0.7)
Eosinophils Relative: 6 %
HCT: 31.2 % — ABNORMAL LOW (ref 35.0–47.0)
Hemoglobin: 10.5 g/dL — ABNORMAL LOW (ref 12.0–16.0)
Lymphocytes Relative: 15 %
Lymphs Abs: 1.4 10*3/uL (ref 1.0–3.6)
MCH: 29.5 pg (ref 26.0–34.0)
MCHC: 33.7 g/dL (ref 32.0–36.0)
MCV: 87.8 fL (ref 80.0–100.0)
Monocytes Absolute: 0.7 10*3/uL (ref 0.2–0.9)
Monocytes Relative: 7 %
Neutro Abs: 6.8 10*3/uL — ABNORMAL HIGH (ref 1.4–6.5)
Neutrophils Relative %: 71 %
Platelets: 330 10*3/uL (ref 150–440)
RBC: 3.56 MIL/uL — ABNORMAL LOW (ref 3.80–5.20)
RDW: 14.8 % — ABNORMAL HIGH (ref 11.5–14.5)
WBC: 9.5 10*3/uL (ref 3.6–11.0)

## 2016-04-30 LAB — COMPREHENSIVE METABOLIC PANEL
ALT: 12 U/L — ABNORMAL LOW (ref 14–54)
AST: 15 U/L (ref 15–41)
Albumin: 3.4 g/dL — ABNORMAL LOW (ref 3.5–5.0)
Alkaline Phosphatase: 71 U/L (ref 38–126)
Anion gap: 11 (ref 5–15)
BUN: 13 mg/dL (ref 6–20)
CO2: 23 mmol/L (ref 22–32)
Calcium: 8.6 mg/dL — ABNORMAL LOW (ref 8.9–10.3)
Chloride: 100 mmol/L — ABNORMAL LOW (ref 101–111)
Creatinine, Ser: 0.74 mg/dL (ref 0.44–1.00)
GFR calc Af Amer: >60 mL/min (ref >60)
GFR calc non Af Amer: >60 mL/min (ref >60)
Glucose, Bld: 130 mg/dL — ABNORMAL HIGH (ref 65–99)
Potassium: 3.6 mmol/L (ref 3.5–5.1)
Sodium: 134 mmol/L — ABNORMAL LOW (ref 135–145)
Total Bilirubin: 1 mg/dL (ref 0.3–1.2)
Total Protein: 6.4 g/dL — ABNORMAL LOW (ref 6.5–8.1)

## 2016-04-30 LAB — GLUCOSE, CAPILLARY
Glucose-Capillary: 132 mg/dL — ABNORMAL HIGH (ref 65–99)
Glucose-Capillary: 172 mg/dL — ABNORMAL HIGH (ref 65–99)
Glucose-Capillary: 165 mg/dL — ABNORMAL HIGH (ref 65–99)
Glucose-Capillary: 164 mg/dL — ABNORMAL HIGH (ref 65–99)

## 2016-04-30 LAB — TSH: TSH: 2.586 u[IU]/mL (ref 0.350–4.500)

## 2016-04-30 LAB — FOLATE: Folate: 4.5 ng/mL — ABNORMAL LOW (ref >5.9)

## 2016-04-30 LAB — VITAMIN B12: Vitamin B-12: 1780 pg/mL — ABNORMAL HIGH (ref 180–914)

## 2016-04-30 LAB — MAGNESIUM: Magnesium: 1.4 mg/dL — ABNORMAL LOW (ref 1.7–2.4)

## 2016-04-30 NOTE — Telephone Encounter (Signed)
ok'd rx for levothyroxine #90 with one refill.

## 2016-05-01 LAB — GLUCOSE, CAPILLARY
Glucose-Capillary: 135 mg/dL — ABNORMAL HIGH (ref 65–99)
Glucose-Capillary: 134 mg/dL — ABNORMAL HIGH (ref 65–99)
Glucose-Capillary: 156 mg/dL — ABNORMAL HIGH (ref 65–99)
Glucose-Capillary: 123 mg/dL — ABNORMAL HIGH (ref 65–99)

## 2016-05-01 LAB — VITAMIN D 25 HYDROXY (VIT D DEFICIENCY, FRACTURES): Vit D, 25-Hydroxy: 15.8 ng/mL — ABNORMAL LOW (ref 30.0–100.0)

## 2016-05-02 LAB — GLUCOSE, CAPILLARY
Glucose-Capillary: 134 mg/dL — ABNORMAL HIGH (ref 65–99)
Glucose-Capillary: 122 mg/dL — ABNORMAL HIGH (ref 65–99)
Glucose-Capillary: 134 mg/dL — ABNORMAL HIGH (ref 65–99)
Glucose-Capillary: 136 mg/dL — ABNORMAL HIGH (ref 65–99)

## 2016-05-03 LAB — GLUCOSE, CAPILLARY
Glucose-Capillary: 128 mg/dL — ABNORMAL HIGH (ref 65–99)
Glucose-Capillary: 170 mg/dL — ABNORMAL HIGH (ref 65–99)
Glucose-Capillary: 135 mg/dL — ABNORMAL HIGH (ref 65–99)
Glucose-Capillary: 195 mg/dL — ABNORMAL HIGH (ref 65–99)

## 2016-05-04 DIAGNOSIS — S72002D Fracture of unspecified part of neck of left femur, subsequent encounter for closed fracture with routine healing: Secondary | ICD-10-CM | POA: Diagnosis not present

## 2016-05-04 LAB — GLUCOSE, CAPILLARY
Glucose-Capillary: 149 mg/dL — ABNORMAL HIGH (ref 65–99)
Glucose-Capillary: 157 mg/dL — ABNORMAL HIGH (ref 65–99)
Glucose-Capillary: 131 mg/dL — ABNORMAL HIGH (ref 65–99)
Glucose-Capillary: 168 mg/dL — ABNORMAL HIGH (ref 65–99)

## 2016-05-05 LAB — GLUCOSE, CAPILLARY
Glucose-Capillary: 120 mg/dL — ABNORMAL HIGH (ref 65–99)
Glucose-Capillary: 174 mg/dL — ABNORMAL HIGH (ref 65–99)
Glucose-Capillary: 124 mg/dL — ABNORMAL HIGH (ref 65–99)
Glucose-Capillary: 141 mg/dL — ABNORMAL HIGH (ref 65–99)

## 2016-05-06 LAB — GLUCOSE, CAPILLARY
Glucose-Capillary: 134 mg/dL — ABNORMAL HIGH (ref 65–99)
Glucose-Capillary: 148 mg/dL — ABNORMAL HIGH (ref 65–99)
Glucose-Capillary: 143 mg/dL — ABNORMAL HIGH (ref 65–99)
Glucose-Capillary: 154 mg/dL — ABNORMAL HIGH (ref 65–99)

## 2016-05-07 LAB — GLUCOSE, CAPILLARY
Glucose-Capillary: 145 mg/dL — ABNORMAL HIGH (ref 65–99)
Glucose-Capillary: 158 mg/dL — ABNORMAL HIGH (ref 65–99)

## 2016-05-08 LAB — GLUCOSE, CAPILLARY
Glucose-Capillary: 122 mg/dL — ABNORMAL HIGH (ref 65–99)
Glucose-Capillary: 139 mg/dL — ABNORMAL HIGH (ref 65–99)
Glucose-Capillary: 125 mg/dL — ABNORMAL HIGH (ref 65–99)
Glucose-Capillary: 145 mg/dL — ABNORMAL HIGH (ref 65–99)
Glucose-Capillary: 104 mg/dL — ABNORMAL HIGH (ref 65–99)
Glucose-Capillary: 169 mg/dL — ABNORMAL HIGH (ref 65–99)

## 2016-05-09 LAB — GLUCOSE, CAPILLARY
Glucose-Capillary: 132 mg/dL — ABNORMAL HIGH (ref 65–99)
Glucose-Capillary: 129 mg/dL — ABNORMAL HIGH (ref 65–99)
Glucose-Capillary: 104 mg/dL — ABNORMAL HIGH (ref 65–99)
Glucose-Capillary: 132 mg/dL — ABNORMAL HIGH (ref 65–99)

## 2016-05-10 LAB — GLUCOSE, CAPILLARY
Glucose-Capillary: 130 mg/dL — ABNORMAL HIGH (ref 65–99)
Glucose-Capillary: 137 mg/dL — ABNORMAL HIGH (ref 65–99)
Glucose-Capillary: 165 mg/dL — ABNORMAL HIGH (ref 65–99)
Glucose-Capillary: 170 mg/dL — ABNORMAL HIGH (ref 65–99)

## 2016-05-12 LAB — GLUCOSE, CAPILLARY
Glucose-Capillary: 137 mg/dL — ABNORMAL HIGH (ref 65–99)
Glucose-Capillary: 156 mg/dL — ABNORMAL HIGH (ref 65–99)
Glucose-Capillary: 117 mg/dL — ABNORMAL HIGH (ref 65–99)
Glucose-Capillary: 150 mg/dL — ABNORMAL HIGH (ref 65–99)
Glucose-Capillary: 127 mg/dL — ABNORMAL HIGH (ref 65–99)

## 2016-05-13 LAB — GLUCOSE, CAPILLARY
Glucose-Capillary: 171 mg/dL — ABNORMAL HIGH (ref 65–99)
Glucose-Capillary: 139 mg/dL — ABNORMAL HIGH (ref 65–99)

## 2016-05-14 LAB — GLUCOSE, CAPILLARY
Glucose-Capillary: 176 mg/dL — ABNORMAL HIGH (ref 65–99)
Glucose-Capillary: 121 mg/dL — ABNORMAL HIGH (ref 65–99)
Glucose-Capillary: 179 mg/dL — ABNORMAL HIGH (ref 65–99)

## 2016-05-15 LAB — GLUCOSE, CAPILLARY
Glucose-Capillary: 161 mg/dL — ABNORMAL HIGH (ref 65–99)
Glucose-Capillary: 194 mg/dL — ABNORMAL HIGH (ref 65–99)

## 2016-05-16 LAB — GLUCOSE, CAPILLARY
Glucose-Capillary: 131 mg/dL — ABNORMAL HIGH (ref 65–99)
Glucose-Capillary: 120 mg/dL — ABNORMAL HIGH (ref 65–99)

## 2016-05-17 LAB — GLUCOSE, CAPILLARY: Glucose-Capillary: 126 mg/dL — ABNORMAL HIGH (ref 65–99)

## 2016-05-18 LAB — GLUCOSE, CAPILLARY: Glucose-Capillary: 180 mg/dL — ABNORMAL HIGH (ref 65–99)

## 2016-05-19 DIAGNOSIS — F0391 Unspecified dementia with behavioral disturbance: Secondary | ICD-10-CM | POA: Diagnosis not present

## 2016-05-19 DIAGNOSIS — F419 Anxiety disorder, unspecified: Secondary | ICD-10-CM | POA: Diagnosis not present

## 2016-05-19 DIAGNOSIS — F329 Major depressive disorder, single episode, unspecified: Secondary | ICD-10-CM | POA: Diagnosis not present

## 2016-05-19 DIAGNOSIS — G4700 Insomnia, unspecified: Secondary | ICD-10-CM | POA: Diagnosis not present

## 2016-05-20 ENCOUNTER — Ambulatory Visit: Payer: Medicare Other | Admitting: Internal Medicine

## 2016-05-25 DIAGNOSIS — E1142 Type 2 diabetes mellitus with diabetic polyneuropathy: Secondary | ICD-10-CM | POA: Diagnosis not present

## 2016-05-25 DIAGNOSIS — M1990 Unspecified osteoarthritis, unspecified site: Secondary | ICD-10-CM | POA: Diagnosis not present

## 2016-05-25 DIAGNOSIS — F0390 Unspecified dementia without behavioral disturbance: Secondary | ICD-10-CM | POA: Diagnosis not present

## 2016-05-25 DIAGNOSIS — I10 Essential (primary) hypertension: Secondary | ICD-10-CM | POA: Diagnosis not present

## 2016-05-25 DIAGNOSIS — J449 Chronic obstructive pulmonary disease, unspecified: Secondary | ICD-10-CM | POA: Diagnosis not present

## 2016-05-25 DIAGNOSIS — S72002D Fracture of unspecified part of neck of left femur, subsequent encounter for closed fracture with routine healing: Secondary | ICD-10-CM | POA: Diagnosis not present

## 2016-05-27 DIAGNOSIS — M1990 Unspecified osteoarthritis, unspecified site: Secondary | ICD-10-CM | POA: Diagnosis not present

## 2016-05-27 DIAGNOSIS — I10 Essential (primary) hypertension: Secondary | ICD-10-CM | POA: Diagnosis not present

## 2016-05-27 DIAGNOSIS — E1142 Type 2 diabetes mellitus with diabetic polyneuropathy: Secondary | ICD-10-CM | POA: Diagnosis not present

## 2016-05-27 DIAGNOSIS — J449 Chronic obstructive pulmonary disease, unspecified: Secondary | ICD-10-CM | POA: Diagnosis not present

## 2016-05-27 DIAGNOSIS — F0390 Unspecified dementia without behavioral disturbance: Secondary | ICD-10-CM | POA: Diagnosis not present

## 2016-05-27 DIAGNOSIS — S72002D Fracture of unspecified part of neck of left femur, subsequent encounter for closed fracture with routine healing: Secondary | ICD-10-CM | POA: Diagnosis not present

## 2016-05-28 ENCOUNTER — Telehealth: Payer: Self-pay

## 2016-05-28 NOTE — Telephone Encounter (Signed)
Form received and gave to Dr. Lars Mage CMA

## 2016-05-28 NOTE — Telephone Encounter (Signed)
If had palliative care in hospital, ok to continue.

## 2016-05-28 NOTE — Telephone Encounter (Signed)
Spoke with the intake line to complete the referral, they will fax over a form to complete. thanks

## 2016-05-28 NOTE — Telephone Encounter (Signed)
[phone] Direct cell stephanie from Palliative care called.  Referral intake office, [phone] Hospice/Palliative care line  Patient had in facility Palliative care while at [facility], patient has been sent home and they are wanting to know if palliative care needs to be continued? They are willing to continue her care at home if you would like.  They just need a referral called in, if you have any questions you can call stephanie direct.  Thanks

## 2016-05-29 DIAGNOSIS — I10 Essential (primary) hypertension: Secondary | ICD-10-CM | POA: Diagnosis not present

## 2016-05-29 DIAGNOSIS — F0390 Unspecified dementia without behavioral disturbance: Secondary | ICD-10-CM | POA: Diagnosis not present

## 2016-05-29 DIAGNOSIS — E1142 Type 2 diabetes mellitus with diabetic polyneuropathy: Secondary | ICD-10-CM | POA: Diagnosis not present

## 2016-05-29 DIAGNOSIS — S72002D Fracture of unspecified part of neck of left femur, subsequent encounter for closed fracture with routine healing: Secondary | ICD-10-CM | POA: Diagnosis not present

## 2016-05-29 DIAGNOSIS — J449 Chronic obstructive pulmonary disease, unspecified: Secondary | ICD-10-CM | POA: Diagnosis not present

## 2016-05-29 DIAGNOSIS — M1990 Unspecified osteoarthritis, unspecified site: Secondary | ICD-10-CM | POA: Diagnosis not present

## 2016-06-01 DIAGNOSIS — M1990 Unspecified osteoarthritis, unspecified site: Secondary | ICD-10-CM | POA: Diagnosis not present

## 2016-06-01 DIAGNOSIS — S72002D Fracture of unspecified part of neck of left femur, subsequent encounter for closed fracture with routine healing: Secondary | ICD-10-CM | POA: Diagnosis not present

## 2016-06-01 DIAGNOSIS — I10 Essential (primary) hypertension: Secondary | ICD-10-CM | POA: Diagnosis not present

## 2016-06-01 DIAGNOSIS — E1142 Type 2 diabetes mellitus with diabetic polyneuropathy: Secondary | ICD-10-CM | POA: Diagnosis not present

## 2016-06-01 DIAGNOSIS — J449 Chronic obstructive pulmonary disease, unspecified: Secondary | ICD-10-CM | POA: Diagnosis not present

## 2016-06-01 DIAGNOSIS — F0390 Unspecified dementia without behavioral disturbance: Secondary | ICD-10-CM | POA: Diagnosis not present

## 2016-06-02 DIAGNOSIS — M1990 Unspecified osteoarthritis, unspecified site: Secondary | ICD-10-CM | POA: Diagnosis not present

## 2016-06-02 DIAGNOSIS — J449 Chronic obstructive pulmonary disease, unspecified: Secondary | ICD-10-CM | POA: Diagnosis not present

## 2016-06-02 DIAGNOSIS — I10 Essential (primary) hypertension: Secondary | ICD-10-CM | POA: Diagnosis not present

## 2016-06-02 DIAGNOSIS — E1142 Type 2 diabetes mellitus with diabetic polyneuropathy: Secondary | ICD-10-CM | POA: Diagnosis not present

## 2016-06-02 DIAGNOSIS — S72002D Fracture of unspecified part of neck of left femur, subsequent encounter for closed fracture with routine healing: Secondary | ICD-10-CM | POA: Diagnosis not present

## 2016-06-02 DIAGNOSIS — F0390 Unspecified dementia without behavioral disturbance: Secondary | ICD-10-CM | POA: Diagnosis not present

## 2016-06-03 ENCOUNTER — Telehealth: Payer: Self-pay | Admitting: Internal Medicine

## 2016-06-03 NOTE — Telephone Encounter (Signed)
Deborah [phone] called from [place] regarding if Palliative Care referral form was received? It was fax on 05/28/16. Thank you!

## 2016-06-04 ENCOUNTER — Telehealth: Payer: Self-pay | Admitting: *Deleted

## 2016-06-04 DIAGNOSIS — J449 Chronic obstructive pulmonary disease, unspecified: Secondary | ICD-10-CM | POA: Diagnosis not present

## 2016-06-04 DIAGNOSIS — F0390 Unspecified dementia without behavioral disturbance: Secondary | ICD-10-CM | POA: Diagnosis not present

## 2016-06-04 DIAGNOSIS — I10 Essential (primary) hypertension: Secondary | ICD-10-CM | POA: Diagnosis not present

## 2016-06-04 DIAGNOSIS — S72002D Fracture of unspecified part of neck of left femur, subsequent encounter for closed fracture with routine healing: Secondary | ICD-10-CM | POA: Diagnosis not present

## 2016-06-04 DIAGNOSIS — M1990 Unspecified osteoarthritis, unspecified site: Secondary | ICD-10-CM | POA: Diagnosis not present

## 2016-06-04 DIAGNOSIS — E1142 Type 2 diabetes mellitus with diabetic polyneuropathy: Secondary | ICD-10-CM | POA: Diagnosis not present

## 2016-06-04 NOTE — Telephone Encounter (Signed)
Sandra Kaufman from [facility] stated that the order for the palliative care was not signed by the physician . This will need to be signed and faxed back over. Fax [phone]

## 2016-06-04 NOTE — Telephone Encounter (Signed)
This is the one we discussed.   

## 2016-06-04 NOTE — Telephone Encounter (Signed)
Son states that he is okay continuing palliative care as long as insurance will cover it. Will fax signed form once its received.

## 2016-06-05 NOTE — Telephone Encounter (Signed)
Form completed, see other phone note that front office started.

## 2016-06-05 NOTE — Telephone Encounter (Signed)
Latoya, I had headed this form to you the other day, do you know if it got faxed back? thanks

## 2016-06-11 ENCOUNTER — Other Ambulatory Visit: Payer: Self-pay | Admitting: Internal Medicine

## 2016-06-11 DIAGNOSIS — E1142 Type 2 diabetes mellitus with diabetic polyneuropathy: Secondary | ICD-10-CM | POA: Diagnosis not present

## 2016-06-11 DIAGNOSIS — F0390 Unspecified dementia without behavioral disturbance: Secondary | ICD-10-CM | POA: Diagnosis not present

## 2016-06-11 DIAGNOSIS — M1990 Unspecified osteoarthritis, unspecified site: Secondary | ICD-10-CM | POA: Diagnosis not present

## 2016-06-11 DIAGNOSIS — I10 Essential (primary) hypertension: Secondary | ICD-10-CM | POA: Diagnosis not present

## 2016-06-11 DIAGNOSIS — S72002D Fracture of unspecified part of neck of left femur, subsequent encounter for closed fracture with routine healing: Secondary | ICD-10-CM | POA: Diagnosis not present

## 2016-06-11 DIAGNOSIS — J449 Chronic obstructive pulmonary disease, unspecified: Secondary | ICD-10-CM | POA: Diagnosis not present

## 2016-06-12 ENCOUNTER — Encounter: Payer: Self-pay | Admitting: Internal Medicine

## 2016-06-16 DIAGNOSIS — J449 Chronic obstructive pulmonary disease, unspecified: Secondary | ICD-10-CM | POA: Diagnosis not present

## 2016-06-16 DIAGNOSIS — E1142 Type 2 diabetes mellitus with diabetic polyneuropathy: Secondary | ICD-10-CM | POA: Diagnosis not present

## 2016-06-16 DIAGNOSIS — M1990 Unspecified osteoarthritis, unspecified site: Secondary | ICD-10-CM | POA: Diagnosis not present

## 2016-06-16 DIAGNOSIS — I10 Essential (primary) hypertension: Secondary | ICD-10-CM | POA: Diagnosis not present

## 2016-06-16 DIAGNOSIS — S72002D Fracture of unspecified part of neck of left femur, subsequent encounter for closed fracture with routine healing: Secondary | ICD-10-CM | POA: Diagnosis not present

## 2016-06-16 DIAGNOSIS — F0390 Unspecified dementia without behavioral disturbance: Secondary | ICD-10-CM | POA: Diagnosis not present

## 2016-06-17 DIAGNOSIS — S72002D Fracture of unspecified part of neck of left femur, subsequent encounter for closed fracture with routine healing: Secondary | ICD-10-CM | POA: Diagnosis not present

## 2016-06-17 DIAGNOSIS — I10 Essential (primary) hypertension: Secondary | ICD-10-CM | POA: Diagnosis not present

## 2016-06-17 DIAGNOSIS — M1990 Unspecified osteoarthritis, unspecified site: Secondary | ICD-10-CM | POA: Diagnosis not present

## 2016-06-17 DIAGNOSIS — J449 Chronic obstructive pulmonary disease, unspecified: Secondary | ICD-10-CM | POA: Diagnosis not present

## 2016-06-17 DIAGNOSIS — E1142 Type 2 diabetes mellitus with diabetic polyneuropathy: Secondary | ICD-10-CM | POA: Diagnosis not present

## 2016-06-17 DIAGNOSIS — F0390 Unspecified dementia without behavioral disturbance: Secondary | ICD-10-CM | POA: Diagnosis not present

## 2016-06-18 DIAGNOSIS — J449 Chronic obstructive pulmonary disease, unspecified: Secondary | ICD-10-CM | POA: Diagnosis not present

## 2016-06-18 DIAGNOSIS — M1990 Unspecified osteoarthritis, unspecified site: Secondary | ICD-10-CM | POA: Diagnosis not present

## 2016-06-18 DIAGNOSIS — S72002D Fracture of unspecified part of neck of left femur, subsequent encounter for closed fracture with routine healing: Secondary | ICD-10-CM | POA: Diagnosis not present

## 2016-06-18 DIAGNOSIS — E1142 Type 2 diabetes mellitus with diabetic polyneuropathy: Secondary | ICD-10-CM | POA: Diagnosis not present

## 2016-06-18 DIAGNOSIS — F0390 Unspecified dementia without behavioral disturbance: Secondary | ICD-10-CM | POA: Diagnosis not present

## 2016-06-18 DIAGNOSIS — I10 Essential (primary) hypertension: Secondary | ICD-10-CM | POA: Diagnosis not present

## 2016-06-19 DIAGNOSIS — M1990 Unspecified osteoarthritis, unspecified site: Secondary | ICD-10-CM | POA: Diagnosis not present

## 2016-06-19 DIAGNOSIS — E1142 Type 2 diabetes mellitus with diabetic polyneuropathy: Secondary | ICD-10-CM | POA: Diagnosis not present

## 2016-06-19 DIAGNOSIS — J449 Chronic obstructive pulmonary disease, unspecified: Secondary | ICD-10-CM | POA: Diagnosis not present

## 2016-06-19 DIAGNOSIS — S72002D Fracture of unspecified part of neck of left femur, subsequent encounter for closed fracture with routine healing: Secondary | ICD-10-CM | POA: Diagnosis not present

## 2016-06-19 DIAGNOSIS — F0390 Unspecified dementia without behavioral disturbance: Secondary | ICD-10-CM | POA: Diagnosis not present

## 2016-06-19 DIAGNOSIS — I10 Essential (primary) hypertension: Secondary | ICD-10-CM | POA: Diagnosis not present

## 2016-06-23 DIAGNOSIS — I10 Essential (primary) hypertension: Secondary | ICD-10-CM | POA: Diagnosis not present

## 2016-06-23 DIAGNOSIS — M1990 Unspecified osteoarthritis, unspecified site: Secondary | ICD-10-CM | POA: Diagnosis not present

## 2016-06-23 DIAGNOSIS — J449 Chronic obstructive pulmonary disease, unspecified: Secondary | ICD-10-CM | POA: Diagnosis not present

## 2016-06-23 DIAGNOSIS — S72002D Fracture of unspecified part of neck of left femur, subsequent encounter for closed fracture with routine healing: Secondary | ICD-10-CM | POA: Diagnosis not present

## 2016-06-23 DIAGNOSIS — E1142 Type 2 diabetes mellitus with diabetic polyneuropathy: Secondary | ICD-10-CM | POA: Diagnosis not present

## 2016-06-23 DIAGNOSIS — F0390 Unspecified dementia without behavioral disturbance: Secondary | ICD-10-CM | POA: Diagnosis not present

## 2016-06-25 DIAGNOSIS — F0390 Unspecified dementia without behavioral disturbance: Secondary | ICD-10-CM | POA: Diagnosis not present

## 2016-06-25 DIAGNOSIS — E1142 Type 2 diabetes mellitus with diabetic polyneuropathy: Secondary | ICD-10-CM | POA: Diagnosis not present

## 2016-06-25 DIAGNOSIS — J449 Chronic obstructive pulmonary disease, unspecified: Secondary | ICD-10-CM | POA: Diagnosis not present

## 2016-06-25 DIAGNOSIS — S72002D Fracture of unspecified part of neck of left femur, subsequent encounter for closed fracture with routine healing: Secondary | ICD-10-CM | POA: Diagnosis not present

## 2016-06-25 DIAGNOSIS — I10 Essential (primary) hypertension: Secondary | ICD-10-CM | POA: Diagnosis not present

## 2016-06-25 DIAGNOSIS — M1990 Unspecified osteoarthritis, unspecified site: Secondary | ICD-10-CM | POA: Diagnosis not present

## 2016-06-29 DIAGNOSIS — J449 Chronic obstructive pulmonary disease, unspecified: Secondary | ICD-10-CM | POA: Diagnosis not present

## 2016-06-29 DIAGNOSIS — E1142 Type 2 diabetes mellitus with diabetic polyneuropathy: Secondary | ICD-10-CM | POA: Diagnosis not present

## 2016-06-29 DIAGNOSIS — S72002D Fracture of unspecified part of neck of left femur, subsequent encounter for closed fracture with routine healing: Secondary | ICD-10-CM | POA: Diagnosis not present

## 2016-06-29 DIAGNOSIS — M1990 Unspecified osteoarthritis, unspecified site: Secondary | ICD-10-CM | POA: Diagnosis not present

## 2016-06-29 DIAGNOSIS — I10 Essential (primary) hypertension: Secondary | ICD-10-CM | POA: Diagnosis not present

## 2016-06-29 DIAGNOSIS — F0390 Unspecified dementia without behavioral disturbance: Secondary | ICD-10-CM | POA: Diagnosis not present

## 2016-07-02 DIAGNOSIS — I10 Essential (primary) hypertension: Secondary | ICD-10-CM | POA: Diagnosis not present

## 2016-07-02 DIAGNOSIS — S72002D Fracture of unspecified part of neck of left femur, subsequent encounter for closed fracture with routine healing: Secondary | ICD-10-CM | POA: Diagnosis not present

## 2016-07-02 DIAGNOSIS — J449 Chronic obstructive pulmonary disease, unspecified: Secondary | ICD-10-CM | POA: Diagnosis not present

## 2016-07-02 DIAGNOSIS — M1990 Unspecified osteoarthritis, unspecified site: Secondary | ICD-10-CM | POA: Diagnosis not present

## 2016-07-02 DIAGNOSIS — F0390 Unspecified dementia without behavioral disturbance: Secondary | ICD-10-CM | POA: Diagnosis not present

## 2016-07-02 DIAGNOSIS — E1142 Type 2 diabetes mellitus with diabetic polyneuropathy: Secondary | ICD-10-CM | POA: Diagnosis not present

## 2016-07-04 DIAGNOSIS — M1990 Unspecified osteoarthritis, unspecified site: Secondary | ICD-10-CM | POA: Diagnosis not present

## 2016-07-04 DIAGNOSIS — S72002D Fracture of unspecified part of neck of left femur, subsequent encounter for closed fracture with routine healing: Secondary | ICD-10-CM | POA: Diagnosis not present

## 2016-07-04 DIAGNOSIS — E1142 Type 2 diabetes mellitus with diabetic polyneuropathy: Secondary | ICD-10-CM | POA: Diagnosis not present

## 2016-07-04 DIAGNOSIS — J449 Chronic obstructive pulmonary disease, unspecified: Secondary | ICD-10-CM | POA: Diagnosis not present

## 2016-07-04 DIAGNOSIS — I10 Essential (primary) hypertension: Secondary | ICD-10-CM | POA: Diagnosis not present

## 2016-07-04 DIAGNOSIS — F0390 Unspecified dementia without behavioral disturbance: Secondary | ICD-10-CM | POA: Diagnosis not present

## 2016-07-06 DIAGNOSIS — F0390 Unspecified dementia without behavioral disturbance: Secondary | ICD-10-CM | POA: Diagnosis not present

## 2016-07-06 DIAGNOSIS — I10 Essential (primary) hypertension: Secondary | ICD-10-CM | POA: Diagnosis not present

## 2016-07-06 DIAGNOSIS — S72002D Fracture of unspecified part of neck of left femur, subsequent encounter for closed fracture with routine healing: Secondary | ICD-10-CM | POA: Diagnosis not present

## 2016-07-06 DIAGNOSIS — E1142 Type 2 diabetes mellitus with diabetic polyneuropathy: Secondary | ICD-10-CM | POA: Diagnosis not present

## 2016-07-06 DIAGNOSIS — J449 Chronic obstructive pulmonary disease, unspecified: Secondary | ICD-10-CM | POA: Diagnosis not present

## 2016-07-06 DIAGNOSIS — M1990 Unspecified osteoarthritis, unspecified site: Secondary | ICD-10-CM | POA: Diagnosis not present

## 2016-07-08 DIAGNOSIS — J449 Chronic obstructive pulmonary disease, unspecified: Secondary | ICD-10-CM | POA: Diagnosis not present

## 2016-07-08 DIAGNOSIS — E1142 Type 2 diabetes mellitus with diabetic polyneuropathy: Secondary | ICD-10-CM | POA: Diagnosis not present

## 2016-07-08 DIAGNOSIS — M1990 Unspecified osteoarthritis, unspecified site: Secondary | ICD-10-CM | POA: Diagnosis not present

## 2016-07-08 DIAGNOSIS — F0390 Unspecified dementia without behavioral disturbance: Secondary | ICD-10-CM | POA: Diagnosis not present

## 2016-07-08 DIAGNOSIS — S72002D Fracture of unspecified part of neck of left femur, subsequent encounter for closed fracture with routine healing: Secondary | ICD-10-CM | POA: Diagnosis not present

## 2016-07-08 DIAGNOSIS — I10 Essential (primary) hypertension: Secondary | ICD-10-CM | POA: Diagnosis not present

## 2016-07-13 ENCOUNTER — Telehealth: Payer: Self-pay | Admitting: Internal Medicine

## 2016-07-13 DIAGNOSIS — I10 Essential (primary) hypertension: Secondary | ICD-10-CM | POA: Diagnosis not present

## 2016-07-13 DIAGNOSIS — S72002D Fracture of unspecified part of neck of left femur, subsequent encounter for closed fracture with routine healing: Secondary | ICD-10-CM | POA: Diagnosis not present

## 2016-07-13 DIAGNOSIS — M1990 Unspecified osteoarthritis, unspecified site: Secondary | ICD-10-CM | POA: Diagnosis not present

## 2016-07-13 DIAGNOSIS — E1142 Type 2 diabetes mellitus with diabetic polyneuropathy: Secondary | ICD-10-CM | POA: Diagnosis not present

## 2016-07-13 DIAGNOSIS — F0390 Unspecified dementia without behavioral disturbance: Secondary | ICD-10-CM | POA: Diagnosis not present

## 2016-07-13 DIAGNOSIS — J449 Chronic obstructive pulmonary disease, unspecified: Secondary | ICD-10-CM | POA: Diagnosis not present

## 2016-07-13 NOTE — Telephone Encounter (Signed)
Pt son came in and dropped off  FMLA paperwork for him to be filled out about his mother.. Please fax to [phone] when complete..  Placed in Dr. Lars Mage box

## 2016-07-15 ENCOUNTER — Encounter: Payer: Self-pay | Admitting: Emergency Medicine

## 2016-07-15 ENCOUNTER — Emergency Department: Payer: Medicare Other

## 2016-07-15 ENCOUNTER — Emergency Department
Admission: EM | Admit: 2016-07-15 | Discharge: 2016-07-15 | Disposition: A | Discharge status: Home / Self Care | Payer: Medicare Other | Attending: Student | Admitting: Student

## 2016-07-15 DIAGNOSIS — W19XXXA Unspecified fall, initial encounter: Secondary | ICD-10-CM | POA: Insufficient documentation

## 2016-07-15 DIAGNOSIS — E039 Hypothyroidism, unspecified: Secondary | ICD-10-CM | POA: Insufficient documentation

## 2016-07-15 DIAGNOSIS — Z7982 Long term (current) use of aspirin: Secondary | ICD-10-CM | POA: Diagnosis not present

## 2016-07-15 DIAGNOSIS — M1990 Unspecified osteoarthritis, unspecified site: Secondary | ICD-10-CM | POA: Insufficient documentation

## 2016-07-15 DIAGNOSIS — Y929 Unspecified place or not applicable: Secondary | ICD-10-CM | POA: Insufficient documentation

## 2016-07-15 DIAGNOSIS — Z79899 Other long term (current) drug therapy: Secondary | ICD-10-CM | POA: Insufficient documentation

## 2016-07-15 DIAGNOSIS — Y939 Activity, unspecified: Secondary | ICD-10-CM | POA: Insufficient documentation

## 2016-07-15 DIAGNOSIS — E785 Hyperlipidemia, unspecified: Secondary | ICD-10-CM | POA: Diagnosis not present

## 2016-07-15 DIAGNOSIS — Y999 Unspecified external cause status: Secondary | ICD-10-CM | POA: Diagnosis not present

## 2016-07-15 DIAGNOSIS — Z8673 Personal history of transient ischemic attack (TIA), and cerebral infarction without residual deficits: Secondary | ICD-10-CM | POA: Diagnosis not present

## 2016-07-15 DIAGNOSIS — N390 Urinary tract infection, site not specified: Secondary | ICD-10-CM | POA: Insufficient documentation

## 2016-07-15 DIAGNOSIS — I10 Essential (primary) hypertension: Secondary | ICD-10-CM | POA: Insufficient documentation

## 2016-07-15 DIAGNOSIS — E119 Type 2 diabetes mellitus without complications: Secondary | ICD-10-CM | POA: Diagnosis not present

## 2016-07-15 DIAGNOSIS — Z8719 Personal history of other diseases of the digestive system: Secondary | ICD-10-CM | POA: Diagnosis not present

## 2016-07-15 DIAGNOSIS — Z86010 Personal history of colonic polyps: Secondary | ICD-10-CM | POA: Diagnosis not present

## 2016-07-15 DIAGNOSIS — Z7984 Long term (current) use of oral hypoglycemic drugs: Secondary | ICD-10-CM | POA: Insufficient documentation

## 2016-07-15 DIAGNOSIS — J449 Chronic obstructive pulmonary disease, unspecified: Secondary | ICD-10-CM | POA: Insufficient documentation

## 2016-07-15 DIAGNOSIS — Z85828 Personal history of other malignant neoplasm of skin: Secondary | ICD-10-CM | POA: Insufficient documentation

## 2016-07-15 DIAGNOSIS — R8290 Unspecified abnormal findings in urine: Secondary | ICD-10-CM | POA: Diagnosis present

## 2016-07-15 LAB — URINALYSIS COMPLETE WITH MICROSCOPIC (ARMC ONLY)
Bilirubin Urine: NEGATIVE
Glucose, UA: NEGATIVE mg/dL
Ketones, ur: NEGATIVE mg/dL
Nitrite: POSITIVE — AB
Protein, ur: NEGATIVE mg/dL
Specific Gravity, Urine: 1.012 (ref 1.005–1.030)
pH: 5 (ref 5.0–8.0)

## 2016-07-15 MED ORDER — CEPHALEXIN 500 MG PO CAPS
500.0000 mg | ORAL_CAPSULE | Freq: Once | ORAL | Status: AC
  Administered 2016-07-15: 500 mg via ORAL
  Filled 2016-07-15: qty 1

## 2016-07-15 MED ORDER — CEPHALEXIN 500 MG PO CAPS: 500.0000 mg | ORAL_CAPSULE | Freq: Two times a day (BID) | ORAL | Status: DC

## 2016-07-15 NOTE — ED Notes (Signed)
Pt to ED with caregiver c/o confusion and falls for a couple weeks.  Caregiver states odor to urine today.  States pt has dementia.

## 2016-07-15 NOTE — ED Provider Notes (Signed)
[facility] Emergency Department Provider Note   ____________________________________________  Time seen: Approximately 3:05 PM  I have reviewed the triage vital signs and the nursing notes.   HISTORY  Chief Complaint Urinary Tract Infection  Caveat-history of present illness review of systems Limited due to the patient's dementia. Information is obtained partly from the patient as well as her son, [name], over the phone and her caregiver who is at bedside.  HPI Sandra Kaufman is a 82 y.o. female history of dementia, hypertension, hyponatremia, hypothyroidism, COPD and GERD who presents for evaluation of increased confusion and falls over the past several weeks as well as malodorous urine today, gradual onset, constant, no modifying factor, moderate. It is unclear whether not she has hit her head when she has followed up by report she has fallen several times over the past week. No fevers or chills. No vomiting or diarrhea. Patient denies any chest pain or abdominal pain, no headache, she denies any numbness or weakness. In fact, the patient has no complaints at this time and is requesting discharge.   Past Medical History  Diagnosis Date  . Hypertension   . Hypercholesterolemia   . Hypothyroidism   . COPD (chronic obstructive pulmonary disease) ([place])   . GERD (gastroesophageal reflux disease)     with esophageal stricture requiring dilatation x 2 (12/96 and 10/99)  . Arthritis   . Glaucoma   . Peripheral neuropathy ([place])   . History of chicken pox   . Diverticulosis   . Migraines   . Colon polyps   . Urinary incontinence   . Cancer [facility])     skin    Patient Active Problem List   Diagnosis Date Noted  . Dementia with psychosis 04/22/2016  . Fracture of femoral neck, left ([place]) 04/16/2016  . Fracture, femur, neck, left, closed, initial encounter 04/16/2016  . Right lower quadrant pain 03/29/2016  . Vomiting 03/17/2016  . Rash 03/17/2016  .  Diarrhea 02/18/2016  . Adult hypothyroidism 01/29/2016  . Fall 01/19/2016  . Anemia 10/20/2015  . Headache, migraine 10/10/2015  . HLD (hyperlipidemia) 10/10/2015  . Glaucoma 10/10/2015  . Chronic obstructive pulmonary disease ([place]) 10/10/2015  . Arthritis 10/10/2015  . Hypertrophic toenail 07/23/2015  . Health care maintenance 05/27/2015  . Difficulty in walking 10/04/2014  . Cerebral ataxia ([place]) 10/04/2014  . Unsteady gait 08/22/2014  . Obesity 08/22/2014  . Dysphagia 07/14/2014  . Amnesia 05/24/2014  . B12 deficiency 05/24/2014  . 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 SOB (shortness of breath) 05/06/2014  . CVA (cerebral vascular accident) ([place]) 03/04/2014  . Diverticulosis 04/29/2013  . GERD (gastroesophageal reflux disease) 11/27/2012  . Urinary incontinence 11/27/2012  . Ovarian cyst 11/27/2012  . Depression 11/27/2012  . Neuropathy ([place]) 11/27/2012  . Hypothyroidism 11/15/2012  . Hypercholesterolemia 11/15/2012  . Diabetes mellitus ([place]) 11/15/2012  . Hypertension 11/15/2012    Past Surgical History  Procedure Laterality Date  . Abdominal hysterectomy  1961    ovaries not removed  . Tonsillectomy    . Appendectomy    . Bladder tack  1976  . Cataract extraction  1997    bilateral  . Cholecystectomy    . Interstim implant placement  2011  . Hip arthroplasty Left 04/16/2016    Procedure: ARTHROPLASTY BIPOLAR HIP (HEMIARTHROPLASTY);  Surgeon: Corky Mull, MD;  Location: [facility] ORS;  Service: Orthopedics;  Laterality: Left;    Current Outpatient Rx  Name  Route  Sig  Dispense  Refill  .  albuterol (PROVENTIL HFA;VENTOLIN HFA) 108 (90 BASE) MCG/ACT inhaler   Inhalation   Inhale 2 puffs into the lungs every 6 (six) hours as needed for wheezing or shortness of breath.   1 Inhaler   2   . amLODipine (NORVASC) 5 MG tablet   Oral   Take 1 tablet (5 mg total) by mouth daily.   30 tablet   2   . aspirin 325 MG tablet   Oral   Take 325 mg by mouth daily.           Marland Kitchen atorvastatin (LIPITOR) 40 MG tablet   Oral   Take 40 mg by mouth at bedtime.         . betamethasone valerate ointment (VALISONE) 0.1 %   Topical   Apply 1 application topically 2 (two) times daily. Do not use for more than 2 weeks consecutively.   30 g   0   . bimatoprost (LUMIGAN) 0.01 % SOLN   Both Eyes   Place 1 drop into both eyes at bedtime.         . brinzolamide (AZOPT) 1 % ophthalmic suspension   Both Eyes   Place 1 drop into both eyes 2 (two) times daily.          . cephALEXin (KEFLEX) 500 MG capsule   Oral   Take 1 capsule (500 mg total) by mouth 2 (two) times daily.   14 capsule   0   . Coenzyme Q10 (CO Q10) 100 MG CAPS   Oral   Take 100 mg by mouth daily.          Marland Kitchen docusate sodium (COLACE) 100 MG capsule   Oral   Take 1 capsule (100 mg total) by mouth 2 (two) times daily.   10 capsule   0   . enoxaparin (LOVENOX) 40 MG/0.4ML injection   Subcutaneous   Inject 0.4 mLs (40 mg total) into the skin daily. X 8 more days   4 mL   0   . feeding supplement, ENSURE ENLIVE, (ENSURE ENLIVE) LIQD   Oral   Take 237 mLs by mouth 2 (two) times daily between meals.   237 mL   12   . fluticasone (FLONASE) 50 MCG/ACT nasal spray   Each Nare   Place 2 sprays into both nostrils daily.         Marland Kitchen levothyroxine (SYNTHROID, LEVOTHROID) 100 MCG tablet   Oral   Take 100 mcg by mouth daily before breakfast.         . levothyroxine (SYNTHROID, LEVOTHROID) 100 MCG tablet      TAKE 1 TABLET (100 MCG TOTAL) BY MOUTH DAILY BEFORE BREAKFAST.   90 tablet   1   . levothyroxine (SYNTHROID, LEVOTHROID) 100 MCG tablet      TAKE 1 TABLET (100 MCG TOTAL) BY MOUTH DAILY BEFORE BREAKFAST.   90 tablet   2   . memantine (NAMENDA) 10 MG tablet   Oral   Take 10 mg by mouth 2 (two) times daily.         . metFORMIN (GLUCOPHAGE) 500 MG tablet   Oral   Take 1 tablet (500 mg total) by mouth daily.   30 tablet   2   . metoprolol tartrate (LOPRESSOR) 25 MG  tablet   Oral   Take 1 tablet (25 mg total) by mouth 2 (two) times daily.   60 tablet   2   . oxyCODONE (OXY IR/ROXICODONE) 5 MG immediate release tablet  Oral   Take 1 tablet (5 mg total) by mouth every 6 (six) hours as needed for breakthrough pain.   30 tablet   0   . pantoprazole (PROTONIX) 40 MG tablet   Oral   Take 1 tablet (40 mg total) by mouth daily.   30 tablet   11   . psyllium (METAMUCIL) 0.52 G capsule   Oral   Take 2.08 g by mouth 2 (two) times daily.          . risperiDONE (RISPERDAL M-TABS) 1 MG disintegrating tablet   Oral   Take 1 tablet (1 mg total) by mouth at bedtime.   30 tablet   0   . sertraline (ZOLOFT) 100 MG tablet   Oral   Take 100 mg by mouth daily.         Marland Kitchen triamcinolone cream (KENALOG) 0.1 %   Topical   Apply 1 application topically 2 (two) times daily as needed (for itching).         . vitamin B-12 (CYANOCOBALAMIN) 1000 MCG tablet   Oral   Take 2,000 mcg by mouth daily.           Allergies Tramadol  Family History  Problem Relation Age of Onset  . Heart disease Father     myocardial infarction  . Arthritis/Rheumatoid Mother   . Diabetes Sister   . Epilepsy Sister   . Breast cancer Neg Hx   . Colon cancer Neg Hx     Social History Social History  Substance Use Topics  . Smoking status: Never Smoker   . Smokeless tobacco: Never Used  . Alcohol Use: No    Review of Systems Constitutional: No fever/chills Eyes: No visual changes. ENT: No sore throat. Cardiovascular: Denies chest pain. Respiratory: Denies shortness of breath. Gastrointestinal: No abdominal pain.  No nausea, no vomiting.  No diarrhea.  No constipation. Genitourinary: Negative for dysuria. Musculoskeletal: Negative for back pain. Skin: Negative for rash. Neurological: Negative for headaches, focal weakness or numbness.  Caveat-history of present illness review of systems Limited due to the patient's dementia. Information is obtained partly  from the patient as well as her son, [name], over the phone and her caregiver who is at bedside. ____________________________________________   PHYSICAL EXAM:  Filed Vitals:   07/15/16 1241 07/15/16 1545  BP: 159/85 157/81  Pulse: 84 74  Temp: 98.9 F (37.2 C)   TempSrc: Oral   Resp: 18 18  Height: 5\' 5"  (1.651 m)   Weight: 160 lb (72.576 kg)   SpO2: 95% 98%    VITAL SIGNS: ED Triage Vitals  Enc Vitals Group     BP 07/15/16 1241 159/85 mmHg     Pulse Rate 07/15/16 1241 84     Resp 07/15/16 1241 18     Temp 07/15/16 1241 98.9 F (37.2 C)     Temp Source 07/15/16 1241 Oral     SpO2 07/15/16 1241 95 %     Weight 07/15/16 1241 160 lb (72.576 kg)     Height 07/15/16 1241 5\' 5"  (1.651 m)     Head Cir --      Peak Flow --      Pain Score --      Pain Loc --      Pain Edu? --      Excl. in [place]? --     Constitutional: Alert and oriented To self and place but not to year. Well appearing and in no acute distress. Eyes: Conjunctivae  are normal. PERRL. EOMI. Head: Atraumatic. Nose: No congestion/rhinnorhea. Mouth/Throat: Mucous membranes are moist.  Oropharynx non-erythematous. Neck: No stridor. No cervical spine tenderness to palpation. Cardiovascular: Normal rate, regular rhythm. Grossly normal heart sounds.  Good peripheral circulation. Respiratory: Normal respiratory effort.  No retractions. Lungs CTAB. Gastrointestinal: Soft and nontender. No distention.  No CVA tenderness. Genitourinary: deferred Musculoskeletal: No lower extremity tenderness nor edema.  No joint effusions. Healing large ecchymosis in the triceps region in the right arm without bony tenderness or deformity, full range of motion throughout the right arm. Ecchymosis and tenderness in the left breast/left lateral chest wall. Ecchymosis in the posterior aspect of the left shoulder without any limitations in mobility. Neurologic:  Normal speech and language. No gross focal neurologic deficits are appreciated. 5  out of 5 strength in bilateral upper and lower extremities, sensation intact to light touch throughout. Skin:  Skin is warm, dry and intact. No rash noted. Psychiatric: Mood and affect are normal. Speech and behavior are normal.  ____________________________________________   LABS (all labs ordered are listed, but only abnormal results are displayed)  Labs Reviewed  URINALYSIS COMPLETEWITH MICROSCOPIC ([place] ONLY) - Abnormal; Notable for the following:    Color, Urine YELLOW (*)    APPearance HAZY (*)    Hgb urine dipstick 1+ (*)    Nitrite POSITIVE (*)    Leukocytes, UA TRACE (*)    Bacteria, UA MANY (*)    Squamous Epithelial / LPF 0-5 (*)    All other components within normal limits  URINE CULTURE   ____________________________________________  EKG  ED ECG REPORT I, Joanne Gavel, the attending physician, personally viewed and interpreted this ECG.   Date: 07/15/2016  EKG Time: 15:26  Rate: 76  Rhythm: normal sinus rhythm  Axis: normal  Intervals:none  ST&T Change: No acute ST elevation or acute ST depression. Q waves in V2 and V3. EKG unchanged from 04/15/2016.  ____________________________________________  RADIOLOGY  none ____________________________________________   PROCEDURES  Procedure(s) performed: None  Procedures  Critical Care performed: No  ____________________________________________   INITIAL IMPRESSION / ASSESSMENT AND PLAN / ED COURSE  Pertinent labs & imaging results that were available during my care of the patient were reviewed by me and considered in my medical decision making (see chart for details).  Sandra Kaufman is a 82 y.o. female history of dementia, hypertension, hyponatremia, hypothyroidism, COPD and GERD who presents for evaluation of increased confusion and falls over the past several weeks as well as malodorous urine today. On exam, she is well-appearing and in no acute distress, her vital signs are stable, she is  afebrile. She appears to have an intact neurological exam, benign cardiopulmonary exam. She has several different areas of ecchymosis in the upper torso some associated tenderness but no bony deformity. Her urinalysis is concerning for urinary tract infection, nitrite positive, many bacteria. This certainly could be the cause of her symptoms however given her age, increase in falls and confusion, I recommended screening labs, EKG, CT scan of her head to rule out traumatic injury or stroke as well as chest x-ray to evaluate for fracture given her ecchymosis and tenderness in the left chest wall. She has adamantly refused any additional testing. I discussed this with her son [name], he is her healthcare power of attorney. We discussed risks and benefits of the above tests, we discussed that my intent would be to rule out a life-threatening process such as a critical electrolyte abnormality, symptomatic anemia, intracranial hemorrhage, multiple rib  fractures which could result in untimely death of his mother. He reports that his only concern was for a urinary tract infection and he does not want to upset the patient any longer by having her stay in the emergency department and undergo testing, so he agrees with her refusal of any additional testing and does not want [place] to proceed with any additional testing. He voices understanding of the risks. We'll DC with Keflex, return precautions and close PCP follow-up. The patient, her caregiver and her son are all comfortable with the discharge plan. ____________________________________________   FINAL CLINICAL IMPRESSION(S) / ED DIAGNOSES  Final diagnoses:  UTI (lower urinary tract infection)  Falls, initial encounter      NEW MEDICATIONS STARTED DURING THIS VISIT:  New Prescriptions   CEPHALEXIN (KEFLEX) 500 MG CAPSULE    Take 1 capsule (500 mg total) by mouth 2 (two) times daily.     Note:  This document was prepared using Dragon voice recognition  software and may include unintentional dictation errors.    Joanne Gavel, MD 07/15/16 [phone]

## 2016-07-17 LAB — URINE CULTURE: Culture: >=100000 — AB

## 2016-07-17 NOTE — Telephone Encounter (Signed)
I assume the sone dropping off the form is [employer].  Can confirm and confirm that he needs for missing work to take her to appts and help care for her.  If so, There is a copy of FMLA form in chart - 05/21/14 date.  Please complete or have someone complete.  Will need to change dates on form for recent hospitalization.  Thanks

## 2016-07-19 ENCOUNTER — Encounter: Payer: Self-pay | Admitting: Internal Medicine

## 2016-07-20 ENCOUNTER — Telehealth: Payer: Self-pay | Admitting: *Deleted

## 2016-07-20 NOTE — Telephone Encounter (Signed)
I reviewed ER note and his previous message.  Per previous message, he was wanting a medication to help with sundowning.  See other message.  I had recommended psych referral to help with medication given her history.  In reviewing her ER note, it stated that she desired no furtter w/up and that he did not desire any further w/up.  See ER note.  Given recent falls and increased confusion, will need further w/up if agreeable.  If any acute symptoms, evaluation today.  I can work in Wednesday 1:30 for further evaluation.

## 2016-07-20 NOTE — Telephone Encounter (Signed)
Patient son stated that she was seen in the emergency room last week for a UTi, he stated that medication is not working because his mother continues to have altered mental status. He requested a call to discuss the next treatment for care.  Juanda Crumble [phone]

## 2016-07-20 NOTE — Telephone Encounter (Signed)
Attempted to reach [place], left a VM to return my call, thanks

## 2016-07-20 NOTE — Telephone Encounter (Signed)
Spoke with Sandra Kaufman Patient has been taking prescription for UTI symptoms, including altered Mental Status.  She has in the past had what he was calling Sundown syndrome, but he has noticed it is continuing throughout the day.  She still has a few days of antibiotics left to take, no new urinary symptoms, no fevers, just increased confusion.  Son is concerned, please advise, thanks     [phone] Cell, phone

## 2016-07-21 DIAGNOSIS — I10 Essential (primary) hypertension: Secondary | ICD-10-CM | POA: Diagnosis not present

## 2016-07-21 DIAGNOSIS — F0390 Unspecified dementia without behavioral disturbance: Secondary | ICD-10-CM | POA: Diagnosis not present

## 2016-07-21 DIAGNOSIS — S72002D Fracture of unspecified part of neck of left femur, subsequent encounter for closed fracture with routine healing: Secondary | ICD-10-CM | POA: Diagnosis not present

## 2016-07-21 DIAGNOSIS — J449 Chronic obstructive pulmonary disease, unspecified: Secondary | ICD-10-CM | POA: Diagnosis not present

## 2016-07-21 DIAGNOSIS — E1142 Type 2 diabetes mellitus with diabetic polyneuropathy: Secondary | ICD-10-CM | POA: Diagnosis not present

## 2016-07-21 DIAGNOSIS — M1990 Unspecified osteoarthritis, unspecified site: Secondary | ICD-10-CM | POA: Diagnosis not present

## 2016-07-21 NOTE — Telephone Encounter (Signed)
Form completed and placed in your box.  

## 2016-07-21 NOTE — Telephone Encounter (Signed)
Spoke with son, he is willing to bring her in tomorrow at 28, thanks

## 2016-07-21 NOTE — Telephone Encounter (Signed)
Faxed

## 2016-07-21 NOTE — Telephone Encounter (Signed)
Paperwork completed, given to CMA to have Dr. Nicki Reaper sign and fax. thanks

## 2016-07-22 ENCOUNTER — Encounter: Payer: Self-pay | Admitting: Internal Medicine

## 2016-07-22 ENCOUNTER — Ambulatory Visit (INDEPENDENT_AMBULATORY_CARE_PROVIDER_SITE_OTHER): Payer: Medicare Other | Admitting: Internal Medicine

## 2016-07-22 ENCOUNTER — Ambulatory Visit: Payer: Medicare Other | Admitting: Internal Medicine

## 2016-07-22 ENCOUNTER — Encounter: Payer: Self-pay | Admitting: Emergency Medicine

## 2016-07-22 ENCOUNTER — Emergency Department
Admission: EM | Admit: 2016-07-22 | Discharge: 2016-07-22 | Disposition: A | Discharge status: Home / Self Care | Payer: Medicare Other | Source: Home / Self Care | Attending: Emergency Medicine | Admitting: Emergency Medicine

## 2016-07-22 ENCOUNTER — Inpatient Hospital Stay
Admission: AD | Admit: 2016-07-22 | Discharge: 2016-07-25 | DRG: 689 | Disposition: A | Discharge status: Skilled Nursing Facility | Payer: Medicare Other | Source: Ambulatory Visit | Attending: Internal Medicine | Admitting: Internal Medicine

## 2016-07-22 ENCOUNTER — Emergency Department: Payer: Medicare Other

## 2016-07-22 DIAGNOSIS — S2239XD Fracture of one rib, unspecified side, subsequent encounter for fracture with routine healing: Secondary | ICD-10-CM | POA: Diagnosis not present

## 2016-07-22 DIAGNOSIS — Z79899 Other long term (current) drug therapy: Secondary | ICD-10-CM

## 2016-07-22 DIAGNOSIS — I4581 Long QT syndrome: Secondary | ICD-10-CM | POA: Diagnosis not present

## 2016-07-22 DIAGNOSIS — Z1624 Resistance to multiple antibiotics: Secondary | ICD-10-CM | POA: Diagnosis present

## 2016-07-22 DIAGNOSIS — G629 Polyneuropathy, unspecified: Secondary | ICD-10-CM | POA: Diagnosis not present

## 2016-07-22 DIAGNOSIS — Z1612 Extended spectrum beta lactamase (ESBL) resistance: Secondary | ICD-10-CM | POA: Diagnosis present

## 2016-07-22 DIAGNOSIS — Z833 Family history of diabetes mellitus: Secondary | ICD-10-CM | POA: Diagnosis not present

## 2016-07-22 DIAGNOSIS — K219 Gastro-esophageal reflux disease without esophagitis: Secondary | ICD-10-CM | POA: Diagnosis present

## 2016-07-22 DIAGNOSIS — R296 Repeated falls: Secondary | ICD-10-CM | POA: Diagnosis present

## 2016-07-22 DIAGNOSIS — B9620 Unspecified Escherichia coli [E. coli] as the cause of diseases classified elsewhere: Secondary | ICD-10-CM | POA: Diagnosis not present

## 2016-07-22 DIAGNOSIS — Z96642 Presence of left artificial hip joint: Secondary | ICD-10-CM | POA: Diagnosis present

## 2016-07-22 DIAGNOSIS — J449 Chronic obstructive pulmonary disease, unspecified: Secondary | ICD-10-CM | POA: Insufficient documentation

## 2016-07-22 DIAGNOSIS — S2232XA Fracture of one rib, left side, initial encounter for closed fracture: Secondary | ICD-10-CM | POA: Insufficient documentation

## 2016-07-22 DIAGNOSIS — G9340 Encephalopathy, unspecified: Secondary | ICD-10-CM | POA: Diagnosis present

## 2016-07-22 DIAGNOSIS — R410 Disorientation, unspecified: Secondary | ICD-10-CM

## 2016-07-22 DIAGNOSIS — M1990 Unspecified osteoarthritis, unspecified site: Secondary | ICD-10-CM | POA: Insufficient documentation

## 2016-07-22 DIAGNOSIS — I10 Essential (primary) hypertension: Secondary | ICD-10-CM | POA: Diagnosis present

## 2016-07-22 DIAGNOSIS — Z85828 Personal history of other malignant neoplasm of skin: Secondary | ICD-10-CM

## 2016-07-22 DIAGNOSIS — I639 Cerebral infarction, unspecified: Secondary | ICD-10-CM | POA: Diagnosis not present

## 2016-07-22 DIAGNOSIS — Z7982 Long term (current) use of aspirin: Secondary | ICD-10-CM

## 2016-07-22 DIAGNOSIS — F329 Major depressive disorder, single episode, unspecified: Secondary | ICD-10-CM | POA: Insufficient documentation

## 2016-07-22 DIAGNOSIS — Z820 Family history of epilepsy and other diseases of the nervous system: Secondary | ICD-10-CM

## 2016-07-22 DIAGNOSIS — N390 Urinary tract infection, site not specified: Secondary | ICD-10-CM | POA: Diagnosis present

## 2016-07-22 DIAGNOSIS — E1140 Type 2 diabetes mellitus with diabetic neuropathy, unspecified: Secondary | ICD-10-CM | POA: Diagnosis not present

## 2016-07-22 DIAGNOSIS — E119 Type 2 diabetes mellitus without complications: Secondary | ICD-10-CM | POA: Insufficient documentation

## 2016-07-22 DIAGNOSIS — Y9389 Activity, other specified: Secondary | ICD-10-CM | POA: Insufficient documentation

## 2016-07-22 DIAGNOSIS — R2681 Unsteadiness on feet: Secondary | ICD-10-CM

## 2016-07-22 DIAGNOSIS — F0391 Unspecified dementia with behavioral disturbance: Secondary | ICD-10-CM | POA: Diagnosis not present

## 2016-07-22 DIAGNOSIS — H409 Unspecified glaucoma: Secondary | ICD-10-CM | POA: Diagnosis present

## 2016-07-22 DIAGNOSIS — Z95828 Presence of other vascular implants and grafts: Secondary | ICD-10-CM

## 2016-07-22 DIAGNOSIS — Z86010 Personal history of colonic polyps: Secondary | ICD-10-CM | POA: Diagnosis not present

## 2016-07-22 DIAGNOSIS — M25552 Pain in left hip: Secondary | ICD-10-CM | POA: Diagnosis not present

## 2016-07-22 DIAGNOSIS — S79912A Unspecified injury of left hip, initial encounter: Secondary | ICD-10-CM | POA: Diagnosis not present

## 2016-07-22 DIAGNOSIS — Y92009 Unspecified place in unspecified non-institutional (private) residence as the place of occurrence of the external cause: Secondary | ICD-10-CM

## 2016-07-22 DIAGNOSIS — Z9841 Cataract extraction status, right eye: Secondary | ICD-10-CM

## 2016-07-22 DIAGNOSIS — Z9842 Cataract extraction status, left eye: Secondary | ICD-10-CM

## 2016-07-22 DIAGNOSIS — Z9049 Acquired absence of other specified parts of digestive tract: Secondary | ICD-10-CM | POA: Diagnosis not present

## 2016-07-22 DIAGNOSIS — S0003XA Contusion of scalp, initial encounter: Secondary | ICD-10-CM | POA: Diagnosis not present

## 2016-07-22 DIAGNOSIS — F0390 Unspecified dementia without behavioral disturbance: Secondary | ICD-10-CM | POA: Diagnosis not present

## 2016-07-22 DIAGNOSIS — M6281 Muscle weakness (generalized): Secondary | ICD-10-CM | POA: Diagnosis not present

## 2016-07-22 DIAGNOSIS — Z7951 Long term (current) use of inhaled steroids: Secondary | ICD-10-CM

## 2016-07-22 DIAGNOSIS — Z90710 Acquired absence of both cervix and uterus: Secondary | ICD-10-CM

## 2016-07-22 DIAGNOSIS — E7800 Pure hypercholesterolemia, unspecified: Secondary | ICD-10-CM | POA: Diagnosis not present

## 2016-07-22 DIAGNOSIS — Y999 Unspecified external cause status: Secondary | ICD-10-CM | POA: Insufficient documentation

## 2016-07-22 DIAGNOSIS — E785 Hyperlipidemia, unspecified: Secondary | ICD-10-CM

## 2016-07-22 DIAGNOSIS — R531 Weakness: Secondary | ICD-10-CM | POA: Diagnosis not present

## 2016-07-22 DIAGNOSIS — Z452 Encounter for adjustment and management of vascular access device: Secondary | ICD-10-CM | POA: Diagnosis not present

## 2016-07-22 DIAGNOSIS — R1012 Left upper quadrant pain: Secondary | ICD-10-CM | POA: Diagnosis not present

## 2016-07-22 DIAGNOSIS — Z8249 Family history of ischemic heart disease and other diseases of the circulatory system: Secondary | ICD-10-CM

## 2016-07-22 DIAGNOSIS — W19XXXA Unspecified fall, initial encounter: Secondary | ICD-10-CM

## 2016-07-22 DIAGNOSIS — Z7984 Long term (current) use of oral hypoglycemic drugs: Secondary | ICD-10-CM | POA: Diagnosis not present

## 2016-07-22 DIAGNOSIS — Z888 Allergy status to other drugs, medicaments and biological substances status: Secondary | ICD-10-CM | POA: Diagnosis not present

## 2016-07-22 DIAGNOSIS — F418 Other specified anxiety disorders: Secondary | ICD-10-CM | POA: Diagnosis not present

## 2016-07-22 DIAGNOSIS — R262 Difficulty in walking, not elsewhere classified: Secondary | ICD-10-CM | POA: Diagnosis not present

## 2016-07-22 DIAGNOSIS — Z8673 Personal history of transient ischemic attack (TIA), and cerebral infarction without residual deficits: Secondary | ICD-10-CM

## 2016-07-22 DIAGNOSIS — R4789 Other speech disturbances: Secondary | ICD-10-CM | POA: Diagnosis not present

## 2016-07-22 DIAGNOSIS — E039 Hypothyroidism, unspecified: Secondary | ICD-10-CM | POA: Insufficient documentation

## 2016-07-22 DIAGNOSIS — Z9181 History of falling: Secondary | ICD-10-CM | POA: Diagnosis not present

## 2016-07-22 DIAGNOSIS — Z7401 Bed confinement status: Secondary | ICD-10-CM | POA: Diagnosis not present

## 2016-07-22 DIAGNOSIS — R4182 Altered mental status, unspecified: Secondary | ICD-10-CM

## 2016-07-22 DIAGNOSIS — S72002D Fracture of unspecified part of neck of left femur, subsequent encounter for closed fracture with routine healing: Secondary | ICD-10-CM

## 2016-07-22 DIAGNOSIS — Z66 Do not resuscitate: Secondary | ICD-10-CM | POA: Diagnosis present

## 2016-07-22 DIAGNOSIS — E1139 Type 2 diabetes mellitus with other diabetic ophthalmic complication: Secondary | ICD-10-CM | POA: Diagnosis not present

## 2016-07-22 LAB — CREATININE, SERUM
Creatinine, Ser: 0.79 mg/dL (ref 0.44–1.00)
GFR calc Af Amer: >60 mL/min (ref >60)
GFR calc non Af Amer: >60 mL/min (ref >60)

## 2016-07-22 LAB — CBC
HCT: 35.2 % (ref 35.0–47.0)
Hemoglobin: 11.7 g/dL — ABNORMAL LOW (ref 12.0–16.0)
MCH: 28.9 pg (ref 26.0–34.0)
MCHC: 33.2 g/dL (ref 32.0–36.0)
MCV: 87.1 fL (ref 80.0–100.0)
Platelets: 203 10*3/uL (ref 150–440)
RBC: 4.04 MIL/uL (ref 3.80–5.20)
RDW: 15.3 % — ABNORMAL HIGH (ref 11.5–14.5)
WBC: 7.7 10*3/uL (ref 3.6–11.0)

## 2016-07-22 MED ORDER — ONDANSETRON HCL 4 MG/2ML IJ SOLN: 4.0000 mg | Freq: Four times a day (QID) | INTRAMUSCULAR | Status: DC | PRN

## 2016-07-22 MED ORDER — ATORVASTATIN CALCIUM 20 MG PO TABS
40.0000 mg | ORAL_TABLET | Freq: Every day | ORAL | Status: DC
  Administered 2016-07-22: 40 mg via ORAL
  Filled 2016-07-22: qty 2

## 2016-07-22 MED ORDER — ACETAMINOPHEN 650 MG RE SUPP: 650.0000 mg | Freq: Four times a day (QID) | RECTAL | Status: DC | PRN

## 2016-07-22 MED ORDER — FLUTICASONE PROPIONATE 50 MCG/ACT NA SUSP
2.0000 | Freq: Every day | NASAL | Status: DC
Start: 2016-07-22 — End: 2016-07-25
  Administered 2016-07-25: 2 via NASAL
  Filled 2016-07-22 (×2): qty 16

## 2016-07-22 MED ORDER — ENSURE ENLIVE PO LIQD
237.0000 mL | Freq: Two times a day (BID) | ORAL | Status: DC
Start: 2016-07-22 — End: 2016-07-25
  Administered 2016-07-23 (×2): 237 mL via ORAL

## 2016-07-22 MED ORDER — SODIUM CHLORIDE 0.9 % IV SOLN
INTRAVENOUS | Status: DC
  Administered 2016-07-22 – 2016-07-23 (×2): via INTRAVENOUS

## 2016-07-22 MED ORDER — SERTRALINE HCL 100 MG PO TABS
100.0000 mg | ORAL_TABLET | Freq: Every day | ORAL | Status: DC
  Administered 2016-07-22 – 2016-07-25 (×4): 100 mg via ORAL
  Filled 2016-07-22 (×4): qty 1

## 2016-07-22 MED ORDER — PSYLLIUM 95 % PO PACK
0.5000 | PACK | Freq: Two times a day (BID) | ORAL | Status: DC
  Administered 2016-07-22 – 2016-07-25 (×6): 0.5 via ORAL
  Filled 2016-07-22 (×6): qty 1

## 2016-07-22 MED ORDER — DOCUSATE SODIUM 100 MG PO CAPS
100.0000 mg | ORAL_CAPSULE | Freq: Two times a day (BID) | ORAL | Status: DC
  Administered 2016-07-22 – 2016-07-25 (×6): 100 mg via ORAL
  Filled 2016-07-22 (×6): qty 1

## 2016-07-22 MED ORDER — LEVOTHYROXINE SODIUM 50 MCG PO TABS
100.0000 ug | ORAL_TABLET | Freq: Every day | ORAL | Status: DC
  Administered 2016-07-23 – 2016-07-25 (×3): 100 ug via ORAL
  Filled 2016-07-22 (×3): qty 2

## 2016-07-22 MED ORDER — SODIUM CHLORIDE 0.9 % IV SOLN
500.0000 mg | Freq: Once | INTRAVENOUS | Status: AC
  Administered 2016-07-22: 500 mg via INTRAVENOUS
  Filled 2016-07-22: qty 500

## 2016-07-22 MED ORDER — ONDANSETRON HCL 4 MG PO TABS: 4.0000 mg | ORAL_TABLET | Freq: Four times a day (QID) | ORAL | Status: DC | PRN

## 2016-07-22 MED ORDER — CO Q10 100 MG PO CAPS: 100.0000 mg | ORAL_CAPSULE | Freq: Every day | ORAL | Status: DC

## 2016-07-22 MED ORDER — ASPIRIN 325 MG PO TABS
325.0000 mg | ORAL_TABLET | Freq: Every day | ORAL | Status: DC
  Administered 2016-07-22 – 2016-07-25 (×4): 325 mg via ORAL
  Filled 2016-07-22 (×4): qty 1

## 2016-07-22 MED ORDER — BISACODYL 5 MG PO TBEC: 5.0000 mg | DELAYED_RELEASE_TABLET | Freq: Every day | ORAL | Status: DC | PRN

## 2016-07-22 MED ORDER — HYDROCODONE-ACETAMINOPHEN 5-325 MG PO TABS: 1.0000 | ORAL_TABLET | ORAL | 0 refills | Status: DC | PRN

## 2016-07-22 MED ORDER — VITAMIN B-12 1000 MCG PO TABS
2000.0000 ug | ORAL_TABLET | Freq: Every day | ORAL | Status: DC
  Administered 2016-07-22 – 2016-07-25 (×4): 2000 ug via ORAL
  Filled 2016-07-22 (×4): qty 2

## 2016-07-22 MED ORDER — ALBUTEROL SULFATE (2.5 MG/3ML) 0.083% IN NEBU: 2.5000 mg | INHALATION_SOLUTION | Freq: Four times a day (QID) | RESPIRATORY_TRACT | Status: DC | PRN

## 2016-07-22 MED ORDER — DOCUSATE SODIUM 100 MG PO CAPS: 100.0000 mg | ORAL_CAPSULE | Freq: Two times a day (BID) | ORAL | Status: DC

## 2016-07-22 MED ORDER — MEMANTINE HCL 5 MG PO TABS
10.0000 mg | ORAL_TABLET | Freq: Two times a day (BID) | ORAL | Status: DC
  Administered 2016-07-22 – 2016-07-25 (×6): 10 mg via ORAL
  Filled 2016-07-22 (×6): qty 2

## 2016-07-22 MED ORDER — METOPROLOL TARTRATE 25 MG PO TABS
25.0000 mg | ORAL_TABLET | Freq: Two times a day (BID) | ORAL | Status: DC
  Administered 2016-07-22 – 2016-07-25 (×6): 25 mg via ORAL
  Filled 2016-07-22 (×6): qty 1

## 2016-07-22 MED ORDER — SODIUM CHLORIDE 0.9 % IV SOLN
500.0000 mg | Freq: Three times a day (TID) | INTRAVENOUS | Status: DC
  Administered 2016-07-23 – 2016-07-24 (×4): 500 mg via INTRAVENOUS
  Filled 2016-07-22 (×6): qty 500

## 2016-07-22 MED ORDER — HEPARIN SODIUM (PORCINE) 5000 UNIT/ML IJ SOLN
5000.0000 [IU] | Freq: Three times a day (TID) | INTRAMUSCULAR | Status: DC
  Administered 2016-07-23 – 2016-07-24 (×5): 5000 [IU] via SUBCUTANEOUS
  Filled 2016-07-22 (×7): qty 1

## 2016-07-22 MED ORDER — PSYLLIUM 0.52 G PO CAPS: 2.0800 g | ORAL_CAPSULE | Freq: Two times a day (BID) | ORAL | Status: DC

## 2016-07-22 MED ORDER — AMLODIPINE BESYLATE 5 MG PO TABS
5.0000 mg | ORAL_TABLET | Freq: Every day | ORAL | Status: DC
  Administered 2016-07-22 – 2016-07-25 (×4): 5 mg via ORAL
  Filled 2016-07-22 (×4): qty 1

## 2016-07-22 MED ORDER — BRINZOLAMIDE 1 % OP SUSP
1.0000 [drp] | Freq: Two times a day (BID) | OPHTHALMIC | Status: DC
  Administered 2016-07-22 – 2016-07-25 (×6): 1 [drp] via OPHTHALMIC
  Filled 2016-07-22: qty 10

## 2016-07-22 MED ORDER — ACETAMINOPHEN 325 MG PO TABS
650.0000 mg | ORAL_TABLET | Freq: Four times a day (QID) | ORAL | Status: DC | PRN
  Administered 2016-07-22: 650 mg via ORAL
  Filled 2016-07-22: qty 2

## 2016-07-22 MED ORDER — ALBUTEROL SULFATE HFA 108 (90 BASE) MCG/ACT IN AERS: 2.0000 | INHALATION_SPRAY | Freq: Four times a day (QID) | RESPIRATORY_TRACT | Status: DC | PRN

## 2016-07-22 MED ORDER — PANTOPRAZOLE SODIUM 40 MG PO TBEC
40.0000 mg | DELAYED_RELEASE_TABLET | Freq: Every day | ORAL | Status: DC
  Administered 2016-07-22 – 2016-07-25 (×4): 40 mg via ORAL
  Filled 2016-07-22 (×4): qty 1

## 2016-07-22 NOTE — Assessment & Plan Note (Signed)
Not checking sugars.  Last a1c 6.8.  Previously controlled.   Question control now.  Follow.

## 2016-07-22 NOTE — Assessment & Plan Note (Signed)
Frequent falls.  Has been occurring daily now.  Unclear etiology.  Probably multifactorial.  Has neuropathy.  Could contribute to some unsteadiness.  Recheck urine and treat if persistent infection.  May need to change abx.  With recent increased falls, confusion and unsteadiness, feel needs scan.  More intensive therapy.

## 2016-07-22 NOTE — Assessment & Plan Note (Signed)
Gait has worsened significantly recently.  Has been working with PT.  Unable to stand and walk now without assistance.  Feel admission warranted.  Pt and family agree.  Will need labs, scan and treat infection.  Hospitalist notified and agree to directly admit pt.

## 2016-07-22 NOTE — ED Triage Notes (Addendum)
Pt to ED via EMS for mechanical fall at home today. Pt has 24hr caregiver at home, per family pt states she's had hip surgery within 30 days , ED visit per family request. Per EMS family states pt has increased memory loss recent. Pt currently being treated for UTI. Pt A&O x4

## 2016-07-22 NOTE — Assessment & Plan Note (Signed)
Previous CVA.  Has had increased unsteadiness and confusion.  Concern about another stroke.  Feel head scan warranted.

## 2016-07-22 NOTE — Assessment & Plan Note (Signed)
Blood pressure has been under good control.  Continue same medication regimen.  Follow pressures.  

## 2016-07-22 NOTE — H&P (Addendum)
[facility] [place] NAME: Sandra Kaufman    MR#:  [id number]  DATE OF BIRTH:  [birth year]  DATE OF ADMISSION:  07/22/2016  PRIMARY CARE PHYSICIAN: Einar Pheasant, MD   REQUESTING/REFERRING PHYSICIAN: Einar Pheasant  CHIEF COMPLAINT:  No chief complaint on file.   HISTORY OF PRESENT ILLNESS:  Sandra Kaufman  is a 82 y.o. female with a known history of Recent hip fracture in April left hip hemiarthroplasty, Left hip hemiarthroplasty, having home physical therapy came to emergency room today morning because of. Fall. He left the left hip negative for acute fracture, x-ray of the ribs showed that the 10th rib fracture. Patient was discharged from the ER, patient went to see primary doctor, according to the primary doctor patient has been having recurrent falls, confusion so she was sent in as a direct admit to evaluate for any acute stroke/UTI. According to the son and patient has been having recurrent falls recently. No fever or dysuria. Also has confusion on and off. No slurred speech, no other problems. Seen on July 19 for UTI. Urine cultures positive for ESBL UTI, patient received Keflex  prescription that day. Past Medical History:  Diagnosis Date  . Arthritis   . Cancer ([place])    skin  . Colon polyps   . COPD (chronic obstructive pulmonary disease) ([place])   . Diverticulosis   . GERD (gastroesophageal reflux disease)    with esophageal stricture requiring dilatation x 2 (12/96 and 10/99)  . Glaucoma   . History of chicken pox   . Hypercholesterolemia   . Hypertension   . Hypothyroidism   . Migraines   . Peripheral neuropathy ([place])   . Urinary incontinence     PAST SURGICAL HISTOIRY:   Past Surgical History:  Procedure Laterality Date  . ABDOMINAL HYSTERECTOMY  1961   ovaries not removed  . APPENDECTOMY    . bladder tack  1976  . CATARACT EXTRACTION  1997   bilateral  . CHOLECYSTECTOMY    . HIP ARTHROPLASTY Left 04/16/2016    Procedure: ARTHROPLASTY BIPOLAR HIP (HEMIARTHROPLASTY);  Surgeon: Corky Mull, MD;  Location: [facility] ORS;  Service: Orthopedics;  Laterality: Left;  . INTERSTIM IMPLANT PLACEMENT  2011  . TONSILLECTOMY      SOCIAL HISTORY:   Social History  Substance Use Topics  . Smoking status: Never Smoker  . Smokeless tobacco: Never Used  . Alcohol use No    FAMILY HISTORY:   Family History  Problem Relation Age of Onset  . Heart disease Father     myocardial infarction  . Arthritis/Rheumatoid Mother   . Diabetes Sister   . Epilepsy Sister   . Breast cancer Neg Hx   . Colon cancer Neg Hx     DRUG ALLERGIES:   Allergies  Allergen Reactions  . Tramadol Nausea And Vomiting    REVIEW OF SYSTEMS:  CONSTITUTIONAL: No fever, fatigue or weakness.  EYES: No blurred or double vision.  EARS, NOSE, AND THROAT: No tinnitus or ear pain.  RESPIRATORY: No cough, shortness of breath, wheezing or hemoptysis.  CARDIOVASCULAR: No chest pain, orthopnea, edema.  GASTROINTESTINAL: No nausea, vomiting, diarrhea or abdominal pain.  GENITOURINARY: No dysuria, hematuria.  ENDOCRINE: No polyuria, nocturia,  HEMATOLOGY: No anemia, easy bruising or bleeding SKIN: No rash or lesion. Musculo skeletal:: Ambulatory difficulties.  neUROLOGIC: No tingling, numbness, weakness.  PSYCHIATRY: No anxiety or depression.   MEDICATIONS AT HOME:   Prior to Admission medications  Medication Sig Start Date End Date Taking? Authorizing Provider  albuterol (PROVENTIL HFA;VENTOLIN HFA) 108 (90 BASE) MCG/ACT inhaler Inhale 2 puffs into the lungs every 6 (six) hours as needed for wheezing or shortness of breath. 06/14/15   Rubbie Battiest, NP  amLODipine (NORVASC) 5 MG tablet Take 1 tablet (5 mg total) by mouth daily. 04/23/16   Gladstone Lighter, MD  aspirin 325 MG tablet Take 325 mg by mouth daily.    Historical Provider, MD  atorvastatin (LIPITOR) 40 MG tablet Take 40 mg by mouth at bedtime.    Historical Provider, MD   betamethasone valerate ointment (VALISONE) 0.1 % Apply 1 application topically 2 (two) times daily. Do not use for more than 2 weeks consecutively. 03/17/16   Jayce G Cook, DO  bimatoprost (LUMIGAN) 0.01 % SOLN Place 1 drop into both eyes at bedtime.    Historical Provider, MD  brinzolamide (AZOPT) 1 % ophthalmic suspension Place 1 drop into both eyes 2 (two) times daily.     Historical Provider, MD  cephALEXin (KEFLEX) 500 MG capsule Take 1 capsule (500 mg total) by mouth 2 (two) times daily. 07/15/16   Joanne Gavel, MD  Coenzyme Q10 (CO Q10) 100 MG CAPS Take 100 mg by mouth daily.     Historical Provider, MD  docusate sodium (COLACE) 100 MG capsule Take 1 capsule (100 mg total) by mouth 2 (two) times daily. 04/17/16   Srikar Sudini, MD  feeding supplement, ENSURE ENLIVE, (ENSURE ENLIVE) LIQD Take 237 mLs by mouth 2 (two) times daily between meals. 04/23/16   Gladstone Lighter, MD  fluticasone (FLONASE) 50 MCG/ACT nasal spray Place 2 sprays into both nostrils daily.    Historical Provider, MD  HYDROcodone-acetaminophen (NORCO/VICODIN) 5-325 MG tablet Take 1 tablet by mouth every 4 (four) hours as needed for moderate pain. 07/22/16   Lavonia Drafts, MD  levothyroxine (SYNTHROID, LEVOTHROID) 100 MCG tablet Take 100 mcg by mouth daily before breakfast.    Historical Provider, MD  memantine (NAMENDA) 10 MG tablet Take 10 mg by mouth 2 (two) times daily.    Historical Provider, MD  metoprolol tartrate (LOPRESSOR) 25 MG tablet Take 1 tablet (25 mg total) by mouth 2 (two) times daily. Patient taking differently: Take 25 mg by mouth daily.  04/23/16   Gladstone Lighter, MD  pantoprazole (PROTONIX) 40 MG tablet Take 1 tablet (40 mg total) by mouth daily. 03/09/16   Lucilla Lame, MD  psyllium (METAMUCIL) 0.52 G capsule Take 2.08 g by mouth 2 (two) times daily.     Historical Provider, MD  sertraline (ZOLOFT) 100 MG tablet Take 100 mg by mouth daily.    Historical Provider, MD  triamcinolone cream (KENALOG) 0.1 %  Apply 1 application topically 2 (two) times daily as needed (for itching).    Historical Provider, MD  vitamin B-12 (CYANOCOBALAMIN) 1000 MCG tablet Take 2,000 mcg by mouth daily.    Historical Provider, MD      VITAL SIGNS:  Blood pressure (!) 156/82, pulse 85, temperature 97.7 F (36.5 C), temperature source Oral, resp. rate 18, SpO2 95 %.  PHYSICAL EXAMINATION:  GENERAL:  82 y.o.-year-old patient lying in the bed with no acute distress.  EYES: Pupils equal, round, reactive to light and accommodation. No scleral icterus. Extraocular muscles intact.  HEENT: Head atraumatic, normocephalic. Oropharynx and nasopharynx clear.  NECK:  Supple, no jugular venous distention. No thyroid enlargement, no tenderness.  LUNGS: Normal breath sounds bilaterally, no wheezing, rales,rhonchi or crepitation. No use of accessory muscles  of respiration.  CARDIOVASCULAR: S1, S2 normal. No murmurs, rubs, or gallops.  ABDOMEN: Soft, nontender, nondistended. Bowel sounds present. No organomegaly or mass.  EXTREMITIES: No pedal edema, cyanosis, or clubbing.  NEUROLOGIC: Cranial nerves II through XII are intact. Muscle strength 5/5 in all extremities. Sensation intact. Gait not checked.  PSYCHIATRIC: The patient is alert and oriented x 3.  SKIN: No obvious rash, lesion, or ulcer.   LABORATORY PANEL:   CBC No results for input(s): WBC, HGB, HCT, PLT in the last 168 hours. ------------------------------------------------------------------------------------------------------------------  Chemistries  No results for input(s): NA, K, CL, CO2, GLUCOSE, BUN, CREATININE, CALCIUM, MG, AST, ALT, ALKPHOS, BILITOT in the last 168 hours.  Invalid input(s): GFRCGP ------------------------------------------------------------------------------------------------------------------  Cardiac Enzymes No results for input(s): TROPONINI in the last 168  hours. ------------------------------------------------------------------------------------------------------------------  RADIOLOGY:  Dg Ribs Unilateral W/chest Left  Result Date: 07/22/2016 CLINICAL DATA:  Fall at home. EXAM: LEFT RIBS AND CHEST - 3+ VIEW COMPARISON:  04/20/2016 FINDINGS: The lungs are clear wiithout focal pneumonia, edema, pneumothorax or pleural effusion. Interstitial markings are diffusely coarsened with chronic features. Cardiopericardial silhouette is at upper limits of normal for size. Hiatal hernia again noted. Oblique views of the left ribs show acute fracture of the antral lateral tenth rib. IMPRESSION: 1. No acute cardiopulmonary findings. 2. Left tenth rib fracture. Electronically Signed   By: Misty Stanley M.D.   On: 07/22/2016 09:06  Dg Hip Unilat With Pelvis 2-3 Views Left  Result Date: 07/22/2016 CLINICAL DATA:  Pain following fall EXAM: DG HIP (WITH OR WITHOUT PELVIS) 2-3V LEFT COMPARISON:  April 16, 2016 FINDINGS: Frontal pelvis as well as frontal and lateral left hip images were obtained. There is a total hip prosthesis on the left which appears well seated. There is no acute fracture or dislocation. There is a stimulator with its tip overlying the lower sacrum on the left. There is slight narrowing of the left hip joint. IMPRESSION: No fracture or dislocation. Left total hip prosthesis appears well-seated. Slight narrowing right hip joint. Stimulator lead overlies the lower sacrum on the left. Electronically Signed   By: Lowella Grip III M.D.   On: 07/22/2016 09:06   EKG:   Orders placed or performed during the hospital encounter of 07/15/16  . ED EKG  . ED EKG  . EKG 12-Lead  . EKG 12-Lead    IMPRESSION AND PLAN:   #1 ambulatory difficulties with recurrent falls: Likely secondary to ESBL UTI: Patient on isolation, started imipenem. Obtain PICC line Recent urine cultures from July 19 showed more than 100,000 colonies of ESBL resistant to  ceftriaxone, cefazolin, multiple antibiotics.  #2 multiple falls probably due to generalized deconditioning. #3 confusion evaluate for stroke. A CT of the head. Multiple falls with recent left hip fracture: Patient is a not complaining of  that much pain. Use narcotics sparingly.  4. GERD continue PPIs.  Plan discussed with the son.  All the records are reviewed and case discussed with ED provider. Management plans discussed with the patient, family and they are in agreement.  CODE STATUS: full  TOTAL TIME TAKING CARE OF THIS PATIENT:55 minutes.    Epifanio Lesches M.D on 07/22/2016 at 5:09 PM  Between 7am to 6pm - Pager - [phone]  After 6pm go to [email or web address] - password [email or web address] [facility]  Office  [phone]  CC: Primary care physician; Einar Pheasant, MD  Note: This dictation was prepared with Dragon dictation along with smaller phrase technology. Any transcriptional errors that result from  this process are unintentional.

## 2016-07-22 NOTE — Progress Notes (Signed)
PHARMACIST - PHYSICIAN ORDER COMMUNICATION  CONCERNING: P&T Medication Policy on Herbal Medications  DESCRIPTION:  This patient's order for:  Co-Q-10  has been noted.  This product(s) is classified as an "herbal" or natural product. Due to a lack of definitive safety studies or FDA approval, nonstandard manufacturing practices, plus the potential risk of unknown drug-drug interactions while on inpatient medications, the Pharmacy and Therapeutics Committee does not permit the use of "herbal" or natural products of this type within [facility].   ACTION TAKEN: The pharmacy department is unable to verify this order at this time. Please reevaluate patient's clinical condition at discharge and address if the herbal or natural product(s) should be resumed at that time.   

## 2016-07-22 NOTE — Progress Notes (Signed)
Patient ID: KEYANDRA WESTERFELD, female   DOB: 10-07-1932, 82 y.o.   MRN: [id number]   Subjective:    Patient ID: XAYLEE HIROSE, female    DOB: 1932/05/15, 82 y.o.   MRN: [id number]  HPI  Patient here as a work in with concerns regarding frequent falls and confusion.  She is accompanied by her son and the caretaker.  History obtained from all three of them.  Reports increased falls.  This is appears to be occurring almost daily now.  She has been in the ER on two occasions recently.  The last being earlier today.  See notes.  Xray revealed rib fracture.  No head scan.  Previous visit - declined.  Agreeable now to further w/up.  Son states she is much worse then last week.  Increased confusion and unsteadiness.  Increased falls.  Unclear if hit head.  States he knows she has with some falls.  No chest pain.  She is eating.  Not drinking well - per report.  She is on an abx for uti.  Still with odor in her urine.  No abdominal pain.  Increased diarrhea.     Past Medical History:  Diagnosis Date  . Arthritis   . Cancer ([place])    skin  . Colon polyps   . COPD (chronic obstructive pulmonary disease) ([place])   . Diverticulosis   . GERD (gastroesophageal reflux disease)    with esophageal stricture requiring dilatation x 2 (12/96 and 10/99)  . Glaucoma   . History of chicken pox   . Hypercholesterolemia   . Hypertension   . Hypothyroidism   . Migraines   . Peripheral neuropathy ([place])   . Urinary incontinence    Past Surgical History:  Procedure Laterality Date  . ABDOMINAL HYSTERECTOMY  1961   ovaries not removed  . APPENDECTOMY    . bladder tack  1976  . CATARACT EXTRACTION  1997   bilateral  . CHOLECYSTECTOMY    . HIP ARTHROPLASTY Left 04/16/2016   Procedure: ARTHROPLASTY BIPOLAR HIP (HEMIARTHROPLASTY);  Surgeon: Corky Mull, MD;  Location: [facility] ORS;  Service: Orthopedics;  Laterality: Left;  . INTERSTIM IMPLANT PLACEMENT  2011  . TONSILLECTOMY     Family History  Problem Relation Age  of Onset  . Heart disease Father     myocardial infarction  . Arthritis/Rheumatoid Mother   . Diabetes Sister   . Epilepsy Sister   . Breast cancer Neg Hx   . Colon cancer Neg Hx    Social History   Social History  . Marital status: Widowed    Spouse name: N/A  . Number of children: 2  . Years of education: N/A   Social History Main Topics  . Smoking status: Never Smoker  . Smokeless tobacco: Never Used  . Alcohol use No  . Drug use: No  . Sexual activity: Not Asked   Other Topics Concern  . None   Social History Narrative  . None    No facility-administered encounter medications on file as of 07/22/2016.    Outpatient Encounter Prescriptions as of 07/22/2016  Medication Sig  . albuterol (PROVENTIL HFA;VENTOLIN HFA) 108 (90 BASE) MCG/ACT inhaler Inhale 2 puffs into the lungs every 6 (six) hours as needed for wheezing or shortness of breath.  Marland Kitchen amLODipine (NORVASC) 5 MG tablet Take 1 tablet (5 mg total) by mouth daily.  Marland Kitchen aspirin 325 MG tablet Take 325 mg by mouth daily.  Marland Kitchen atorvastatin (LIPITOR) 40 MG tablet Take  40 mg by mouth at bedtime.  . betamethasone valerate ointment (VALISONE) 0.1 % Apply 1 application topically 2 (two) times daily. Do not use for more than 2 weeks consecutively.  . bimatoprost (LUMIGAN) 0.01 % SOLN Place 1 drop into both eyes at bedtime.  . brinzolamide (AZOPT) 1 % ophthalmic suspension Place 1 drop into both eyes 2 (two) times daily.   . cephALEXin (KEFLEX) 500 MG capsule Take 1 capsule (500 mg total) by mouth 2 (two) times daily.  . Coenzyme Q10 (CO Q10) 100 MG CAPS Take 100 mg by mouth daily.   Marland Kitchen docusate sodium (COLACE) 100 MG capsule Take 1 capsule (100 mg total) by mouth 2 (two) times daily.  . feeding supplement, ENSURE ENLIVE, (ENSURE ENLIVE) LIQD Take 237 mLs by mouth 2 (two) times daily between meals.  . fluticasone (FLONASE) 50 MCG/ACT nasal spray Place 2 sprays into both nostrils daily.  Marland Kitchen HYDROcodone-acetaminophen (NORCO/VICODIN)  5-325 MG tablet Take 1 tablet by mouth every 4 (four) hours as needed for moderate pain. (Patient not taking: Reported on 07/22/2016)  . levothyroxine (SYNTHROID, LEVOTHROID) 100 MCG tablet Take 100 mcg by mouth daily before breakfast.  . memantine (NAMENDA) 10 MG tablet Take 10 mg by mouth 2 (two) times daily.  . metoprolol tartrate (LOPRESSOR) 25 MG tablet Take 1 tablet (25 mg total) by mouth 2 (two) times daily. (Patient taking differently: Take 25 mg by mouth daily. )  . pantoprazole (PROTONIX) 40 MG tablet Take 1 tablet (40 mg total) by mouth daily.  . psyllium (METAMUCIL) 0.52 G capsule Take 2.08 g by mouth 2 (two) times daily.   . sertraline (ZOLOFT) 100 MG tablet Take 100 mg by mouth daily.  . vitamin B-12 (CYANOCOBALAMIN) 1000 MCG tablet Take 2,000 mcg by mouth daily.  Marland Kitchen triamcinolone cream (KENALOG) 0.1 % Apply 1 application topically 2 (two) times daily as needed (for itching).  . [DISCONTINUED] enoxaparin (LOVENOX) 40 MG/0.4ML injection Inject 0.4 mLs (40 mg total) into the skin daily. X 8 more days  . [DISCONTINUED] levothyroxine (SYNTHROID, LEVOTHROID) 100 MCG tablet TAKE 1 TABLET (100 MCG TOTAL) BY MOUTH DAILY BEFORE BREAKFAST.  . [DISCONTINUED] levothyroxine (SYNTHROID, LEVOTHROID) 100 MCG tablet TAKE 1 TABLET (100 MCG TOTAL) BY MOUTH DAILY BEFORE BREAKFAST.  . [DISCONTINUED] metFORMIN (GLUCOPHAGE) 500 MG tablet Take 1 tablet (500 mg total) by mouth daily.  . [DISCONTINUED] oxyCODONE (OXY IR/ROXICODONE) 5 MG immediate release tablet Take 1 tablet (5 mg total) by mouth every 6 (six) hours as needed for breakthrough pain.  . [DISCONTINUED] risperiDONE (RISPERDAL M-TABS) 1 MG disintegrating tablet Take 1 tablet (1 mg total) by mouth at bedtime.    Review of Systems  Constitutional: Positive for fatigue. Negative for appetite change and unexpected weight change.  HENT: Negative for congestion and sinus pressure.   Respiratory: Positive for cough. Negative for chest tightness and  shortness of breath.   Cardiovascular: Negative for chest pain, palpitations and leg swelling.  Gastrointestinal: Positive for diarrhea. Negative for abdominal pain, nausea and vomiting.  Genitourinary:       Bad odor to urine.    Musculoskeletal: Positive for gait problem.       Some hip pain.  Increased pain in her rib.   Neurological: Positive for weakness.       Objective:     Blood pressure rechecked by me:  138/86  Physical Exam  Constitutional: She appears well-developed and well-nourished.  Neck: Neck supple.  Cardiovascular: Normal rate and regular rhythm.   Pulmonary/Chest: Effort  normal and breath sounds normal. No respiratory distress.  Abdominal: Soft. Bowel sounds are normal. There is no tenderness.  Musculoskeletal: She exhibits no edema or tenderness.  Increased pain in her hip with ambulation.    Lymphadenopathy:    She has no cervical adenopathy.  Neurological:  Increased confusion.  Unable to stand on her own.  Drifts back with attempts with standing alone.  Unsteady gait.   Skin: No rash noted. No erythema.  Psychiatric:  Increased confusion.      BP (!) 144/90   Pulse 96   Temp 98.3 F (36.8 C) (Oral)   Wt 168 lb (76.2 kg)   SpO2 96%   BMI 27.96 kg/m  Wt Readings from Last 3 Encounters:  07/23/16 166 lb 12 oz (75.6 kg)  07/22/16 168 lb (76.2 kg)  07/15/16 160 lb (72.6 kg)     Lab Results  Component Value Date   WBC 7.5 07/23/2016   HGB 12.2 07/23/2016   HCT 36.6 07/23/2016   PLT 205 07/23/2016   GLUCOSE 133 (H) 07/23/2016   CHOL 170 01/21/2016   TRIG 124.0 01/21/2016   HDL 42.80 01/21/2016   LDLDIRECT 183.4 08/31/2013   LDLCALC 103 (H) 01/21/2016   ALT 12 (L) 04/30/2016   AST 15 04/30/2016   NA 139 07/23/2016   K 3.4 (L) 07/23/2016   CL 104 07/23/2016   CREATININE 0.66 07/23/2016   BUN 17 07/23/2016   CO2 24 07/23/2016   TSH 2.586 04/30/2016   INR 1.1 02/05/2014   HGBA1C 6.8 (H) 04/16/2016   MICROALBUR 2.6 (H) 09/18/2015     Dg Ribs Unilateral W/chest Left  Result Date: 07/22/2016 CLINICAL DATA:  Fall at home. EXAM: LEFT RIBS AND CHEST - 3+ VIEW COMPARISON:  04/20/2016 FINDINGS: The lungs are clear wiithout focal pneumonia, edema, pneumothorax or pleural effusion. Interstitial markings are diffusely coarsened with chronic features. Cardiopericardial silhouette is at upper limits of normal for size. Hiatal hernia again noted. Oblique views of the left ribs show acute fracture of the antral lateral tenth rib. IMPRESSION: 1. No acute cardiopulmonary findings. 2. Left tenth rib fracture. Electronically Signed   By: Misty Stanley M.D.   On: 07/22/2016 09:06  Dg Hip Unilat With Pelvis 2-3 Views Left  Result Date: 07/22/2016 CLINICAL DATA:  Pain following fall EXAM: DG HIP (WITH OR WITHOUT PELVIS) 2-3V LEFT COMPARISON:  April 16, 2016 FINDINGS: Frontal pelvis as well as frontal and lateral left hip images were obtained. There is a total hip prosthesis on the left which appears well seated. There is no acute fracture or dislocation. There is a stimulator with its tip overlying the lower sacrum on the left. There is slight narrowing of the left hip joint. IMPRESSION: No fracture or dislocation. Left total hip prosthesis appears well-seated. Slight narrowing right hip joint. Stimulator lead overlies the lower sacrum on the left. Electronically Signed   By: Lowella Grip III M.D.   On: 07/22/2016 09:06      Assessment & Plan:   Problem List Items Addressed This Visit    CVA [facility]cerebral vascular accident[facility])    Previous CVA.  Has had increased unsteadiness and confusion.  Concern about another stroke.  Feel head scan warranted.        Dementia    Has seen neurology.  Worsening confusion recently.  Scan.  Follow.       Diabetes mellitus (HCC)    Not checking sugars.  Last a1c 6.8.  Previously controlled.   Question  control now.  Follow.        Fall    Frequent falls.  Has been occurring daily now.  Unclear  etiology.  Probably multifactorial.  Has neuropathy.  Could contribute to some unsteadiness.  Recheck urine and treat if persistent infection.  May need to change abx.  With recent increased falls, confusion and unsteadiness, feel needs scan.  More intensive therapy.        Fracture of femoral neck, left (HCC)    S/p therapy for her hip fracture.  With increased pain since falls.  Consider xray to confirm no change or acute problem.        Hypertension    Blood pressure has been under good control.  Continue same medication regimen.  Follow pressures.        Neuropathy ([place])    Could be contributing to her unsteadiness, but she has had acute worsening of her gait and ability to stand and walk without falling.  See below for further w/up.        Unsteady gait    Gait has worsened significantly recently.  Has been working with PT.  Unable to stand and walk now without assistance.  Feel admission warranted.  Pt and family agree.  Will need labs, scan and treat infection.  Hospitalist notified and agree to directly admit pt.         Other Visit Diagnoses   None.    I spent 45 minutes with the patient and more than 50% of the time was spent in consultation regarding the above.     Einar Pheasant, MD

## 2016-07-22 NOTE — ED Provider Notes (Signed)
[facility] Emergency Department Provider Note   ____________________________________________    I have reviewed the triage vital signs and the nursing notes.   HISTORY  Chief Complaint Fall  Patient has a history of dementia which limits history and review of systems   HPI Sandra Kaufman is a 82 y.o. female who presents after a fall. The patient states that she bumped into another lady and fell onto her left side. She complains of left sided chest pain especially with palpation. Apparently she recently had a hip surgery within the last month and she complains of some discomfort to her left hip but is unsure if this is chronic or not. She denies chest pain or shortness of breath. No dizziness. Patient has a history of frequent falls reportedly   Past Medical History:  Diagnosis Date  . Arthritis   . Cancer ([place])    skin  . Colon polyps   . COPD (chronic obstructive pulmonary disease) ([place])   . Diverticulosis   . GERD (gastroesophageal reflux disease)    with esophageal stricture requiring dilatation x 2 (12/96 and 10/99)  . Glaucoma   . History of chicken pox   . Hypercholesterolemia   . Hypertension   . Hypothyroidism   . Migraines   . Peripheral neuropathy ([place])   . Urinary incontinence     Patient Active Problem List   Diagnosis Date Noted  . Dementia with psychosis 04/22/2016  . Fracture of femoral neck, left ([place]) 04/16/2016  . Fracture, femur, neck, left, closed, initial encounter 04/16/2016  . Right lower quadrant pain 03/29/2016  . Vomiting 03/17/2016  . Rash 03/17/2016  . Diarrhea 02/18/2016  . Adult hypothyroidism 01/29/2016  . Fall 01/19/2016  . Anemia 10/20/2015  . Headache, migraine 10/10/2015  . HLD (hyperlipidemia) 10/10/2015  . Glaucoma 10/10/2015  . Chronic obstructive pulmonary disease ([place]) 10/10/2015  . Arthritis 10/10/2015  . Hypertrophic toenail 07/23/2015  . Health care maintenance 05/27/2015  .  Difficulty in walking 10/04/2014  . Cerebral ataxia ([place]) 10/04/2014  . Unsteady gait 08/22/2014  . Obesity 08/22/2014  . Dysphagia 07/14/2014  . Amnesia 05/24/2014  . B12 deficiency 05/24/2014  . 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 SOB (shortness of breath) 05/06/2014  . CVA (cerebral vascular accident) ([place]) 03/04/2014  . Diverticulosis 04/29/2013  . GERD (gastroesophageal reflux disease) 11/27/2012  . Urinary incontinence 11/27/2012  . Ovarian cyst 11/27/2012  . Depression 11/27/2012  . Neuropathy ([place]) 11/27/2012  . Hypothyroidism 11/15/2012  . Hypercholesterolemia 11/15/2012  . Diabetes mellitus ([place]) 11/15/2012  . Hypertension 11/15/2012    Past Surgical History:  Procedure Laterality Date  . ABDOMINAL HYSTERECTOMY  1961   ovaries not removed  . APPENDECTOMY    . bladder tack  1976  . CATARACT EXTRACTION  1997   bilateral  . CHOLECYSTECTOMY    . HIP ARTHROPLASTY Left 04/16/2016   Procedure: ARTHROPLASTY BIPOLAR HIP (HEMIARTHROPLASTY);  Surgeon: Corky Mull, MD;  Location: [facility] ORS;  Service: Orthopedics;  Laterality: Left;  . INTERSTIM IMPLANT PLACEMENT  2011  . TONSILLECTOMY      Prior to Admission medications   Medication Sig Start Date End Date Taking? Authorizing Provider  albuterol (PROVENTIL HFA;VENTOLIN HFA) 108 (90 BASE) MCG/ACT inhaler Inhale 2 puffs into the lungs every 6 (six) hours as needed for wheezing or shortness of breath. 06/14/15   Rubbie Battiest, NP  amLODipine (NORVASC) 5 MG tablet Take 1 tablet (5 mg total) by mouth daily. 04/23/16   Hart Rochester  Kalisetti, MD  aspirin 325 MG tablet Take 325 mg by mouth daily.    Historical Provider, MD  atorvastatin (LIPITOR) 40 MG tablet Take 40 mg by mouth at bedtime.    Historical Provider, MD  betamethasone valerate ointment (VALISONE) 0.1 % Apply 1 application topically 2 (two) times daily. Do not use for more than 2 weeks consecutively. 03/17/16   Jayce G Cook, DO  bimatoprost (LUMIGAN) 0.01 % SOLN Place 1 drop into  both eyes at bedtime.    Historical Provider, MD  brinzolamide (AZOPT) 1 % ophthalmic suspension Place 1 drop into both eyes 2 (two) times daily.     Historical Provider, MD  cephALEXin (KEFLEX) 500 MG capsule Take 1 capsule (500 mg total) by mouth 2 (two) times daily. 07/15/16   Joanne Gavel, MD  Coenzyme Q10 (CO Q10) 100 MG CAPS Take 100 mg by mouth daily.     Historical Provider, MD  docusate sodium (COLACE) 100 MG capsule Take 1 capsule (100 mg total) by mouth 2 (two) times daily. 04/17/16   Srikar Sudini, MD  enoxaparin (LOVENOX) 40 MG/0.4ML injection Inject 0.4 mLs (40 mg total) into the skin daily. X 8 more days 04/23/16   Gladstone Lighter, MD  feeding supplement, ENSURE ENLIVE, (ENSURE ENLIVE) LIQD Take 237 mLs by mouth 2 (two) times daily between meals. 04/23/16   Gladstone Lighter, MD  fluticasone (FLONASE) 50 MCG/ACT nasal spray Place 2 sprays into both nostrils daily.    Historical Provider, MD  levothyroxine (SYNTHROID, LEVOTHROID) 100 MCG tablet Take 100 mcg by mouth daily before breakfast.    Historical Provider, MD  levothyroxine (SYNTHROID, LEVOTHROID) 100 MCG tablet TAKE 1 TABLET (100 MCG TOTAL) BY MOUTH DAILY BEFORE BREAKFAST. 04/30/16   Einar Pheasant, MD  levothyroxine (SYNTHROID, LEVOTHROID) 100 MCG tablet TAKE 1 TABLET (100 MCG TOTAL) BY MOUTH DAILY BEFORE BREAKFAST. 06/11/16   Einar Pheasant, MD  memantine (NAMENDA) 10 MG tablet Take 10 mg by mouth 2 (two) times daily.    Historical Provider, MD  metFORMIN (GLUCOPHAGE) 500 MG tablet Take 1 tablet (500 mg total) by mouth daily. 03/09/16   Einar Pheasant, MD  metoprolol tartrate (LOPRESSOR) 25 MG tablet Take 1 tablet (25 mg total) by mouth 2 (two) times daily. 04/23/16   Gladstone Lighter, MD  oxyCODONE (OXY IR/ROXICODONE) 5 MG immediate release tablet Take 1 tablet (5 mg total) by mouth every 6 (six) hours as needed for breakthrough pain. 04/17/16   Srikar Sudini, MD  pantoprazole (PROTONIX) 40 MG tablet Take 1 tablet (40 mg total) by  mouth daily. 03/09/16   Lucilla Lame, MD  psyllium (METAMUCIL) 0.52 G capsule Take 2.08 g by mouth 2 (two) times daily.     Historical Provider, MD  risperiDONE (RISPERDAL M-TABS) 1 MG disintegrating tablet Take 1 tablet (1 mg total) by mouth at bedtime. 04/23/16   Gladstone Lighter, MD  sertraline (ZOLOFT) 100 MG tablet Take 100 mg by mouth daily.    Historical Provider, MD  triamcinolone cream (KENALOG) 0.1 % Apply 1 application topically 2 (two) times daily as needed (for itching).    Historical Provider, MD  vitamin B-12 (CYANOCOBALAMIN) 1000 MCG tablet Take 2,000 mcg by mouth daily.    Historical Provider, MD  .   Allergies Tramadol  Family History  Problem Relation Age of Onset  . Heart disease Father     myocardial infarction  . Arthritis/Rheumatoid Mother   . Diabetes Sister   . Epilepsy Sister   . Breast cancer Neg Hx   .  Colon cancer Neg Hx     Social History Social History  Substance Use Topics  . Smoking status: Never Smoker  . Smokeless tobacco: Never Used  . Alcohol use No    Review of Systems limited by dementia  Constitutional: No dizziness Eyes: No visual changes.  ENT: No neck pain Cardiovascular: As above Respiratory: Denies shortness of breath. Gastrointestinal: No abdominal pain. Genitourinary: Patient is being treated for a UTI Musculoskeletal: Negative for back pain. Skin: Negative for laceration or abrasion Neurological: Negative for headaches or weakness  10-point ROS otherwise negative.  ____________________________________________   PHYSICAL EXAM:     Constitutional: Alert. No acute distress. Pleasant and interactive Eyes: Conjunctivae are normal.  Head: Atraumatic. Nose: No congestion/rhinnorhea. Mouth/Throat: Mucous membranes are moist.   Neck:  Painless ROM , no vertebral tenderness to palpation Cardiovascular: Normal rate, regular rhythm. Grossly normal heart sounds.  Good peripheral circulation. Respiratory: Normal respiratory  effort.  No retractions. Lungs CTAB. Gastrointestinal: Soft and nontender. No distention.  No CVA tenderness. Genitourinary: deferred Musculoskeletal: No lower extremity tenderness nor edema.  Warm and well perfused. Patient is able to lift both legs but does complain of pain in her left hip when she lifts her left leg. Mild tenderness to palpation along the left lateral hip. 2+ distal pulses. Full range of motion of upper extremities without pain.patient has tenderness to palpation along the left lateral inferior ribs Neurologic:  Normal speech and language. No gross focal neurologic deficits are appreciated.  Skin:  Skin is warm, dry and intact. No rash noted. Psychiatric: Mood and affect are normal. Speech and behavior are normal.  ____________________________________________   LABS (all labs ordered are listed, but only abnormal results are displayed)  Labs Reviewed - No data to display ____________________________________________  EKG  None ____________________________________________  RADIOLOGY  10th rib fracture on the left ____________________________________________   PROCEDURES  Procedure(s) performed: No    Critical Care performed: No ____________________________________________   INITIAL IMPRESSION / ASSESSMENT AND PLAN / ED COURSE  Pertinent labs & imaging results that were available during my care of the patient were reviewed by me and considered in my medical decision making (see chart for details).  Patient presents after a reported mechanical fall. She has tenderness in the left lateral inferior ribs and mild discomfort to the left hip which I suspect is residual from her surgery. We will image both of these areas. Do not feel blood work is necessary at this time.  Clinical Course   ____________________________________________  ----------------------------------------- 10:34 AM on 07/22/2016 -----------------------------------------  Discussed with  son, caregiver and patient imaging results. Recommended acetaminophen, ice and either Profen as needed. Will provide Vicodin as well only of significant pain with notification to take a laxative. Incentive spirometer provided. Patient has PCP appointment today  FINAL CLINICAL IMPRESSION(S) / ED DIAGNOSES  Final diagnoses:  Rib fracture, left, closed, initial encounter      NEW MEDICATIONS STARTED DURING THIS VISIT:  New Prescriptions   No medications on file     Note:  This document was prepared using Dragon voice recognition software and may include unintentional dictation errors.    Lavonia Drafts, MD 07/22/16 1034

## 2016-07-22 NOTE — Assessment & Plan Note (Addendum)
S/p therapy for her hip fracture.  With increased pain since falls.  Consider xray to confirm no change or acute problem.

## 2016-07-22 NOTE — Assessment & Plan Note (Signed)
Has seen neurology.  Worsening confusion recently.  Scan.  Follow.

## 2016-07-22 NOTE — Progress Notes (Signed)
Pharmacy Antibiotic Note  Sandra Kaufman is a 82 y.o. female admitted on 07/22/2016 with UTI (ESBL).  Pharmacy has been consulted for imipenem dosing.  Plan: Will order renally adjusted dose of imipenem 500 mg IV q8h  Height: 5\' 7"  (170.2 cm) Weight: 176 lb 5.9 oz (80 kg) IBW/kg (Calculated) : 61.6  Temp (24hrs), Avg:98.2 F (36.8 C), Min:97.7 F (36.5 C), Max:98.9 F (37.2 C)   Recent Labs Lab 07/22/16 2002  CREATININE 0.79    Estimated Creatinine Clearance: 59.1 mL/min (by C-G formula based on SCr of 0.8 mg/dL).    Allergies  Allergen Reactions  . Tramadol Nausea And Vomiting    Antimicrobials this admission:   Dose adjustments this admission:   Microbiology results:   Thank you for allowing pharmacy to be a part of this patient's care.  Rocky Morel 07/22/2016 9:41 PM

## 2016-07-22 NOTE — Assessment & Plan Note (Signed)
Could be contributing to her unsteadiness, but she has had acute worsening of her gait and ability to stand and walk without falling.  See below for further w/up.

## 2016-07-22 NOTE — Progress Notes (Signed)
Pre visit review using our clinic review tool, if applicable. No additional management support is needed unless otherwise documented below in the visit note. 

## 2016-07-23 ENCOUNTER — Ambulatory Visit: Payer: Medicare Other | Admitting: Internal Medicine

## 2016-07-23 ENCOUNTER — Inpatient Hospital Stay: Payer: Medicare Other

## 2016-07-23 ENCOUNTER — Encounter: Payer: Self-pay | Admitting: Internal Medicine

## 2016-07-23 DIAGNOSIS — I4581 Long QT syndrome: Secondary | ICD-10-CM | POA: Diagnosis not present

## 2016-07-23 LAB — BASIC METABOLIC PANEL
Anion gap: 7 (ref 5–15)
Anion gap: 11 (ref 5–15)
BUN: 20 mg/dL (ref 6–20)
BUN: 17 mg/dL (ref 6–20)
CO2: 26 mmol/L (ref 22–32)
CO2: 24 mmol/L (ref 22–32)
Calcium: 8.9 mg/dL (ref 8.9–10.3)
Calcium: 9.1 mg/dL (ref 8.9–10.3)
Chloride: 105 mmol/L (ref 101–111)
Chloride: 104 mmol/L (ref 101–111)
Creatinine, Ser: 0.77 mg/dL (ref 0.44–1.00)
Creatinine, Ser: 0.66 mg/dL (ref 0.44–1.00)
GFR calc Af Amer: >60 mL/min (ref >60)
GFR calc Af Amer: >60 mL/min (ref >60)
GFR calc non Af Amer: >60 mL/min (ref >60)
GFR calc non Af Amer: >60 mL/min (ref >60)
Glucose, Bld: 109 mg/dL — ABNORMAL HIGH (ref 65–99)
Glucose, Bld: 133 mg/dL — ABNORMAL HIGH (ref 65–99)
Potassium: 3.1 mmol/L — ABNORMAL LOW (ref 3.5–5.1)
Potassium: 3.4 mmol/L — ABNORMAL LOW (ref 3.5–5.1)
Sodium: 138 mmol/L (ref 135–145)
Sodium: 139 mmol/L (ref 135–145)

## 2016-07-23 LAB — CBC
HCT: 36.6 % (ref 35.0–47.0)
Hemoglobin: 12.2 g/dL (ref 12.0–16.0)
MCH: 29.4 pg (ref 26.0–34.0)
MCHC: 33.3 g/dL (ref 32.0–36.0)
MCV: 88.5 fL (ref 80.0–100.0)
Platelets: 205 10*3/uL (ref 150–440)
RBC: 4.14 MIL/uL (ref 3.80–5.20)
RDW: 15.3 % — ABNORMAL HIGH (ref 11.5–14.5)
WBC: 7.5 10*3/uL (ref 3.6–11.0)

## 2016-07-23 LAB — URINALYSIS COMPLETE WITH MICROSCOPIC (ARMC ONLY)
Bilirubin Urine: NEGATIVE
Glucose, UA: NEGATIVE mg/dL
Hgb urine dipstick: NEGATIVE
Ketones, ur: NEGATIVE mg/dL
Leukocytes, UA: NEGATIVE
Nitrite: NEGATIVE
Protein, ur: NEGATIVE mg/dL
Specific Gravity, Urine: 1.004 — ABNORMAL LOW (ref 1.005–1.030)
Squamous Epithelial / LPF: NONE SEEN
pH: 7 (ref 5.0–8.0)

## 2016-07-23 MED ORDER — SODIUM CHLORIDE 0.9 % IV SOLN
1.0000 g | INTRAVENOUS | Status: DC
  Administered 2016-07-24: 1 g via INTRAVENOUS
  Filled 2016-07-23 (×2): qty 1

## 2016-07-23 MED ORDER — HALOPERIDOL LACTATE 5 MG/ML IJ SOLN
1.0000 mg | Freq: Once | INTRAMUSCULAR | Status: AC
  Administered 2016-07-23: 1 mg via INTRAVENOUS
  Filled 2016-07-23: qty 1

## 2016-07-23 MED ORDER — HALOPERIDOL LACTATE 5 MG/ML IJ SOLN
2.0000 mg | Freq: Four times a day (QID) | INTRAMUSCULAR | Status: DC | PRN
  Administered 2016-07-23 – 2016-07-25 (×3): 2 mg via INTRAVENOUS
  Filled 2016-07-23 (×3): qty 1

## 2016-07-23 MED ORDER — QUETIAPINE FUMARATE 25 MG PO TABS
12.5000 mg | ORAL_TABLET | Freq: Once | ORAL | Status: AC
  Administered 2016-07-23: 12.5 mg via ORAL
  Filled 2016-07-23: qty 2

## 2016-07-23 MED ORDER — POTASSIUM CHLORIDE CRYS ER 20 MEQ PO TBCR
40.0000 meq | EXTENDED_RELEASE_TABLET | Freq: Once | ORAL | Status: AC
  Administered 2016-07-23: 40 meq via ORAL
  Filled 2016-07-23: qty 2

## 2016-07-23 MED ORDER — QUETIAPINE FUMARATE 25 MG PO TABS
12.5000 mg | ORAL_TABLET | Freq: Every day | ORAL | Status: DC
  Administered 2016-07-23 – 2016-07-24 (×2): 12.5 mg via ORAL
  Filled 2016-07-23 (×2): qty 1

## 2016-07-23 NOTE — Consult Note (Signed)
   [facility] CM Inpatient Consult   07/23/2016  Sandra Kaufman 30-Jul-1932 [id number]   Patient screened for potential [place] Management services. Patient is eligible for [place]. Electronic medical record reveals patient's discharge plan is SNF. [facility] Care Management services not appropriate at this time. If patient's post hospital needs change please place a [facility] Care Management consult. For questions please contact:   Taj Nevins RN, [facility] Liaison  [phone]) Business Mobile [phone]) Toll free office

## 2016-07-23 NOTE — Care Management (Signed)
Patient was active with [facility] prior to admission, SNF recommended by PT

## 2016-07-23 NOTE — Clinical Social Work Note (Signed)
Clinical Social Work Assessment  Patient Details  Name: Sandra Kaufman MRN: [id number] Date of Birth: 1932/09/23  Date of referral:  07/23/16               Reason for consult:  Facility Placement                Permission sought to share information with:  Chartered certified accountant granted to share information::  Yes, Verbal Permission Granted  Name::      [place]::   [place]   Relationship::     Contact Information:     Housing/Transportation Living arrangements for the past 2 months:  [place] of Information:  Patient, Adult Children Patient Interpreter Needed:  None Criminal Activity/Legal Involvement Pertinent to Current Situation/Hospitalization:  No - Comment as needed Significant Relationships:  Adult Children Lives with:  Other (Comment) (24/7 caregivers ) Do you feel safe going back to the place where you live?  Yes Need for family participation in patient care:  Yes (Comment)  Care giving concerns:  Patient lives in [place] with 24/7 caregivers.    Social Worker assessment / plan:  Holiday representative ([place]) reviewed chart and noted that patient has a history of going to SNF. Patient had a hip fracture and discharged to [facility] from [facility] on 04/23/16. PT is recommending SNF on this admission. CSW met with patient to discuss D/C plan. Patient was sitting up in the chair and was pleasantly confused. Patient did agree to a SNF search. CSW contacted patient's son Sandra Kaufman. Per son patient lives in [place] and has hired 24/7 caregivers. Son reported that he felt patient was released from [facility] too soon. CSW explained that PT is recommending SNF again. CSW explained that patient may not have the full 100 SNF days left. Per son patient stayed at [facility] for 3 weeks. CSW explained that patient will also require a 3 night qualifying inpatient stay at [facility] in order for Medicare to pay for SNF. Son verbalized his  understanding and is agreeable to SNF search. Son prefers [employer]   FL2 complete and faxed out. CSW will continue to follow and assist as needed.   Employment status:  Disabled (Comment on whether or not currently receiving Disability), Retired Forensic scientist:  Medicare PT Recommendations:  [place] / Referral to community resources:  [place]  Patient/Family's Response to care:  Patient and son are agreeable to [employer] and prefer [employer]   Patient/Family's Understanding of and Emotional Response to Diagnosis, Current Treatment, and Prognosis:  Patient and son were pleasant and thanked CSW for assistance.   Emotional Assessment Appearance:  Appears stated age Attitude/Demeanor/Rapport:    Affect (typically observed):  Accepting, Adaptable, Pleasant Orientation:  Oriented to Self, Fluctuating Orientation (Suspected and/or reported [place]) Alcohol / Substance use:  Not Applicable Psych involvement (Current and /or in the community):  No (Comment)  Discharge Needs  Concerns to be addressed:  Discharge Planning Concerns Readmission within the last 30 days:  No Current discharge risk:  Chronically ill, Cognitively Impaired, Dependent with Mobility Barriers to Discharge:  Continued Medical Work up   [employer], Veronia Beets, LCSW 07/23/2016, 10:57 AM

## 2016-07-23 NOTE — Progress Notes (Addendum)
Patient ID: Sandra Kaufman, female   DOB: 03/31/1932, 82 y.o.   MRN: [id number]  [employer] PROGRESS NOTE  Sandra Kaufman [id number] DOB: [birth year] DOA: 07/22/2016 PCP: Einar Pheasant, MD  HPI/Subjective:   Objective: Vitals:   07/23/16 0437 07/23/16 0844  BP: (!) 193/96 140/78  Pulse: 69 70  Resp: 16 17  Temp: 97.8 F (36.6 C) 97.9 F (36.6 C)    Filed Weights   07/22/16 1858 07/23/16 0437 07/23/16 0449  Weight: 80 kg (176 lb 5.9 oz) 75.6 kg (166 lb 12 oz) 75.6 kg (166 lb 12 oz)    ROS: Review of Systems  Unable to perform ROS: Dementia   Exam: Physical Exam  HENT:  Nose: No mucosal edema.  Mouth/Throat: No oropharyngeal exudate or posterior oropharyngeal edema.  Eyes: Conjunctivae, EOM and lids are normal. Pupils are equal, round, and reactive to light.  Neck: No JVD present. Carotid bruit is not present. No edema present. No thyroid mass and no thyromegaly present.  Cardiovascular: S1 normal and S2 normal.  Exam reveals no gallop.   No murmur heard. Pulses:      Dorsalis pedis pulses are 2+ on the right side, and 2+ on the left side.  Respiratory: No respiratory distress. She has no wheezes. She has no rhonchi. She has no rales.  GI: Soft. Bowel sounds are normal. There is no tenderness.  Musculoskeletal:       Right ankle: She exhibits no swelling.       Left ankle: She exhibits no swelling.  Lymphadenopathy:    She has no cervical adenopathy.  Neurological: She is alert. No cranial nerve deficit.  Able to straight leg raise bilaterally  Skin: Skin is warm. No rash noted. Nails show no clubbing.  Psychiatric: She has a normal mood and affect.      Data Reviewed: Basic Metabolic Panel:  Recent Labs Lab 07/22/16 2002 07/22/16 2253 07/23/16 0341  NA  --  138 139  K  --  3.1* 3.4*  CL  --  105 104  CO2  --  26 24  GLUCOSE  --  109* 133*  BUN  --  20 17  CREATININE 0.79 0.77 0.66  CALCIUM  --  8.9 9.1   CBC:  Recent Labs Lab  07/22/16 2253 07/23/16 0341  WBC 7.7 7.5  HGB 11.7* 12.2  HCT 35.2 36.6  MCV 87.1 88.5  PLT 203 205    Recent Results (from the past 240 hour(s))  Urine culture     Status: Abnormal   Collection Time: 07/15/16 12:46 PM  Result Value Ref Range Status   Specimen Description URINE, CLEAN CATCH  Final   Special Requests NONE  Final   Culture (A)  Final    >=100,000 COLONIES/mL ESCHERICHIA COLI Confirmed Extended Spectrum Beta-Lactamase Producer (ESBL) Performed at [facility]    Report Status 07/17/2016 FINAL  Final   Organism ID, Bacteria ESCHERICHIA COLI (A)  Final      Susceptibility   Escherichia coli - MIC*    AMPICILLIN >=32 RESISTANT Resistant     CEFAZOLIN >=64 RESISTANT Resistant     CEFTRIAXONE >=64 RESISTANT Resistant     CIPROFLOXACIN >=4 RESISTANT Resistant     GENTAMICIN <=1 SENSITIVE Sensitive     IMIPENEM <=0.25 SENSITIVE Sensitive     NITROFURANTOIN <=16 SENSITIVE Sensitive     TRIMETH/SULFA <=20 SENSITIVE Sensitive     AMPICILLIN/SULBACTAM >=32 RESISTANT Resistant     PIP/TAZO <=4 SENSITIVE Sensitive  Extended ESBL POSITIVE Resistant     * >=100,000 COLONIES/mL ESCHERICHIA COLI     Studies: Dg Ribs Unilateral W/chest Left  Result Date: 07/22/2016 CLINICAL DATA:  Fall at home. EXAM: LEFT RIBS AND CHEST - 3+ VIEW COMPARISON:  04/20/2016 FINDINGS: The lungs are clear wiithout focal pneumonia, edema, pneumothorax or pleural effusion. Interstitial markings are diffusely coarsened with chronic features. Cardiopericardial silhouette is at upper limits of normal for size. Hiatal hernia again noted. Oblique views of the left ribs show acute fracture of the antral lateral tenth rib. IMPRESSION: 1. No acute cardiopulmonary findings. 2. Left tenth rib fracture. Electronically Signed   By: Kennith Center M.D.   On: 07/22/2016 09:06  Ct Head Wo Contrast  Result Date: 07/23/2016 CLINICAL DATA:  Confusion.  Fall 1 day prior EXAM: CT HEAD WITHOUT CONTRAST  TECHNIQUE: Contiguous axial images were obtained from the base of the skull through the vertex without intravenous contrast. COMPARISON:  April 22, 2016 FINDINGS: Brain: Mild diffuse atrophy is stable. There is no intracranial mass, hemorrhage, extra-axial fluid collection, or midline shift. There is patchy small vessel disease in the centra semiovale bilaterally, stable. There is evidence of a prior lacunar infarct in the posterior limb of the right internal capsule near the genu. There is evidence of a prior small infarct in the left posterior thalamus, stable. There is no new gray-white compartment lesion. No acute infarct evident. Vascular: No hyperdense vessel is evident. There are foci of calcification in the distal carotid siphon regions bilaterally. Skull: There is a small right frontal scalp hematoma. The bony calvarium appears intact. Sinuses/Orbits: No orbital lesions are evident. Visualized paranasal sinuses are clear. Other: Mastoid air cells are clear. IMPRESSION: Atrophy with periventricular small vessel disease and prior small infarcts as noted above. No acute infarct evident. No intracranial mass, hemorrhage, or extra-axial fluid collection. Foci of calcification in each distal carotid siphon. Right frontal scalp hematoma. No underlying fracture. Electronically Signed   By: Bretta Bang III M.D.   On: 07/23/2016 11:06  Dg Hip Unilat With Pelvis 2-3 Views Left  Result Date: 07/22/2016 CLINICAL DATA:  Pain following fall EXAM: DG HIP (WITH OR WITHOUT PELVIS) 2-3V LEFT COMPARISON:  April 16, 2016 FINDINGS: Frontal pelvis as well as frontal and lateral left hip images were obtained. There is a total hip prosthesis on the left which appears well seated. There is no acute fracture or dislocation. There is a stimulator with its tip overlying the lower sacrum on the left. There is slight narrowing of the left hip joint. IMPRESSION: No fracture or dislocation. Left total hip prosthesis appears  well-seated. Slight narrowing right hip joint. Stimulator lead overlies the lower sacrum on the left. Electronically Signed   By: Bretta Bang III M.D.   On: 07/22/2016 09:06   Scheduled Meds: . amLODipine  5 mg Oral Daily  . aspirin  325 mg Oral Daily  . brinzolamide  1 drop Both Eyes BID  . docusate sodium  100 mg Oral BID  . feeding supplement (ENSURE ENLIVE)  237 mL Oral BID BM  . fluticasone  2 spray Each Nare Daily  . heparin  5,000 Units Subcutaneous Q8H  . imipenem-cilastatin  500 mg Intravenous Q8H  . levothyroxine  100 mcg Oral QAC breakfast  . memantine  10 mg Oral BID  . metoprolol tartrate  25 mg Oral BID  . pantoprazole  40 mg Oral Daily  . psyllium  0.5 packet Oral BID  . sertraline  100 mg  Oral Daily  . vitamin B-12  2,000 mcg Oral Daily    Assessment/Plan:  1. Acute encephalopathy.  Could be secondary to multidrug resistant UTI versus worsening dementia. Son concerned about other things going on. Mri brainCan't be done secondary to stimulator. Check TSH, RPR and B12. Seroquel at night. 2. Multidrug resistant UTI- speak with pharmacy about switch to invanz. picc line. 3. Frequent falls. Rib fracture 10th rib. Physical therapy evaluation. May need a rehabilitation in potentially long-term placement. Already has 24 7 care at home 4. Hypothyroidism unspecified continue levothyroxin 5. GERD on PPI 6. Hyperlipidemia unspecified DC statin  Code Status:     Code Status Orders        Start     Ordered   07/22/16 1602  Full code  Continuous     07/22/16 1604    Code Status History    Date Active Date Inactive Code Status Order ID Comments User Context   07/22/2016  4:05 PM 07/22/2016  4:12 PM Full Code [id number]  Epifanio Lesches, MD Inpatient   04/16/2016  2:05 AM 04/23/2016  7:53 PM DNR [id number]  Lance Coon, MD Inpatient    Advance Directive Documentation   Flowsheet Row Most Recent Value  Type of Advance Directive  Healthcare Power of Attorney   Pre-existing out of facility DNR order (yellow form or pink MOST form)  No data  "MOST" Form in Place?  No data     Family Communication: Son at the bedside Disposition Plan: Likely out to rehabilitation after 3 nights day  Antibiotics:  Currently on Primaxin and will speak with the pharmacist about switching to [place]  Time spent: 39 minutes  Leslye Peer, [employer]

## 2016-07-23 NOTE — Progress Notes (Signed)
Notified Dr. Leslye Peer that patient remains agitated despite Seroquel given earlier in day, per MD okay to give 1 mg haldol once IV. Telephone order.

## 2016-07-23 NOTE — Evaluation (Signed)
Physical Therapy Evaluation Patient Details Name: Sandra Kaufman MRN: [id number] DOB: November 26, 1932 Today's Date: 07/23/2016   History of Present Illness  Sandra Kaufman was admitted after fall at home with L 10th rib Fx; waiting on head CT to rule out other Dx. Prior Hx includes DM, peripheral neuropathy, HTN, CVA, dementia, and COPD  Clinical Impression  Pt is a pleasant and cooperative 82 y/o female with a Hx of falls and declining balance/stability. She was admitted after a fall with a Fx of the L 10th rib and is generally confused and not a reliable historian. She is HOH but will respond when spoken to, even if she is unsure of what you said, making her appear more confused than she is. Her strength is generally 4/5 in UEs and LEs. She requires continual assist in sitting and standing, up to mod assist X 2 primarily due to posterior lean. She was able to briefly sit independently and tolerated mild perturbation with verbal cues. She walked 3' feet to chair with +2 assist for safety. Pt was able to follow directions and was able to improve her posture with cuing; she expressed that she wanted to get better. She will benefit from continued therapy at this time to address her deficits in strength, balance, endurance, and safety awareness. She is appropriate for PT in a SNF.    Follow Up Recommendations SNF    Equipment Recommendations       Recommendations for Other Services       Precautions / Restrictions Precautions Precautions: Fall Precaution Comments: Pt. has severe posterior lean Restrictions Weight Bearing Restrictions: No      Mobility  Bed Mobility Overal bed mobility: Needs Assistance Bed Mobility: Supine to Sit     Supine to sit: Mod assist     General bed mobility comments: Pt requires verbal and tactile cuing; strength is adequate for bed mobility, but balance and movement strategy are impaired  Transfers Overall transfer level: Needs assistance Equipment used:  Rolling walker (2 wheeled) Transfers: Sit to/from Stand Sit to Stand: Mod assist;+2 safety/equipment         General transfer comment: Pt is able to stand easily when positioned properly;without cues she will extend her LEs anteriorly and lean posteriorly an attemps to stand. +2 assist for safety  Ambulation/Gait Ambulation/Gait assistance: +2 safety/equipment;Min assist Ambulation Distance (Feet): 3 Feet Assistive device: Rolling walker (2 wheeled) Gait Pattern/deviations: Step-to pattern;Decreased stride length;Leaning posteriorly;Narrow base of support     General Gait Details: Pt requires continuous cuing for foot placement, requires more effort to advance on L. Posterior lean is continuous throughout ambulation, but worse when pt feels unsteady/unsafe.   Stairs            Wheelchair Mobility    Modified Rankin (Stroke Patients Only)       Balance Overall balance assessment: Needs assistance;History of Falls Sitting-balance support: Bilateral upper extremity supported Sitting balance-Leahy Scale: Poor Sitting balance - Comments: Severe posterior lean, temporarily correctable with cuing, good core strength  Postural control: Posterior lean Standing balance support: Bilateral upper extremity supported Standing balance-Leahy Scale: Poor Standing balance comment: Posterior lean in standing requiring min to mod assist, lean is worse when pt's fear of falling is increased. Pt is responsive to corrective cues but does not maintain                             Pertinent Vitals/Pain Pain Assessment: No/denies pain  Home Living Family/patient expects to be discharged to:: Skilled nursing facility Living Arrangements: Alone;Other (Comment) (May live with son, unsure at this time)               Additional Comments: Pt is unreliable historian, claims husband is deceased and that she lives with her 2 sons and has in home care of some kind. Spoke with CSW and  found that pt was discharged to [facility] on 04/23/16 after L hip hemiarthroplasty. CSW reports that pt's son stated she currently lives at home with some level of in-home care    Prior Function Level of Independence: Needs assistance   Gait / Transfers Assistance Needed: Pt reports feeling unsteady even when using RW     Comments: Pt uses RW at home     Hand Dominance        Extremity/Trunk Assessment   Upper Extremity Assessment: Overall WFL for tasks assessed (4/5 with gross assessment )           Lower Extremity Assessment: Overall WFL for tasks assessed (4/5 with gross assessment; deficits more related to balance)      Cervical / Trunk Assessment: Normal  Communication   Communication: HOH (Pt will respond when spoken to, even if she can't hear you)  Cognition Arousal/Alertness: Awake/alert Behavior During Therapy: Anxious (Pleasant and cooperative, but high anxiety about falling) Overall Cognitive Status: History of cognitive impairments - at baseline                      General Comments      Exercises Other Exercises Other Exercises: Balance training including sitting balance with postural cues/demonstration and perturbation in AP and ML directions to address posterior lean. Sit to stand training with postural cuing, "nose over toes" and foot/hand placement X 3 reps; pt could not tolerate perturbation in standing; isometric core strengthening X 2 reps in each direction A,P,M,L.      Assessment/Plan    PT Assessment Patient needs continued PT services  PT Diagnosis Difficulty walking;Abnormality of gait;Generalized weakness;Altered mental status   PT Problem List Decreased strength;Decreased range of motion;Decreased activity tolerance;Decreased balance;Decreased mobility;Decreased coordination;Decreased cognition;Decreased knowledge of use of DME;Decreased safety awareness;Impaired sensation  PT Treatment Interventions DME instruction;Gait  training;Functional mobility training;Therapeutic activities;Therapeutic exercise;Balance training;Neuromuscular re-education   PT Goals (Current goals can be found in the Care Plan section) Acute Rehab PT Goals Patient Stated Goal: go home PT Goal Formulation: With patient Time For Goal Achievement: 08/06/16 Potential to Achieve Goals: Good    Frequency Min 2X/week   Barriers to discharge        Co-evaluation               End of Session Equipment Utilized During Treatment: Gait belt Activity Tolerance: Patient tolerated treatment well Patient left: in chair;with call bell/phone within reach;with chair alarm set Nurse Communication: Mobility status         Time: [id number] PT Time Calculation (min) (ACUTE ONLY): 26 min   Charges:   PT Evaluation $PT Eval Moderate Complexity: 1 Procedure PT Treatments $Neuromuscular Re-education: 8-22 mins   PT G Codes:        Sandra Kaufman 20-Aug-2016, 1:05 PM

## 2016-07-23 NOTE — Progress Notes (Signed)
Clinical Education officer, museum (CSW) presented bed offers to patient's son [name]. Son chose [employer]. Admissions coordinator at [facility] is aware of accepted bed offer and need for IV Abx. CSW will continue to follow and assist as needed.   [employer], LCSW [phone]

## 2016-07-23 NOTE — Clinical Social Work Placement (Signed)
   CLINICAL SOCIAL WORK PLACEMENT  NOTE  Date:  07/23/2016  Patient Details  Name: Sandra Kaufman MRN: [id number] Date of Birth: June 01, 1932  Clinical Social Work is seeking post-discharge placement for this patient at the [place] level of care (*CSW will initial, date and re-position this form in  chart as items are completed):  Yes   Patient/family provided with [place] Work Department's list of facilities offering this level of care within the geographic area requested by the patient (or if unable, by the patient's family).  Yes   Patient/family informed of their freedom to choose among providers that offer the needed level of care, that participate in Medicare, Medicaid or managed care program needed by the patient, have an available bed and are willing to accept the patient.  Yes   Patient/family informed of [place]'s ownership interest in [facility] and [facility], as well as of the fact that they are under no obligation to receive care at these facilities.  PASRR submitted to EDS on       PASRR number received on       Existing PASRR number confirmed on 07/23/16     FL2 transmitted to all facilities in geographic area requested by pt/family on 07/23/16     FL2 transmitted to all facilities within larger geographic area on       Patient informed that his/her managed care company has contracts with or will negotiate with certain facilities, including the following:            Patient/family informed of bed offers received.  Patient chooses bed at       Physician recommends and patient chooses bed at      Patient to be transferred to   on  .  Patient to be transferred to facility by       Patient family notified on   of transfer.  Name of family member notified:        PHYSICIAN       Additional Comment:    _______________________________________________ Ioanna Colquhoun, Veronia Beets, LCSW 07/23/2016, 10:55 AM

## 2016-07-23 NOTE — NC FL2 (Signed)
[place] LEVEL OF CARE SCREENING TOOL     IDENTIFICATION  Patient Name: Sandra Kaufman Birthdate: [birth year] Sex: female Admission Date (Current Location): 07/22/2016  [place] and [place] Number:  Engineering geologist and Address:  [facility], [address] [place], [place] [address]      Provider Number: [id number]  Attending Physician Name and Address:  Loletha Grayer, MD  Relative Name and Phone Number:       Current Level of Care: Hospital Recommended Level of Care: [place] Prior Approval Number:    Date Approved/Denied:   PASRR Number:  ( [id number] )  Discharge Plan: SNF    Current Diagnoses: Patient Active Problem List   Diagnosis Date Noted  . Multiple falls 07/22/2016  . Dementia 04/22/2016  . Fracture of femoral neck, left ([place]) 04/16/2016  . Fracture, femur, neck, left, closed, initial encounter 04/16/2016  . Right lower quadrant pain 03/29/2016  . Vomiting 03/17/2016  . Rash 03/17/2016  . Diarrhea 02/18/2016  . Adult hypothyroidism 01/29/2016  . Fall 01/19/2016  . Anemia 10/20/2015  . Headache, migraine 10/10/2015  . HLD (hyperlipidemia) 10/10/2015  . Glaucoma 10/10/2015  . Chronic obstructive pulmonary disease ([place]) 10/10/2015  . Arthritis 10/10/2015  . Hypertrophic toenail 07/23/2015  . Health care maintenance 05/27/2015  . Difficulty in walking 10/04/2014  . Cerebral ataxia ([place]) 10/04/2014  . Unsteady gait 08/22/2014  . Obesity 08/22/2014  . Dysphagia 07/14/2014  . Amnesia 05/24/2014  . B12 deficiency 05/24/2014  . 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 SOB (shortness of breath) 05/06/2014  . CVA (cerebral vascular accident) ([place]) 03/04/2014  . Diverticulosis 04/29/2013  . GERD (gastroesophageal reflux disease) 11/27/2012  . Urinary incontinence 11/27/2012  . Ovarian cyst 11/27/2012  . Depression 11/27/2012  . Neuropathy ([place]) 11/27/2012  . Hypothyroidism 11/15/2012  .  Hypercholesterolemia 11/15/2012  . Diabetes mellitus ([place]) 11/15/2012  . Hypertension 11/15/2012    Orientation RESPIRATION BLADDER Height & Weight     Self  Normal Incontinent Weight: 166 lb 12 oz (75.6 kg) Height:  5\' 7"  (170.2 cm)  BEHAVIORAL SYMPTOMS/MOOD NEUROLOGICAL BOWEL NUTRITION STATUS   (none )  (none ) Incontinent Diet (Diet: 2 Grams Sodium. )  AMBULATORY STATUS COMMUNICATION OF NEEDS Skin   Extensive Assist Verbally Normal                       Personal Care Assistance Level of Assistance  Bathing, Feeding, Dressing Bathing Assistance: Limited assistance Feeding assistance: Independent Dressing Assistance: Limited assistance     Functional Limitations Info  Sight, Hearing, Speech Sight Info: Adequate Hearing Info: Impaired Speech Info: Adequate    SPECIAL CARE FACTORS FREQUENCY  PT (By licensed PT)     PT Frequency:  (5)              Contractures      Additional Factors Info  Code Status, Allergies, Isolation Precautions Code Status Info:  (Full Code. ) Allergies Info:  (Tramadol )     Isolation Precautions Info:  (ESBL Urine )     Current Medications (07/23/2016):  This is the current hospital active medication list Current Facility-Administered Medications  Medication Dose Route Frequency Provider Last Rate Last Dose  . 0.9 %  sodium chloride infusion   Intravenous Continuous Epifanio Lesches, MD 75 mL/hr at 07/23/16 1007    . acetaminophen (TYLENOL) tablet 650 mg  650 mg Oral Q6H PRN Epifanio Lesches, MD   650 mg at 07/22/16  2116   Or  . acetaminophen (TYLENOL) suppository 650 mg  650 mg Rectal Q6H PRN Epifanio Lesches, MD      . albuterol (PROVENTIL) (2.5 MG/3ML) 0.083% nebulizer solution 2.5 mg  2.5 mg Nebulization Q6H PRN Epifanio Lesches, MD      . amLODipine (NORVASC) tablet 5 mg  5 mg Oral Daily Epifanio Lesches, MD   5 mg at 07/23/16 0818  . aspirin tablet 325 mg  325 mg Oral Daily Epifanio Lesches, MD   325 mg at  07/23/16 0819  . atorvastatin (LIPITOR) tablet 40 mg  40 mg Oral QHS Epifanio Lesches, MD   40 mg at 07/22/16 2117  . bisacodyl (DULCOLAX) EC tablet 5 mg  5 mg Oral Daily PRN Epifanio Lesches, MD      . brinzolamide (AZOPT) 1 % ophthalmic suspension 1 drop  1 drop Both Eyes BID Epifanio Lesches, MD   1 drop at 07/23/16 0820  . docusate sodium (COLACE) capsule 100 mg  100 mg Oral BID Epifanio Lesches, MD   100 mg at 07/23/16 0818  . feeding supplement (ENSURE ENLIVE) (ENSURE ENLIVE) liquid 237 mL  237 mL Oral BID BM Epifanio Lesches, MD   237 mL at 07/23/16 0820  . fluticasone (FLONASE) 50 MCG/ACT nasal spray 2 spray  2 spray Each Nare Daily Epifanio Lesches, MD      . heparin injection 5,000 Units  5,000 Units Subcutaneous Q8H Epifanio Lesches, MD   5,000 Units at 07/23/16 0626  . imipenem-cilastatin (PRIMAXIN) 500 mg in sodium chloride 0.9 % 100 mL IVPB  500 mg Intravenous Q8H Epifanio Lesches, MD   500 mg at 07/23/16 0318  . levothyroxine (SYNTHROID, LEVOTHROID) tablet 100 mcg  100 mcg Oral QAC breakfast Epifanio Lesches, MD   100 mcg at 07/23/16 0819  . memantine (NAMENDA) tablet 10 mg  10 mg Oral BID Epifanio Lesches, MD   10 mg at 07/23/16 0819  . metoprolol tartrate (LOPRESSOR) tablet 25 mg  25 mg Oral BID Epifanio Lesches, MD   25 mg at 07/23/16 0818  . ondansetron (ZOFRAN) tablet 4 mg  4 mg Oral Q6H PRN Epifanio Lesches, MD       Or  . ondansetron (ZOFRAN) injection 4 mg  4 mg Intravenous Q6H PRN Epifanio Lesches, MD      . pantoprazole (PROTONIX) EC tablet 40 mg  40 mg Oral Daily Epifanio Lesches, MD   40 mg at 07/23/16 0819  . psyllium (HYDROCIL/METAMUCIL) packet 0.5 packet  0.5 packet Oral BID Epifanio Lesches, MD   0.5 packet at 07/23/16 0818  . sertraline (ZOLOFT) tablet 100 mg  100 mg Oral Daily Epifanio Lesches, MD   100 mg at 07/23/16 0818  . vitamin B-12 (CYANOCOBALAMIN) tablet 2,000 mcg  2,000 mcg Oral Daily Epifanio Lesches, MD    2,000 mcg at 07/23/16 [id number]     Discharge Medications: Please see discharge summary for a list of discharge medications.  Relevant Imaging Results:  Relevant Lab Results:   Additional Information  (SSN: [id number])  Ether Goebel, Veronia Beets, LCSW

## 2016-07-24 ENCOUNTER — Encounter
Admission: RE | Admit: 2016-07-24 | Discharge: 2016-07-24 | Disposition: A | Discharge status: Home / Self Care | Payer: Medicare Other | Source: Ambulatory Visit | Attending: Internal Medicine | Admitting: Internal Medicine

## 2016-07-24 LAB — VITAMIN B12: Vitamin B-12: 1848 pg/mL — ABNORMAL HIGH (ref 180–914)

## 2016-07-24 LAB — TSH: TSH: 3.331 u[IU]/mL (ref 0.350–4.500)

## 2016-07-24 LAB — URINE CULTURE: Culture: 4000 — AB

## 2016-07-24 LAB — MAGNESIUM: Magnesium: 1.5 mg/dL — ABNORMAL LOW (ref 1.7–2.4)

## 2016-07-24 LAB — POTASSIUM: Potassium: 3.7 mmol/L (ref 3.5–5.1)

## 2016-07-24 MED ORDER — SODIUM CHLORIDE 0.9 % IV SOLN: 1.0000 g | INTRAVENOUS | 0 refills | Status: DC

## 2016-07-24 MED ORDER — ENOXAPARIN SODIUM 40 MG/0.4ML ~~LOC~~ SOLN
40.0000 mg | SUBCUTANEOUS | Status: DC
  Administered 2016-07-24: 40 mg via SUBCUTANEOUS
  Filled 2016-07-24: qty 0.4

## 2016-07-24 MED ORDER — SODIUM CHLORIDE 0.9 % IV SOLN
1.0000 g | INTRAVENOUS | Status: DC
  Administered 2016-07-24: 1 g via INTRAVENOUS
  Filled 2016-07-24 (×3): qty 1

## 2016-07-24 MED ORDER — QUETIAPINE FUMARATE 25 MG PO TABS: 12.5000 mg | ORAL_TABLET | Freq: Every day | ORAL | 0 refills | Status: DC

## 2016-07-24 MED ORDER — MAGNESIUM SULFATE 2 GM/50ML IV SOLN
2.0000 g | Freq: Once | INTRAVENOUS | Status: AC
  Administered 2016-07-24: 2 g via INTRAVENOUS
  Filled 2016-07-24: qty 50

## 2016-07-24 MED ORDER — METOPROLOL TARTRATE 25 MG PO TABS: 25.0000 mg | ORAL_TABLET | Freq: Two times a day (BID) | ORAL | 2 refills | Status: DC

## 2016-07-24 NOTE — Progress Notes (Signed)
PT Cancellation Note  Patient Details Name: Sandra Kaufman MRN: [id number] DOB: [birth year]   Cancelled Treatment:    Reason Eval/Treat Not Completed: Other (comment) (Pt being seen by nurses to fix lines/leads) Treatment attempted, pt was asleep with multiple nurses in the room. Nurses requested PT return at a later time. Will re-attempt, time permitting   Pansie Guggisberg 07/24/2016, 9:01 AM  Burnett Corrente, SPT [phone]

## 2016-07-24 NOTE — Care Management Important Message (Signed)
Important Message  Patient Details  Name: Sandra Kaufman MRN: [id number] Date of Birth: Jun 09, 1932   Medicare Important Message Given:  Yes    Alvie Heidelberg, RN 07/24/2016, 4:05 PM

## 2016-07-24 NOTE — Progress Notes (Signed)
Plan is for patient to D/C to [facility] tomorrow pending medical clearance. Per [facility] admissions coordinator at [facility] patient will go to room 201. RN will call report at [phone]. Clinical Education officer, museum (CSW) sent D/C orders to [place] via [place] today. CSW will continue to follow and assist as needed.   [employer], LCSW [phone]

## 2016-07-24 NOTE — Progress Notes (Signed)
Patient ID: Sandra Kaufman, female   DOB: [birth year], 82 y.o.   MRN: [id number]   [employer] PROGRESS NOTE  Sandra Kaufman [id number] DOB: [birth year] DOA: 07/22/2016 PCP: Einar Pheasant, MD  HPI/Subjective:   Objective: Vitals:   07/24/16 0348 07/24/16 0815  BP: (!) 174/94 136/78  Pulse:  70  Resp:  16  Temp:  (!) 96.6 F (35.9 C)    Filed Weights   07/23/16 0437 07/23/16 0449 07/24/16 0532  Weight: 75.6 kg (166 lb 12 oz) 75.6 kg (166 lb 12 oz) 74.5 kg (164 lb 4.8 oz)    ROS: Review of Systems  Unable to perform ROS: Dementia  Respiratory: Negative for shortness of breath.   Cardiovascular: Negative for chest pain.  Gastrointestinal: Negative for abdominal pain, constipation, diarrhea and vomiting.  Musculoskeletal: Negative for joint pain.  Review of systems Limited with dementia Exam: Physical Exam  HENT:  Nose: No mucosal edema.  Mouth/Throat: No oropharyngeal exudate or posterior oropharyngeal edema.  Eyes: Conjunctivae, EOM and lids are normal. Pupils are equal, round, and reactive to light.  Neck: No JVD present. Carotid bruit is not present. No edema present. No thyroid mass and no thyromegaly present.  Cardiovascular: S1 normal and S2 normal.  Exam reveals no gallop.   No murmur heard. Pulses:      Dorsalis pedis pulses are 2+ on the right side, and 2+ on the left side.  Respiratory: No respiratory distress. She has no wheezes. She has no rhonchi. She has no rales.  GI: Soft. Bowel sounds are normal. There is no tenderness.  Musculoskeletal:       Right ankle: She exhibits no swelling.       Left ankle: She exhibits no swelling.  Lymphadenopathy:    She has no cervical adenopathy.  Neurological: She is alert. No cranial nerve deficit.  Able to straight leg raise bilaterally  Skin: Skin is warm. No rash noted. Nails show no clubbing.  Psychiatric: She has a normal mood and affect.      Data Reviewed: Basic Metabolic Panel:  Recent  Labs Lab 07/22/16 2002 07/22/16 2253 07/23/16 0341 07/24/16 0516  NA  --  138 139  --   K  --  3.1* 3.4* 3.7  CL  --  105 104  --   CO2  --  26 24  --   GLUCOSE  --  109* 133*  --   BUN  --  20 17  --   CREATININE 0.79 0.77 0.66  --   CALCIUM  --  8.9 9.1  --   MG  --   --   --  1.5*   CBC:  Recent Labs Lab 07/22/16 2253 07/23/16 0341  WBC 7.7 7.5  HGB 11.7* 12.2  HCT 35.2 36.6  MCV 87.1 88.5  PLT 203 205    Recent Results (from the past 240 hour(s))  Urine culture     Status: Abnormal   Collection Time: 07/15/16 12:46 PM  Result Value Ref Range Status   Specimen Description URINE, CLEAN CATCH  Final   Special Requests NONE  Final   Culture (A)  Final    >=100,000 COLONIES/mL ESCHERICHIA COLI Confirmed Extended Spectrum Beta-Lactamase Producer (ESBL) Performed at [facility]    Report Status 07/17/2016 FINAL  Final   Organism ID, Bacteria ESCHERICHIA COLI (A)  Final      Susceptibility   Escherichia coli - MIC*    AMPICILLIN >=32 RESISTANT Resistant  CEFAZOLIN >=64 RESISTANT Resistant     CEFTRIAXONE >=64 RESISTANT Resistant     CIPROFLOXACIN >=4 RESISTANT Resistant     GENTAMICIN <=1 SENSITIVE Sensitive     IMIPENEM <=0.25 SENSITIVE Sensitive     NITROFURANTOIN <=16 SENSITIVE Sensitive     TRIMETH/SULFA <=20 SENSITIVE Sensitive     AMPICILLIN/SULBACTAM >=32 RESISTANT Resistant     PIP/TAZO <=4 SENSITIVE Sensitive     Extended ESBL POSITIVE Resistant     * >=100,000 COLONIES/mL ESCHERICHIA COLI     Studies: Dg Chest 1 View  Result Date: 07/23/2016 CLINICAL DATA:  PICC placement. EXAM: CHEST 1 VIEW COMPARISON:  07/22/2016 FINDINGS: PICC has been inserted and the tip is 5 cm below the carina at the cavoatrial junction in good position. Heart size and pulmonary vascularity are normal. Lungs are clear. Prominent hiatal hernia. No acute bone abnormality. IMPRESSION: PICC in good position.  Hiatal hernia. Electronically Signed   By: Lorriane Shire  M.D.   On: 07/23/2016 19:36  Ct Head Wo Contrast  Result Date: 07/23/2016 CLINICAL DATA:  Confusion.  Fall 1 day prior EXAM: CT HEAD WITHOUT CONTRAST TECHNIQUE: Contiguous axial images were obtained from the base of the skull through the vertex without intravenous contrast. COMPARISON:  April 22, 2016 FINDINGS: Brain: Mild diffuse atrophy is stable. There is no intracranial mass, hemorrhage, extra-axial fluid collection, or midline shift. There is patchy small vessel disease in the centra semiovale bilaterally, stable. There is evidence of a prior lacunar infarct in the posterior limb of the right internal capsule near the genu. There is evidence of a prior small infarct in the left posterior thalamus, stable. There is no new gray-white compartment lesion. No acute infarct evident. Vascular: No hyperdense vessel is evident. There are foci of calcification in the distal carotid siphon regions bilaterally. Skull: There is a small right frontal scalp hematoma. The bony calvarium appears intact. Sinuses/Orbits: No orbital lesions are evident. Visualized paranasal sinuses are clear. Other: Mastoid air cells are clear. IMPRESSION: Atrophy with periventricular small vessel disease and prior small infarcts as noted above. No acute infarct evident. No intracranial mass, hemorrhage, or extra-axial fluid collection. Foci of calcification in each distal carotid siphon. Right frontal scalp hematoma. No underlying fracture. Electronically Signed   By: Lowella Grip III M.D.   On: 07/23/2016 11:06   Scheduled Meds: . amLODipine  5 mg Oral Daily  . aspirin  325 mg Oral Daily  . brinzolamide  1 drop Both Eyes BID  . docusate sodium  100 mg Oral BID  . ertapenem  1 g Intravenous Q24H  . feeding supplement (ENSURE ENLIVE)  237 mL Oral BID BM  . fluticasone  2 spray Each Nare Daily  . heparin  5,000 Units Subcutaneous Q8H  . levothyroxine  100 mcg Oral QAC breakfast  . memantine  10 mg Oral BID  . metoprolol  tartrate  25 mg Oral BID  . pantoprazole  40 mg Oral Daily  . psyllium  0.5 packet Oral BID  . QUEtiapine  12.5 mg Oral QHS  . sertraline  100 mg Oral Daily  . vitamin B-12  2,000 mcg Oral Daily    Assessment/Plan:  1. Acute encephalopathy.  Could be secondary to multidrug resistant UTI versus worsening dementia. Patient seems a little bit better today and answers some yes or no questions. Does not confabulate stories like she did yesterday. Yesterday afternoon was very agitated and required medications to stay in the bed. Today I have not gotten any  calls for agitation. 2. Multidrug resistant UTI- IV Invanz through PICC line for another 8 days. 3. Weakness and Frequent falls. Rib fracture 10th rib. Physical therapy recommended rehabilitation. Son will have to decide after rehabilitation on whether to go home with 24 7 care or to the skilled. 4. Hypothyroidism unspecified continue levothyroxine. TSH normal range 5. GERD on PPI 6. Hyperlipidemia on statin  Code Status:     Code Status Orders        Start     Ordered   07/22/16 1602  Full code  Continuous     07/22/16 1604    Code Status History    Date Active Date Inactive Code Status Order ID Comments User Context   07/22/2016  4:05 PM 07/22/2016  4:12 PM Full Code [id number]  Epifanio Lesches, MD Inpatient   04/16/2016  2:05 AM 04/23/2016  7:53 PM DNR [id number]  Lance Coon, MD Inpatient    Advance Directive Documentation   Flowsheet Row Most Recent Value  Type of Advance Directive  Healthcare Power of Attorney  Pre-existing out of facility DNR order (yellow form or pink MOST form)  No data  "MOST" Form in Place?  No data     Family Communication: Son On the phone today Disposition Plan: Likely out to rehabilitation over the weekend  Antibiotics:  IV Invanz  Time spent: 35 minutes. Discharge summary dictated for potential discharge over the weekend.  Loletha Grayer  [employer]

## 2016-07-24 NOTE — Progress Notes (Signed)
Pt confusion seems worse compared admission. She was restless today but easy to redirect. Spent most of the day up in the chair.

## 2016-07-24 NOTE — Discharge Summary (Addendum)
[place] at [place] NAME: Sandra Kaufman    MR#:  [id number]  DATE OF BIRTH:  12-15-1932  DATE OF ADMISSION:  07/22/2016 ADMITTING PHYSICIAN: Epifanio Lesches, MD  DATE OF DISCHARGE: 07/25/2016  PRIMARY CARE PHYSICIAN: Einar Pheasant, MD    ADMISSION DIAGNOSIS:  Frequent falls weakness uti  DISCHARGE DIAGNOSIS:  Active Problems:   Multiple falls   SECONDARY DIAGNOSIS:   Past Medical History:  Diagnosis Date  . Arthritis   . Cancer ([place])    skin  . Colon polyps   . COPD (chronic obstructive pulmonary disease) ([place])   . Diverticulosis   . GERD (gastroesophageal reflux disease)    with esophageal stricture requiring dilatation x 2 (12/96 and 10/99)  . Glaucoma   . History of chicken pox   . Hypercholesterolemia   . Hypertension   . Hypothyroidism   . Migraines   . Peripheral neuropathy ([place])   . Urinary incontinence     HOSPITAL COURSE:   1. Acute encephalopathy. This could be secondary to multidrug resistant UTI versus worsening dementia. MR of the brain cannot be done secondary to stimulator. TSH in the normal range. B12 elevated. RPR still pending. Patient seems better with regards to her answers with Seroquel at night. I will continue that. 2. Multi-drug-resistant UTI with ESBL Escherichia coli. Antibiotics switched to Invanz. The interesting thing is urinalysis that was done yesterday ordered by me looks negative. This could be partially treated urinary tract infection because the patient received a few doses of Primaxin prior to the urine being sent. Urine culture ordered. 10 days of Invanz ordered. PICC line in place. PICC line needs to be removed once last dose of antibiotic infused. PICC line care as per nursing staff. Flush PICC line daily with normal saline 5 cc. 3. Frequent falls. Rib fracture 10th rib. Physical therapy recommended rehabilitation. 4. Hypothyroidism unspecified on levothyroxine and TSH in normal  range 5. Hyperlipidemia unspecified on statin 6. GERD on PPI 7. Essential hypertension continue usual medications 8. Hypomagnesemia this was replaced. 9. Bilateral feet very cold. Patient does have pulses bilaterally and I did Doppler them also.  DISCHARGE CONDITIONS:   Satisfactory  CONSULTS OBTAINED:   none  DRUG ALLERGIES:   Allergies  Allergen Reactions  . Tramadol Nausea And Vomiting    DISCHARGE MEDICATIONS:   Current Discharge Medication List    START taking these medications   Details  ertapenem 1 g in sodium chloride 0.9 % 50 mL Inject 1 g into the vein daily. Qty: 8 Dose, Refills: 0    QUEtiapine (SEROQUEL) 25 MG tablet Take 0.5 tablets (12.5 mg total) by mouth at bedtime. Qty: 30 tablet, Refills: 0      CONTINUE these medications which have CHANGED   Details  metoprolol tartrate (LOPRESSOR) 25 MG tablet Take 1 tablet (25 mg total) by mouth 2 (two) times daily. Qty: 60 tablet, Refills: 2      CONTINUE these medications which have NOT CHANGED   Details  albuterol (PROVENTIL HFA;VENTOLIN HFA) 108 (90 BASE) MCG/ACT inhaler Inhale 2 puffs into the lungs every 6 (six) hours as needed for wheezing or shortness of breath. Qty: 1 Inhaler, Refills: 2    amLODipine (NORVASC) 5 MG tablet Take 1 tablet (5 mg total) by mouth daily. Qty: 30 tablet, Refills: 2    aspirin 325 MG tablet Take 325 mg by mouth daily.    atorvastatin (LIPITOR) 40 MG tablet Take 40 mg by mouth at  bedtime.    betamethasone valerate ointment (VALISONE) 0.1 % Apply 1 application topically 2 (two) times daily. Do not use for more than 2 weeks consecutively. Qty: 30 g, Refills: 0    bimatoprost (LUMIGAN) 0.01 % SOLN Place 1 drop into both eyes at bedtime.    brinzolamide (AZOPT) 1 % ophthalmic suspension Place 1 drop into both eyes 2 (two) times daily.     Coenzyme Q10 (CO Q10) 100 MG CAPS Take 100 mg by mouth daily.     docusate sodium (COLACE) 100 MG capsule Take 1 capsule (100 mg total)  by mouth 2 (two) times daily. Qty: 10 capsule, Refills: 0    feeding supplement, ENSURE ENLIVE, (ENSURE ENLIVE) LIQD Take 237 mLs by mouth 2 (two) times daily between meals. Qty: 237 mL, Refills: 12    fluticasone (FLONASE) 50 MCG/ACT nasal spray Place 2 sprays into both nostrils daily.    levothyroxine (SYNTHROID, LEVOTHROID) 100 MCG tablet Take 100 mcg by mouth daily before breakfast.    memantine (NAMENDA) 10 MG tablet Take 10 mg by mouth 2 (two) times daily.    pantoprazole (PROTONIX) 40 MG tablet Take 1 tablet (40 mg total) by mouth daily. Qty: 30 tablet, Refills: 11    psyllium (METAMUCIL) 0.52 G capsule Take 2.08 g by mouth 2 (two) times daily.     sertraline (ZOLOFT) 100 MG tablet Take 100 mg by mouth daily.    vitamin B-12 (CYANOCOBALAMIN) 1000 MCG tablet Take 2,000 mcg by mouth daily.    triamcinolone cream (KENALOG) 0.1 % Apply 1 application topically 2 (two) times daily as needed (for itching).      STOP taking these medications     cephALEXin (KEFLEX) 500 MG capsule      HYDROcodone-acetaminophen (NORCO/VICODIN) 5-325 MG tablet          DISCHARGE INSTRUCTIONS:   Follow-up Dr. rehabilitation 1 day  If you experience worsening of your admission symptoms, develop shortness of breath, life threatening emergency, suicidal or homicidal thoughts you must seek medical attention immediately by calling 911 or calling your MD immediately  if symptoms less severe.  You Must read complete instructions/literature along with all the possible adverse reactions/side effects for all the Medicines you take and that have been prescribed to you. Take any new Medicines after you have completely understood and accept all the possible adverse reactions/side effects.   Please note  You were cared for by a hospitalist during your hospital stay. If you have any questions about your discharge medications or the care you received while you were in the hospital after you are discharged,  you can call the unit and asked to speak with the hospitalist on call if the hospitalist that took care of you is not available. Once you are discharged, your primary care physician will handle any further medical issues. Please note that NO REFILLS for any discharge medications will be authorized once you are discharged, as it is imperative that you return to your primary care physician (or establish a relationship with a primary care physician if you do not have one) for your aftercare needs so that they can reassess your need for medications and monitor your lab values.    Today   CHIEF COMPLAINT:  No chief complaint on file.   HISTORY OF PRESENT ILLNESS:  Lorianne Terhune  is a 82 y.o. female with a known history of Frequent urinary tract infection presents with worsening mental status and falls   VITAL SIGNS:  Blood pressure 136/78,  pulse 70, temperature (!) 96.6 F (35.9 C), temperature source Oral, resp. rate 16, height 5\' 7"  (1.702 m), weight 74.5 kg (164 lb 4.8 oz), SpO2 96 %.    PHYSICAL EXAMINATION:  GENERAL:  82 y.o.-year-old patient lying in the bed with no acute distress.  EYES: Pupils equal, round, reactive to light and accommodation. No scleral icterus. Extraocular muscles intact.  HEENT: Head atraumatic, normocephalic. Oropharynx and nasopharynx clear.  NECK:  Supple, no jugular venous distention. No thyroid enlargement, no tenderness.  LUNGS: Normal breath sounds bilaterally, no wheezing, rales,rhonchi or crepitation. No use of accessory muscles of respiration.  CARDIOVASCULAR: S1, S2 normal. No murmurs, rubs, or gallops.  ABDOMEN: Soft, non-tender, non-distended. Bowel sounds present. No organomegaly or mass.  EXTREMITIES: No pedal edema, cyanosis, or clubbing.  NEUROLOGIC: Cranial nerves II through XII are intact. Muscle strength 5/5 in all extremities. Sensation intact. Gait not checked.  PSYCHIATRIC: The patient is alert.  SKIN: No obvious rash, lesion, or ulcer.  Bilateral feet and legs very cold.  DATA REVIEW:   CBC  Recent Labs Lab 07/23/16 0341  WBC 7.5  HGB 12.2  HCT 36.6  PLT 205    Chemistries   Recent Labs Lab 07/23/16 0341 07/24/16 0516  NA 139  --   K 3.4* 3.7  CL 104  --   CO2 24  --   GLUCOSE 133*  --   BUN 17  --   CREATININE 0.66  --   CALCIUM 9.1  --   MG  --  1.5*     Microbiology Results  Results for orders placed or performed during the hospital encounter of 07/15/16  Urine culture     Status: Abnormal   Collection Time: 07/15/16 12:46 PM  Result Value Ref Range Status   Specimen Description URINE, CLEAN CATCH  Final   Special Requests NONE  Final   Culture (A)  Final    >=100,000 COLONIES/mL ESCHERICHIA COLI Confirmed Extended Spectrum Beta-Lactamase Producer (ESBL) Performed at [facility]    Report Status 07/17/2016 FINAL  Final   Organism ID, Bacteria ESCHERICHIA COLI (A)  Final      Susceptibility   Escherichia coli - MIC*    AMPICILLIN >=32 RESISTANT Resistant     CEFAZOLIN >=64 RESISTANT Resistant     CEFTRIAXONE >=64 RESISTANT Resistant     CIPROFLOXACIN >=4 RESISTANT Resistant     GENTAMICIN <=1 SENSITIVE Sensitive     IMIPENEM <=0.25 SENSITIVE Sensitive     NITROFURANTOIN <=16 SENSITIVE Sensitive     TRIMETH/SULFA <=20 SENSITIVE Sensitive     AMPICILLIN/SULBACTAM >=32 RESISTANT Resistant     PIP/TAZO <=4 SENSITIVE Sensitive     Extended ESBL POSITIVE Resistant     * >=100,000 COLONIES/mL ESCHERICHIA COLI    RADIOLOGY:  Dg Chest 1 View  Result Date: 07/23/2016 CLINICAL DATA:  PICC placement. EXAM: CHEST 1 VIEW COMPARISON:  07/22/2016 FINDINGS: PICC has been inserted and the tip is 5 cm below the carina at the cavoatrial junction in good position. Heart size and pulmonary vascularity are normal. Lungs are clear. Prominent hiatal hernia. No acute bone abnormality. IMPRESSION: PICC in good position.  Hiatal hernia. Electronically Signed   By: Lorriane Shire M.D.   On: 07/23/2016  19:36  Ct Head Wo Contrast  Result Date: 07/23/2016 CLINICAL DATA:  Confusion.  Fall 1 day prior EXAM: CT HEAD WITHOUT CONTRAST TECHNIQUE: Contiguous axial images were obtained from the base of the skull through the vertex without intravenous contrast. COMPARISON:  April 22, 2016 FINDINGS: Brain: Mild diffuse atrophy is stable. There is no intracranial mass, hemorrhage, extra-axial fluid collection, or midline shift. There is patchy small vessel disease in the centra semiovale bilaterally, stable. There is evidence of a prior lacunar infarct in the posterior limb of the right internal capsule near the genu. There is evidence of a prior small infarct in the left posterior thalamus, stable. There is no new gray-white compartment lesion. No acute infarct evident. Vascular: No hyperdense vessel is evident. There are foci of calcification in the distal carotid siphon regions bilaterally. Skull: There is a small right frontal scalp hematoma. The bony calvarium appears intact. Sinuses/Orbits: No orbital lesions are evident. Visualized paranasal sinuses are clear. Other: Mastoid air cells are clear. IMPRESSION: Atrophy with periventricular small vessel disease and prior small infarcts as noted above. No acute infarct evident. No intracranial mass, hemorrhage, or extra-axial fluid collection. Foci of calcification in each distal carotid siphon. Right frontal scalp hematoma. No underlying fracture. Electronically Signed   By: Lowella Grip III M.D.   On: 07/23/2016 11:06  Management plans discussed with the patient, family and they are in agreement.  CODE STATUS:     Code Status Orders        Start     Ordered   07/22/16 1602  Full code  Continuous     07/22/16 1604    Code Status History    Date Active Date Inactive Code Status Order ID Comments User Context   07/22/2016  4:05 PM 07/22/2016  4:12 PM Full Code [id number]  Epifanio Lesches, MD Inpatient   04/16/2016  2:05 AM 04/23/2016  7:53 PM DNR  [id number]  Lance Coon, MD Inpatient    Advance Directive Documentation   Flowsheet Row Most Recent Value  Type of Advance Directive  Healthcare Power of Attorney  Pre-existing out of facility DNR order (yellow form or pink MOST form)  No data  "MOST" Form in Place?  No data      TOTAL TIME TAKING CARE OF THIS PATIENT: 35 minutes.    Loletha Grayer M.D on 07/24/2016 at 12:16 PM  Between 7am to 6pm - Pager - [phone]  After 6pm go to [email or web address] - password [email or web address] [facility]  [phone]  CC: Primary care physician; Einar Pheasant, MD

## 2016-07-25 DIAGNOSIS — Z9181 History of falling: Secondary | ICD-10-CM | POA: Diagnosis not present

## 2016-07-25 DIAGNOSIS — Z7984 Long term (current) use of oral hypoglycemic drugs: Secondary | ICD-10-CM | POA: Diagnosis not present

## 2016-07-25 DIAGNOSIS — F329 Major depressive disorder, single episode, unspecified: Secondary | ICD-10-CM | POA: Diagnosis not present

## 2016-07-25 DIAGNOSIS — Z452 Encounter for adjustment and management of vascular access device: Secondary | ICD-10-CM | POA: Diagnosis not present

## 2016-07-25 DIAGNOSIS — F418 Other specified anxiety disorders: Secondary | ICD-10-CM | POA: Diagnosis not present

## 2016-07-25 DIAGNOSIS — Z1612 Extended spectrum beta lactamase (ESBL) resistance: Secondary | ICD-10-CM | POA: Diagnosis not present

## 2016-07-25 DIAGNOSIS — E039 Hypothyroidism, unspecified: Secondary | ICD-10-CM | POA: Diagnosis not present

## 2016-07-25 DIAGNOSIS — H409 Unspecified glaucoma: Secondary | ICD-10-CM | POA: Diagnosis not present

## 2016-07-25 DIAGNOSIS — J449 Chronic obstructive pulmonary disease, unspecified: Secondary | ICD-10-CM | POA: Diagnosis not present

## 2016-07-25 DIAGNOSIS — R079 Chest pain, unspecified: Secondary | ICD-10-CM | POA: Diagnosis not present

## 2016-07-25 DIAGNOSIS — R262 Difficulty in walking, not elsewhere classified: Secondary | ICD-10-CM | POA: Diagnosis not present

## 2016-07-25 DIAGNOSIS — Z7401 Bed confinement status: Secondary | ICD-10-CM | POA: Diagnosis not present

## 2016-07-25 DIAGNOSIS — E1139 Type 2 diabetes mellitus with other diabetic ophthalmic complication: Secondary | ICD-10-CM | POA: Diagnosis not present

## 2016-07-25 DIAGNOSIS — I10 Essential (primary) hypertension: Secondary | ICD-10-CM | POA: Diagnosis not present

## 2016-07-25 DIAGNOSIS — F0390 Unspecified dementia without behavioral disturbance: Secondary | ICD-10-CM | POA: Diagnosis not present

## 2016-07-25 DIAGNOSIS — S2239XD Fracture of one rib, unspecified side, subsequent encounter for fracture with routine healing: Secondary | ICD-10-CM | POA: Diagnosis not present

## 2016-07-25 DIAGNOSIS — F419 Anxiety disorder, unspecified: Secondary | ICD-10-CM | POA: Diagnosis not present

## 2016-07-25 DIAGNOSIS — R1110 Vomiting, unspecified: Secondary | ICD-10-CM | POA: Diagnosis not present

## 2016-07-25 DIAGNOSIS — R531 Weakness: Secondary | ICD-10-CM | POA: Diagnosis not present

## 2016-07-25 DIAGNOSIS — R296 Repeated falls: Secondary | ICD-10-CM | POA: Diagnosis not present

## 2016-07-25 DIAGNOSIS — B9689 Other specified bacterial agents as the cause of diseases classified elsewhere: Secondary | ICD-10-CM | POA: Diagnosis not present

## 2016-07-25 DIAGNOSIS — N390 Urinary tract infection, site not specified: Secondary | ICD-10-CM | POA: Diagnosis not present

## 2016-07-25 DIAGNOSIS — E7800 Pure hypercholesterolemia, unspecified: Secondary | ICD-10-CM | POA: Diagnosis not present

## 2016-07-25 DIAGNOSIS — R1111 Vomiting without nausea: Secondary | ICD-10-CM | POA: Diagnosis not present

## 2016-07-25 DIAGNOSIS — E1140 Type 2 diabetes mellitus with diabetic neuropathy, unspecified: Secondary | ICD-10-CM | POA: Diagnosis not present

## 2016-07-25 DIAGNOSIS — B9620 Unspecified Escherichia coli [E. coli] as the cause of diseases classified elsewhere: Secondary | ICD-10-CM | POA: Diagnosis not present

## 2016-07-25 DIAGNOSIS — G9340 Encephalopathy, unspecified: Secondary | ICD-10-CM | POA: Diagnosis not present

## 2016-07-25 DIAGNOSIS — G4700 Insomnia, unspecified: Secondary | ICD-10-CM | POA: Diagnosis not present

## 2016-07-25 DIAGNOSIS — R4789 Other speech disturbances: Secondary | ICD-10-CM | POA: Diagnosis not present

## 2016-07-25 DIAGNOSIS — K567 Ileus, unspecified: Secondary | ICD-10-CM | POA: Diagnosis not present

## 2016-07-25 DIAGNOSIS — F0281 Dementia in other diseases classified elsewhere with behavioral disturbance: Secondary | ICD-10-CM | POA: Diagnosis not present

## 2016-07-25 DIAGNOSIS — F0391 Unspecified dementia with behavioral disturbance: Secondary | ICD-10-CM | POA: Diagnosis not present

## 2016-07-25 DIAGNOSIS — M6281 Muscle weakness (generalized): Secondary | ICD-10-CM | POA: Diagnosis not present

## 2016-07-25 DIAGNOSIS — K219 Gastro-esophageal reflux disease without esophagitis: Secondary | ICD-10-CM | POA: Diagnosis not present

## 2016-07-25 LAB — RPR: RPR Ser Ql: NONREACTIVE

## 2016-07-25 LAB — GLUCOSE, CAPILLARY: Glucose-Capillary: 132 mg/dL — ABNORMAL HIGH (ref 65–99)

## 2016-07-25 NOTE — Clinical Social Work Note (Signed)
Patient discharging to [facility] today as planned. Patient is not oriented. CSW contacted patient's son, [name], and he is aware. Patient to transport via EMS. Discharge information sent yesterday by previous CSW.  Shela Leff MSW,LCSW [phone]

## 2016-07-25 NOTE — Progress Notes (Signed)
Called report to [facility], report to Janeann Merl, RN, with verbal understanding. EMS called for transport. VSS at the this, going to rehab with PICC line for ABT.

## 2016-07-25 NOTE — Discharge Summary (Signed)
[place] at [place] NAME: Sandra Kaufman    MR#:  [id number]  DATE OF BIRTH:  April 12, 1932  DATE OF ADMISSION:  07/22/2016 ADMITTING PHYSICIAN: Epifanio Lesches, MD  DATE OF DISCHARGE: 07/25/16  PRIMARY CARE PHYSICIAN: Einar Pheasant, MD    ADMISSION DIAGNOSIS:  Frequent falls weakness uti  DISCHARGE DIAGNOSIS:  Active Problems:   Multiple falls   SECONDARY DIAGNOSIS:   Past Medical History:  Diagnosis Date  . Arthritis   . Cancer ([place])    skin  . Colon polyps   . COPD (chronic obstructive pulmonary disease) ([place])   . Diverticulosis   . GERD (gastroesophageal reflux disease)    with esophageal stricture requiring dilatation x 2 (12/96 and 10/99)  . Glaucoma   . History of chicken pox   . Hypercholesterolemia   . Hypertension   . Hypothyroidism   . Migraines   . Peripheral neuropathy ([place])   . Urinary incontinence     HOSPITAL COURSE:  Sandra Kaufman  is a 82 y.o. female admitted 07/22/2016 with chief complaint No chief complaint on file. Marland Kitchen Please see H&P performed by Epifanio Lesches, MD for further information. Patient presented after multiple falls and worsening mental status. Found to have UTI - culture data returned ESBL Ecoli. PICC placed and patient on ertapenem.    1. Acute encephalopathy. This could be secondary to multidrug resistant UTI versus worsening dementia.  2. Multi-drug-resistant UTI with ESBL Escherichia coli.  10 days of Invanz ordered.  3. Frequent falls. Rib fracture 10th rib. Physical therapy recommended rehabilitation. 4. Hypothyroidism unspecified on levothyroxine and TSH in normal range 5. Hyperlipidemia unspecified on statin 6. GERD on PPI 7. Essential hypertension continue usual medications 8. Hypomagnesemia this was replaced.   DISCHARGE CONDITIONS:   stable  CONSULTS OBTAINED:    DRUG ALLERGIES:   Allergies  Allergen Reactions  . Tramadol Nausea And Vomiting     DISCHARGE MEDICATIONS:   Current Discharge Medication List    START taking these medications   Details  ertapenem 1 g in sodium chloride 0.9 % 50 mL Inject 1 g into the vein daily. Qty: 8 Dose, Refills: 0    QUEtiapine (SEROQUEL) 25 MG tablet Take 0.5 tablets (12.5 mg total) by mouth at bedtime. Qty: 30 tablet, Refills: 0      CONTINUE these medications which have CHANGED   Details  metoprolol tartrate (LOPRESSOR) 25 MG tablet Take 1 tablet (25 mg total) by mouth 2 (two) times daily. Qty: 60 tablet, Refills: 2      CONTINUE these medications which have NOT CHANGED   Details  albuterol (PROVENTIL HFA;VENTOLIN HFA) 108 (90 BASE) MCG/ACT inhaler Inhale 2 puffs into the lungs every 6 (six) hours as needed for wheezing or shortness of breath. Qty: 1 Inhaler, Refills: 2    amLODipine (NORVASC) 5 MG tablet Take 1 tablet (5 mg total) by mouth daily. Qty: 30 tablet, Refills: 2    aspirin 325 MG tablet Take 325 mg by mouth daily.    atorvastatin (LIPITOR) 40 MG tablet Take 40 mg by mouth at bedtime.    betamethasone valerate ointment (VALISONE) 0.1 % Apply 1 application topically 2 (two) times daily. Do not use for more than 2 weeks consecutively. Qty: 30 g, Refills: 0    bimatoprost (LUMIGAN) 0.01 % SOLN Place 1 drop into both eyes at bedtime.    brinzolamide (AZOPT) 1 % ophthalmic suspension Place 1 drop into both eyes 2 (two) times daily.  Coenzyme Q10 (CO Q10) 100 MG CAPS Take 100 mg by mouth daily.     docusate sodium (COLACE) 100 MG capsule Take 1 capsule (100 mg total) by mouth 2 (two) times daily. Qty: 10 capsule, Refills: 0    feeding supplement, ENSURE ENLIVE, (ENSURE ENLIVE) LIQD Take 237 mLs by mouth 2 (two) times daily between meals. Qty: 237 mL, Refills: 12    fluticasone (FLONASE) 50 MCG/ACT nasal spray Place 2 sprays into both nostrils daily.    levothyroxine (SYNTHROID, LEVOTHROID) 100 MCG tablet Take 100 mcg by mouth daily before breakfast.     memantine (NAMENDA) 10 MG tablet Take 10 mg by mouth 2 (two) times daily.    pantoprazole (PROTONIX) 40 MG tablet Take 1 tablet (40 mg total) by mouth daily. Qty: 30 tablet, Refills: 11    psyllium (METAMUCIL) 0.52 G capsule Take 2.08 g by mouth 2 (two) times daily.     sertraline (ZOLOFT) 100 MG tablet Take 100 mg by mouth daily.    vitamin B-12 (CYANOCOBALAMIN) 1000 MCG tablet Take 2,000 mcg by mouth daily.    triamcinolone cream (KENALOG) 0.1 % Apply 1 application topically 2 (two) times daily as needed (for itching).      STOP taking these medications     cephALEXin (KEFLEX) 500 MG capsule      HYDROcodone-acetaminophen (NORCO/VICODIN) 5-325 MG tablet          DISCHARGE INSTRUCTIONS:    10 days of Invanz ordered. PICC line in place. PICC line needs to be removed once last dose of antibiotic infused. PICC line care as per nursing staff   DIET:  Regular diet  DISCHARGE CONDITION:  Stable  ACTIVITY:  Activity as tolerated  OXYGEN:  Home Oxygen: No.   Oxygen Delivery: room air  DISCHARGE LOCATION:  nursing home   If you experience worsening of your admission symptoms, develop shortness of breath, life threatening emergency, suicidal or homicidal thoughts you must seek medical attention immediately by calling 911 or calling your MD immediately  if symptoms less severe.  You Must read complete instructions/literature along with all the possible adverse reactions/side effects for all the Medicines you take and that have been prescribed to you. Take any new Medicines after you have completely understood and accpet all the possible adverse reactions/side effects.   Please note  You were cared for by a hospitalist during your hospital stay. If you have any questions about your discharge medications or the care you received while you were in the hospital after you are discharged, you can call the unit and asked to speak with the hospitalist on call if the hospitalist  that took care of you is not available. Once you are discharged, your primary care physician will handle any further medical issues. Please note that NO REFILLS for any discharge medications will be authorized once you are discharged, as it is imperative that you return to your primary care physician (or establish a relationship with a primary care physician if you do not have one) for your aftercare needs so that they can reassess your need for medications and monitor your lab values.    On the day of Discharge:   VITAL SIGNS:  Blood pressure (!) 148/84, pulse 68, temperature 97.1 F (36.2 C), temperature source Oral, resp. rate 17, height 5\' 7"  (1.702 m), weight 163 lb 9.6 oz (74.2 kg), SpO2 97 %.  I/O:   Intake/Output Summary (Last 24 hours) at 07/25/16 0941 Last data filed at 07/25/16 0800  Gross per 24 hour  Intake              410 ml  Output                0 ml  Net              410 ml    PHYSICAL EXAMINATION:  GENERAL:  82 y.o.-year-old patient lying in the bed with no acute distress.  EYES: Pupils equal, round, reactive to light and accommodation. No scleral icterus. Extraocular muscles intact.  HEENT: Head atraumatic, normocephalic. Oropharynx and nasopharynx clear.  NECK:  Supple, no jugular venous distention. No thyroid enlargement, no tenderness.  LUNGS: Normal breath sounds bilaterally, no wheezing, rales,rhonchi or crepitation. No use of accessory muscles of respiration.  CARDIOVASCULAR: S1, S2 normal. No murmurs, rubs, or gallops.  ABDOMEN: Soft, non-tender, non-distended. Bowel sounds present. No organomegaly or mass.  EXTREMITIES: No pedal edema, cyanosis, or clubbing.  NEUROLOGIC: Cranial nerves II through XII are intact. Muscle strength 5/5 in all extremities. Sensation intact. Gait not checked.  PSYCHIATRIC: The patient is alert and oriented x 2.  SKIN: No obvious rash, lesion, or ulcer.   DATA REVIEW:   CBC  Recent Labs Lab 07/23/16 0341  WBC 7.5  HGB  12.2  HCT 36.6  PLT 205    Chemistries   Recent Labs Lab 07/23/16 0341 07/24/16 0516  NA 139  --   K 3.4* 3.7  CL 104  --   CO2 24  --   GLUCOSE 133*  --   BUN 17  --   CREATININE 0.66  --   CALCIUM 9.1  --   MG  --  1.5*    Cardiac Enzymes No results for input(s): TROPONINI in the last 168 hours.  Microbiology Results  Results for orders placed or performed during the hospital encounter of 07/22/16  Urine culture     Status: Abnormal   Collection Time: 07/23/16  3:52 PM  Result Value Ref Range Status   Specimen Description URINE, RANDOM  Final   Special Requests NONE  Final   Culture (A)  Final    4,000 COLONIES/mL INSIGNIFICANT GROWTH Performed at [facility]    Report Status 07/24/2016 FINAL  Final    RADIOLOGY:  Dg Chest 1 View  Result Date: 07/23/2016 CLINICAL DATA:  PICC placement. EXAM: CHEST 1 VIEW COMPARISON:  07/22/2016 FINDINGS: PICC has been inserted and the tip is 5 cm below the carina at the cavoatrial junction in good position. Heart size and pulmonary vascularity are normal. Lungs are clear. Prominent hiatal hernia. No acute bone abnormality. IMPRESSION: PICC in good position.  Hiatal hernia. Electronically Signed   By: Lorriane Shire M.D.   On: 07/23/2016 19:36  Ct Head Wo Contrast  Result Date: 07/23/2016 CLINICAL DATA:  Confusion.  Fall 1 day prior EXAM: CT HEAD WITHOUT CONTRAST TECHNIQUE: Contiguous axial images were obtained from the base of the skull through the vertex without intravenous contrast. COMPARISON:  April 22, 2016 FINDINGS: Brain: Mild diffuse atrophy is stable. There is no intracranial mass, hemorrhage, extra-axial fluid collection, or midline shift. There is patchy small vessel disease in the centra semiovale bilaterally, stable. There is evidence of a prior lacunar infarct in the posterior limb of the right internal capsule near the genu. There is evidence of a prior small infarct in the left posterior thalamus, stable. There  is no new gray-white compartment lesion. No acute infarct evident. Vascular: No hyperdense vessel is  evident. There are foci of calcification in the distal carotid siphon regions bilaterally. Skull: There is a small right frontal scalp hematoma. The bony calvarium appears intact. Sinuses/Orbits: No orbital lesions are evident. Visualized paranasal sinuses are clear. Other: Mastoid air cells are clear. IMPRESSION: Atrophy with periventricular small vessel disease and prior small infarcts as noted above. No acute infarct evident. No intracranial mass, hemorrhage, or extra-axial fluid collection. Foci of calcification in each distal carotid siphon. Right frontal scalp hematoma. No underlying fracture. Electronically Signed   By: Lowella Grip III M.D.   On: 07/23/2016 11:06    Management plans discussed with the patient, family and they are in agreement.  CODE STATUS:     Code Status Orders        Start     Ordered   07/22/16 1602  Full code  Continuous     07/22/16 1604    Code Status History    Date Active Date Inactive Code Status Order ID Comments User Context   07/22/2016  4:05 PM 07/22/2016  4:12 PM Full Code [id number]  Epifanio Lesches, MD Inpatient   04/16/2016  2:05 AM 04/23/2016  7:53 PM DNR [id number]  Lance Coon, MD Inpatient    Advance Directive Documentation   Flowsheet Row Most Recent Value  Type of Advance Directive  Healthcare Power of Attorney  Pre-existing out of facility DNR order (yellow form or pink MOST form)  No data  "MOST" Form in Place?  No data      TOTAL TIME TAKING CARE OF THIS PATIENT: 33 minutes.    Mohanad Carsten,  Karenann Cai.D on 07/25/2016 at 9:41 AM  Between 7am to 6pm - Pager - [phone]  After 6pm go to [email or web address] - Proofreader  [facility]  Office  [phone]  CC: Primary care physician; Einar Pheasant, MD

## 2016-07-25 NOTE — Clinical Social Work Placement (Signed)
   CLINICAL SOCIAL WORK PLACEMENT  NOTE  Date:  07/25/2016  Patient Details  Name: Sandra Kaufman MRN: [id number] Date of Birth: 03-01-1932  Clinical Social Work is seeking post-discharge placement for this patient at the [place] level of care (*CSW will initial, date and re-position this form in  chart as items are completed):  Yes   Patient/family provided with [place] Work Department's list of facilities offering this level of care within the geographic area requested by the patient (or if unable, by the patient's family).  Yes   Patient/family informed of their freedom to choose among providers that offer the needed level of care, that participate in Medicare, Medicaid or managed care program needed by the patient, have an available bed and are willing to accept the patient.  Yes   Patient/family informed of [facility]'s ownership interest in [facility] and [facility], as well as of the fact that they are under no obligation to receive care at these facilities.  PASRR submitted to EDS on       PASRR number received on       Existing PASRR number confirmed on 07/23/16     FL2 transmitted to all facilities in geographic area requested by pt/family on 07/23/16     FL2 transmitted to all facilities within larger geographic area on       Patient informed that his/her managed care company has contracts with or will negotiate with certain facilities, including the following:        Yes   Patient/family informed of bed offers received.  Patient chooses bed at  [facility])     Physician recommends and patient chooses bed at  [facility])    Patient to be transferred to  [facility]) on 07/25/16.  Patient to be transferred to facility by  (EMS)     Patient family notified on 07/25/16 of transfer.  Name of family member notified:   (Patient's son, [name])     PHYSICIAN       Additional Comment:     _______________________________________________ Shela Leff, LCSW 07/25/2016, 10:07 AM

## 2016-07-27 DIAGNOSIS — F0390 Unspecified dementia without behavioral disturbance: Secondary | ICD-10-CM | POA: Diagnosis not present

## 2016-07-27 DIAGNOSIS — R079 Chest pain, unspecified: Secondary | ICD-10-CM | POA: Diagnosis not present

## 2016-07-27 DIAGNOSIS — R296 Repeated falls: Secondary | ICD-10-CM | POA: Diagnosis not present

## 2016-07-27 DIAGNOSIS — G9340 Encephalopathy, unspecified: Secondary | ICD-10-CM | POA: Diagnosis not present

## 2016-07-27 DIAGNOSIS — N390 Urinary tract infection, site not specified: Secondary | ICD-10-CM | POA: Diagnosis not present

## 2016-07-28 ENCOUNTER — Encounter
Admission: RE | Admit: 2016-07-28 | Discharge: 2016-07-28 | Disposition: A | Discharge status: Home / Self Care | Payer: Medicare Other | Source: Ambulatory Visit | Attending: Internal Medicine | Admitting: Internal Medicine

## 2016-07-28 LAB — GLUCOSE, CAPILLARY: Glucose-Capillary: 196 mg/dL — ABNORMAL HIGH (ref 65–99)

## 2016-07-29 ENCOUNTER — Ambulatory Visit: Payer: Self-pay | Admitting: Urology

## 2016-07-29 ENCOUNTER — Encounter: Payer: Self-pay | Admitting: Urology

## 2016-07-30 LAB — GLUCOSE, CAPILLARY: Glucose-Capillary: 143 mg/dL — ABNORMAL HIGH (ref 65–99)

## 2016-07-31 LAB — GLUCOSE, CAPILLARY
Glucose-Capillary: 145 mg/dL — ABNORMAL HIGH (ref 65–99)
Glucose-Capillary: 167 mg/dL — ABNORMAL HIGH (ref 65–99)

## 2016-08-01 LAB — GLUCOSE, CAPILLARY
Glucose-Capillary: 138 mg/dL — ABNORMAL HIGH (ref 65–99)
Glucose-Capillary: 141 mg/dL — ABNORMAL HIGH (ref 65–99)
Glucose-Capillary: 148 mg/dL — ABNORMAL HIGH (ref 65–99)

## 2016-08-02 LAB — GLUCOSE, CAPILLARY
Glucose-Capillary: 118 mg/dL — ABNORMAL HIGH (ref 65–99)
Glucose-Capillary: 133 mg/dL — ABNORMAL HIGH (ref 65–99)

## 2016-08-03 LAB — GLUCOSE, CAPILLARY: Glucose-Capillary: 140 mg/dL — ABNORMAL HIGH (ref 65–99)

## 2016-08-04 LAB — URINALYSIS COMPLETE WITH MICROSCOPIC (ARMC ONLY)
Bacteria, UA: NONE SEEN
Bilirubin Urine: NEGATIVE
Glucose, UA: NEGATIVE mg/dL
Hgb urine dipstick: NEGATIVE
Ketones, ur: NEGATIVE mg/dL
Leukocytes, UA: NEGATIVE
Nitrite: NEGATIVE
Protein, ur: NEGATIVE mg/dL
Specific Gravity, Urine: 1.019 (ref 1.005–1.030)
Squamous Epithelial / LPF: NONE SEEN
pH: 5 (ref 5.0–8.0)

## 2016-08-05 LAB — GLUCOSE, CAPILLARY
Glucose-Capillary: 153 mg/dL — ABNORMAL HIGH (ref 65–99)
Glucose-Capillary: 187 mg/dL — ABNORMAL HIGH (ref 65–99)
Glucose-Capillary: 151 mg/dL — ABNORMAL HIGH (ref 65–99)
Glucose-Capillary: 171 mg/dL — ABNORMAL HIGH (ref 65–99)

## 2016-08-05 LAB — URINE CULTURE: Culture: NO GROWTH

## 2016-08-07 LAB — GLUCOSE, CAPILLARY
Glucose-Capillary: 126 mg/dL — ABNORMAL HIGH (ref 65–99)
Glucose-Capillary: 167 mg/dL — ABNORMAL HIGH (ref 65–99)
Glucose-Capillary: 209 mg/dL — ABNORMAL HIGH (ref 65–99)
Glucose-Capillary: 216 mg/dL — ABNORMAL HIGH (ref 65–99)
Glucose-Capillary: 122 mg/dL — ABNORMAL HIGH (ref 65–99)

## 2016-08-09 LAB — GLUCOSE, CAPILLARY
Glucose-Capillary: 142 mg/dL — ABNORMAL HIGH (ref 65–99)
Glucose-Capillary: 173 mg/dL — ABNORMAL HIGH (ref 65–99)
Glucose-Capillary: 165 mg/dL — ABNORMAL HIGH (ref 65–99)
Glucose-Capillary: 170 mg/dL — ABNORMAL HIGH (ref 65–99)

## 2016-08-11 LAB — GLUCOSE, CAPILLARY
Glucose-Capillary: 152 mg/dL — ABNORMAL HIGH (ref 65–99)
Glucose-Capillary: 146 mg/dL — ABNORMAL HIGH (ref 65–99)
Glucose-Capillary: 137 mg/dL — ABNORMAL HIGH (ref 65–99)
Glucose-Capillary: 176 mg/dL — ABNORMAL HIGH (ref 65–99)

## 2016-08-13 ENCOUNTER — Non-Acute Institutional Stay (SKILLED_NURSING_FACILITY): Payer: Medicare Other | Admitting: Gerontology

## 2016-08-13 DIAGNOSIS — N390 Urinary tract infection, site not specified: Secondary | ICD-10-CM | POA: Diagnosis not present

## 2016-08-13 DIAGNOSIS — B9689 Other specified bacterial agents as the cause of diseases classified elsewhere: Secondary | ICD-10-CM

## 2016-08-13 DIAGNOSIS — R296 Repeated falls: Secondary | ICD-10-CM | POA: Diagnosis not present

## 2016-08-13 LAB — GLUCOSE, CAPILLARY
Glucose-Capillary: 154 mg/dL — ABNORMAL HIGH (ref 65–99)
Glucose-Capillary: 152 mg/dL — ABNORMAL HIGH (ref 65–99)
Glucose-Capillary: 144 mg/dL — ABNORMAL HIGH (ref 65–99)
Glucose-Capillary: 191 mg/dL — ABNORMAL HIGH (ref 65–99)

## 2016-08-14 LAB — GLUCOSE, CAPILLARY
Glucose-Capillary: 155 mg/dL — ABNORMAL HIGH (ref 65–99)
Glucose-Capillary: 129 mg/dL — ABNORMAL HIGH (ref 65–99)

## 2016-08-15 LAB — GLUCOSE, CAPILLARY
Glucose-Capillary: 144 mg/dL — ABNORMAL HIGH (ref 65–99)
Glucose-Capillary: 172 mg/dL — ABNORMAL HIGH (ref 65–99)

## 2016-08-16 LAB — GLUCOSE, CAPILLARY
Glucose-Capillary: 144 mg/dL — ABNORMAL HIGH (ref 65–99)
Glucose-Capillary: 198 mg/dL — ABNORMAL HIGH (ref 65–99)

## 2016-08-16 NOTE — Progress Notes (Signed)
Location:      Place of Service:  SNF (31) Provider:  Toni Arthurs, NP-C  Einar Pheasant, MD  Patient Care Team: Einar Pheasant, MD as PCP - General (Internal Medicine)  Extended Emergency Contact Information Primary Emergency Contact: Gandy of [place] Phone: [phone] Relation: Son Secondary Emergency Contact: Flanigan,Chuck Address: [facility]          [place], [place] [address] [address] of [place] Phone: [phone] Mobile Phone: [phone] Relation: Son  Code Status:  dnr Goals of care: Advanced Directive information Advanced Directives 07/22/2016  Does patient have an advance directive? Yes  Type of Advance Directive [place]  Does patient want to make changes to advanced directive? No - Patient declined  Copy of advanced directive(s) in chart? No - copy requested  Would patient like information on creating an advanced directive? No - patient declined information     Chief Complaint  Patient presents with  . Follow-up    HPI:  Pt is a 82 y.o. female seen today for follow up following admission for ESBL UTI receiving IV abt via PICC line. She is also at the facility for rehab for weakness, multiple falls. She has been working with physical and occupational therapies. Says pain is well controlled. Appetite good. VSS. No other c/o.    Past Medical History:  Diagnosis Date  . Arthritis   . Cancer ([place])    skin  . Colon polyps   . COPD (chronic obstructive pulmonary disease) ([place])   . Diverticulosis   . GERD (gastroesophageal reflux disease)    with esophageal stricture requiring dilatation x 2 (12/96 and 10/99)  . Glaucoma   . History of chicken pox   . Hypercholesterolemia   . Hypertension   . Hypothyroidism   . Migraines   . Peripheral neuropathy ([place])   . Urinary incontinence    Past Surgical History:  Procedure Laterality Date  . ABDOMINAL HYSTERECTOMY  1961   ovaries not removed  .  APPENDECTOMY    . bladder tack  1976  . CATARACT EXTRACTION  1997   bilateral  . CHOLECYSTECTOMY    . HIP ARTHROPLASTY Left 04/16/2016   Procedure: ARTHROPLASTY BIPOLAR HIP (HEMIARTHROPLASTY);  Surgeon: Corky Mull, MD;  Location: [facility] ORS;  Service: Orthopedics;  Laterality: Left;  . INTERSTIM IMPLANT PLACEMENT  2011  . TONSILLECTOMY      Allergies  Allergen Reactions  . Tramadol Nausea And Vomiting      Medication List       Accurate as of 08/13/16 11:59 PM. Always use your most recent med list.          albuterol 108 (90 Base) MCG/ACT inhaler Commonly known as:  PROVENTIL HFA;VENTOLIN HFA Inhale 2 puffs into the lungs every 6 (six) hours as needed for wheezing or shortness of breath.   amLODipine 5 MG tablet Commonly known as:  NORVASC Take 1 tablet (5 mg total) by mouth daily.   aspirin 325 MG tablet Take 325 mg by mouth daily.   atorvastatin 40 MG tablet Commonly known as:  LIPITOR Take 40 mg by mouth at bedtime.   betamethasone valerate ointment 0.1 % Commonly known as:  VALISONE Apply 1 application topically 2 (two) times daily. Do not use for more than 2 weeks consecutively.   bimatoprost 0.01 % Soln Commonly known as:  LUMIGAN Place 1 drop into both eyes at bedtime.   brinzolamide 1 % ophthalmic suspension Commonly known as:  AZOPT Place  1 drop into both eyes 2 (two) times daily.   Co Q10 100 MG Caps Take 100 mg by mouth daily.   docusate sodium 100 MG capsule Commonly known as:  COLACE Take 1 capsule (100 mg total) by mouth 2 (two) times daily.   ertapenem 1 g in sodium chloride 0.9 % 50 mL Inject 1 g into the vein daily.   feeding supplement (ENSURE ENLIVE) Liqd Take 237 mLs by mouth 2 (two) times daily between meals.   fluticasone 50 MCG/ACT nasal spray Commonly known as:  FLONASE Place 2 sprays into both nostrils daily.   haloperidol 2 MG tablet Commonly known as:  HALDOL Take 2 mg by mouth 3 (three) times daily as needed.     HYDROcodone-acetaminophen 5-325 MG tablet Commonly known as:  NORCO/VICODIN   levothyroxine 100 MCG tablet Commonly known as:  SYNTHROID, LEVOTHROID Take 100 mcg by mouth daily before breakfast.   memantine 10 MG tablet Commonly known as:  NAMENDA Take 10 mg by mouth 2 (two) times daily.   METAMUCIL 0.52 g capsule Generic drug:  psyllium Take 2.08 g by mouth 2 (two) times daily.   metoprolol tartrate 25 MG tablet Commonly known as:  LOPRESSOR Take 1 tablet (25 mg total) by mouth 2 (two) times daily.   pantoprazole 40 MG tablet Commonly known as:  PROTONIX Take 1 tablet (40 mg total) by mouth daily.   QUEtiapine 25 MG tablet Commonly known as:  SEROQUEL Take 0.5 tablets (12.5 mg total) by mouth at bedtime.   sertraline 100 MG tablet Commonly known as:  ZOLOFT Take 100 mg by mouth daily.   triamcinolone cream 0.1 % Commonly known as:  KENALOG Apply 1 application topically 2 (two) times daily as needed (for itching).   vitamin B-12 1000 MCG tablet Commonly known as:  CYANOCOBALAMIN Take 2,000 mcg by mouth daily.       Review of Systems  Constitutional: Negative for activity change, appetite change, chills, diaphoresis and fever.  HENT: Negative for congestion, sneezing, sore throat, trouble swallowing and voice change.   Eyes: Negative for pain, redness and visual disturbance.  Respiratory: Negative for apnea, cough, choking, chest tightness, shortness of breath and wheezing.   Cardiovascular: Negative for chest pain, palpitations and leg swelling.  Gastrointestinal: Negative for abdominal distention, abdominal pain, constipation, diarrhea and nausea.  Genitourinary: Negative for difficulty urinating, dysuria, frequency and urgency.  Musculoskeletal: Negative for back pain, gait problem and myalgias. Arthralgias: typical arthritis.  Skin: Negative for color change, pallor, rash and wound.  Neurological: Negative for dizziness, tremors, syncope, speech difficulty,  weakness, numbness and headaches.  Psychiatric/Behavioral: Negative for agitation and behavioral problems.  All other systems reviewed and are negative.   Immunization History  Administered Date(s) Administered  . Influenza Split 10/28/2013   Pertinent  Health Maintenance Due  Topic Date Due  . FOOT EXAM  05/20/1942  . OPHTHALMOLOGY EXAM  05/20/1942  . DEXA SCAN  05/21/1999  . PNA vac Low Risk Adult (1 of 2 - PCV13) 05/21/1999  . INFLUENZA VACCINE  07/28/2016  . URINE MICROALBUMIN  09/17/2016  . HEMOGLOBIN A1C  10/16/2016  . MAMMOGRAM  02/26/2017   Fall Risk  07/04/2015 05/16/2015 03/26/2014 01/06/2013 11/27/2012  Falls in the past year? No No Yes Yes No  Number falls in past yr: - - 1 1 -  Injury with Fall? - - - No -  Risk for fall due to : - - History of fall(s);Impaired mobility - -   Functional  Status Survey:    Vitals:   08/13/16 0500  BP: 128/64  Pulse: 65  Resp: 20  Temp: 97.4 F (36.3 C)  SpO2: 99%  Weight: 165 lb (74.8 kg)   Body mass index is 25.84 kg/m. Physical Exam  Constitutional: She is oriented to person, place, and time. Vital signs are normal. She appears well-developed and well-nourished. She is active and cooperative. She does not appear ill. No distress.  HENT:  Head: Normocephalic and atraumatic.  Mouth/Throat: Uvula is midline, oropharynx is clear and moist and mucous membranes are normal. Mucous membranes are not pale, not dry and not cyanotic.  Eyes: Conjunctivae, EOM and lids are normal. Pupils are equal, round, and reactive to light.  Neck: Trachea normal, normal range of motion and full passive range of motion without pain. Neck supple. No JVD present. No tracheal deviation, no edema and no erythema present. No thyromegaly present.  Cardiovascular: Normal rate, regular rhythm, intact distal pulses and normal pulses.  Exam reveals no gallop, no distant heart sounds and no friction rub.   No murmur heard. Pulmonary/Chest: Effort normal and breath  sounds normal. No accessory muscle usage. No respiratory distress. She has no wheezes. She exhibits no tenderness.  Abdominal: Normal appearance and bowel sounds are normal. She exhibits no distension and no ascites. There is no tenderness.  Musculoskeletal: Normal range of motion. She exhibits no edema or tenderness.  Expected osteoarthritis, stiffness; generalized weakness  Neurological: She is alert and oriented to person, place, and time. She has normal strength.  Skin: Skin is warm, dry and intact. No rash noted. She is not diaphoretic. No cyanosis or erythema. No pallor. Nails show no clubbing.  PICC line in Right Upper arm  Psychiatric: She has a normal mood and affect. Her speech is normal and behavior is normal. Judgment and thought content normal. Cognition and memory are normal.  Nursing note and vitals reviewed.   Labs reviewed:  Recent Labs  04/23/16 0323 04/30/16 0420 07/22/16 2002 07/22/16 2253 07/23/16 0341 07/24/16 0516  NA 139 134*  --  138 139  --   K 3.6 3.6  --  3.1* 3.4* 3.7  CL 107 100*  --  105 104  --   CO2 25 23  --  26 24  --   GLUCOSE 141* 130*  --  109* 133*  --   BUN 18 13  --  20 17  --   CREATININE 0.74 0.74 0.79 0.77 0.66  --   CALCIUM 8.1* 8.6*  --  8.9 9.1  --   MG 2.0 1.4*  --   --   --  1.5*    Recent Labs  01/21/16 1121 03/17/16 1402 04/30/16 0420  AST _0 ALT 16 11 12*  ALKPHOS 88 83 71  BILITOT 1.1 1.0 1.0  PROT 6.9 7.3 6.4*  ALBUMIN 4.0 4.1 3.4*    Recent Labs  04/15/16 2024  04/17/16 0449  04/30/16 0420 07/22/16 2253 07/23/16 0341  WBC 9.6  < > 8.7  < > 9.5 7.7 7.5  NEUTROABS 7.9*  --  7.1*  --  6.8*  --   --   HGB 12.7  < > 11.1*  < > 10.5* 11.7* 12.2  HCT 36.5  < > 32.5*  < > 31.2* 35.2 36.6  MCV 85.0  < > 86.3  < > 87.8 87.1 88.5  PLT 195  < > 177  < > 330 203 205  < > =  values in this interval not displayed. Lab Results  Component Value Date   TSH 3.331 07/24/2016   Lab Results  Component Value Date    HGBA1C 6.8 (H) 04/16/2016   Lab Results  Component Value Date   CHOL 170 01/21/2016   HDL 42.80 01/21/2016   LDLCALC 103 (H) 01/21/2016   LDLDIRECT 183.4 08/31/2013   TRIG 124.0 01/21/2016   CHOLHDL 4 01/21/2016    Significant Diagnostic Results in last 30 days:  Dg Chest 1 View  Result Date: 07/23/2016 CLINICAL DATA:  PICC placement. EXAM: CHEST 1 VIEW COMPARISON:  07/22/2016 FINDINGS: PICC has been inserted and the tip is 5 cm below the carina at the cavoatrial junction in good position. Heart size and pulmonary vascularity are normal. Lungs are clear. Prominent hiatal hernia. No acute bone abnormality. IMPRESSION: PICC in good position.  Hiatal hernia. Electronically Signed   By: Lorriane Shire M.D.   On: 07/23/2016 19:36  Dg Ribs Unilateral W/chest Left  Result Date: 07/22/2016 CLINICAL DATA:  Fall at home. EXAM: LEFT RIBS AND CHEST - 3+ VIEW COMPARISON:  04/20/2016 FINDINGS: The lungs are clear wiithout focal pneumonia, edema, pneumothorax or pleural effusion. Interstitial markings are diffusely coarsened with chronic features. Cardiopericardial silhouette is at upper limits of normal for size. Hiatal hernia again noted. Oblique views of the left ribs show acute fracture of the antral lateral tenth rib. IMPRESSION: 1. No acute cardiopulmonary findings. 2. Left tenth rib fracture. Electronically Signed   By: Misty Stanley M.D.   On: 07/22/2016 09:06  Ct Head Wo Contrast  Result Date: 07/23/2016 CLINICAL DATA:  Confusion.  Fall 1 day prior EXAM: CT HEAD WITHOUT CONTRAST TECHNIQUE: Contiguous axial images were obtained from the base of the skull through the vertex without intravenous contrast. COMPARISON:  April 22, 2016 FINDINGS: Brain: Mild diffuse atrophy is stable. There is no intracranial mass, hemorrhage, extra-axial fluid collection, or midline shift. There is patchy small vessel disease in the centra semiovale bilaterally, stable. There is evidence of a prior lacunar infarct in the  posterior limb of the right internal capsule near the genu. There is evidence of a prior small infarct in the left posterior thalamus, stable. There is no new gray-white compartment lesion. No acute infarct evident. Vascular: No hyperdense vessel is evident. There are foci of calcification in the distal carotid siphon regions bilaterally. Skull: There is a small right frontal scalp hematoma. The bony calvarium appears intact. Sinuses/Orbits: No orbital lesions are evident. Visualized paranasal sinuses are clear. Other: Mastoid air cells are clear. IMPRESSION: Atrophy with periventricular small vessel disease and prior small infarcts as noted above. No acute infarct evident. No intracranial mass, hemorrhage, or extra-axial fluid collection. Foci of calcification in each distal carotid siphon. Right frontal scalp hematoma. No underlying fracture. Electronically Signed   By: Lowella Grip III M.D.   On: 07/23/2016 11:06  Dg Hip Unilat With Pelvis 2-3 Views Left  Result Date: 07/22/2016 CLINICAL DATA:  Pain following fall EXAM: DG HIP (WITH OR WITHOUT PELVIS) 2-3V LEFT COMPARISON:  April 16, 2016 FINDINGS: Frontal pelvis as well as frontal and lateral left hip images were obtained. There is a total hip prosthesis on the left which appears well seated. There is no acute fracture or dislocation. There is a stimulator with its tip overlying the lower sacrum on the left. There is slight narrowing of the left hip joint. IMPRESSION: No fracture or dislocation. Left total hip prosthesis appears well-seated. Slight narrowing right hip joint. Stimulator lead  overlies the lower sacrum on the left. Electronically Signed   By: Lowella Grip III M.D.   On: 07/22/2016 09:06   Assessment/Plan 1. Multiple falls Labs to eval for vitamin deficiencies Continue working with PT/OT for strengthening   2. Urinary tract infection due to ESBL Klebsiella Continue IV abts Monitor PICC line patency PICC care/ dressing  changes per protocol  Family/ staff Communication:   Total Time: 30 minutes  Documentation: 15 minutes  Face to Face: 15 minutes  Family/Phone:   Labs/tests ordered:  Cbc, met c, b12, d, mag+  Medication list reviewed and assessed for continued appropriateness. Monthly medication orders reviewed and signed.  Vikki Ports, NP-C Geriatrics [facility] [phone] [place],  [address] Cell Phone (Mon-Fri 8am-5pm):  [phone] On Call:  [phone] & follow prompts after 5pm & weekends Office Phone:  [phone] Office Fax:  [phone]

## 2016-08-17 LAB — GLUCOSE, CAPILLARY
Glucose-Capillary: 140 mg/dL — ABNORMAL HIGH (ref 65–99)
Glucose-Capillary: 131 mg/dL — ABNORMAL HIGH (ref 65–99)

## 2016-08-18 LAB — CBC WITH DIFFERENTIAL/PLATELET
Basophils Absolute: 0.1 10*3/uL (ref 0–0.1)
Basophils Relative: 1 %
Eosinophils Absolute: 0.3 10*3/uL (ref 0–0.7)
Eosinophils Relative: 3 %
HCT: 35.8 % (ref 35.0–47.0)
Hemoglobin: 12.5 g/dL (ref 12.0–16.0)
Lymphocytes Relative: 11 %
Lymphs Abs: 1.1 10*3/uL (ref 1.0–3.6)
MCH: 30 pg (ref 26.0–34.0)
MCHC: 34.9 g/dL (ref 32.0–36.0)
MCV: 86 fL (ref 80.0–100.0)
Monocytes Absolute: 0.6 10*3/uL (ref 0.2–0.9)
Monocytes Relative: 6 %
Neutro Abs: 7.6 10*3/uL — ABNORMAL HIGH (ref 1.4–6.5)
Neutrophils Relative %: 79 %
Platelets: 204 10*3/uL (ref 150–440)
RBC: 4.17 MIL/uL (ref 3.80–5.20)
RDW: 14 % (ref 11.5–14.5)
WBC: 9.6 10*3/uL (ref 3.6–11.0)

## 2016-08-18 LAB — VITAMIN B12: Vitamin B-12: 2793 pg/mL — ABNORMAL HIGH (ref 180–914)

## 2016-08-18 LAB — COMPREHENSIVE METABOLIC PANEL
ALT: 28 U/L (ref 14–54)
AST: 23 U/L (ref 15–41)
Albumin: 3.6 g/dL (ref 3.5–5.0)
Alkaline Phosphatase: 86 U/L (ref 38–126)
Anion gap: 9 (ref 5–15)
BUN: 20 mg/dL (ref 6–20)
CO2: 24 mmol/L (ref 22–32)
Calcium: 8.9 mg/dL (ref 8.9–10.3)
Chloride: 105 mmol/L (ref 101–111)
Creatinine, Ser: 0.77 mg/dL (ref 0.44–1.00)
GFR calc Af Amer: >60 mL/min (ref >60)
GFR calc non Af Amer: >60 mL/min (ref >60)
Glucose, Bld: 131 mg/dL — ABNORMAL HIGH (ref 65–99)
Potassium: 3.4 mmol/L — ABNORMAL LOW (ref 3.5–5.1)
Sodium: 138 mmol/L (ref 135–145)
Total Bilirubin: 0.2 mg/dL — ABNORMAL LOW (ref 0.3–1.2)
Total Protein: 6.7 g/dL (ref 6.5–8.1)

## 2016-08-18 LAB — GLUCOSE, CAPILLARY
Glucose-Capillary: 136 mg/dL — ABNORMAL HIGH (ref 65–99)
Glucose-Capillary: 191 mg/dL — ABNORMAL HIGH (ref 65–99)

## 2016-08-18 LAB — MAGNESIUM: Magnesium: 1.6 mg/dL — ABNORMAL LOW (ref 1.7–2.4)

## 2016-08-19 LAB — GLUCOSE, CAPILLARY
Glucose-Capillary: 121 mg/dL — ABNORMAL HIGH (ref 65–99)
Glucose-Capillary: 136 mg/dL — ABNORMAL HIGH (ref 65–99)

## 2016-08-19 LAB — VITAMIN D 25 HYDROXY (VIT D DEFICIENCY, FRACTURES): Vit D, 25-Hydroxy: 10.6 ng/mL — ABNORMAL LOW (ref 30.0–100.0)

## 2016-08-22 LAB — GLUCOSE, CAPILLARY
Glucose-Capillary: 128 mg/dL — ABNORMAL HIGH (ref 65–99)
Glucose-Capillary: 156 mg/dL — ABNORMAL HIGH (ref 65–99)
Glucose-Capillary: 131 mg/dL — ABNORMAL HIGH (ref 65–99)
Glucose-Capillary: 189 mg/dL — ABNORMAL HIGH (ref 65–99)
Glucose-Capillary: 136 mg/dL — ABNORMAL HIGH (ref 65–99)

## 2016-08-23 LAB — GLUCOSE, CAPILLARY: Glucose-Capillary: 211 mg/dL — ABNORMAL HIGH (ref 65–99)

## 2016-08-24 ENCOUNTER — Non-Acute Institutional Stay (SKILLED_NURSING_FACILITY): Payer: Medicare Other | Admitting: Gerontology

## 2016-08-24 DIAGNOSIS — K567 Ileus, unspecified: Secondary | ICD-10-CM

## 2016-08-24 DIAGNOSIS — R1111 Vomiting without nausea: Secondary | ICD-10-CM

## 2016-08-24 LAB — GLUCOSE, CAPILLARY
Glucose-Capillary: 148 mg/dL — ABNORMAL HIGH (ref 65–99)
Glucose-Capillary: 194 mg/dL — ABNORMAL HIGH (ref 65–99)

## 2016-08-25 LAB — COMPREHENSIVE METABOLIC PANEL
ALT: 21 U/L (ref 14–54)
AST: 20 U/L (ref 15–41)
Albumin: 4 g/dL (ref 3.5–5.0)
Alkaline Phosphatase: 80 U/L (ref 38–126)
Anion gap: 10 (ref 5–15)
BUN: 17 mg/dL (ref 6–20)
CO2: 26 mmol/L (ref 22–32)
Calcium: 9.2 mg/dL (ref 8.9–10.3)
Chloride: 101 mmol/L (ref 101–111)
Creatinine, Ser: 1.05 mg/dL — ABNORMAL HIGH (ref 0.44–1.00)
GFR calc Af Amer: 56 mL/min — ABNORMAL LOW (ref >60)
GFR calc non Af Amer: 48 mL/min — ABNORMAL LOW (ref >60)
Glucose, Bld: 142 mg/dL — ABNORMAL HIGH (ref 65–99)
Potassium: 3.5 mmol/L (ref 3.5–5.1)
Sodium: 137 mmol/L (ref 135–145)
Total Bilirubin: 0.6 mg/dL (ref 0.3–1.2)
Total Protein: 7.5 g/dL (ref 6.5–8.1)

## 2016-08-25 LAB — CBC WITH DIFFERENTIAL/PLATELET
Basophils Absolute: 0.1 10*3/uL (ref 0–0.1)
Basophils Relative: 1 %
Eosinophils Absolute: 0.2 10*3/uL (ref 0–0.7)
Eosinophils Relative: 3 %
HCT: 37.9 % (ref 35.0–47.0)
Hemoglobin: 12.8 g/dL (ref 12.0–16.0)
Lymphocytes Relative: 17 %
Lymphs Abs: 1.7 10*3/uL (ref 1.0–3.6)
MCH: 29.7 pg (ref 26.0–34.0)
MCHC: 33.9 g/dL (ref 32.0–36.0)
MCV: 87.8 fL (ref 80.0–100.0)
Monocytes Absolute: 0.7 10*3/uL (ref 0.2–0.9)
Monocytes Relative: 7 %
Neutro Abs: 6.9 10*3/uL — ABNORMAL HIGH (ref 1.4–6.5)
Neutrophils Relative %: 72 %
Platelets: 262 10*3/uL (ref 150–440)
RBC: 4.32 MIL/uL (ref 3.80–5.20)
RDW: 13.7 % (ref 11.5–14.5)
WBC: 9.6 10*3/uL (ref 3.6–11.0)

## 2016-08-26 LAB — C DIFFICILE QUICK SCREEN W PCR REFLEX
C Diff antigen: NEGATIVE
C Diff interpretation: NOT DETECTED
C Diff toxin: NEGATIVE

## 2016-08-26 LAB — URINALYSIS COMPLETE WITH MICROSCOPIC (ARMC ONLY)
Bacteria, UA: NONE SEEN
Bilirubin Urine: NEGATIVE
Glucose, UA: NEGATIVE mg/dL
Hgb urine dipstick: NEGATIVE
Ketones, ur: NEGATIVE mg/dL
Nitrite: NEGATIVE
Protein, ur: NEGATIVE mg/dL
RBC / HPF: NONE SEEN RBC/hpf (ref 0–5)
Specific Gravity, Urine: 1.008 (ref 1.005–1.030)
pH: 6 (ref 5.0–8.0)

## 2016-08-26 LAB — OCCULT BLOOD X 1 CARD TO LAB, STOOL: Fecal Occult Bld: NEGATIVE

## 2016-08-27 DIAGNOSIS — K567 Ileus, unspecified: Secondary | ICD-10-CM | POA: Insufficient documentation

## 2016-08-27 NOTE — Progress Notes (Signed)
Location:      Place of Service:    Provider:  Toni Arthurs, NP-C  Einar Pheasant, MD  Patient Care Team: Einar Pheasant, MD as PCP - General (Internal Medicine)  Extended Emergency Contact Information Primary Emergency Contact: [place] of [place] Phone: [phone] Relation: Son Secondary Emergency Contact: Engebretsen,Chuck Address: [facility]          [place], [place] [address] Johnnette Litter of [place] Phone: [phone] Mobile Phone: [phone] Relation: Son  Code Status:  full Goals of care: Advanced Directive information Advanced Directives 07/22/2016  Does patient have an advance directive? Yes  Type of Advance Directive [place]  Does patient want to make changes to advanced directive? No - Patient declined  Copy of advanced directive(s) in chart? No - copy requested  Would patient like information on creating an advanced directive? No - patient declined information     No chief complaint on file.   HPI:  Pt is a 82 y.o. female seen today for an acute visit for vomiting. Moraima is at the facility for rehab following deconditioning with multiple falls, weakness and ESBL UTI.  But she has been progressing with physical therapy but today she began eating strangled on her food and water.  Speech therapy did an evaluation of her today.  They did not find that she seemed to have difficulty swallowing, but more so in just keeping it down.  Patient reports this has happened once before, but this time seems to be worse.  When physical therapy was in her room working with her roommate, she began having multiple bouts of foul smelling diarrhea.  Patient denies nausea, abdominal pain, fever or chills, chest pain and shortness of breath.  She reports most days she feels fine other than not being able to keep it down.  No other complaints.  Vital signs stable   Past Medical History:  Diagnosis Date  . Arthritis   . Cancer ([place])    skin  . Colon polyps   . COPD (chronic obstructive pulmonary disease) ([place])   . Diverticulosis   . GERD (gastroesophageal reflux disease)    with esophageal stricture requiring dilatation x 2 (12/96 and 10/99)  . Glaucoma   . History of chicken pox   . Hypercholesterolemia   . Hypertension   . Hypothyroidism   . Migraines   . Peripheral neuropathy ([place])   . Urinary incontinence    Past Surgical History:  Procedure Laterality Date  . ABDOMINAL HYSTERECTOMY  1961   ovaries not removed  . APPENDECTOMY    . bladder tack  1976  . CATARACT EXTRACTION  1997   bilateral  . CHOLECYSTECTOMY    . HIP ARTHROPLASTY Left 04/16/2016   Procedure: ARTHROPLASTY BIPOLAR HIP (HEMIARTHROPLASTY);  Surgeon: Corky Mull, MD;  Location: [facility] ORS;  Service: Orthopedics;  Laterality: Left;  . INTERSTIM IMPLANT PLACEMENT  2011  . TONSILLECTOMY      Allergies  Allergen Reactions  . Tramadol Nausea And Vomiting      Medication List       Accurate as of 08/24/16 11:59 PM. Always use your most recent med list.          albuterol 108 (90 Base) MCG/ACT inhaler Commonly known as:  PROVENTIL HFA;VENTOLIN HFA Inhale 2 puffs into the lungs every 6 (six) hours as needed for wheezing or shortness of breath.   amLODipine 5 MG tablet Commonly known as:  NORVASC Take 1 tablet (5 mg total)  by mouth daily.   aspirin 325 MG tablet Take 325 mg by mouth daily.   atorvastatin 40 MG tablet Commonly known as:  LIPITOR Take 40 mg by mouth at bedtime.   betamethasone valerate ointment 0.1 % Commonly known as:  VALISONE Apply 1 application topically 2 (two) times daily. Do not use for more than 2 weeks consecutively.   bimatoprost 0.01 % Soln Commonly known as:  LUMIGAN Place 1 drop into both eyes at bedtime.   brinzolamide 1 % ophthalmic suspension Commonly known as:  AZOPT Place 1 drop into both eyes 2 (two) times daily.   Co Q10 100 MG Caps Take 100 mg by mouth daily.   docusate sodium 100 MG  capsule Commonly known as:  COLACE Take 1 capsule (100 mg total) by mouth 2 (two) times daily.   ertapenem 1 g in sodium chloride 0.9 % 50 mL Inject 1 g into the vein daily.   feeding supplement (ENSURE ENLIVE) Liqd Take 237 mLs by mouth 2 (two) times daily between meals.   fluticasone 50 MCG/ACT nasal spray Commonly known as:  FLONASE Place 2 sprays into both nostrils daily.   haloperidol 2 MG tablet Commonly known as:  HALDOL Take 2 mg by mouth 3 (three) times daily as needed.   HYDROcodone-acetaminophen 5-325 MG tablet Commonly known as:  NORCO/VICODIN   levothyroxine 100 MCG tablet Commonly known as:  SYNTHROID, LEVOTHROID Take 100 mcg by mouth daily before breakfast.   memantine 10 MG tablet Commonly known as:  NAMENDA Take 10 mg by mouth 2 (two) times daily.   METAMUCIL 0.52 g capsule Generic drug:  psyllium Take 2.08 g by mouth 2 (two) times daily.   metoprolol tartrate 25 MG tablet Commonly known as:  LOPRESSOR Take 1 tablet (25 mg total) by mouth 2 (two) times daily.   pantoprazole 40 MG tablet Commonly known as:  PROTONIX Take 1 tablet (40 mg total) by mouth daily.   QUEtiapine 25 MG tablet Commonly known as:  SEROQUEL Take 0.5 tablets (12.5 mg total) by mouth at bedtime.   sertraline 100 MG tablet Commonly known as:  ZOLOFT Take 100 mg by mouth daily.   triamcinolone cream 0.1 % Commonly known as:  KENALOG Apply 1 application topically 2 (two) times daily as needed (for itching).   vitamin B-12 1000 MCG tablet Commonly known as:  CYANOCOBALAMIN Take 2,000 mcg by mouth daily.       Review of Systems  Constitutional: Negative for activity change, appetite change, chills, diaphoresis and fever.  HENT: Negative for congestion, sneezing, sore throat, trouble swallowing and voice change.   Eyes: Negative for pain, redness and visual disturbance.  Respiratory: Negative for apnea, cough, choking, chest tightness, shortness of breath and wheezing.     Cardiovascular: Negative for chest pain, palpitations and leg swelling.  Gastrointestinal: Positive for vomiting. Negative for abdominal distention, abdominal pain, constipation, diarrhea and nausea.  Genitourinary: Negative for difficulty urinating, dysuria, frequency and urgency.  Musculoskeletal: Negative for back pain, gait problem and myalgias. Arthralgias: typical arthritis.  Skin: Negative for color change, pallor, rash and wound.  Neurological: Negative for dizziness, tremors, syncope, speech difficulty, weakness, numbness and headaches.  Psychiatric/Behavioral: Negative for agitation and behavioral problems.  All other systems reviewed and are negative.   Immunization History  Administered Date(s) Administered  . Influenza Split 10/28/2013   Pertinent  Health Maintenance Due  Topic Date Due  . FOOT EXAM  05/20/1942  . OPHTHALMOLOGY EXAM  05/20/1942  . DEXA SCAN  05/21/1999  . PNA vac Low Risk Adult (1 of 2 - PCV13) 05/21/1999  . INFLUENZA VACCINE  07/28/2016  . URINE MICROALBUMIN  09/17/2016  . HEMOGLOBIN A1C  10/16/2016  . MAMMOGRAM  02/26/2017   Fall Risk  07/04/2015 05/16/2015 03/26/2014 01/06/2013 11/27/2012  Falls in the past year? No No Yes Yes No  Number falls in past yr: - - 1 1 -  Injury with Fall? - - - No -  Risk for fall due to : - - History of fall(s);Impaired mobility - -   Functional Status Survey:    There were no vitals filed for this visit. There is no height or weight on file to calculate BMI. Physical Exam  Constitutional: She is oriented to person, place, and time. Vital signs are normal. She appears well-developed and well-nourished. She is active and cooperative. She does not appear ill. No distress.  HENT:  Head: Normocephalic and atraumatic.  Mouth/Throat: Uvula is midline, oropharynx is clear and moist and mucous membranes are normal. Mucous membranes are not pale, not dry and not cyanotic.  Eyes: Conjunctivae, EOM and lids are normal. Pupils  are equal, round, and reactive to light.  Neck: Trachea normal, normal range of motion and full passive range of motion without pain. Neck supple. No JVD present. No tracheal deviation, no edema and no erythema present. No thyromegaly present.  Cardiovascular: Normal rate, regular rhythm, intact distal pulses and normal pulses.  Exam reveals no gallop, no distant heart sounds and no friction rub.   No murmur heard. Pulmonary/Chest: Effort normal and breath sounds normal. No accessory muscle usage. No respiratory distress. She has no wheezes. She has no rales. She exhibits no tenderness.  Abdominal: Normal appearance and bowel sounds are normal. She exhibits no distension and no ascites. There is no tenderness.  Musculoskeletal: Normal range of motion. She exhibits no edema or tenderness.  Expected osteoarthritis, stiffness; generalized weakness  Neurological: She is alert and oriented to person, place, and time. She has normal strength.  Skin: Skin is warm, dry and intact. No rash noted. She is not diaphoretic. No cyanosis or erythema. No pallor. Nails show no clubbing.  PICC line in Right Upper arm  Psychiatric: She has a normal mood and affect. Her speech is normal and behavior is normal. Judgment and thought content normal. Cognition and memory are normal.  Nursing note and vitals reviewed.   Labs reviewed:  Recent Labs  04/30/16 0420  07/23/16 0341 07/24/16 0516 08/18/16 0658 08/25/16 1535  NA 134*  < > 139  --  138 137  K 3.6  < > 3.4* 3.7 3.4* 3.5  CL 100*  < > 104  --  105 101  CO2 23  < > 24  --  24 26  GLUCOSE 130*  < > 133*  --  131* 142*  BUN 13  < > 17  --  20 17  CREATININE 0.74  < > 0.66  --  0.77 1.05*  CALCIUM 8.6*  < > 9.1  --  8.9 9.2  MG 1.4*  --   --  1.5* 1.6*  --   < > = values in this interval not displayed.  Recent Labs  04/30/16 0420 08/18/16 0658 08/25/16 1535  AST 15 23 20   ALT 12* 28 21  ALKPHOS 71 86 80  BILITOT 1.0 0.2* 0.6  PROT 6.4* 6.7 7.5   ALBUMIN 3.4* 3.6 4.0    Recent Labs  04/30/16 0420  07/23/16 0341 08/18/16  4536 08/25/16 1535  WBC 9.5  < > 7.5 9.6 9.6  NEUTROABS 6.8*  --   --  7.6* 6.9*  HGB 10.5*  < > 12.2 12.5 12.8  HCT 31.2*  < > 36.6 35.8 37.9  MCV 87.8  < > 88.5 86.0 87.8  PLT 330  < > 205 204 262  < > = values in this interval not displayed. Lab Results  Component Value Date   TSH 3.331 07/24/2016   Lab Results  Component Value Date   HGBA1C 6.8 (H) 04/16/2016   Lab Results  Component Value Date   CHOL 170 01/21/2016   HDL 42.80 01/21/2016   LDLCALC 103 (H) 01/21/2016   LDLDIRECT 183.4 08/31/2013   TRIG 124.0 01/21/2016   CHOLHDL 4 01/21/2016    Significant Diagnostic Results in last 30 days:  No results found.  Assessment/Plan 1. Vomiting without nausea, vomiting of unspecified type  Clear liquid diet x 4 days  Then increase diet to bland, soft  Prn zofran  Labs- IVF if dehydrated  2. Ileus ([facility])  Clear liquid diet x 4 days  Then increase diet to bland, soft  Bisacodyl suppository x 1 now  Milk of Magnesia 30 ml x 1 now  Family/ staff Communication:   Total Time:  Documentation:  Face to Face:  Family/Phone:   Labs/tests ordered:  Complete abdominal series xrays, cbc, met c, hemoccult stools, stool for c-dif  Medication list reviewed and assessed for continued appropriateness.  Vikki Ports, NP-C Geriatrics [facility] [phone], [place] [address] Cell Phone (Mon-Fri 8am-5pm):  [phone] On Call:  [phone] & follow prompts after 5pm & weekends Office Phone:  [phone] Office Fax:  [phone]

## 2016-08-28 ENCOUNTER — Encounter
Admission: RE | Admit: 2016-08-28 | Discharge: 2016-08-28 | Disposition: A | Discharge status: Home / Self Care | Payer: Medicare Other | Source: Ambulatory Visit | Attending: Internal Medicine | Admitting: Internal Medicine

## 2016-08-28 ENCOUNTER — Non-Acute Institutional Stay (SKILLED_NURSING_FACILITY): Payer: Medicare Other | Admitting: Gerontology

## 2016-08-28 DIAGNOSIS — K567 Ileus, unspecified: Secondary | ICD-10-CM

## 2016-08-28 DIAGNOSIS — N390 Urinary tract infection, site not specified: Secondary | ICD-10-CM

## 2016-08-28 LAB — GLUCOSE, CAPILLARY: Glucose-Capillary: 176 mg/dL — ABNORMAL HIGH (ref 65–99)

## 2016-08-29 LAB — URINE CULTURE: Culture: 20000 — AB

## 2016-08-29 LAB — GLUCOSE, CAPILLARY
Glucose-Capillary: 134 mg/dL — ABNORMAL HIGH (ref 65–99)
Glucose-Capillary: 170 mg/dL — ABNORMAL HIGH (ref 65–99)

## 2016-08-30 LAB — GLUCOSE, CAPILLARY
Glucose-Capillary: 127 mg/dL — ABNORMAL HIGH (ref 65–99)
Glucose-Capillary: 148 mg/dL — ABNORMAL HIGH (ref 65–99)

## 2016-08-31 LAB — GLUCOSE, CAPILLARY
Glucose-Capillary: 154 mg/dL — ABNORMAL HIGH (ref 65–99)
Glucose-Capillary: 180 mg/dL — ABNORMAL HIGH (ref 65–99)

## 2016-09-01 LAB — GLUCOSE, CAPILLARY
Glucose-Capillary: 127 mg/dL — ABNORMAL HIGH (ref 65–99)
Glucose-Capillary: 155 mg/dL — ABNORMAL HIGH (ref 65–99)

## 2016-09-02 LAB — GLUCOSE, CAPILLARY
Glucose-Capillary: 130 mg/dL — ABNORMAL HIGH (ref 65–99)
Glucose-Capillary: 150 mg/dL — ABNORMAL HIGH (ref 65–99)

## 2016-09-03 LAB — URINALYSIS COMPLETE WITH MICROSCOPIC (ARMC ONLY)
Bacteria, UA: NONE SEEN
Bilirubin Urine: NEGATIVE
Glucose, UA: NEGATIVE mg/dL
Hgb urine dipstick: NEGATIVE
Ketones, ur: NEGATIVE mg/dL
Leukocytes, UA: NEGATIVE
Nitrite: NEGATIVE
Protein, ur: NEGATIVE mg/dL
Specific Gravity, Urine: 1.01 (ref 1.005–1.030)
pH: 6 (ref 5.0–8.0)

## 2016-09-03 LAB — GLUCOSE, CAPILLARY: Glucose-Capillary: 125 mg/dL — ABNORMAL HIGH (ref 65–99)

## 2016-09-04 LAB — GLUCOSE, CAPILLARY
Glucose-Capillary: 151 mg/dL — ABNORMAL HIGH (ref 65–99)
Glucose-Capillary: 134 mg/dL — ABNORMAL HIGH (ref 65–99)
Glucose-Capillary: 143 mg/dL — ABNORMAL HIGH (ref 65–99)

## 2016-09-04 LAB — URINE CULTURE: Culture: NO GROWTH

## 2016-09-05 LAB — GLUCOSE, CAPILLARY
Glucose-Capillary: 190 mg/dL — ABNORMAL HIGH (ref 65–99)
Glucose-Capillary: 252 mg/dL — ABNORMAL HIGH (ref 65–99)
Glucose-Capillary: 128 mg/dL — ABNORMAL HIGH (ref 65–99)
Glucose-Capillary: 132 mg/dL — ABNORMAL HIGH (ref 65–99)

## 2016-09-06 LAB — GLUCOSE, CAPILLARY
Glucose-Capillary: 130 mg/dL — ABNORMAL HIGH (ref 65–99)
Glucose-Capillary: 183 mg/dL — ABNORMAL HIGH (ref 65–99)

## 2016-09-06 NOTE — Progress Notes (Signed)
Location:      Place of Service:  SNF (31) Provider:  Toni Arthurs, NP-C  Einar Pheasant, MD  Patient Care Team: Einar Pheasant, MD as PCP - General (Internal Medicine)  Extended Emergency Contact Information Primary Emergency Contact: [place] of [place] Phone: [phone] Relation: Son Secondary Emergency Contact: Paske,Chuck Address: [facility]          [place],  [address] Johnnette Litter of [place] Phone: [phone] Mobile Phone: [phone] Relation: Son  Code Status:  dnr Goals of care: Advanced Directive information Advanced Directives 07/22/2016  Does patient have an advance directive? Yes  Type of Advance Directive [place]  Does patient want to make changes to advanced directive? No - Patient declined  Copy of advanced directive(s) in chart? No - copy requested  Would patient like information on creating an advanced directive? No - patient declined information     Chief Complaint  Patient presents with  . Follow-up    HPI:  Pt is a 82 y.o. female seen today for an acute visit for UTI and follow up on ileus. Pt reports she is feeling better. Passing gas, + BM. Denies nausea/ vomiting at this time. Wants regular diet. Denies abdominal pain, back pain. Denies burning, frequency, etc. Afebrile. VSS, no other complaints    Past Medical History:  Diagnosis Date  . Arthritis   . Cancer ([place])    skin  . Colon polyps   . COPD (chronic obstructive pulmonary disease) ([place])   . Diverticulosis   . GERD (gastroesophageal reflux disease)    with esophageal stricture requiring dilatation x 2 (12/96 and 10/99)  . Glaucoma   . History of chicken pox   . Hypercholesterolemia   . Hypertension   . Hypothyroidism   . Migraines   . Peripheral neuropathy ([place])   . Urinary incontinence    Past Surgical History:  Procedure Laterality Date  . ABDOMINAL HYSTERECTOMY  1961   ovaries not removed  . APPENDECTOMY      . bladder tack  1976  . CATARACT EXTRACTION  1997   bilateral  . CHOLECYSTECTOMY    . HIP ARTHROPLASTY Left 04/16/2016   Procedure: ARTHROPLASTY BIPOLAR HIP (HEMIARTHROPLASTY);  Surgeon: Corky Mull, MD;  Location: [facility] ORS;  Service: Orthopedics;  Laterality: Left;  . INTERSTIM IMPLANT PLACEMENT  2011  . TONSILLECTOMY      Allergies  Allergen Reactions  . Tramadol Nausea And Vomiting      Medication List       Accurate as of 08/28/16 11:59 PM. Always use your most recent med list.          albuterol 108 (90 Base) MCG/ACT inhaler Commonly known as:  PROVENTIL HFA;VENTOLIN HFA Inhale 2 puffs into the lungs every 6 (six) hours as needed for wheezing or shortness of breath.   amLODipine 5 MG tablet Commonly known as:  NORVASC Take 1 tablet (5 mg total) by mouth daily.   aspirin 325 MG tablet Take 325 mg by mouth daily.   atorvastatin 40 MG tablet Commonly known as:  LIPITOR Take 40 mg by mouth at bedtime.   betamethasone valerate ointment 0.1 % Commonly known as:  VALISONE Apply 1 application topically 2 (two) times daily. Do not use for more than 2 weeks consecutively.   bimatoprost 0.01 % Soln Commonly known as:  LUMIGAN Place 1 drop into both eyes at bedtime.   brinzolamide 1 % ophthalmic suspension Commonly known as:  AZOPT Place 1  drop into both eyes 2 (two) times daily.   Co Q10 100 MG Caps Take 100 mg by mouth daily.   docusate sodium 100 MG capsule Commonly known as:  COLACE Take 1 capsule (100 mg total) by mouth 2 (two) times daily.   ertapenem 1 g in sodium chloride 0.9 % 50 mL Inject 1 g into the vein daily.   feeding supplement (ENSURE ENLIVE) Liqd Take 237 mLs by mouth 2 (two) times daily between meals.   fluticasone 50 MCG/ACT nasal spray Commonly known as:  FLONASE Place 2 sprays into both nostrils daily.   haloperidol 2 MG tablet Commonly known as:  HALDOL Take 2 mg by mouth 3 (three) times daily as needed.    HYDROcodone-acetaminophen 5-325 MG tablet Commonly known as:  NORCO/VICODIN   levothyroxine 100 MCG tablet Commonly known as:  SYNTHROID, LEVOTHROID Take 100 mcg by mouth daily before breakfast.   memantine 10 MG tablet Commonly known as:  NAMENDA Take 10 mg by mouth 2 (two) times daily.   METAMUCIL 0.52 g capsule Generic drug:  psyllium Take 2.08 g by mouth 2 (two) times daily.   metoprolol tartrate 25 MG tablet Commonly known as:  LOPRESSOR Take 1 tablet (25 mg total) by mouth 2 (two) times daily.   pantoprazole 40 MG tablet Commonly known as:  PROTONIX Take 1 tablet (40 mg total) by mouth daily.   QUEtiapine 25 MG tablet Commonly known as:  SEROQUEL Take 0.5 tablets (12.5 mg total) by mouth at bedtime.   sertraline 100 MG tablet Commonly known as:  ZOLOFT Take 100 mg by mouth daily.   triamcinolone cream 0.1 % Commonly known as:  KENALOG Apply 1 application topically 2 (two) times daily as needed (for itching).   vitamin B-12 1000 MCG tablet Commonly known as:  CYANOCOBALAMIN Take 2,000 mcg by mouth daily.       Review of Systems  Constitutional: Negative for activity change, appetite change, chills, diaphoresis and fever.  HENT: Negative for congestion, sneezing, sore throat, trouble swallowing and voice change.   Eyes: Negative for pain, redness and visual disturbance.  Respiratory: Negative for apnea, cough, choking, chest tightness, shortness of breath and wheezing.   Cardiovascular: Negative for chest pain, palpitations and leg swelling.  Gastrointestinal: Positive for vomiting. Negative for abdominal distention, abdominal pain, constipation, diarrhea and nausea.  Genitourinary: Negative for difficulty urinating, dysuria, frequency and urgency.  Musculoskeletal: Negative for back pain, gait problem and myalgias. Arthralgias: typical arthritis.  Skin: Negative for color change, pallor, rash and wound.  Neurological: Negative for dizziness, tremors,  syncope, speech difficulty, weakness, numbness and headaches.  Psychiatric/Behavioral: Negative for agitation and behavioral problems.  All other systems reviewed and are negative.   Immunization History  Administered Date(s) Administered  . Influenza Split 10/28/2013   Pertinent  Health Maintenance Due  Topic Date Due  . FOOT EXAM  05/20/1942  . OPHTHALMOLOGY EXAM  05/20/1942  . DEXA SCAN  05/21/1999  . PNA vac Low Risk Adult (1 of 2 - PCV13) 05/21/1999  . INFLUENZA VACCINE  07/28/2016  . URINE MICROALBUMIN  09/17/2016  . HEMOGLOBIN A1C  10/16/2016  . MAMMOGRAM  02/26/2017   Fall Risk  07/04/2015 05/16/2015 03/26/2014 01/06/2013 11/27/2012  Falls in the past year? No No Yes Yes No  Number falls in past yr: - - 1 1 -  Injury with Fall? - - - No -  Risk for fall due to : - - History of fall(s);Impaired mobility - -  Functional Status Survey:    Vitals:   08/28/16 0500  BP: 119/66  Pulse: 60  Resp: 17  Temp: 98 F (36.7 C)  SpO2: 98%   There is no height or weight on file to calculate BMI. Physical Exam  Constitutional: She is oriented to person, place, and time. Vital signs are normal. She appears well-developed and well-nourished. She is active and cooperative. She does not appear ill. No distress.  HENT:  Head: Normocephalic and atraumatic.  Mouth/Throat: Uvula is midline, oropharynx is clear and moist and mucous membranes are normal. Mucous membranes are not pale, not dry and not cyanotic.  Eyes: Conjunctivae, EOM and lids are normal. Pupils are equal, round, and reactive to light.  Neck: Trachea normal, normal range of motion and full passive range of motion without pain. Neck supple. No JVD present. No tracheal deviation, no edema and no erythema present. No thyromegaly present.  Cardiovascular: Normal rate, regular rhythm, intact distal pulses and normal pulses.  Exam reveals no gallop, no distant heart sounds and no friction rub.   No murmur heard. Pulmonary/Chest:  Effort normal and breath sounds normal. No accessory muscle usage. No respiratory distress. She has no wheezes. She has no rales. She exhibits no tenderness.  Abdominal: Normal appearance and bowel sounds are normal. She exhibits no distension and no ascites. There is no tenderness.  Musculoskeletal: Normal range of motion. She exhibits no edema or tenderness.  Expected osteoarthritis, stiffness; generalized weakness  Neurological: She is alert and oriented to person, place, and time. She has normal strength.  Skin: Skin is warm, dry and intact. No rash noted. She is not diaphoretic. No cyanosis or erythema. No pallor. Nails show no clubbing.  PICC line in Right Upper arm  Psychiatric: She has a normal mood and affect. Her speech is normal and behavior is normal. Judgment and thought content normal. Cognition and memory are normal.  Nursing note and vitals reviewed.   Labs reviewed:  Recent Labs  04/30/16 0420  07/23/16 0341 07/24/16 0516 08/18/16 0658 08/25/16 1535  NA 134*  < > 139  --  138 137  K 3.6  < > 3.4* 3.7 3.4* 3.5  CL 100*  < > 104  --  105 101  CO2 23  < > 24  --  24 26  GLUCOSE 130*  < > 133*  --  131* 142*  BUN 13  < > 17  --  20 17  CREATININE 0.74  < > 0.66  --  0.77 1.05*  CALCIUM 8.6*  < > 9.1  --  8.9 9.2  MG 1.4*  --   --  1.5* 1.6*  --   < > = values in this interval not displayed.  Recent Labs  04/30/16 0420 08/18/16 0658 08/25/16 1535  AST 15 23 20   ALT 12* 28 21  ALKPHOS 71 86 80  BILITOT 1.0 0.2* 0.6  PROT 6.4* 6.7 7.5  ALBUMIN 3.4* 3.6 4.0    Recent Labs  04/30/16 0420  07/23/16 0341 08/18/16 0658 08/25/16 1535  WBC 9.5  < > 7.5 9.6 9.6  NEUTROABS 6.8*  --   --  7.6* 6.9*  HGB 10.5*  < > 12.2 12.5 12.8  HCT 31.2*  < > 36.6 35.8 37.9  MCV 87.8  < > 88.5 86.0 87.8  PLT 330  < > 205 204 262  < > = values in this interval not displayed. Lab Results  Component Value Date   TSH 3.331  07/24/2016   Lab Results  Component Value Date    HGBA1C 6.8 (H) 04/16/2016   Lab Results  Component Value Date   CHOL 170 01/21/2016   HDL 42.80 01/21/2016   LDLCALC 103 (H) 01/21/2016   LDLDIRECT 183.4 08/31/2013   TRIG 124.0 01/21/2016   CHOLHDL 4 01/21/2016    Significant Diagnostic Results in last 30 days:  No results found.  Assessment/Plan 1. Ileus ([place])  Clear liquid completed  OK to progress to regular diet  Encourage PO fluid intake  2. Urinary tract infection without hematuria, site unspecified  Monurol 3 grams- 1 packet EOD x 3 doses  Recheck UA, C&S at completion of treatment to verify irradication   Family/ staff Communication:   Total Time:  Documentation:  Face to Face:  Family/Phone:   Labs/tests ordered:  none  Medication list reviewed and assessed for continued appropriateness.  Vikki Ports, NP-C Geriatrics [facility] [phone] [place], [place] [address] Cell Phone (Mon-Fri 8am-5pm):  [phone] On Call:  [phone] & follow prompts after 5pm & weekends Office Phone:  [phone] Office Fax:  [phone]

## 2016-09-07 ENCOUNTER — Non-Acute Institutional Stay (SKILLED_NURSING_FACILITY): Payer: Medicare Other | Admitting: Gerontology

## 2016-09-07 DIAGNOSIS — R296 Repeated falls: Secondary | ICD-10-CM

## 2016-09-07 DIAGNOSIS — N390 Urinary tract infection, site not specified: Secondary | ICD-10-CM

## 2016-09-07 DIAGNOSIS — B9689 Other specified bacterial agents as the cause of diseases classified elsewhere: Secondary | ICD-10-CM | POA: Diagnosis not present

## 2016-09-07 NOTE — Progress Notes (Signed)
Location:      Place of Service:  SNF (31) Provider:  Toni Arthurs, NP-C  Einar Pheasant, MD  Patient Care Team: Einar Pheasant, MD as PCP - General (Internal Medicine)  Extended Emergency Contact Information Primary Emergency Contact: [place] Phone: [phone] Relation: Son Secondary Emergency Contact: Gibbard,Chuck Address: [facility]          [place], [place] [address] Johnnette Litter of [place] Phone: [phone] Mobile Phone: [phone] Relation: Son  Code Status:  dnr Goals of care: Advanced Directive information Advanced Directives 07/22/2016  Does patient have an advance directive? Yes  Type of Advance Directive [place]  Does patient want to make changes to advanced directive? No - Patient declined  Copy of advanced directive(s) in chart? No - copy requested  Would patient like information on creating an advanced directive? No - patient declined information     Chief Complaint  Patient presents with  . Discharge Note    HPI:  Pt is a 82 y.o. female seen today for follow up following admission for ESBL UTI receiving IV abt via PICC line, followed by PO Monurol. She is also at the facility for rehab for weakness, multiple falls. She has been working with physical and occupational therapies. Says pain is well controlled. Appetite good. Memory deficits. Poor judgement. Likely needs ALF at discharge vs. Full-time caregivers for safety. VSS. No other c/o.    Past Medical History:  Diagnosis Date  . Arthritis   . Cancer ([place])    skin  . Colon polyps   . COPD (chronic obstructive pulmonary disease) ([place])   . Diverticulosis   . GERD (gastroesophageal reflux disease)    with esophageal stricture requiring dilatation x 2 (12/96 and 10/99)  . Glaucoma   . History of chicken pox   . Hypercholesterolemia   . Hypertension   . Hypothyroidism   . Migraines   . Peripheral neuropathy ([place])   . Urinary  incontinence    Past Surgical History:  Procedure Laterality Date  . ABDOMINAL HYSTERECTOMY  1961   ovaries not removed  . APPENDECTOMY    . bladder tack  1976  . CATARACT EXTRACTION  1997   bilateral  . CHOLECYSTECTOMY    . HIP ARTHROPLASTY Left 04/16/2016   Procedure: ARTHROPLASTY BIPOLAR HIP (HEMIARTHROPLASTY);  Surgeon: Corky Mull, MD;  Location: [facility] ORS;  Service: Orthopedics;  Laterality: Left;  . INTERSTIM IMPLANT PLACEMENT  2011  . TONSILLECTOMY      Allergies  Allergen Reactions  . Tramadol Nausea And Vomiting      Medication List       Accurate as of 09/07/16  2:40 PM. Always use your most recent med list.          albuterol 108 (90 Base) MCG/ACT inhaler Commonly known as:  PROVENTIL HFA;VENTOLIN HFA Inhale 2 puffs into the lungs every 6 (six) hours as needed for wheezing or shortness of breath.   amLODipine 5 MG tablet Commonly known as:  NORVASC Take 1 tablet (5 mg total) by mouth daily.   aspirin 325 MG tablet Take 325 mg by mouth daily.   atorvastatin 40 MG tablet Commonly known as:  LIPITOR Take 40 mg by mouth at bedtime.   betamethasone valerate ointment 0.1 % Commonly known as:  VALISONE Apply 1 application topically 2 (two) times daily. Do not use for more than 2 weeks consecutively.   bimatoprost 0.01 % Soln Commonly known as:  LUMIGAN Place  1 drop into both eyes at bedtime.   brinzolamide 1 % ophthalmic suspension Commonly known as:  AZOPT Place 1 drop into both eyes 2 (two) times daily.   Co Q10 100 MG Caps Take 100 mg by mouth daily.   docusate sodium 100 MG capsule Commonly known as:  COLACE Take 1 capsule (100 mg total) by mouth 2 (two) times daily.   ertapenem 1 g in sodium chloride 0.9 % 50 mL Inject 1 g into the vein daily.   feeding supplement (ENSURE ENLIVE) Liqd Take 237 mLs by mouth 2 (two) times daily between meals.   fluticasone 50 MCG/ACT nasal spray Commonly known as:  FLONASE Place 2 sprays into both  nostrils daily.   haloperidol 2 MG tablet Commonly known as:  HALDOL Take 2 mg by mouth 3 (three) times daily as needed.   HYDROcodone-acetaminophen 5-325 MG tablet Commonly known as:  NORCO/VICODIN   levothyroxine 100 MCG tablet Commonly known as:  SYNTHROID, LEVOTHROID Take 100 mcg by mouth daily before breakfast.   memantine 10 MG tablet Commonly known as:  NAMENDA Take 10 mg by mouth 2 (two) times daily.   METAMUCIL 0.52 g capsule Generic drug:  psyllium Take 2.08 g by mouth 2 (two) times daily.   metoprolol tartrate 25 MG tablet Commonly known as:  LOPRESSOR Take 1 tablet (25 mg total) by mouth 2 (two) times daily.   pantoprazole 40 MG tablet Commonly known as:  PROTONIX Take 1 tablet (40 mg total) by mouth daily.   QUEtiapine 25 MG tablet Commonly known as:  SEROQUEL Take 0.5 tablets (12.5 mg total) by mouth at bedtime.   sertraline 100 MG tablet Commonly known as:  ZOLOFT Take 100 mg by mouth daily.   triamcinolone cream 0.1 % Commonly known as:  KENALOG Apply 1 application topically 2 (two) times daily as needed (for itching).   vitamin B-12 1000 MCG tablet Commonly known as:  CYANOCOBALAMIN Take 2,000 mcg by mouth daily.       Review of Systems  Constitutional: Negative for activity change, appetite change, chills, diaphoresis and fever.  HENT: Negative for congestion, sneezing, sore throat, trouble swallowing and voice change.   Eyes: Negative for pain, redness and visual disturbance.  Respiratory: Negative for apnea, cough, choking, chest tightness, shortness of breath and wheezing.   Cardiovascular: Negative for chest pain, palpitations and leg swelling.  Gastrointestinal: Negative for abdominal distention, abdominal pain, constipation, diarrhea and nausea.  Genitourinary: Negative for difficulty urinating, dysuria, frequency and urgency.  Musculoskeletal: Negative for back pain, gait problem and myalgias. Arthralgias: typical arthritis.  Skin:  Negative for color change, pallor, rash and wound.  Neurological: Negative for dizziness, tremors, syncope, speech difficulty, weakness, numbness and headaches.  Psychiatric/Behavioral: Negative for agitation and behavioral problems.  All other systems reviewed and are negative.   Immunization History  Administered Date(s) Administered  . Influenza Split 10/28/2013   Pertinent  Health Maintenance Due  Topic Date Due  . FOOT EXAM  05/20/1942  . OPHTHALMOLOGY EXAM  05/20/1942  . DEXA SCAN  05/21/1999  . PNA vac Low Risk Adult (1 of 2 - PCV13) 05/21/1999  . INFLUENZA VACCINE  07/28/2016  . URINE MICROALBUMIN  09/17/2016  . HEMOGLOBIN A1C  10/16/2016  . MAMMOGRAM  02/26/2017   Fall Risk  07/04/2015 05/16/2015 03/26/2014 01/06/2013 11/27/2012  Falls in the past year? No No Yes Yes No  Number falls in past yr: - - 1 1 -  Injury with Fall? - - - No -  Risk for fall due to : - - History of fall(s);Impaired mobility - -   Functional Status Survey:    Vitals:   09/07/16 0500  BP: 125/68  Pulse: 71  Resp: 17  Temp: 98.7 F (37.1 C)  SpO2: 96%   There is no height or weight on file to calculate BMI. Physical Exam  Constitutional: She is oriented to person, place, and time. Vital signs are normal. She appears well-developed and well-nourished. She is active and cooperative. She does not appear ill. No distress.  HENT:  Head: Normocephalic and atraumatic.  Mouth/Throat: Uvula is midline, oropharynx is clear and moist and mucous membranes are normal. Mucous membranes are not pale, not dry and not cyanotic.  Eyes: Conjunctivae, EOM and lids are normal. Pupils are equal, round, and reactive to light.  Neck: Trachea normal, normal range of motion and full passive range of motion without pain. Neck supple. No JVD present. No tracheal deviation, no edema and no erythema present. No thyromegaly present.  Cardiovascular: Normal rate, regular rhythm, intact distal pulses and normal pulses.  Exam  reveals no gallop, no distant heart sounds and no friction rub.   No murmur heard. Pulmonary/Chest: Effort normal and breath sounds normal. No accessory muscle usage. No respiratory distress. She has no wheezes. She exhibits no tenderness.  Abdominal: Normal appearance and bowel sounds are normal. She exhibits no distension and no ascites. There is no tenderness.  Musculoskeletal: Normal range of motion. She exhibits no edema or tenderness.  Expected osteoarthritis, stiffness; generalized weakness  Neurological: She is alert and oriented to person, place, and time. She has normal strength.  Skin: Skin is warm, dry and intact. No rash noted. She is not diaphoretic. No cyanosis or erythema. No pallor. Nails show no clubbing.  PICC line in Right Upper arm  Psychiatric: She has a normal mood and affect. Her speech is normal and behavior is normal. Judgment and thought content normal. Cognition and memory are normal.  Nursing note and vitals reviewed.   Labs reviewed:  Recent Labs  04/30/16 0420  07/23/16 0341 07/24/16 0516 08/18/16 0658 08/25/16 1535  NA 134*  < > 139  --  138 137  K 3.6  < > 3.4* 3.7 3.4* 3.5  CL 100*  < > 104  --  105 101  CO2 23  < > 24  --  24 26  GLUCOSE 130*  < > 133*  --  131* 142*  BUN 13  < > 17  --  20 17  CREATININE 0.74  < > 0.66  --  0.77 1.05*  CALCIUM 8.6*  < > 9.1  --  8.9 9.2  MG 1.4*  --   --  1.5* 1.6*  --   < > = values in this interval not displayed.  Recent Labs  04/30/16 0420 08/18/16 0658 08/25/16 1535  AST 15 23 20   ALT 12* 28 21  ALKPHOS 71 86 80  BILITOT 1.0 0.2* 0.6  PROT 6.4* 6.7 7.5  ALBUMIN 3.4* 3.6 4.0    Recent Labs  04/30/16 0420  07/23/16 0341 08/18/16 0658 08/25/16 1535  WBC 9.5  < > 7.5 9.6 9.6  NEUTROABS 6.8*  --   --  7.6* 6.9*  HGB 10.5*  < > 12.2 12.5 12.8  HCT 31.2*  < > 36.6 35.8 37.9  MCV 87.8  < > 88.5 86.0 87.8  PLT 330  < > 205 204 262  < > = values in this interval  not displayed. Lab Results    Component Value Date   TSH 3.331 07/24/2016   Lab Results  Component Value Date   HGBA1C 6.8 (H) 04/16/2016   Lab Results  Component Value Date   CHOL 170 01/21/2016   HDL 42.80 01/21/2016   LDLCALC 103 (H) 01/21/2016   LDLDIRECT 183.4 08/31/2013   TRIG 124.0 01/21/2016   CHOLHDL 4 01/21/2016    Significant Diagnostic Results in last 30 days:  No results found.  Assessment/Plan 1. Multiple falls  Continue working with PT/OT for strengthening   Considering ALF for safety  Needs constant caregivers at home  2. Urinary tract infection due to ESBL Klebsiella  Follow urine and culture negative for ESBL  S/p tx with IV abx and Monurol    Family/ staff Communication:   Total Time:   Documentation:   Face to Face:   Family/Phone:  Patient is being discharged with the following home health services:  PT/OT  Patient is being discharged with the following durable medical equipment:  none  Patient has been advised to f/u with their PCP in 1-2 weeks to bring them up to date on their rehab stay.  Social services at facility was responsible for arranging this appointment.  Pt was provided with a 30 day supply of prescriptions for medications and refills must be obtained from their PCP.  For controlled substances, a more limited supply may be provided adequate until PCP appointment only.    Labs/tests ordered:  None- per pcp  Medication list reviewed and assessed for continued appropriateness. Monthly medication orders reviewed and signed.  Vikki Ports, NP-C Geriatrics [facility] [phone], [place] [address] Cell Phone (Mon-Fri 8am-5pm):  [phone] On Call:  [phone] & follow prompts after 5pm & weekends Office Phone:  [phone] Office Fax:  [phone]

## 2016-09-08 LAB — GLUCOSE, CAPILLARY: Glucose-Capillary: 258 mg/dL — ABNORMAL HIGH (ref 65–99)

## 2016-09-10 DIAGNOSIS — I69393 Ataxia following cerebral infarction: Secondary | ICD-10-CM | POA: Diagnosis not present

## 2016-09-10 DIAGNOSIS — M1990 Unspecified osteoarthritis, unspecified site: Secondary | ICD-10-CM | POA: Diagnosis not present

## 2016-09-10 DIAGNOSIS — E1142 Type 2 diabetes mellitus with diabetic polyneuropathy: Secondary | ICD-10-CM | POA: Diagnosis not present

## 2016-09-10 DIAGNOSIS — S2232XD Fracture of one rib, left side, subsequent encounter for fracture with routine healing: Secondary | ICD-10-CM | POA: Diagnosis not present

## 2016-09-10 DIAGNOSIS — E1139 Type 2 diabetes mellitus with other diabetic ophthalmic complication: Secondary | ICD-10-CM | POA: Diagnosis not present

## 2016-09-10 DIAGNOSIS — F0391 Unspecified dementia with behavioral disturbance: Secondary | ICD-10-CM | POA: Diagnosis not present

## 2016-09-11 DIAGNOSIS — M1990 Unspecified osteoarthritis, unspecified site: Secondary | ICD-10-CM | POA: Diagnosis not present

## 2016-09-11 DIAGNOSIS — S2232XD Fracture of one rib, left side, subsequent encounter for fracture with routine healing: Secondary | ICD-10-CM | POA: Diagnosis not present

## 2016-09-11 DIAGNOSIS — E1139 Type 2 diabetes mellitus with other diabetic ophthalmic complication: Secondary | ICD-10-CM | POA: Diagnosis not present

## 2016-09-11 DIAGNOSIS — E1142 Type 2 diabetes mellitus with diabetic polyneuropathy: Secondary | ICD-10-CM | POA: Diagnosis not present

## 2016-09-11 DIAGNOSIS — I69393 Ataxia following cerebral infarction: Secondary | ICD-10-CM | POA: Diagnosis not present

## 2016-09-11 DIAGNOSIS — F0391 Unspecified dementia with behavioral disturbance: Secondary | ICD-10-CM | POA: Diagnosis not present

## 2016-09-13 DIAGNOSIS — F0391 Unspecified dementia with behavioral disturbance: Secondary | ICD-10-CM | POA: Diagnosis not present

## 2016-09-13 DIAGNOSIS — M1990 Unspecified osteoarthritis, unspecified site: Secondary | ICD-10-CM | POA: Diagnosis not present

## 2016-09-13 DIAGNOSIS — E1142 Type 2 diabetes mellitus with diabetic polyneuropathy: Secondary | ICD-10-CM | POA: Diagnosis not present

## 2016-09-13 DIAGNOSIS — E1139 Type 2 diabetes mellitus with other diabetic ophthalmic complication: Secondary | ICD-10-CM | POA: Diagnosis not present

## 2016-09-13 DIAGNOSIS — I69393 Ataxia following cerebral infarction: Secondary | ICD-10-CM | POA: Diagnosis not present

## 2016-09-13 DIAGNOSIS — S2232XD Fracture of one rib, left side, subsequent encounter for fracture with routine healing: Secondary | ICD-10-CM | POA: Diagnosis not present

## 2016-09-15 ENCOUNTER — Telehealth: Payer: Self-pay | Admitting: Internal Medicine

## 2016-09-15 ENCOUNTER — Telehealth: Payer: Self-pay | Admitting: *Deleted

## 2016-09-15 DIAGNOSIS — I69393 Ataxia following cerebral infarction: Secondary | ICD-10-CM | POA: Diagnosis not present

## 2016-09-15 DIAGNOSIS — F0391 Unspecified dementia with behavioral disturbance: Secondary | ICD-10-CM | POA: Diagnosis not present

## 2016-09-15 DIAGNOSIS — E1142 Type 2 diabetes mellitus with diabetic polyneuropathy: Secondary | ICD-10-CM | POA: Diagnosis not present

## 2016-09-15 DIAGNOSIS — M1990 Unspecified osteoarthritis, unspecified site: Secondary | ICD-10-CM | POA: Diagnosis not present

## 2016-09-15 DIAGNOSIS — E1139 Type 2 diabetes mellitus with other diabetic ophthalmic complication: Secondary | ICD-10-CM | POA: Diagnosis not present

## 2016-09-15 DIAGNOSIS — S2232XD Fracture of one rib, left side, subsequent encounter for fracture with routine healing: Secondary | ICD-10-CM | POA: Diagnosis not present

## 2016-09-15 NOTE — Telephone Encounter (Signed)
Called [facility] and left message at number listed below, per PCP note.  Orders are to be given by MD at the facility until followed by PCP after discharge.

## 2016-09-15 NOTE — Telephone Encounter (Signed)
Sandra Kaufman [id number] called from [facility] regarding request for a verbal order to see pt for OT for twice a week for 6 weeks. Msg can be left. Thank you!

## 2016-09-15 NOTE — Telephone Encounter (Signed)
Called number listed for Sandra Kaufman at [facility].  No answer, left message Dr. Nicki Reaper does NOT authorize an order for OT.  Physician currently caring for patient at the nursing facilty needs to give the initial OT order.  Dr.Scott will follow once she is discharged home.

## 2016-09-15 NOTE — Telephone Encounter (Signed)
Jenny Reichmann from [facility] has requested to have verbal orders for home health. She requested 1 time this week, 3 times a week for 1 week and 2 times a week for 4 weeks .  [employer] [phone]

## 2016-09-15 NOTE — Telephone Encounter (Signed)
Pt's son dropped off papers for [facility] to be completed by Dr. Nicki Reaper. Papers are up front in color folder.

## 2016-09-15 NOTE — Telephone Encounter (Signed)
Jenny Reichmann from [facility] was notified regarding the verbal orders for OT to be started by rounding physician and PCP to follow once discharged.  How would you like to proceed with request for [facility] orders?  See previous message.

## 2016-09-15 NOTE — Telephone Encounter (Signed)
If I am following her, then I am ok to give orders.  If she is being followed by another md or is in a facility, they need to give orders.  Let me know if any problems.

## 2016-09-16 NOTE — Telephone Encounter (Addendum)
Spoke with Home health and initial orders for OT and HH were administered by Dr.Anderson.  Pt is now at home and the verbal orders are requested as an extension prior to admittance to [facility].  Awaiting call back from son for clarification as to how we are to proceed regarding a follow up appointment with PCP and extending verbal orders.

## 2016-09-16 NOTE — Telephone Encounter (Signed)
Received message from son (Charles/Chuck).  Stated he dropped off FL2 forms for [employer] regarding his mothers care.

## 2016-09-16 NOTE — Telephone Encounter (Signed)
Ok to give home health - ok for orders.  Thanks

## 2016-09-16 NOTE — Telephone Encounter (Signed)
Pt is currently back home, since 09/11 from [facility]

## 2016-09-16 NOTE — Telephone Encounter (Signed)
FYI. Please advise.

## 2016-09-16 NOTE — Telephone Encounter (Signed)
I am ok to continue home health until decision made.  Ok order for home health.

## 2016-09-16 NOTE — Telephone Encounter (Signed)
Verbal given 

## 2016-09-17 DIAGNOSIS — F0391 Unspecified dementia with behavioral disturbance: Secondary | ICD-10-CM | POA: Diagnosis not present

## 2016-09-17 DIAGNOSIS — E1142 Type 2 diabetes mellitus with diabetic polyneuropathy: Secondary | ICD-10-CM | POA: Diagnosis not present

## 2016-09-17 DIAGNOSIS — S2232XD Fracture of one rib, left side, subsequent encounter for fracture with routine healing: Secondary | ICD-10-CM | POA: Diagnosis not present

## 2016-09-17 DIAGNOSIS — M1990 Unspecified osteoarthritis, unspecified site: Secondary | ICD-10-CM | POA: Diagnosis not present

## 2016-09-17 DIAGNOSIS — I69393 Ataxia following cerebral infarction: Secondary | ICD-10-CM | POA: Diagnosis not present

## 2016-09-17 DIAGNOSIS — E1139 Type 2 diabetes mellitus with other diabetic ophthalmic complication: Secondary | ICD-10-CM | POA: Diagnosis not present

## 2016-09-18 DIAGNOSIS — M1990 Unspecified osteoarthritis, unspecified site: Secondary | ICD-10-CM | POA: Diagnosis not present

## 2016-09-18 DIAGNOSIS — E1142 Type 2 diabetes mellitus with diabetic polyneuropathy: Secondary | ICD-10-CM | POA: Diagnosis not present

## 2016-09-18 DIAGNOSIS — F0391 Unspecified dementia with behavioral disturbance: Secondary | ICD-10-CM | POA: Diagnosis not present

## 2016-09-18 DIAGNOSIS — E1139 Type 2 diabetes mellitus with other diabetic ophthalmic complication: Secondary | ICD-10-CM | POA: Diagnosis not present

## 2016-09-18 DIAGNOSIS — I69393 Ataxia following cerebral infarction: Secondary | ICD-10-CM | POA: Diagnosis not present

## 2016-09-18 DIAGNOSIS — S2232XD Fracture of one rib, left side, subsequent encounter for fracture with routine healing: Secondary | ICD-10-CM | POA: Diagnosis not present

## 2016-09-21 ENCOUNTER — Telehealth: Payer: Self-pay | Admitting: Internal Medicine

## 2016-09-21 NOTE — Telephone Encounter (Signed)
Have they been completed? If so, please call pt's son.

## 2016-09-21 NOTE — Telephone Encounter (Signed)
Pt son called regarding papers that he dropped off that needed to be completed. Was wondering if they have been completed, is looking to admit her in to [facility] on Wednesday.  Call son [name] patient  [phone] (cell) [phone] (home) you can leave a message.

## 2016-09-21 NOTE — Telephone Encounter (Signed)
Called chuck and informed him that the patient will need a TB skin test, forms are ready otherwise. He states he will call [facility] to see if they can admit her without one and will call back

## 2016-09-22 ENCOUNTER — Ambulatory Visit (INDEPENDENT_AMBULATORY_CARE_PROVIDER_SITE_OTHER): Payer: Medicare Other

## 2016-09-22 DIAGNOSIS — Z111 Encounter for screening for respiratory tuberculosis: Secondary | ICD-10-CM

## 2016-09-22 NOTE — Telephone Encounter (Signed)
FYI

## 2016-09-22 NOTE — Progress Notes (Addendum)
Patient came in for TB placement, placed in Left arm intradermally.   Reviewed.  Dr Nicki Reaper

## 2016-09-22 NOTE — Telephone Encounter (Signed)
Pt son called back and scheduled Mom for tb test for this afternoon. Thank you!

## 2016-09-23 DIAGNOSIS — E1139 Type 2 diabetes mellitus with other diabetic ophthalmic complication: Secondary | ICD-10-CM | POA: Diagnosis not present

## 2016-09-23 DIAGNOSIS — I69393 Ataxia following cerebral infarction: Secondary | ICD-10-CM | POA: Diagnosis not present

## 2016-09-23 DIAGNOSIS — M1990 Unspecified osteoarthritis, unspecified site: Secondary | ICD-10-CM | POA: Diagnosis not present

## 2016-09-23 DIAGNOSIS — E1142 Type 2 diabetes mellitus with diabetic polyneuropathy: Secondary | ICD-10-CM | POA: Diagnosis not present

## 2016-09-23 DIAGNOSIS — F0391 Unspecified dementia with behavioral disturbance: Secondary | ICD-10-CM | POA: Diagnosis not present

## 2016-09-23 DIAGNOSIS — S2232XD Fracture of one rib, left side, subsequent encounter for fracture with routine healing: Secondary | ICD-10-CM | POA: Diagnosis not present

## 2016-09-24 ENCOUNTER — Ambulatory Visit (INDEPENDENT_AMBULATORY_CARE_PROVIDER_SITE_OTHER): Payer: Medicare Other

## 2016-09-24 DIAGNOSIS — E1142 Type 2 diabetes mellitus with diabetic polyneuropathy: Secondary | ICD-10-CM | POA: Diagnosis not present

## 2016-09-24 DIAGNOSIS — Z111 Encounter for screening for respiratory tuberculosis: Secondary | ICD-10-CM

## 2016-09-24 DIAGNOSIS — Z7689 Persons encountering health services in other specified circumstances: Secondary | ICD-10-CM

## 2016-09-24 DIAGNOSIS — F0391 Unspecified dementia with behavioral disturbance: Secondary | ICD-10-CM | POA: Diagnosis not present

## 2016-09-24 DIAGNOSIS — E1139 Type 2 diabetes mellitus with other diabetic ophthalmic complication: Secondary | ICD-10-CM | POA: Diagnosis not present

## 2016-09-24 DIAGNOSIS — I69393 Ataxia following cerebral infarction: Secondary | ICD-10-CM | POA: Diagnosis not present

## 2016-09-24 DIAGNOSIS — M1990 Unspecified osteoarthritis, unspecified site: Secondary | ICD-10-CM | POA: Diagnosis not present

## 2016-09-24 DIAGNOSIS — S2232XD Fracture of one rib, left side, subsequent encounter for fracture with routine healing: Secondary | ICD-10-CM | POA: Diagnosis not present

## 2016-09-24 LAB — TB SKIN TEST
Induration: 0 mm
TB Skin Test: NEGATIVE

## 2016-09-24 NOTE — Progress Notes (Signed)
There is not a not for me to sign.  Let me know if I need to do something.

## 2016-09-28 DIAGNOSIS — I69393 Ataxia following cerebral infarction: Secondary | ICD-10-CM | POA: Diagnosis not present

## 2016-09-28 DIAGNOSIS — E1142 Type 2 diabetes mellitus with diabetic polyneuropathy: Secondary | ICD-10-CM | POA: Diagnosis not present

## 2016-09-28 DIAGNOSIS — E1139 Type 2 diabetes mellitus with other diabetic ophthalmic complication: Secondary | ICD-10-CM | POA: Diagnosis not present

## 2016-09-28 DIAGNOSIS — F0391 Unspecified dementia with behavioral disturbance: Secondary | ICD-10-CM | POA: Diagnosis not present

## 2016-09-28 DIAGNOSIS — S2232XD Fracture of one rib, left side, subsequent encounter for fracture with routine healing: Secondary | ICD-10-CM | POA: Diagnosis not present

## 2016-09-28 DIAGNOSIS — M1990 Unspecified osteoarthritis, unspecified site: Secondary | ICD-10-CM | POA: Diagnosis not present

## 2016-10-01 DIAGNOSIS — E785 Hyperlipidemia, unspecified: Secondary | ICD-10-CM | POA: Diagnosis not present

## 2016-10-01 DIAGNOSIS — Z79899 Other long term (current) drug therapy: Secondary | ICD-10-CM | POA: Diagnosis not present

## 2016-10-06 DIAGNOSIS — E785 Hyperlipidemia, unspecified: Secondary | ICD-10-CM | POA: Diagnosis not present

## 2016-10-06 DIAGNOSIS — D519 Vitamin B12 deficiency anemia, unspecified: Secondary | ICD-10-CM | POA: Diagnosis not present

## 2016-10-06 DIAGNOSIS — G309 Alzheimer's disease, unspecified: Secondary | ICD-10-CM | POA: Diagnosis not present

## 2016-10-06 DIAGNOSIS — Z79899 Other long term (current) drug therapy: Secondary | ICD-10-CM | POA: Diagnosis not present

## 2016-10-06 DIAGNOSIS — E039 Hypothyroidism, unspecified: Secondary | ICD-10-CM | POA: Diagnosis not present

## 2016-11-10 DIAGNOSIS — G309 Alzheimer's disease, unspecified: Secondary | ICD-10-CM | POA: Diagnosis not present

## 2016-11-10 DIAGNOSIS — R634 Abnormal weight loss: Secondary | ICD-10-CM | POA: Diagnosis not present

## 2016-11-10 DIAGNOSIS — Z79899 Other long term (current) drug therapy: Secondary | ICD-10-CM | POA: Diagnosis not present

## 2016-11-10 DIAGNOSIS — E119 Type 2 diabetes mellitus without complications: Secondary | ICD-10-CM | POA: Diagnosis not present

## 2016-11-10 DIAGNOSIS — F329 Major depressive disorder, single episode, unspecified: Secondary | ICD-10-CM | POA: Diagnosis not present

## 2016-11-10 DIAGNOSIS — J449 Chronic obstructive pulmonary disease, unspecified: Secondary | ICD-10-CM | POA: Diagnosis not present

## 2016-11-10 DIAGNOSIS — R197 Diarrhea, unspecified: Secondary | ICD-10-CM | POA: Diagnosis not present

## 2016-11-12 DIAGNOSIS — I10 Essential (primary) hypertension: Secondary | ICD-10-CM | POA: Diagnosis not present

## 2016-11-12 DIAGNOSIS — R296 Repeated falls: Secondary | ICD-10-CM | POA: Diagnosis not present

## 2016-11-12 DIAGNOSIS — E039 Hypothyroidism, unspecified: Secondary | ICD-10-CM | POA: Diagnosis not present

## 2016-11-12 DIAGNOSIS — J449 Chronic obstructive pulmonary disease, unspecified: Secondary | ICD-10-CM | POA: Diagnosis not present

## 2016-11-12 DIAGNOSIS — Z8673 Personal history of transient ischemic attack (TIA), and cerebral infarction without residual deficits: Secondary | ICD-10-CM | POA: Diagnosis not present

## 2016-11-12 DIAGNOSIS — G629 Polyneuropathy, unspecified: Secondary | ICD-10-CM | POA: Diagnosis not present

## 2016-11-12 DIAGNOSIS — R413 Other amnesia: Secondary | ICD-10-CM | POA: Diagnosis not present

## 2016-11-12 DIAGNOSIS — E785 Hyperlipidemia, unspecified: Secondary | ICD-10-CM | POA: Diagnosis not present

## 2016-11-12 DIAGNOSIS — Z9181 History of falling: Secondary | ICD-10-CM | POA: Diagnosis not present

## 2016-11-12 DIAGNOSIS — H409 Unspecified glaucoma: Secondary | ICD-10-CM | POA: Diagnosis not present

## 2016-11-17 DIAGNOSIS — R296 Repeated falls: Secondary | ICD-10-CM | POA: Diagnosis not present

## 2016-11-17 DIAGNOSIS — R413 Other amnesia: Secondary | ICD-10-CM | POA: Diagnosis not present

## 2016-11-17 DIAGNOSIS — R269 Unspecified abnormalities of gait and mobility: Secondary | ICD-10-CM | POA: Diagnosis not present

## 2016-11-17 DIAGNOSIS — E785 Hyperlipidemia, unspecified: Secondary | ICD-10-CM | POA: Diagnosis not present

## 2016-11-17 DIAGNOSIS — E119 Type 2 diabetes mellitus without complications: Secondary | ICD-10-CM | POA: Diagnosis not present

## 2016-11-17 DIAGNOSIS — I10 Essential (primary) hypertension: Secondary | ICD-10-CM | POA: Diagnosis not present

## 2016-11-17 DIAGNOSIS — G629 Polyneuropathy, unspecified: Secondary | ICD-10-CM | POA: Diagnosis not present

## 2016-11-17 DIAGNOSIS — Z79899 Other long term (current) drug therapy: Secondary | ICD-10-CM | POA: Diagnosis not present

## 2016-11-17 DIAGNOSIS — J449 Chronic obstructive pulmonary disease, unspecified: Secondary | ICD-10-CM | POA: Diagnosis not present

## 2016-11-17 DIAGNOSIS — R197 Diarrhea, unspecified: Secondary | ICD-10-CM | POA: Diagnosis not present

## 2016-11-17 DIAGNOSIS — K219 Gastro-esophageal reflux disease without esophagitis: Secondary | ICD-10-CM | POA: Diagnosis not present

## 2016-11-17 DIAGNOSIS — J309 Allergic rhinitis, unspecified: Secondary | ICD-10-CM | POA: Diagnosis not present

## 2016-11-18 DIAGNOSIS — G629 Polyneuropathy, unspecified: Secondary | ICD-10-CM | POA: Diagnosis not present

## 2016-11-18 DIAGNOSIS — R296 Repeated falls: Secondary | ICD-10-CM | POA: Diagnosis not present

## 2016-11-18 DIAGNOSIS — J449 Chronic obstructive pulmonary disease, unspecified: Secondary | ICD-10-CM | POA: Diagnosis not present

## 2016-11-18 DIAGNOSIS — E785 Hyperlipidemia, unspecified: Secondary | ICD-10-CM | POA: Diagnosis not present

## 2016-11-18 DIAGNOSIS — I10 Essential (primary) hypertension: Secondary | ICD-10-CM | POA: Diagnosis not present

## 2016-11-18 DIAGNOSIS — R413 Other amnesia: Secondary | ICD-10-CM | POA: Diagnosis not present

## 2016-11-20 DIAGNOSIS — I10 Essential (primary) hypertension: Secondary | ICD-10-CM | POA: Diagnosis not present

## 2016-11-20 DIAGNOSIS — J449 Chronic obstructive pulmonary disease, unspecified: Secondary | ICD-10-CM | POA: Diagnosis not present

## 2016-11-20 DIAGNOSIS — R296 Repeated falls: Secondary | ICD-10-CM | POA: Diagnosis not present

## 2016-11-20 DIAGNOSIS — G629 Polyneuropathy, unspecified: Secondary | ICD-10-CM | POA: Diagnosis not present

## 2016-11-20 DIAGNOSIS — E785 Hyperlipidemia, unspecified: Secondary | ICD-10-CM | POA: Diagnosis not present

## 2016-11-20 DIAGNOSIS — R413 Other amnesia: Secondary | ICD-10-CM | POA: Diagnosis not present

## 2016-11-24 DIAGNOSIS — G629 Polyneuropathy, unspecified: Secondary | ICD-10-CM | POA: Diagnosis not present

## 2016-11-24 DIAGNOSIS — J449 Chronic obstructive pulmonary disease, unspecified: Secondary | ICD-10-CM | POA: Diagnosis not present

## 2016-11-24 DIAGNOSIS — I10 Essential (primary) hypertension: Secondary | ICD-10-CM | POA: Diagnosis not present

## 2016-11-24 DIAGNOSIS — Z111 Encounter for screening for respiratory tuberculosis: Secondary | ICD-10-CM | POA: Diagnosis not present

## 2016-11-24 DIAGNOSIS — R413 Other amnesia: Secondary | ICD-10-CM | POA: Diagnosis not present

## 2016-11-24 DIAGNOSIS — R296 Repeated falls: Secondary | ICD-10-CM | POA: Diagnosis not present

## 2016-11-24 DIAGNOSIS — E785 Hyperlipidemia, unspecified: Secondary | ICD-10-CM | POA: Diagnosis not present

## 2016-11-26 DIAGNOSIS — I10 Essential (primary) hypertension: Secondary | ICD-10-CM | POA: Diagnosis not present

## 2016-11-26 DIAGNOSIS — G629 Polyneuropathy, unspecified: Secondary | ICD-10-CM | POA: Diagnosis not present

## 2016-11-26 DIAGNOSIS — R296 Repeated falls: Secondary | ICD-10-CM | POA: Diagnosis not present

## 2016-11-26 DIAGNOSIS — R413 Other amnesia: Secondary | ICD-10-CM | POA: Diagnosis not present

## 2016-11-26 DIAGNOSIS — J449 Chronic obstructive pulmonary disease, unspecified: Secondary | ICD-10-CM | POA: Diagnosis not present

## 2016-11-26 DIAGNOSIS — E785 Hyperlipidemia, unspecified: Secondary | ICD-10-CM | POA: Diagnosis not present

## 2016-11-27 DIAGNOSIS — R413 Other amnesia: Secondary | ICD-10-CM | POA: Diagnosis not present

## 2016-11-27 DIAGNOSIS — E785 Hyperlipidemia, unspecified: Secondary | ICD-10-CM | POA: Diagnosis not present

## 2016-11-27 DIAGNOSIS — G629 Polyneuropathy, unspecified: Secondary | ICD-10-CM | POA: Diagnosis not present

## 2016-11-27 DIAGNOSIS — J449 Chronic obstructive pulmonary disease, unspecified: Secondary | ICD-10-CM | POA: Diagnosis not present

## 2016-11-27 DIAGNOSIS — I10 Essential (primary) hypertension: Secondary | ICD-10-CM | POA: Diagnosis not present

## 2016-11-27 DIAGNOSIS — R296 Repeated falls: Secondary | ICD-10-CM | POA: Diagnosis not present

## 2016-12-01 DIAGNOSIS — G629 Polyneuropathy, unspecified: Secondary | ICD-10-CM | POA: Diagnosis not present

## 2016-12-01 DIAGNOSIS — R296 Repeated falls: Secondary | ICD-10-CM | POA: Diagnosis not present

## 2016-12-01 DIAGNOSIS — R413 Other amnesia: Secondary | ICD-10-CM | POA: Diagnosis not present

## 2016-12-01 DIAGNOSIS — J449 Chronic obstructive pulmonary disease, unspecified: Secondary | ICD-10-CM | POA: Diagnosis not present

## 2016-12-01 DIAGNOSIS — I10 Essential (primary) hypertension: Secondary | ICD-10-CM | POA: Diagnosis not present

## 2016-12-01 DIAGNOSIS — E785 Hyperlipidemia, unspecified: Secondary | ICD-10-CM | POA: Diagnosis not present

## 2016-12-03 DIAGNOSIS — R413 Other amnesia: Secondary | ICD-10-CM | POA: Diagnosis not present

## 2016-12-03 DIAGNOSIS — I10 Essential (primary) hypertension: Secondary | ICD-10-CM | POA: Diagnosis not present

## 2016-12-03 DIAGNOSIS — E785 Hyperlipidemia, unspecified: Secondary | ICD-10-CM | POA: Diagnosis not present

## 2016-12-03 DIAGNOSIS — R296 Repeated falls: Secondary | ICD-10-CM | POA: Diagnosis not present

## 2016-12-03 DIAGNOSIS — J449 Chronic obstructive pulmonary disease, unspecified: Secondary | ICD-10-CM | POA: Diagnosis not present

## 2016-12-03 DIAGNOSIS — G629 Polyneuropathy, unspecified: Secondary | ICD-10-CM | POA: Diagnosis not present

## 2016-12-05 DIAGNOSIS — R413 Other amnesia: Secondary | ICD-10-CM | POA: Diagnosis not present

## 2016-12-05 DIAGNOSIS — G629 Polyneuropathy, unspecified: Secondary | ICD-10-CM | POA: Diagnosis not present

## 2016-12-05 DIAGNOSIS — I10 Essential (primary) hypertension: Secondary | ICD-10-CM | POA: Diagnosis not present

## 2016-12-05 DIAGNOSIS — E785 Hyperlipidemia, unspecified: Secondary | ICD-10-CM | POA: Diagnosis not present

## 2016-12-05 DIAGNOSIS — J449 Chronic obstructive pulmonary disease, unspecified: Secondary | ICD-10-CM | POA: Diagnosis not present

## 2016-12-05 DIAGNOSIS — R296 Repeated falls: Secondary | ICD-10-CM | POA: Diagnosis not present

## 2016-12-08 DIAGNOSIS — R296 Repeated falls: Secondary | ICD-10-CM | POA: Diagnosis not present

## 2016-12-08 DIAGNOSIS — J449 Chronic obstructive pulmonary disease, unspecified: Secondary | ICD-10-CM | POA: Diagnosis not present

## 2016-12-08 DIAGNOSIS — I10 Essential (primary) hypertension: Secondary | ICD-10-CM | POA: Diagnosis not present

## 2016-12-08 DIAGNOSIS — R413 Other amnesia: Secondary | ICD-10-CM | POA: Diagnosis not present

## 2016-12-08 DIAGNOSIS — E785 Hyperlipidemia, unspecified: Secondary | ICD-10-CM | POA: Diagnosis not present

## 2016-12-08 DIAGNOSIS — G629 Polyneuropathy, unspecified: Secondary | ICD-10-CM | POA: Diagnosis not present

## 2016-12-08 DIAGNOSIS — F331 Major depressive disorder, recurrent, moderate: Secondary | ICD-10-CM | POA: Diagnosis not present

## 2016-12-10 DIAGNOSIS — I10 Essential (primary) hypertension: Secondary | ICD-10-CM | POA: Diagnosis not present

## 2016-12-10 DIAGNOSIS — E785 Hyperlipidemia, unspecified: Secondary | ICD-10-CM | POA: Diagnosis not present

## 2016-12-10 DIAGNOSIS — R296 Repeated falls: Secondary | ICD-10-CM | POA: Diagnosis not present

## 2016-12-10 DIAGNOSIS — R413 Other amnesia: Secondary | ICD-10-CM | POA: Diagnosis not present

## 2016-12-10 DIAGNOSIS — J449 Chronic obstructive pulmonary disease, unspecified: Secondary | ICD-10-CM | POA: Diagnosis not present

## 2016-12-10 DIAGNOSIS — G629 Polyneuropathy, unspecified: Secondary | ICD-10-CM | POA: Diagnosis not present

## 2016-12-15 DIAGNOSIS — G629 Polyneuropathy, unspecified: Secondary | ICD-10-CM | POA: Diagnosis not present

## 2016-12-15 DIAGNOSIS — R413 Other amnesia: Secondary | ICD-10-CM | POA: Diagnosis not present

## 2016-12-15 DIAGNOSIS — R635 Abnormal weight gain: Secondary | ICD-10-CM | POA: Diagnosis not present

## 2016-12-15 DIAGNOSIS — S7002XA Contusion of left hip, initial encounter: Secondary | ICD-10-CM | POA: Diagnosis not present

## 2016-12-15 DIAGNOSIS — Z79899 Other long term (current) drug therapy: Secondary | ICD-10-CM | POA: Diagnosis not present

## 2016-12-15 DIAGNOSIS — I10 Essential (primary) hypertension: Secondary | ICD-10-CM | POA: Diagnosis not present

## 2016-12-15 DIAGNOSIS — E785 Hyperlipidemia, unspecified: Secondary | ICD-10-CM | POA: Diagnosis not present

## 2016-12-15 DIAGNOSIS — R269 Unspecified abnormalities of gait and mobility: Secondary | ICD-10-CM | POA: Diagnosis not present

## 2016-12-15 DIAGNOSIS — E668 Other obesity: Secondary | ICD-10-CM | POA: Diagnosis not present

## 2016-12-15 DIAGNOSIS — R296 Repeated falls: Secondary | ICD-10-CM | POA: Diagnosis not present

## 2016-12-15 DIAGNOSIS — W19XXXA Unspecified fall, initial encounter: Secondary | ICD-10-CM | POA: Diagnosis not present

## 2016-12-15 DIAGNOSIS — E119 Type 2 diabetes mellitus without complications: Secondary | ICD-10-CM | POA: Diagnosis not present

## 2016-12-15 DIAGNOSIS — J449 Chronic obstructive pulmonary disease, unspecified: Secondary | ICD-10-CM | POA: Diagnosis not present

## 2016-12-15 DIAGNOSIS — E039 Hypothyroidism, unspecified: Secondary | ICD-10-CM | POA: Diagnosis not present

## 2016-12-17 DIAGNOSIS — J449 Chronic obstructive pulmonary disease, unspecified: Secondary | ICD-10-CM | POA: Diagnosis not present

## 2016-12-17 DIAGNOSIS — I10 Essential (primary) hypertension: Secondary | ICD-10-CM | POA: Diagnosis not present

## 2016-12-17 DIAGNOSIS — R413 Other amnesia: Secondary | ICD-10-CM | POA: Diagnosis not present

## 2016-12-17 DIAGNOSIS — E785 Hyperlipidemia, unspecified: Secondary | ICD-10-CM | POA: Diagnosis not present

## 2016-12-17 DIAGNOSIS — G629 Polyneuropathy, unspecified: Secondary | ICD-10-CM | POA: Diagnosis not present

## 2016-12-17 DIAGNOSIS — R296 Repeated falls: Secondary | ICD-10-CM | POA: Diagnosis not present

## 2016-12-22 DIAGNOSIS — E785 Hyperlipidemia, unspecified: Secondary | ICD-10-CM | POA: Diagnosis not present

## 2016-12-22 DIAGNOSIS — R296 Repeated falls: Secondary | ICD-10-CM | POA: Diagnosis not present

## 2016-12-22 DIAGNOSIS — J449 Chronic obstructive pulmonary disease, unspecified: Secondary | ICD-10-CM | POA: Diagnosis not present

## 2016-12-22 DIAGNOSIS — R413 Other amnesia: Secondary | ICD-10-CM | POA: Diagnosis not present

## 2016-12-22 DIAGNOSIS — G629 Polyneuropathy, unspecified: Secondary | ICD-10-CM | POA: Diagnosis not present

## 2016-12-22 DIAGNOSIS — I10 Essential (primary) hypertension: Secondary | ICD-10-CM | POA: Diagnosis not present

## 2016-12-24 DIAGNOSIS — J449 Chronic obstructive pulmonary disease, unspecified: Secondary | ICD-10-CM | POA: Diagnosis not present

## 2016-12-24 DIAGNOSIS — R413 Other amnesia: Secondary | ICD-10-CM | POA: Diagnosis not present

## 2016-12-24 DIAGNOSIS — E785 Hyperlipidemia, unspecified: Secondary | ICD-10-CM | POA: Diagnosis not present

## 2016-12-24 DIAGNOSIS — I10 Essential (primary) hypertension: Secondary | ICD-10-CM | POA: Diagnosis not present

## 2016-12-24 DIAGNOSIS — G629 Polyneuropathy, unspecified: Secondary | ICD-10-CM | POA: Diagnosis not present

## 2016-12-24 DIAGNOSIS — R296 Repeated falls: Secondary | ICD-10-CM | POA: Diagnosis not present

## 2016-12-25 DIAGNOSIS — R296 Repeated falls: Secondary | ICD-10-CM | POA: Diagnosis not present

## 2016-12-25 DIAGNOSIS — J449 Chronic obstructive pulmonary disease, unspecified: Secondary | ICD-10-CM | POA: Diagnosis not present

## 2016-12-25 DIAGNOSIS — E785 Hyperlipidemia, unspecified: Secondary | ICD-10-CM | POA: Diagnosis not present

## 2016-12-25 DIAGNOSIS — I10 Essential (primary) hypertension: Secondary | ICD-10-CM | POA: Diagnosis not present

## 2016-12-25 DIAGNOSIS — R413 Other amnesia: Secondary | ICD-10-CM | POA: Diagnosis not present

## 2016-12-25 DIAGNOSIS — G629 Polyneuropathy, unspecified: Secondary | ICD-10-CM | POA: Diagnosis not present

## 2017-01-05 DIAGNOSIS — G309 Alzheimer's disease, unspecified: Secondary | ICD-10-CM | POA: Diagnosis not present

## 2017-01-05 DIAGNOSIS — R9431 Abnormal electrocardiogram [ECG] [EKG]: Secondary | ICD-10-CM | POA: Diagnosis not present

## 2017-01-05 DIAGNOSIS — Z79899 Other long term (current) drug therapy: Secondary | ICD-10-CM | POA: Diagnosis not present

## 2017-01-05 DIAGNOSIS — I10 Essential (primary) hypertension: Secondary | ICD-10-CM | POA: Diagnosis not present

## 2017-01-05 DIAGNOSIS — R634 Abnormal weight loss: Secondary | ICD-10-CM | POA: Diagnosis not present

## 2017-01-05 DIAGNOSIS — E119 Type 2 diabetes mellitus without complications: Secondary | ICD-10-CM | POA: Diagnosis not present

## 2017-01-18 ENCOUNTER — Encounter: Payer: Self-pay | Admitting: Emergency Medicine

## 2017-01-18 ENCOUNTER — Emergency Department
Admission: EM | Admit: 2017-01-18 | Discharge: 2017-01-18 | Disposition: A | Discharge status: Home / Self Care | Payer: Medicare Other | Attending: Emergency Medicine | Admitting: Emergency Medicine

## 2017-01-18 DIAGNOSIS — I10 Essential (primary) hypertension: Secondary | ICD-10-CM | POA: Diagnosis not present

## 2017-01-18 DIAGNOSIS — S0993XA Unspecified injury of face, initial encounter: Secondary | ICD-10-CM | POA: Diagnosis not present

## 2017-01-18 DIAGNOSIS — W010XXA Fall on same level from slipping, tripping and stumbling without subsequent striking against object, initial encounter: Secondary | ICD-10-CM | POA: Insufficient documentation

## 2017-01-18 DIAGNOSIS — W19XXXA Unspecified fall, initial encounter: Secondary | ICD-10-CM | POA: Diagnosis not present

## 2017-01-18 DIAGNOSIS — S0031XA Abrasion of nose, initial encounter: Secondary | ICD-10-CM | POA: Insufficient documentation

## 2017-01-18 DIAGNOSIS — E039 Hypothyroidism, unspecified: Secondary | ICD-10-CM | POA: Diagnosis not present

## 2017-01-18 DIAGNOSIS — Y939 Activity, unspecified: Secondary | ICD-10-CM | POA: Insufficient documentation

## 2017-01-18 DIAGNOSIS — Y999 Unspecified external cause status: Secondary | ICD-10-CM | POA: Diagnosis not present

## 2017-01-18 DIAGNOSIS — S00511A Abrasion of lip, initial encounter: Secondary | ICD-10-CM | POA: Insufficient documentation

## 2017-01-18 DIAGNOSIS — Z85828 Personal history of other malignant neoplasm of skin: Secondary | ICD-10-CM | POA: Insufficient documentation

## 2017-01-18 DIAGNOSIS — J449 Chronic obstructive pulmonary disease, unspecified: Secondary | ICD-10-CM | POA: Diagnosis not present

## 2017-01-18 DIAGNOSIS — Y92009 Unspecified place in unspecified non-institutional (private) residence as the place of occurrence of the external cause: Secondary | ICD-10-CM | POA: Diagnosis not present

## 2017-01-18 HISTORY — DX: Repeated falls: R29.6

## 2017-01-18 HISTORY — DX: Personal history of transient ischemic attack (TIA), and cerebral infarction without residual deficits: Z86.73

## 2017-01-18 NOTE — ED Notes (Addendum)
Spoke with Deneise Lever at [facility], facility arranging transport at this time. Bringing pt. Wheelchair from facility.

## 2017-01-18 NOTE — ED Provider Notes (Signed)
[facility] Emergency Department Provider Note  ____________________________________________  Time seen: Approximately 8:24 PM  I have reviewed the triage vital signs and the nursing notes.   HISTORY  Chief Complaint Fall    HPI Sandra Kaufman is a 83 y.o. female presenting to the emergency department after a fall in her home this evening. Patient states that she was walking across the floor when her feet "gave out". Patient states that she fell face first. Patient noticed a mild episode of epistaxis that resolved prior to presenting to the emergency department. She also has upper lip and nose abrasions. Patient denies headache, blurry vision, chest pain, shortness of breath, abdominal pain, nausea and vomiting. Patient denies upper or lower extremity pain. Patient lives in an assisted living facility. She is primarily independent. She enjoys square dancing for fun. She has taken Tylenol but has attempted no other alleviating measures. She states that her upper lip is sore but has no other complaints at this time. Tetanus status is up-to-date.   Past Medical History:  Diagnosis Date  . Arthritis   . Cancer ([place])    skin  . Colon polyps   . COPD (chronic obstructive pulmonary disease) ([place])   . Diverticulosis   . Frequent falls   . GERD (gastroesophageal reflux disease)    with esophageal stricture requiring dilatation x 2 (12/96 and 10/99)  . Glaucoma   . History of chicken pox   . History of CVA (cerebrovascular accident)   . Hypercholesterolemia   . Hypertension   . Hypothyroidism   . Migraines   . Peripheral neuropathy ([place])   . Urinary incontinence     Patient Active Problem List   Diagnosis Date Noted  . Ileus ([place]) 08/27/2016  . Multiple falls 07/22/2016  . Dementia 04/22/2016  . Fracture of femoral neck, left ([place]) 04/16/2016  . Fracture, femur, neck, left, closed, initial encounter 04/16/2016  . Vomiting 03/17/2016  . Rash 03/17/2016   . Diarrhea 02/18/2016  . Adult hypothyroidism 01/29/2016  . Fall 01/19/2016  . Anemia 10/20/2015  . Headache, migraine 10/10/2015  . HLD (hyperlipidemia) 10/10/2015  . Glaucoma 10/10/2015  . Chronic obstructive pulmonary disease ([place]) 10/10/2015  . Arthritis 10/10/2015  . Hypertrophic toenail 07/23/2015  . Health care maintenance 05/27/2015  . Difficulty in walking 10/04/2014  . Cerebral ataxia ([place]) 10/04/2014  . Unsteady gait 08/22/2014  . Obesity 08/22/2014  . Dysphagia 07/14/2014  . Amnesia 05/24/2014  . B12 deficiency 05/24/2014  . 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 SOB (shortness of breath) 05/06/2014  . CVA (cerebral vascular accident) ([place]) 03/04/2014  . Diverticulosis 04/29/2013  . GERD (gastroesophageal reflux disease) 11/27/2012  . Urinary incontinence 11/27/2012  . Ovarian cyst 11/27/2012  . Depression 11/27/2012  . Neuropathy ([place]) 11/27/2012  . Hypothyroidism 11/15/2012  . Hypercholesterolemia 11/15/2012  . Diabetes mellitus ([place]) 11/15/2012  . Hypertension 11/15/2012    Past Surgical History:  Procedure Laterality Date  . ABDOMINAL HYSTERECTOMY  1961   ovaries not removed  . APPENDECTOMY    . bladder tack  1976  . CATARACT EXTRACTION  1997   bilateral  . CHOLECYSTECTOMY    . HIP ARTHROPLASTY Left 04/16/2016   Procedure: ARTHROPLASTY BIPOLAR HIP (HEMIARTHROPLASTY);  Surgeon: Corky Mull, MD;  Location: [facility] ORS;  Service: Orthopedics;  Laterality: Left;  . INTERSTIM IMPLANT PLACEMENT  2011  . TONSILLECTOMY      Prior to Admission medications   Medication Sig Start Date End Date Taking? Authorizing Provider  albuterol (  PROVENTIL HFA;VENTOLIN HFA) 108 (90 BASE) MCG/ACT inhaler Inhale 2 puffs into the lungs every 6 (six) hours as needed for wheezing or shortness of breath. 06/14/15   Rubbie Battiest, NP  amLODipine (NORVASC) 5 MG tablet Take 1 tablet (5 mg total) by mouth daily. 04/23/16   Gladstone Lighter, MD  aspirin 325 MG tablet Take 325 mg by mouth  daily.    Historical Provider, MD  atorvastatin (LIPITOR) 40 MG tablet Take 40 mg by mouth at bedtime.    Historical Provider, MD  betamethasone valerate ointment (VALISONE) 0.1 % Apply 1 application topically 2 (two) times daily. Do not use for more than 2 weeks consecutively. 03/17/16   Jayce G Cook, DO  bimatoprost (LUMIGAN) 0.01 % SOLN Place 1 drop into both eyes at bedtime.    Historical Provider, MD  brinzolamide (AZOPT) 1 % ophthalmic suspension Place 1 drop into both eyes 2 (two) times daily.     Historical Provider, MD  Coenzyme Q10 (CO Q10) 100 MG CAPS Take 100 mg by mouth daily.     Historical Provider, MD  docusate sodium (COLACE) 100 MG capsule Take 1 capsule (100 mg total) by mouth 2 (two) times daily. 04/17/16   Srikar Sudini, MD  ertapenem 1 g in sodium chloride 0.9 % 50 mL Inject 1 g into the vein daily. 07/24/16   Loletha Grayer, MD  feeding supplement, ENSURE ENLIVE, (ENSURE ENLIVE) LIQD Take 237 mLs by mouth 2 (two) times daily between meals. 04/23/16   Gladstone Lighter, MD  fluticasone (FLONASE) 50 MCG/ACT nasal spray Place 2 sprays into both nostrils daily.    Historical Provider, MD  haloperidol (HALDOL) 2 MG tablet Take 2 mg by mouth 3 (three) times daily as needed. 07/26/16   Historical Provider, MD  HYDROcodone-acetaminophen (NORCO/VICODIN) 5-325 MG tablet  07/22/16   Historical Provider, MD  levothyroxine (SYNTHROID, LEVOTHROID) 100 MCG tablet Take 100 mcg by mouth daily before breakfast.    Historical Provider, MD  memantine (NAMENDA) 10 MG tablet Take 10 mg by mouth 2 (two) times daily.    Historical Provider, MD  metoprolol tartrate (LOPRESSOR) 25 MG tablet Take 1 tablet (25 mg total) by mouth 2 (two) times daily. 07/24/16   Loletha Grayer, MD  pantoprazole (PROTONIX) 40 MG tablet Take 1 tablet (40 mg total) by mouth daily. 03/09/16   Lucilla Lame, MD  psyllium (METAMUCIL) 0.52 G capsule Take 2.08 g by mouth 2 (two) times daily.     Historical Provider, MD  QUEtiapine  (SEROQUEL) 25 MG tablet Take 0.5 tablets (12.5 mg total) by mouth at bedtime. 07/24/16   Loletha Grayer, MD  sertraline (ZOLOFT) 100 MG tablet Take 100 mg by mouth daily.    Historical Provider, MD  triamcinolone cream (KENALOG) 0.1 % Apply 1 application topically 2 (two) times daily as needed (for itching).    Historical Provider, MD  vitamin B-12 (CYANOCOBALAMIN) 1000 MCG tablet Take 2,000 mcg by mouth daily.    Historical Provider, MD    Allergies Tramadol  Family History  Problem Relation Age of Onset  . Heart disease Father     myocardial infarction  . Arthritis/Rheumatoid Mother   . Diabetes Sister   . Epilepsy Sister   . Breast cancer Neg Hx   . Colon cancer Neg Hx     Social History Social History  Substance Use Topics  . Smoking status: Never Smoker  . Smokeless tobacco: Never Used  . Alcohol use No     Review of Systems  Constitutional: No fever/chills Eyes: No visual changes.  ENT: Patient had epistaxis and upper lip abrasion.  Cardiovascular: no chest pain. Respiratory: no cough. No SOB. Gastrointestinal: No abdominal pain.  No nausea, no vomiting.  No diarrhea.  No constipation. Musculoskeletal: Negative for musculoskeletal pain. Neurological: Negative for headaches, focal weakness or numbness. ____________________________________________   PHYSICAL EXAM:  VITAL SIGNS: ED Triage Vitals  Enc Vitals Group     BP 01/18/17 1909 (!) 154/78     Pulse Rate 01/18/17 1909 65     Resp 01/18/17 1909 18     Temp 01/18/17 1909 98.3 F (36.8 C)     Temp Source 01/18/17 1909 Oral     SpO2 01/18/17 1909 97 %     Weight 01/18/17 1910 175 lb (79.4 kg)     Height 01/18/17 1910 5\' 3"  (1.6 m)     Head Circumference --      Peak Flow --      Pain Score 01/18/17 1951 3     Pain Loc --      Pain Edu? --      Excl. in [place]? --     Constitutional: Alert and oriented. Patient is talkative and engaged.  Eyes: Palpebral and bulbar conjunctiva are nonerythematous  bilaterally. PERRL. EOMI.  Head: No palpable focal regions of edema. ENT:      Ears: Tympanic membranes are pearly bilaterally without bloody effusion. Bony landmarks are visualized bilaterally.       Nose: Nasal septum is midline. No evidence of septal hematoma. Trace blood visualized at nares bilaterally.      Mouth/Throat: Mucous membranes are moist. Posterior pharynx is nonerythematous. Uvula is midline. Neck: Full range of motion. No pain with neck flexion. Cardiovascular: No pain with palpation over the anterior and posterior chest wall. Normal rate, regular rhythm. Normal S1 and S2. No murmurs, gallops or rubs auscultated.  Respiratory: Trachea is midline. No retractions or presence of deformity. Thoracic expansion is symmetric. Resonant and symmetric percussion tones bilaterally. On auscultation, adventitious sounds are absent.  Musculoskeletal: Patient has 5/5 strength in the upper and lower extremities bilaterally. Full range of motion at the shoulder, elbow and wrist bilaterally. Full range of motion at the hip, knee and ankle bilaterally. No changes in gait. Palpable radial, ulnar and dorsalis pedis pulses bilaterally and symmetrically. Neurologic: Normal speech and language. No gross focal neurologic deficits are appreciated. Cranial nerves: 2-10 normal as tested. Cerebellar: Finger-nose-finger WNL, heel to shin WNL. Speech: No dysarthria or expressive aphasia.  Skin: Patient has superficial abrasions localized to the skin overlying her nose and of her upper lip. Hemostasis had been achieved prior to presenting to the emergency department. Psychiatric: Mood and affect are normal for age. Speech and behavior are normal.  ___________________________________________   LABS (all labs ordered are listed, but only abnormal results are displayed)  Labs Reviewed - No data to  display ____________________________________________  EKG   ____________________________________________  RADIOLOGY   No results found.  ____________________________________________    PROCEDURES  Procedure(s) performed:    Procedures    Medications - No data to display   ____________________________________________   INITIAL IMPRESSION / ASSESSMENT AND PLAN / ED COURSE  Pertinent labs & imaging results that were available during my care of the patient were reviewed by me and considered in my medical decision making (see chart for details).  Review of the [place] CSRS was performed in accordance of the [place] prior to dispensing any controlled drugs.     Assessment  and Plan: Fall:  Patient presents to the emergency department after a fall this evening. Patient denies headache, changes in vision, nausea and abdominal pain. Neurologic exam was reassuring. CT of the head is not warranted at this time. Strict return precautions were given. Patient was advised to follow-up with her primary care provider in one week. Patient has adequate transportation to and from [facility], her current residence. Vital signs are reassuring at this time. Tylenol was recommended for pain. All patient questions were answered. ____________________________________________  FINAL CLINICAL IMPRESSION(S) / ED DIAGNOSES  Final diagnoses:  Fall, initial encounter      NEW MEDICATIONS STARTED DURING THIS VISIT:  Discharge Medication List as of 01/18/2017  8:29 PM          This chart was dictated using voice recognition software/Dragon. Despite best efforts to proofread, errors can occur which can change the meaning. Any change was purely unintentional.    Lannie Fields, PA-C 01/19/17 0018    Harvest Dark, MD 01/22/17 1525

## 2017-01-18 NOTE — ED Notes (Addendum)
Pt. Denies trauma to teeth, c/o pain to top lip. Swelling noted. Bleeding controlled.

## 2017-01-18 NOTE — ED Triage Notes (Signed)
Pt presents to ED from [facility] after tripping and falling. Pt reports landing on her face on carpet, small abrasion noted on bridge of nose and top lip. Pt denies pain, denies LOC. Pt reports does not take blood thinners. Pt alert and oriented.

## 2017-01-18 NOTE — ED Notes (Signed)
D/c instructions reviewed with facility and pt. Understanding verbalized.

## 2017-01-19 DIAGNOSIS — Z79899 Other long term (current) drug therapy: Secondary | ICD-10-CM | POA: Diagnosis not present

## 2017-01-19 DIAGNOSIS — W19XXXS Unspecified fall, sequela: Secondary | ICD-10-CM | POA: Diagnosis not present

## 2017-01-19 DIAGNOSIS — E785 Hyperlipidemia, unspecified: Secondary | ICD-10-CM | POA: Diagnosis not present

## 2017-01-19 DIAGNOSIS — E669 Obesity, unspecified: Secondary | ICD-10-CM | POA: Diagnosis not present

## 2017-01-19 DIAGNOSIS — R635 Abnormal weight gain: Secondary | ICD-10-CM | POA: Diagnosis not present

## 2017-01-19 DIAGNOSIS — G309 Alzheimer's disease, unspecified: Secondary | ICD-10-CM | POA: Diagnosis not present

## 2017-01-19 DIAGNOSIS — R269 Unspecified abnormalities of gait and mobility: Secondary | ICD-10-CM | POA: Diagnosis not present

## 2017-01-19 DIAGNOSIS — I10 Essential (primary) hypertension: Secondary | ICD-10-CM | POA: Diagnosis not present

## 2017-01-20 DIAGNOSIS — F331 Major depressive disorder, recurrent, moderate: Secondary | ICD-10-CM | POA: Diagnosis not present

## 2017-01-21 DIAGNOSIS — M79676 Pain in unspecified toe(s): Secondary | ICD-10-CM | POA: Diagnosis not present

## 2017-01-21 DIAGNOSIS — M2010 Hallux valgus (acquired), unspecified foot: Secondary | ICD-10-CM | POA: Diagnosis not present

## 2017-01-21 DIAGNOSIS — B351 Tinea unguium: Secondary | ICD-10-CM | POA: Diagnosis not present

## 2017-01-21 DIAGNOSIS — G309 Alzheimer's disease, unspecified: Secondary | ICD-10-CM | POA: Diagnosis not present

## 2017-01-27 DIAGNOSIS — R262 Difficulty in walking, not elsewhere classified: Secondary | ICD-10-CM | POA: Diagnosis not present

## 2017-01-27 DIAGNOSIS — R296 Repeated falls: Secondary | ICD-10-CM | POA: Diagnosis not present

## 2017-01-27 DIAGNOSIS — M6281 Muscle weakness (generalized): Secondary | ICD-10-CM | POA: Diagnosis not present

## 2017-02-02 DIAGNOSIS — R262 Difficulty in walking, not elsewhere classified: Secondary | ICD-10-CM | POA: Diagnosis not present

## 2017-02-02 DIAGNOSIS — M6281 Muscle weakness (generalized): Secondary | ICD-10-CM | POA: Diagnosis not present

## 2017-02-02 DIAGNOSIS — R296 Repeated falls: Secondary | ICD-10-CM | POA: Diagnosis not present

## 2017-02-08 DIAGNOSIS — R262 Difficulty in walking, not elsewhere classified: Secondary | ICD-10-CM | POA: Diagnosis not present

## 2017-02-08 DIAGNOSIS — M6281 Muscle weakness (generalized): Secondary | ICD-10-CM | POA: Diagnosis not present

## 2017-02-08 DIAGNOSIS — R296 Repeated falls: Secondary | ICD-10-CM | POA: Diagnosis not present

## 2017-02-09 DIAGNOSIS — E039 Hypothyroidism, unspecified: Secondary | ICD-10-CM | POA: Diagnosis not present

## 2017-02-09 DIAGNOSIS — E119 Type 2 diabetes mellitus without complications: Secondary | ICD-10-CM | POA: Diagnosis not present

## 2017-02-09 DIAGNOSIS — I10 Essential (primary) hypertension: Secondary | ICD-10-CM | POA: Diagnosis not present

## 2017-02-09 DIAGNOSIS — G309 Alzheimer's disease, unspecified: Secondary | ICD-10-CM | POA: Diagnosis not present

## 2017-02-09 DIAGNOSIS — E785 Hyperlipidemia, unspecified: Secondary | ICD-10-CM | POA: Diagnosis not present

## 2017-02-09 DIAGNOSIS — Z79899 Other long term (current) drug therapy: Secondary | ICD-10-CM | POA: Diagnosis not present

## 2017-02-10 DIAGNOSIS — F331 Major depressive disorder, recurrent, moderate: Secondary | ICD-10-CM | POA: Diagnosis not present

## 2017-02-12 DIAGNOSIS — R262 Difficulty in walking, not elsewhere classified: Secondary | ICD-10-CM | POA: Diagnosis not present

## 2017-02-12 DIAGNOSIS — R296 Repeated falls: Secondary | ICD-10-CM | POA: Diagnosis not present

## 2017-02-12 DIAGNOSIS — M6281 Muscle weakness (generalized): Secondary | ICD-10-CM | POA: Diagnosis not present

## 2017-02-15 ENCOUNTER — Telehealth: Payer: Self-pay | Admitting: Internal Medicine

## 2017-02-15 DIAGNOSIS — R262 Difficulty in walking, not elsewhere classified: Secondary | ICD-10-CM | POA: Diagnosis not present

## 2017-02-15 DIAGNOSIS — M6281 Muscle weakness (generalized): Secondary | ICD-10-CM | POA: Diagnosis not present

## 2017-02-15 DIAGNOSIS — R296 Repeated falls: Secondary | ICD-10-CM | POA: Diagnosis not present

## 2017-02-15 NOTE — Telephone Encounter (Signed)
I called pt no ans regarding to sch AWV. Thank you!

## 2017-02-16 DIAGNOSIS — J449 Chronic obstructive pulmonary disease, unspecified: Secondary | ICD-10-CM | POA: Diagnosis not present

## 2017-02-16 DIAGNOSIS — R634 Abnormal weight loss: Secondary | ICD-10-CM | POA: Diagnosis not present

## 2017-02-16 DIAGNOSIS — I639 Cerebral infarction, unspecified: Secondary | ICD-10-CM | POA: Diagnosis not present

## 2017-02-16 DIAGNOSIS — Z79899 Other long term (current) drug therapy: Secondary | ICD-10-CM | POA: Diagnosis not present

## 2017-02-16 DIAGNOSIS — E039 Hypothyroidism, unspecified: Secondary | ICD-10-CM | POA: Diagnosis not present

## 2017-02-16 DIAGNOSIS — I10 Essential (primary) hypertension: Secondary | ICD-10-CM | POA: Diagnosis not present

## 2017-02-17 DIAGNOSIS — F331 Major depressive disorder, recurrent, moderate: Secondary | ICD-10-CM | POA: Diagnosis not present

## 2017-02-25 DIAGNOSIS — Z79899 Other long term (current) drug therapy: Secondary | ICD-10-CM | POA: Diagnosis not present

## 2017-03-03 DIAGNOSIS — F331 Major depressive disorder, recurrent, moderate: Secondary | ICD-10-CM | POA: Diagnosis not present

## 2017-03-16 DIAGNOSIS — Z79899 Other long term (current) drug therapy: Secondary | ICD-10-CM | POA: Diagnosis not present

## 2017-03-16 DIAGNOSIS — E119 Type 2 diabetes mellitus without complications: Secondary | ICD-10-CM | POA: Diagnosis not present

## 2017-03-16 DIAGNOSIS — K219 Gastro-esophageal reflux disease without esophagitis: Secondary | ICD-10-CM | POA: Diagnosis not present

## 2017-03-16 DIAGNOSIS — R5383 Other fatigue: Secondary | ICD-10-CM | POA: Diagnosis not present

## 2017-03-16 DIAGNOSIS — J309 Allergic rhinitis, unspecified: Secondary | ICD-10-CM | POA: Diagnosis not present

## 2017-03-17 DIAGNOSIS — F331 Major depressive disorder, recurrent, moderate: Secondary | ICD-10-CM | POA: Diagnosis not present

## 2017-03-24 DIAGNOSIS — M2010 Hallux valgus (acquired), unspecified foot: Secondary | ICD-10-CM | POA: Diagnosis not present

## 2017-03-24 DIAGNOSIS — E119 Type 2 diabetes mellitus without complications: Secondary | ICD-10-CM | POA: Diagnosis not present

## 2017-03-24 DIAGNOSIS — B351 Tinea unguium: Secondary | ICD-10-CM | POA: Diagnosis not present

## 2017-03-24 DIAGNOSIS — M79676 Pain in unspecified toe(s): Secondary | ICD-10-CM | POA: Diagnosis not present

## 2017-04-13 DIAGNOSIS — K219 Gastro-esophageal reflux disease without esophagitis: Secondary | ICD-10-CM | POA: Diagnosis not present

## 2017-04-13 DIAGNOSIS — R5383 Other fatigue: Secondary | ICD-10-CM | POA: Diagnosis not present

## 2017-04-13 DIAGNOSIS — Z79899 Other long term (current) drug therapy: Secondary | ICD-10-CM | POA: Diagnosis not present

## 2017-04-13 DIAGNOSIS — E039 Hypothyroidism, unspecified: Secondary | ICD-10-CM | POA: Diagnosis not present

## 2017-04-13 DIAGNOSIS — J309 Allergic rhinitis, unspecified: Secondary | ICD-10-CM | POA: Diagnosis not present

## 2017-04-13 DIAGNOSIS — E119 Type 2 diabetes mellitus without complications: Secondary | ICD-10-CM | POA: Diagnosis not present

## 2017-04-13 DIAGNOSIS — F329 Major depressive disorder, single episode, unspecified: Secondary | ICD-10-CM | POA: Diagnosis not present

## 2017-04-13 DIAGNOSIS — I10 Essential (primary) hypertension: Secondary | ICD-10-CM | POA: Diagnosis not present

## 2017-04-14 DIAGNOSIS — F331 Major depressive disorder, recurrent, moderate: Secondary | ICD-10-CM | POA: Diagnosis not present

## 2017-05-03 DIAGNOSIS — F0280 Dementia in other diseases classified elsewhere without behavioral disturbance: Secondary | ICD-10-CM | POA: Diagnosis not present

## 2017-05-03 DIAGNOSIS — G309 Alzheimer's disease, unspecified: Secondary | ICD-10-CM | POA: Diagnosis not present

## 2017-05-04 ENCOUNTER — Telehealth: Payer: Self-pay | Admitting: Internal Medicine

## 2017-05-04 DIAGNOSIS — I10 Essential (primary) hypertension: Secondary | ICD-10-CM | POA: Diagnosis not present

## 2017-05-04 DIAGNOSIS — K219 Gastro-esophageal reflux disease without esophagitis: Secondary | ICD-10-CM | POA: Diagnosis not present

## 2017-05-04 DIAGNOSIS — F329 Major depressive disorder, single episode, unspecified: Secondary | ICD-10-CM | POA: Diagnosis not present

## 2017-05-04 DIAGNOSIS — E039 Hypothyroidism, unspecified: Secondary | ICD-10-CM | POA: Diagnosis not present

## 2017-05-04 DIAGNOSIS — E119 Type 2 diabetes mellitus without complications: Secondary | ICD-10-CM | POA: Diagnosis not present

## 2017-05-04 DIAGNOSIS — R531 Weakness: Secondary | ICD-10-CM | POA: Diagnosis not present

## 2017-05-04 DIAGNOSIS — E7800 Pure hypercholesterolemia, unspecified: Secondary | ICD-10-CM | POA: Diagnosis not present

## 2017-05-04 DIAGNOSIS — R5383 Other fatigue: Secondary | ICD-10-CM | POA: Diagnosis not present

## 2017-05-04 DIAGNOSIS — Z7984 Long term (current) use of oral hypoglycemic drugs: Secondary | ICD-10-CM | POA: Diagnosis not present

## 2017-05-04 DIAGNOSIS — J449 Chronic obstructive pulmonary disease, unspecified: Secondary | ICD-10-CM | POA: Diagnosis not present

## 2017-05-04 DIAGNOSIS — J309 Allergic rhinitis, unspecified: Secondary | ICD-10-CM | POA: Diagnosis not present

## 2017-05-04 DIAGNOSIS — Z9181 History of falling: Secondary | ICD-10-CM | POA: Diagnosis not present

## 2017-05-04 DIAGNOSIS — Z8673 Personal history of transient ischemic attack (TIA), and cerebral infarction without residual deficits: Secondary | ICD-10-CM | POA: Diagnosis not present

## 2017-05-04 DIAGNOSIS — R269 Unspecified abnormalities of gait and mobility: Secondary | ICD-10-CM | POA: Diagnosis not present

## 2017-05-04 DIAGNOSIS — E1142 Type 2 diabetes mellitus with diabetic polyneuropathy: Secondary | ICD-10-CM | POA: Diagnosis not present

## 2017-05-04 DIAGNOSIS — G309 Alzheimer's disease, unspecified: Secondary | ICD-10-CM | POA: Diagnosis not present

## 2017-05-04 DIAGNOSIS — D519 Vitamin B12 deficiency anemia, unspecified: Secondary | ICD-10-CM | POA: Diagnosis not present

## 2017-05-04 DIAGNOSIS — F0280 Dementia in other diseases classified elsewhere without behavioral disturbance: Secondary | ICD-10-CM | POA: Diagnosis not present

## 2017-05-04 DIAGNOSIS — R197 Diarrhea, unspecified: Secondary | ICD-10-CM | POA: Diagnosis not present

## 2017-05-04 NOTE — Telephone Encounter (Signed)
Called pt to schedule AWV. No answer, No VM. Phone just kept ringing.

## 2017-05-05 DIAGNOSIS — F331 Major depressive disorder, recurrent, moderate: Secondary | ICD-10-CM | POA: Diagnosis not present

## 2017-05-06 DIAGNOSIS — F0280 Dementia in other diseases classified elsewhere without behavioral disturbance: Secondary | ICD-10-CM | POA: Diagnosis not present

## 2017-05-06 DIAGNOSIS — R531 Weakness: Secondary | ICD-10-CM | POA: Diagnosis not present

## 2017-05-06 DIAGNOSIS — G309 Alzheimer's disease, unspecified: Secondary | ICD-10-CM | POA: Diagnosis not present

## 2017-05-06 DIAGNOSIS — J449 Chronic obstructive pulmonary disease, unspecified: Secondary | ICD-10-CM | POA: Diagnosis not present

## 2017-05-06 DIAGNOSIS — I10 Essential (primary) hypertension: Secondary | ICD-10-CM | POA: Diagnosis not present

## 2017-05-06 DIAGNOSIS — E1142 Type 2 diabetes mellitus with diabetic polyneuropathy: Secondary | ICD-10-CM | POA: Diagnosis not present

## 2017-05-07 DIAGNOSIS — I10 Essential (primary) hypertension: Secondary | ICD-10-CM | POA: Diagnosis not present

## 2017-05-07 DIAGNOSIS — G309 Alzheimer's disease, unspecified: Secondary | ICD-10-CM | POA: Diagnosis not present

## 2017-05-07 DIAGNOSIS — J449 Chronic obstructive pulmonary disease, unspecified: Secondary | ICD-10-CM | POA: Diagnosis not present

## 2017-05-07 DIAGNOSIS — R531 Weakness: Secondary | ICD-10-CM | POA: Diagnosis not present

## 2017-05-07 DIAGNOSIS — F0280 Dementia in other diseases classified elsewhere without behavioral disturbance: Secondary | ICD-10-CM | POA: Diagnosis not present

## 2017-05-07 DIAGNOSIS — E1142 Type 2 diabetes mellitus with diabetic polyneuropathy: Secondary | ICD-10-CM | POA: Diagnosis not present

## 2017-05-10 DIAGNOSIS — G309 Alzheimer's disease, unspecified: Secondary | ICD-10-CM | POA: Diagnosis not present

## 2017-05-10 DIAGNOSIS — F0280 Dementia in other diseases classified elsewhere without behavioral disturbance: Secondary | ICD-10-CM | POA: Diagnosis not present

## 2017-05-10 DIAGNOSIS — I10 Essential (primary) hypertension: Secondary | ICD-10-CM | POA: Diagnosis not present

## 2017-05-10 DIAGNOSIS — E1142 Type 2 diabetes mellitus with diabetic polyneuropathy: Secondary | ICD-10-CM | POA: Diagnosis not present

## 2017-05-10 DIAGNOSIS — R531 Weakness: Secondary | ICD-10-CM | POA: Diagnosis not present

## 2017-05-10 DIAGNOSIS — J449 Chronic obstructive pulmonary disease, unspecified: Secondary | ICD-10-CM | POA: Diagnosis not present

## 2017-05-11 DIAGNOSIS — R531 Weakness: Secondary | ICD-10-CM | POA: Diagnosis not present

## 2017-05-11 DIAGNOSIS — F0280 Dementia in other diseases classified elsewhere without behavioral disturbance: Secondary | ICD-10-CM | POA: Diagnosis not present

## 2017-05-11 DIAGNOSIS — E1142 Type 2 diabetes mellitus with diabetic polyneuropathy: Secondary | ICD-10-CM | POA: Diagnosis not present

## 2017-05-11 DIAGNOSIS — G309 Alzheimer's disease, unspecified: Secondary | ICD-10-CM | POA: Diagnosis not present

## 2017-05-11 DIAGNOSIS — I10 Essential (primary) hypertension: Secondary | ICD-10-CM | POA: Diagnosis not present

## 2017-05-11 DIAGNOSIS — J449 Chronic obstructive pulmonary disease, unspecified: Secondary | ICD-10-CM | POA: Diagnosis not present

## 2017-05-13 DIAGNOSIS — F0280 Dementia in other diseases classified elsewhere without behavioral disturbance: Secondary | ICD-10-CM | POA: Diagnosis not present

## 2017-05-13 DIAGNOSIS — J449 Chronic obstructive pulmonary disease, unspecified: Secondary | ICD-10-CM | POA: Diagnosis not present

## 2017-05-13 DIAGNOSIS — G309 Alzheimer's disease, unspecified: Secondary | ICD-10-CM | POA: Diagnosis not present

## 2017-05-13 DIAGNOSIS — R531 Weakness: Secondary | ICD-10-CM | POA: Diagnosis not present

## 2017-05-13 DIAGNOSIS — E1142 Type 2 diabetes mellitus with diabetic polyneuropathy: Secondary | ICD-10-CM | POA: Diagnosis not present

## 2017-05-13 DIAGNOSIS — I10 Essential (primary) hypertension: Secondary | ICD-10-CM | POA: Diagnosis not present

## 2017-05-14 DIAGNOSIS — F0280 Dementia in other diseases classified elsewhere without behavioral disturbance: Secondary | ICD-10-CM | POA: Diagnosis not present

## 2017-05-14 DIAGNOSIS — R531 Weakness: Secondary | ICD-10-CM | POA: Diagnosis not present

## 2017-05-14 DIAGNOSIS — I10 Essential (primary) hypertension: Secondary | ICD-10-CM | POA: Diagnosis not present

## 2017-05-14 DIAGNOSIS — G309 Alzheimer's disease, unspecified: Secondary | ICD-10-CM | POA: Diagnosis not present

## 2017-05-14 DIAGNOSIS — E1142 Type 2 diabetes mellitus with diabetic polyneuropathy: Secondary | ICD-10-CM | POA: Diagnosis not present

## 2017-05-14 DIAGNOSIS — J449 Chronic obstructive pulmonary disease, unspecified: Secondary | ICD-10-CM | POA: Diagnosis not present

## 2017-05-18 DIAGNOSIS — J449 Chronic obstructive pulmonary disease, unspecified: Secondary | ICD-10-CM | POA: Diagnosis not present

## 2017-05-18 DIAGNOSIS — G309 Alzheimer's disease, unspecified: Secondary | ICD-10-CM | POA: Diagnosis not present

## 2017-05-18 DIAGNOSIS — I10 Essential (primary) hypertension: Secondary | ICD-10-CM | POA: Diagnosis not present

## 2017-05-18 DIAGNOSIS — E1142 Type 2 diabetes mellitus with diabetic polyneuropathy: Secondary | ICD-10-CM | POA: Diagnosis not present

## 2017-05-18 DIAGNOSIS — R531 Weakness: Secondary | ICD-10-CM | POA: Diagnosis not present

## 2017-05-18 DIAGNOSIS — F0280 Dementia in other diseases classified elsewhere without behavioral disturbance: Secondary | ICD-10-CM | POA: Diagnosis not present

## 2017-05-19 DIAGNOSIS — F331 Major depressive disorder, recurrent, moderate: Secondary | ICD-10-CM | POA: Diagnosis not present

## 2017-05-20 DIAGNOSIS — R531 Weakness: Secondary | ICD-10-CM | POA: Diagnosis not present

## 2017-05-20 DIAGNOSIS — E1142 Type 2 diabetes mellitus with diabetic polyneuropathy: Secondary | ICD-10-CM | POA: Diagnosis not present

## 2017-05-20 DIAGNOSIS — E785 Hyperlipidemia, unspecified: Secondary | ICD-10-CM | POA: Diagnosis not present

## 2017-05-20 DIAGNOSIS — I10 Essential (primary) hypertension: Secondary | ICD-10-CM | POA: Diagnosis not present

## 2017-05-20 DIAGNOSIS — J449 Chronic obstructive pulmonary disease, unspecified: Secondary | ICD-10-CM | POA: Diagnosis not present

## 2017-05-20 DIAGNOSIS — F0280 Dementia in other diseases classified elsewhere without behavioral disturbance: Secondary | ICD-10-CM | POA: Diagnosis not present

## 2017-05-20 DIAGNOSIS — G309 Alzheimer's disease, unspecified: Secondary | ICD-10-CM | POA: Diagnosis not present

## 2017-05-25 DIAGNOSIS — E1142 Type 2 diabetes mellitus with diabetic polyneuropathy: Secondary | ICD-10-CM | POA: Diagnosis not present

## 2017-05-25 DIAGNOSIS — F0280 Dementia in other diseases classified elsewhere without behavioral disturbance: Secondary | ICD-10-CM | POA: Diagnosis not present

## 2017-05-25 DIAGNOSIS — G309 Alzheimer's disease, unspecified: Secondary | ICD-10-CM | POA: Diagnosis not present

## 2017-05-25 DIAGNOSIS — J449 Chronic obstructive pulmonary disease, unspecified: Secondary | ICD-10-CM | POA: Diagnosis not present

## 2017-05-25 DIAGNOSIS — R531 Weakness: Secondary | ICD-10-CM | POA: Diagnosis not present

## 2017-05-25 DIAGNOSIS — I10 Essential (primary) hypertension: Secondary | ICD-10-CM | POA: Diagnosis not present

## 2017-05-27 DIAGNOSIS — M2010 Hallux valgus (acquired), unspecified foot: Secondary | ICD-10-CM | POA: Diagnosis not present

## 2017-05-27 DIAGNOSIS — R262 Difficulty in walking, not elsewhere classified: Secondary | ICD-10-CM | POA: Diagnosis not present

## 2017-05-27 DIAGNOSIS — I10 Essential (primary) hypertension: Secondary | ICD-10-CM | POA: Diagnosis not present

## 2017-05-27 DIAGNOSIS — M79676 Pain in unspecified toe(s): Secondary | ICD-10-CM | POA: Diagnosis not present

## 2017-05-27 DIAGNOSIS — F0280 Dementia in other diseases classified elsewhere without behavioral disturbance: Secondary | ICD-10-CM | POA: Diagnosis not present

## 2017-05-27 DIAGNOSIS — H35111 Retinopathy of prematurity, stage 0, right eye: Secondary | ICD-10-CM | POA: Diagnosis not present

## 2017-05-27 DIAGNOSIS — G309 Alzheimer's disease, unspecified: Secondary | ICD-10-CM | POA: Diagnosis not present

## 2017-05-27 DIAGNOSIS — Z961 Presence of intraocular lens: Secondary | ICD-10-CM | POA: Diagnosis not present

## 2017-05-27 DIAGNOSIS — H401134 Primary open-angle glaucoma, bilateral, indeterminate stage: Secondary | ICD-10-CM | POA: Diagnosis not present

## 2017-05-27 DIAGNOSIS — B351 Tinea unguium: Secondary | ICD-10-CM | POA: Diagnosis not present

## 2017-05-27 DIAGNOSIS — R531 Weakness: Secondary | ICD-10-CM | POA: Diagnosis not present

## 2017-05-27 DIAGNOSIS — J449 Chronic obstructive pulmonary disease, unspecified: Secondary | ICD-10-CM | POA: Diagnosis not present

## 2017-05-27 DIAGNOSIS — E1142 Type 2 diabetes mellitus with diabetic polyneuropathy: Secondary | ICD-10-CM | POA: Diagnosis not present

## 2017-05-27 DIAGNOSIS — E13311 Other specified diabetes mellitus with unspecified diabetic retinopathy with macular edema: Secondary | ICD-10-CM | POA: Diagnosis not present

## 2017-05-31 DIAGNOSIS — J449 Chronic obstructive pulmonary disease, unspecified: Secondary | ICD-10-CM | POA: Diagnosis not present

## 2017-05-31 DIAGNOSIS — R531 Weakness: Secondary | ICD-10-CM | POA: Diagnosis not present

## 2017-05-31 DIAGNOSIS — F0280 Dementia in other diseases classified elsewhere without behavioral disturbance: Secondary | ICD-10-CM | POA: Diagnosis not present

## 2017-05-31 DIAGNOSIS — G309 Alzheimer's disease, unspecified: Secondary | ICD-10-CM | POA: Diagnosis not present

## 2017-05-31 DIAGNOSIS — E1142 Type 2 diabetes mellitus with diabetic polyneuropathy: Secondary | ICD-10-CM | POA: Diagnosis not present

## 2017-05-31 DIAGNOSIS — I10 Essential (primary) hypertension: Secondary | ICD-10-CM | POA: Diagnosis not present

## 2017-06-01 DIAGNOSIS — R531 Weakness: Secondary | ICD-10-CM | POA: Diagnosis not present

## 2017-06-01 DIAGNOSIS — R197 Diarrhea, unspecified: Secondary | ICD-10-CM | POA: Diagnosis not present

## 2017-06-01 DIAGNOSIS — G309 Alzheimer's disease, unspecified: Secondary | ICD-10-CM | POA: Diagnosis not present

## 2017-06-01 DIAGNOSIS — I10 Essential (primary) hypertension: Secondary | ICD-10-CM | POA: Diagnosis not present

## 2017-06-01 DIAGNOSIS — J449 Chronic obstructive pulmonary disease, unspecified: Secondary | ICD-10-CM | POA: Diagnosis not present

## 2017-06-01 DIAGNOSIS — E1142 Type 2 diabetes mellitus with diabetic polyneuropathy: Secondary | ICD-10-CM | POA: Diagnosis not present

## 2017-06-01 DIAGNOSIS — R269 Unspecified abnormalities of gait and mobility: Secondary | ICD-10-CM | POA: Diagnosis not present

## 2017-06-01 DIAGNOSIS — F0280 Dementia in other diseases classified elsewhere without behavioral disturbance: Secondary | ICD-10-CM | POA: Diagnosis not present

## 2017-06-01 DIAGNOSIS — Z79899 Other long term (current) drug therapy: Secondary | ICD-10-CM | POA: Diagnosis not present

## 2017-06-01 DIAGNOSIS — E119 Type 2 diabetes mellitus without complications: Secondary | ICD-10-CM | POA: Diagnosis not present

## 2017-06-01 DIAGNOSIS — F329 Major depressive disorder, single episode, unspecified: Secondary | ICD-10-CM | POA: Diagnosis not present

## 2017-06-01 DIAGNOSIS — M542 Cervicalgia: Secondary | ICD-10-CM | POA: Diagnosis not present

## 2017-06-02 NOTE — Telephone Encounter (Signed)
Called pt to schedule AWV. No answer, No VM. Phone just kept ringing.

## 2017-06-03 DIAGNOSIS — J449 Chronic obstructive pulmonary disease, unspecified: Secondary | ICD-10-CM | POA: Diagnosis not present

## 2017-06-03 DIAGNOSIS — G309 Alzheimer's disease, unspecified: Secondary | ICD-10-CM | POA: Diagnosis not present

## 2017-06-03 DIAGNOSIS — E1142 Type 2 diabetes mellitus with diabetic polyneuropathy: Secondary | ICD-10-CM | POA: Diagnosis not present

## 2017-06-03 DIAGNOSIS — I10 Essential (primary) hypertension: Secondary | ICD-10-CM | POA: Diagnosis not present

## 2017-06-03 DIAGNOSIS — R531 Weakness: Secondary | ICD-10-CM | POA: Diagnosis not present

## 2017-06-03 DIAGNOSIS — F0280 Dementia in other diseases classified elsewhere without behavioral disturbance: Secondary | ICD-10-CM | POA: Diagnosis not present

## 2017-06-08 DIAGNOSIS — G309 Alzheimer's disease, unspecified: Secondary | ICD-10-CM | POA: Diagnosis not present

## 2017-06-08 DIAGNOSIS — E1142 Type 2 diabetes mellitus with diabetic polyneuropathy: Secondary | ICD-10-CM | POA: Diagnosis not present

## 2017-06-08 DIAGNOSIS — J449 Chronic obstructive pulmonary disease, unspecified: Secondary | ICD-10-CM | POA: Diagnosis not present

## 2017-06-08 DIAGNOSIS — F0280 Dementia in other diseases classified elsewhere without behavioral disturbance: Secondary | ICD-10-CM | POA: Diagnosis not present

## 2017-06-08 DIAGNOSIS — R531 Weakness: Secondary | ICD-10-CM | POA: Diagnosis not present

## 2017-06-08 DIAGNOSIS — I10 Essential (primary) hypertension: Secondary | ICD-10-CM | POA: Diagnosis not present

## 2017-06-10 DIAGNOSIS — D519 Vitamin B12 deficiency anemia, unspecified: Secondary | ICD-10-CM | POA: Diagnosis not present

## 2017-06-10 DIAGNOSIS — Z79899 Other long term (current) drug therapy: Secondary | ICD-10-CM | POA: Diagnosis not present

## 2017-06-11 DIAGNOSIS — F0280 Dementia in other diseases classified elsewhere without behavioral disturbance: Secondary | ICD-10-CM | POA: Diagnosis not present

## 2017-06-11 DIAGNOSIS — E1142 Type 2 diabetes mellitus with diabetic polyneuropathy: Secondary | ICD-10-CM | POA: Diagnosis not present

## 2017-06-11 DIAGNOSIS — J449 Chronic obstructive pulmonary disease, unspecified: Secondary | ICD-10-CM | POA: Diagnosis not present

## 2017-06-11 DIAGNOSIS — G309 Alzheimer's disease, unspecified: Secondary | ICD-10-CM | POA: Diagnosis not present

## 2017-06-11 DIAGNOSIS — I10 Essential (primary) hypertension: Secondary | ICD-10-CM | POA: Diagnosis not present

## 2017-06-11 DIAGNOSIS — R531 Weakness: Secondary | ICD-10-CM | POA: Diagnosis not present

## 2017-06-15 DIAGNOSIS — J449 Chronic obstructive pulmonary disease, unspecified: Secondary | ICD-10-CM | POA: Diagnosis not present

## 2017-06-15 DIAGNOSIS — E1142 Type 2 diabetes mellitus with diabetic polyneuropathy: Secondary | ICD-10-CM | POA: Diagnosis not present

## 2017-06-15 DIAGNOSIS — F0280 Dementia in other diseases classified elsewhere without behavioral disturbance: Secondary | ICD-10-CM | POA: Diagnosis not present

## 2017-06-15 DIAGNOSIS — R531 Weakness: Secondary | ICD-10-CM | POA: Diagnosis not present

## 2017-06-15 DIAGNOSIS — G309 Alzheimer's disease, unspecified: Secondary | ICD-10-CM | POA: Diagnosis not present

## 2017-06-15 DIAGNOSIS — I10 Essential (primary) hypertension: Secondary | ICD-10-CM | POA: Diagnosis not present

## 2017-06-17 DIAGNOSIS — G309 Alzheimer's disease, unspecified: Secondary | ICD-10-CM | POA: Diagnosis not present

## 2017-06-17 DIAGNOSIS — R531 Weakness: Secondary | ICD-10-CM | POA: Diagnosis not present

## 2017-06-17 DIAGNOSIS — E1142 Type 2 diabetes mellitus with diabetic polyneuropathy: Secondary | ICD-10-CM | POA: Diagnosis not present

## 2017-06-17 DIAGNOSIS — I10 Essential (primary) hypertension: Secondary | ICD-10-CM | POA: Diagnosis not present

## 2017-06-17 DIAGNOSIS — F0280 Dementia in other diseases classified elsewhere without behavioral disturbance: Secondary | ICD-10-CM | POA: Diagnosis not present

## 2017-06-17 DIAGNOSIS — J449 Chronic obstructive pulmonary disease, unspecified: Secondary | ICD-10-CM | POA: Diagnosis not present

## 2017-06-22 DIAGNOSIS — Z79899 Other long term (current) drug therapy: Secondary | ICD-10-CM | POA: Diagnosis not present

## 2017-06-22 DIAGNOSIS — R269 Unspecified abnormalities of gait and mobility: Secondary | ICD-10-CM | POA: Diagnosis not present

## 2017-06-22 DIAGNOSIS — G309 Alzheimer's disease, unspecified: Secondary | ICD-10-CM | POA: Diagnosis not present

## 2017-06-22 DIAGNOSIS — R197 Diarrhea, unspecified: Secondary | ICD-10-CM | POA: Diagnosis not present

## 2017-06-22 DIAGNOSIS — I10 Essential (primary) hypertension: Secondary | ICD-10-CM | POA: Diagnosis not present

## 2017-06-22 DIAGNOSIS — E119 Type 2 diabetes mellitus without complications: Secondary | ICD-10-CM | POA: Diagnosis not present

## 2017-06-22 DIAGNOSIS — F0280 Dementia in other diseases classified elsewhere without behavioral disturbance: Secondary | ICD-10-CM | POA: Diagnosis not present

## 2017-06-22 DIAGNOSIS — F329 Major depressive disorder, single episode, unspecified: Secondary | ICD-10-CM | POA: Diagnosis not present

## 2017-06-22 DIAGNOSIS — E1142 Type 2 diabetes mellitus with diabetic polyneuropathy: Secondary | ICD-10-CM | POA: Diagnosis not present

## 2017-06-22 DIAGNOSIS — R531 Weakness: Secondary | ICD-10-CM | POA: Diagnosis not present

## 2017-06-22 DIAGNOSIS — J449 Chronic obstructive pulmonary disease, unspecified: Secondary | ICD-10-CM | POA: Diagnosis not present

## 2017-06-23 DIAGNOSIS — F331 Major depressive disorder, recurrent, moderate: Secondary | ICD-10-CM | POA: Diagnosis not present

## 2017-06-24 DIAGNOSIS — G309 Alzheimer's disease, unspecified: Secondary | ICD-10-CM | POA: Diagnosis not present

## 2017-06-24 DIAGNOSIS — F0280 Dementia in other diseases classified elsewhere without behavioral disturbance: Secondary | ICD-10-CM | POA: Diagnosis not present

## 2017-06-24 DIAGNOSIS — J449 Chronic obstructive pulmonary disease, unspecified: Secondary | ICD-10-CM | POA: Diagnosis not present

## 2017-06-24 DIAGNOSIS — I10 Essential (primary) hypertension: Secondary | ICD-10-CM | POA: Diagnosis not present

## 2017-06-24 DIAGNOSIS — E1142 Type 2 diabetes mellitus with diabetic polyneuropathy: Secondary | ICD-10-CM | POA: Diagnosis not present

## 2017-06-24 DIAGNOSIS — R531 Weakness: Secondary | ICD-10-CM | POA: Diagnosis not present

## 2017-06-29 DIAGNOSIS — R531 Weakness: Secondary | ICD-10-CM | POA: Diagnosis not present

## 2017-06-29 DIAGNOSIS — F0280 Dementia in other diseases classified elsewhere without behavioral disturbance: Secondary | ICD-10-CM | POA: Diagnosis not present

## 2017-06-29 DIAGNOSIS — I10 Essential (primary) hypertension: Secondary | ICD-10-CM | POA: Diagnosis not present

## 2017-06-29 DIAGNOSIS — G309 Alzheimer's disease, unspecified: Secondary | ICD-10-CM | POA: Diagnosis not present

## 2017-06-29 DIAGNOSIS — E1142 Type 2 diabetes mellitus with diabetic polyneuropathy: Secondary | ICD-10-CM | POA: Diagnosis not present

## 2017-06-29 DIAGNOSIS — J449 Chronic obstructive pulmonary disease, unspecified: Secondary | ICD-10-CM | POA: Diagnosis not present

## 2017-07-01 DIAGNOSIS — F0280 Dementia in other diseases classified elsewhere without behavioral disturbance: Secondary | ICD-10-CM | POA: Diagnosis not present

## 2017-07-01 DIAGNOSIS — J449 Chronic obstructive pulmonary disease, unspecified: Secondary | ICD-10-CM | POA: Diagnosis not present

## 2017-07-01 DIAGNOSIS — I10 Essential (primary) hypertension: Secondary | ICD-10-CM | POA: Diagnosis not present

## 2017-07-01 DIAGNOSIS — E1142 Type 2 diabetes mellitus with diabetic polyneuropathy: Secondary | ICD-10-CM | POA: Diagnosis not present

## 2017-07-01 DIAGNOSIS — R531 Weakness: Secondary | ICD-10-CM | POA: Diagnosis not present

## 2017-07-01 DIAGNOSIS — G309 Alzheimer's disease, unspecified: Secondary | ICD-10-CM | POA: Diagnosis not present

## 2017-07-14 DIAGNOSIS — F331 Major depressive disorder, recurrent, moderate: Secondary | ICD-10-CM | POA: Diagnosis not present

## 2017-07-21 DIAGNOSIS — E119 Type 2 diabetes mellitus without complications: Secondary | ICD-10-CM | POA: Diagnosis not present

## 2017-07-21 DIAGNOSIS — S5012XA Contusion of left forearm, initial encounter: Secondary | ICD-10-CM | POA: Diagnosis not present

## 2017-07-21 DIAGNOSIS — F331 Major depressive disorder, recurrent, moderate: Secondary | ICD-10-CM | POA: Diagnosis not present

## 2017-07-21 DIAGNOSIS — E785 Hyperlipidemia, unspecified: Secondary | ICD-10-CM | POA: Diagnosis not present

## 2017-07-21 DIAGNOSIS — W19XXXA Unspecified fall, initial encounter: Secondary | ICD-10-CM | POA: Diagnosis not present

## 2017-07-21 DIAGNOSIS — E039 Hypothyroidism, unspecified: Secondary | ICD-10-CM | POA: Diagnosis not present

## 2017-07-21 DIAGNOSIS — M25532 Pain in left wrist: Secondary | ICD-10-CM | POA: Diagnosis not present

## 2017-07-21 DIAGNOSIS — I10 Essential (primary) hypertension: Secondary | ICD-10-CM | POA: Diagnosis not present

## 2017-07-29 DIAGNOSIS — M79676 Pain in unspecified toe(s): Secondary | ICD-10-CM | POA: Diagnosis not present

## 2017-07-29 DIAGNOSIS — M2010 Hallux valgus (acquired), unspecified foot: Secondary | ICD-10-CM | POA: Diagnosis not present

## 2017-07-29 DIAGNOSIS — R262 Difficulty in walking, not elsewhere classified: Secondary | ICD-10-CM | POA: Diagnosis not present

## 2017-07-29 DIAGNOSIS — L605 Yellow nail syndrome: Secondary | ICD-10-CM | POA: Diagnosis not present

## 2017-07-29 DIAGNOSIS — B351 Tinea unguium: Secondary | ICD-10-CM | POA: Diagnosis not present

## 2017-08-03 DIAGNOSIS — Z79899 Other long term (current) drug therapy: Secondary | ICD-10-CM | POA: Diagnosis not present

## 2017-08-03 DIAGNOSIS — E119 Type 2 diabetes mellitus without complications: Secondary | ICD-10-CM | POA: Diagnosis not present

## 2017-08-03 DIAGNOSIS — R269 Unspecified abnormalities of gait and mobility: Secondary | ICD-10-CM | POA: Diagnosis not present

## 2017-08-03 DIAGNOSIS — R531 Weakness: Secondary | ICD-10-CM | POA: Diagnosis not present

## 2017-08-03 DIAGNOSIS — F329 Major depressive disorder, single episode, unspecified: Secondary | ICD-10-CM | POA: Diagnosis not present

## 2017-08-03 DIAGNOSIS — Z111 Encounter for screening for respiratory tuberculosis: Secondary | ICD-10-CM | POA: Diagnosis not present

## 2017-08-03 DIAGNOSIS — I10 Essential (primary) hypertension: Secondary | ICD-10-CM | POA: Diagnosis not present

## 2017-08-10 DIAGNOSIS — E119 Type 2 diabetes mellitus without complications: Secondary | ICD-10-CM | POA: Diagnosis not present

## 2017-08-10 DIAGNOSIS — R531 Weakness: Secondary | ICD-10-CM | POA: Diagnosis not present

## 2017-08-10 DIAGNOSIS — F329 Major depressive disorder, single episode, unspecified: Secondary | ICD-10-CM | POA: Diagnosis not present

## 2017-08-10 DIAGNOSIS — R197 Diarrhea, unspecified: Secondary | ICD-10-CM | POA: Diagnosis not present

## 2017-08-10 DIAGNOSIS — M549 Dorsalgia, unspecified: Secondary | ICD-10-CM | POA: Diagnosis not present

## 2017-08-10 DIAGNOSIS — I10 Essential (primary) hypertension: Secondary | ICD-10-CM | POA: Diagnosis not present

## 2017-08-10 DIAGNOSIS — R269 Unspecified abnormalities of gait and mobility: Secondary | ICD-10-CM | POA: Diagnosis not present

## 2017-08-10 DIAGNOSIS — S0091XA Abrasion of unspecified part of head, initial encounter: Secondary | ICD-10-CM | POA: Diagnosis not present

## 2017-08-11 DIAGNOSIS — F322 Major depressive disorder, single episode, severe without psychotic features: Secondary | ICD-10-CM | POA: Diagnosis not present

## 2017-08-18 DIAGNOSIS — E785 Hyperlipidemia, unspecified: Secondary | ICD-10-CM | POA: Diagnosis not present

## 2017-08-18 DIAGNOSIS — M25532 Pain in left wrist: Secondary | ICD-10-CM | POA: Diagnosis not present

## 2017-08-18 DIAGNOSIS — I10 Essential (primary) hypertension: Secondary | ICD-10-CM | POA: Diagnosis not present

## 2017-08-18 DIAGNOSIS — R197 Diarrhea, unspecified: Secondary | ICD-10-CM | POA: Diagnosis not present

## 2017-08-18 DIAGNOSIS — E039 Hypothyroidism, unspecified: Secondary | ICD-10-CM | POA: Diagnosis not present

## 2017-08-18 DIAGNOSIS — E119 Type 2 diabetes mellitus without complications: Secondary | ICD-10-CM | POA: Diagnosis not present

## 2017-08-24 DIAGNOSIS — E119 Type 2 diabetes mellitus without complications: Secondary | ICD-10-CM | POA: Diagnosis not present

## 2017-08-24 DIAGNOSIS — E039 Hypothyroidism, unspecified: Secondary | ICD-10-CM | POA: Diagnosis not present

## 2017-08-24 DIAGNOSIS — F0390 Unspecified dementia without behavioral disturbance: Secondary | ICD-10-CM | POA: Diagnosis not present

## 2017-08-24 DIAGNOSIS — I10 Essential (primary) hypertension: Secondary | ICD-10-CM | POA: Diagnosis not present

## 2017-08-24 DIAGNOSIS — M25532 Pain in left wrist: Secondary | ICD-10-CM | POA: Diagnosis not present

## 2017-08-25 DIAGNOSIS — F322 Major depressive disorder, single episode, severe without psychotic features: Secondary | ICD-10-CM | POA: Diagnosis not present

## 2017-09-13 DIAGNOSIS — E039 Hypothyroidism, unspecified: Secondary | ICD-10-CM | POA: Diagnosis not present

## 2017-09-13 DIAGNOSIS — I10 Essential (primary) hypertension: Secondary | ICD-10-CM | POA: Diagnosis not present

## 2017-09-13 DIAGNOSIS — F0390 Unspecified dementia without behavioral disturbance: Secondary | ICD-10-CM | POA: Diagnosis not present

## 2017-09-13 DIAGNOSIS — E119 Type 2 diabetes mellitus without complications: Secondary | ICD-10-CM | POA: Diagnosis not present

## 2017-09-13 DIAGNOSIS — Z9181 History of falling: Secondary | ICD-10-CM | POA: Diagnosis not present

## 2017-09-14 DIAGNOSIS — E039 Hypothyroidism, unspecified: Secondary | ICD-10-CM | POA: Diagnosis not present

## 2017-09-14 DIAGNOSIS — E119 Type 2 diabetes mellitus without complications: Secondary | ICD-10-CM | POA: Diagnosis not present

## 2017-09-14 DIAGNOSIS — F0390 Unspecified dementia without behavioral disturbance: Secondary | ICD-10-CM | POA: Diagnosis not present

## 2017-09-14 DIAGNOSIS — I10 Essential (primary) hypertension: Secondary | ICD-10-CM | POA: Diagnosis not present

## 2017-09-14 DIAGNOSIS — Z9181 History of falling: Secondary | ICD-10-CM | POA: Diagnosis not present

## 2017-09-15 DIAGNOSIS — F0390 Unspecified dementia without behavioral disturbance: Secondary | ICD-10-CM | POA: Diagnosis not present

## 2017-09-15 DIAGNOSIS — I10 Essential (primary) hypertension: Secondary | ICD-10-CM | POA: Diagnosis not present

## 2017-09-15 DIAGNOSIS — E039 Hypothyroidism, unspecified: Secondary | ICD-10-CM | POA: Diagnosis not present

## 2017-09-15 DIAGNOSIS — Z9181 History of falling: Secondary | ICD-10-CM | POA: Diagnosis not present

## 2017-09-15 DIAGNOSIS — E119 Type 2 diabetes mellitus without complications: Secondary | ICD-10-CM | POA: Diagnosis not present

## 2017-09-15 DIAGNOSIS — F331 Major depressive disorder, recurrent, moderate: Secondary | ICD-10-CM | POA: Diagnosis not present

## 2017-09-17 DIAGNOSIS — Z9181 History of falling: Secondary | ICD-10-CM | POA: Diagnosis not present

## 2017-09-17 DIAGNOSIS — E119 Type 2 diabetes mellitus without complications: Secondary | ICD-10-CM | POA: Diagnosis not present

## 2017-09-17 DIAGNOSIS — I10 Essential (primary) hypertension: Secondary | ICD-10-CM | POA: Diagnosis not present

## 2017-09-17 DIAGNOSIS — F0390 Unspecified dementia without behavioral disturbance: Secondary | ICD-10-CM | POA: Diagnosis not present

## 2017-09-17 DIAGNOSIS — E039 Hypothyroidism, unspecified: Secondary | ICD-10-CM | POA: Diagnosis not present

## 2017-09-20 DIAGNOSIS — E039 Hypothyroidism, unspecified: Secondary | ICD-10-CM | POA: Diagnosis not present

## 2017-09-20 DIAGNOSIS — F0390 Unspecified dementia without behavioral disturbance: Secondary | ICD-10-CM | POA: Diagnosis not present

## 2017-09-20 DIAGNOSIS — E119 Type 2 diabetes mellitus without complications: Secondary | ICD-10-CM | POA: Diagnosis not present

## 2017-09-20 DIAGNOSIS — Z9181 History of falling: Secondary | ICD-10-CM | POA: Diagnosis not present

## 2017-09-20 DIAGNOSIS — I10 Essential (primary) hypertension: Secondary | ICD-10-CM | POA: Diagnosis not present

## 2017-09-21 DIAGNOSIS — I10 Essential (primary) hypertension: Secondary | ICD-10-CM | POA: Diagnosis not present

## 2017-09-21 DIAGNOSIS — E119 Type 2 diabetes mellitus without complications: Secondary | ICD-10-CM | POA: Diagnosis not present

## 2017-09-21 DIAGNOSIS — Z9181 History of falling: Secondary | ICD-10-CM | POA: Diagnosis not present

## 2017-09-21 DIAGNOSIS — F0390 Unspecified dementia without behavioral disturbance: Secondary | ICD-10-CM | POA: Diagnosis not present

## 2017-09-21 DIAGNOSIS — E039 Hypothyroidism, unspecified: Secondary | ICD-10-CM | POA: Diagnosis not present

## 2017-09-22 DIAGNOSIS — E039 Hypothyroidism, unspecified: Secondary | ICD-10-CM | POA: Diagnosis not present

## 2017-09-22 DIAGNOSIS — F0390 Unspecified dementia without behavioral disturbance: Secondary | ICD-10-CM | POA: Diagnosis not present

## 2017-09-22 DIAGNOSIS — Z9181 History of falling: Secondary | ICD-10-CM | POA: Diagnosis not present

## 2017-09-22 DIAGNOSIS — E119 Type 2 diabetes mellitus without complications: Secondary | ICD-10-CM | POA: Diagnosis not present

## 2017-09-22 DIAGNOSIS — I10 Essential (primary) hypertension: Secondary | ICD-10-CM | POA: Diagnosis not present

## 2017-09-24 DIAGNOSIS — E119 Type 2 diabetes mellitus without complications: Secondary | ICD-10-CM | POA: Diagnosis not present

## 2017-09-24 DIAGNOSIS — Z9181 History of falling: Secondary | ICD-10-CM | POA: Diagnosis not present

## 2017-09-24 DIAGNOSIS — I10 Essential (primary) hypertension: Secondary | ICD-10-CM | POA: Diagnosis not present

## 2017-09-24 DIAGNOSIS — F0390 Unspecified dementia without behavioral disturbance: Secondary | ICD-10-CM | POA: Diagnosis not present

## 2017-09-24 DIAGNOSIS — E039 Hypothyroidism, unspecified: Secondary | ICD-10-CM | POA: Diagnosis not present

## 2017-09-30 DIAGNOSIS — M79676 Pain in unspecified toe(s): Secondary | ICD-10-CM | POA: Diagnosis not present

## 2017-09-30 DIAGNOSIS — R262 Difficulty in walking, not elsewhere classified: Secondary | ICD-10-CM | POA: Diagnosis not present

## 2017-09-30 DIAGNOSIS — M2010 Hallux valgus (acquired), unspecified foot: Secondary | ICD-10-CM | POA: Diagnosis not present

## 2017-09-30 DIAGNOSIS — B351 Tinea unguium: Secondary | ICD-10-CM | POA: Diagnosis not present

## 2017-10-02 DIAGNOSIS — E119 Type 2 diabetes mellitus without complications: Secondary | ICD-10-CM | POA: Diagnosis not present

## 2017-10-02 DIAGNOSIS — I10 Essential (primary) hypertension: Secondary | ICD-10-CM | POA: Diagnosis not present

## 2017-10-02 DIAGNOSIS — E039 Hypothyroidism, unspecified: Secondary | ICD-10-CM | POA: Diagnosis not present

## 2017-10-02 DIAGNOSIS — Z9181 History of falling: Secondary | ICD-10-CM | POA: Diagnosis not present

## 2017-10-02 DIAGNOSIS — F0390 Unspecified dementia without behavioral disturbance: Secondary | ICD-10-CM | POA: Diagnosis not present

## 2017-10-05 DIAGNOSIS — E039 Hypothyroidism, unspecified: Secondary | ICD-10-CM | POA: Diagnosis not present

## 2017-10-05 DIAGNOSIS — E119 Type 2 diabetes mellitus without complications: Secondary | ICD-10-CM | POA: Diagnosis not present

## 2017-10-05 DIAGNOSIS — I10 Essential (primary) hypertension: Secondary | ICD-10-CM | POA: Diagnosis not present

## 2017-10-05 DIAGNOSIS — F0390 Unspecified dementia without behavioral disturbance: Secondary | ICD-10-CM | POA: Diagnosis not present

## 2017-10-05 DIAGNOSIS — Z9181 History of falling: Secondary | ICD-10-CM | POA: Diagnosis not present

## 2017-10-07 DIAGNOSIS — E119 Type 2 diabetes mellitus without complications: Secondary | ICD-10-CM | POA: Diagnosis not present

## 2017-10-07 DIAGNOSIS — I10 Essential (primary) hypertension: Secondary | ICD-10-CM | POA: Diagnosis not present

## 2017-10-07 DIAGNOSIS — Z9181 History of falling: Secondary | ICD-10-CM | POA: Diagnosis not present

## 2017-10-07 DIAGNOSIS — E039 Hypothyroidism, unspecified: Secondary | ICD-10-CM | POA: Diagnosis not present

## 2017-10-07 DIAGNOSIS — F0390 Unspecified dementia without behavioral disturbance: Secondary | ICD-10-CM | POA: Diagnosis not present

## 2017-10-08 DIAGNOSIS — E119 Type 2 diabetes mellitus without complications: Secondary | ICD-10-CM | POA: Diagnosis not present

## 2017-10-08 DIAGNOSIS — Z9181 History of falling: Secondary | ICD-10-CM | POA: Diagnosis not present

## 2017-10-08 DIAGNOSIS — E039 Hypothyroidism, unspecified: Secondary | ICD-10-CM | POA: Diagnosis not present

## 2017-10-08 DIAGNOSIS — I10 Essential (primary) hypertension: Secondary | ICD-10-CM | POA: Diagnosis not present

## 2017-10-08 DIAGNOSIS — F0390 Unspecified dementia without behavioral disturbance: Secondary | ICD-10-CM | POA: Diagnosis not present

## 2017-10-08 DIAGNOSIS — Z23 Encounter for immunization: Secondary | ICD-10-CM | POA: Diagnosis not present

## 2017-10-10 DIAGNOSIS — Z9181 History of falling: Secondary | ICD-10-CM | POA: Diagnosis not present

## 2017-10-10 DIAGNOSIS — I10 Essential (primary) hypertension: Secondary | ICD-10-CM | POA: Diagnosis not present

## 2017-10-10 DIAGNOSIS — F0390 Unspecified dementia without behavioral disturbance: Secondary | ICD-10-CM | POA: Diagnosis not present

## 2017-10-10 DIAGNOSIS — E039 Hypothyroidism, unspecified: Secondary | ICD-10-CM | POA: Diagnosis not present

## 2017-10-10 DIAGNOSIS — E119 Type 2 diabetes mellitus without complications: Secondary | ICD-10-CM | POA: Diagnosis not present

## 2017-10-11 DIAGNOSIS — D519 Vitamin B12 deficiency anemia, unspecified: Secondary | ICD-10-CM | POA: Diagnosis not present

## 2017-10-11 DIAGNOSIS — Z79899 Other long term (current) drug therapy: Secondary | ICD-10-CM | POA: Diagnosis not present

## 2017-10-13 DIAGNOSIS — F331 Major depressive disorder, recurrent, moderate: Secondary | ICD-10-CM | POA: Diagnosis not present

## 2017-10-14 DIAGNOSIS — E039 Hypothyroidism, unspecified: Secondary | ICD-10-CM | POA: Diagnosis not present

## 2017-10-14 DIAGNOSIS — I10 Essential (primary) hypertension: Secondary | ICD-10-CM | POA: Diagnosis not present

## 2017-10-14 DIAGNOSIS — E119 Type 2 diabetes mellitus without complications: Secondary | ICD-10-CM | POA: Diagnosis not present

## 2017-10-14 DIAGNOSIS — F0390 Unspecified dementia without behavioral disturbance: Secondary | ICD-10-CM | POA: Diagnosis not present

## 2017-10-14 DIAGNOSIS — Z9181 History of falling: Secondary | ICD-10-CM | POA: Diagnosis not present

## 2017-10-21 DIAGNOSIS — I10 Essential (primary) hypertension: Secondary | ICD-10-CM | POA: Diagnosis not present

## 2017-10-21 DIAGNOSIS — E039 Hypothyroidism, unspecified: Secondary | ICD-10-CM | POA: Diagnosis not present

## 2017-10-21 DIAGNOSIS — E119 Type 2 diabetes mellitus without complications: Secondary | ICD-10-CM | POA: Diagnosis not present

## 2017-10-21 DIAGNOSIS — Z9181 History of falling: Secondary | ICD-10-CM | POA: Diagnosis not present

## 2017-10-21 DIAGNOSIS — F0390 Unspecified dementia without behavioral disturbance: Secondary | ICD-10-CM | POA: Diagnosis not present

## 2017-10-27 DIAGNOSIS — F331 Major depressive disorder, recurrent, moderate: Secondary | ICD-10-CM | POA: Diagnosis not present

## 2017-10-30 DIAGNOSIS — E039 Hypothyroidism, unspecified: Secondary | ICD-10-CM | POA: Diagnosis not present

## 2017-10-30 DIAGNOSIS — Z9181 History of falling: Secondary | ICD-10-CM | POA: Diagnosis not present

## 2017-10-30 DIAGNOSIS — I10 Essential (primary) hypertension: Secondary | ICD-10-CM | POA: Diagnosis not present

## 2017-10-30 DIAGNOSIS — E119 Type 2 diabetes mellitus without complications: Secondary | ICD-10-CM | POA: Diagnosis not present

## 2017-10-30 DIAGNOSIS — F0390 Unspecified dementia without behavioral disturbance: Secondary | ICD-10-CM | POA: Diagnosis not present

## 2017-11-02 DIAGNOSIS — F0390 Unspecified dementia without behavioral disturbance: Secondary | ICD-10-CM | POA: Diagnosis not present

## 2017-11-02 DIAGNOSIS — I10 Essential (primary) hypertension: Secondary | ICD-10-CM | POA: Diagnosis not present

## 2017-11-02 DIAGNOSIS — Z9181 History of falling: Secondary | ICD-10-CM | POA: Diagnosis not present

## 2017-11-02 DIAGNOSIS — E119 Type 2 diabetes mellitus without complications: Secondary | ICD-10-CM | POA: Diagnosis not present

## 2017-11-02 DIAGNOSIS — E039 Hypothyroidism, unspecified: Secondary | ICD-10-CM | POA: Diagnosis not present

## 2017-11-10 DIAGNOSIS — F331 Major depressive disorder, recurrent, moderate: Secondary | ICD-10-CM | POA: Diagnosis not present

## 2017-11-24 DIAGNOSIS — F331 Major depressive disorder, recurrent, moderate: Secondary | ICD-10-CM | POA: Diagnosis not present

## 2017-12-01 DIAGNOSIS — F331 Major depressive disorder, recurrent, moderate: Secondary | ICD-10-CM | POA: Diagnosis not present

## 2017-12-02 DIAGNOSIS — E13311 Other specified diabetes mellitus with unspecified diabetic retinopathy with macular edema: Secondary | ICD-10-CM | POA: Diagnosis not present

## 2017-12-02 DIAGNOSIS — M2010 Hallux valgus (acquired), unspecified foot: Secondary | ICD-10-CM | POA: Diagnosis not present

## 2017-12-02 DIAGNOSIS — B351 Tinea unguium: Secondary | ICD-10-CM | POA: Diagnosis not present

## 2017-12-02 DIAGNOSIS — M79676 Pain in unspecified toe(s): Secondary | ICD-10-CM | POA: Diagnosis not present

## 2017-12-07 DIAGNOSIS — G308 Other Alzheimer's disease: Secondary | ICD-10-CM | POA: Diagnosis not present

## 2017-12-07 DIAGNOSIS — R5383 Other fatigue: Secondary | ICD-10-CM | POA: Diagnosis not present

## 2017-12-07 DIAGNOSIS — E038 Other specified hypothyroidism: Secondary | ICD-10-CM | POA: Diagnosis not present

## 2017-12-07 DIAGNOSIS — N189 Chronic kidney disease, unspecified: Secondary | ICD-10-CM | POA: Diagnosis not present

## 2017-12-07 DIAGNOSIS — Z79899 Other long term (current) drug therapy: Secondary | ICD-10-CM | POA: Diagnosis not present

## 2017-12-07 DIAGNOSIS — E119 Type 2 diabetes mellitus without complications: Secondary | ICD-10-CM | POA: Diagnosis not present

## 2017-12-13 DIAGNOSIS — Z79899 Other long term (current) drug therapy: Secondary | ICD-10-CM | POA: Diagnosis not present

## 2017-12-20 DIAGNOSIS — F0390 Unspecified dementia without behavioral disturbance: Secondary | ICD-10-CM | POA: Diagnosis not present

## 2017-12-20 DIAGNOSIS — I10 Essential (primary) hypertension: Secondary | ICD-10-CM | POA: Diagnosis not present

## 2017-12-20 DIAGNOSIS — R5383 Other fatigue: Secondary | ICD-10-CM | POA: Diagnosis not present

## 2017-12-20 DIAGNOSIS — Z833 Family history of diabetes mellitus: Secondary | ICD-10-CM | POA: Diagnosis not present

## 2017-12-20 DIAGNOSIS — R54 Age-related physical debility: Secondary | ICD-10-CM | POA: Diagnosis not present

## 2017-12-20 DIAGNOSIS — R2681 Unsteadiness on feet: Secondary | ICD-10-CM | POA: Diagnosis not present

## 2018-09-21 ENCOUNTER — Emergency Department: Payer: Medicare Other

## 2018-09-21 ENCOUNTER — Emergency Department
Admission: EM | Admit: 2018-09-21 | Discharge: 2018-09-21 | Disposition: A | Discharge status: Home / Self Care | Payer: Medicare Other | Attending: Emergency Medicine | Admitting: Emergency Medicine

## 2018-09-21 ENCOUNTER — Other Ambulatory Visit: Payer: Self-pay

## 2018-09-21 DIAGNOSIS — R062 Wheezing: Secondary | ICD-10-CM | POA: Insufficient documentation

## 2018-09-21 DIAGNOSIS — S0990XA Unspecified injury of head, initial encounter: Secondary | ICD-10-CM | POA: Diagnosis present

## 2018-09-21 DIAGNOSIS — W01198A Fall on same level from slipping, tripping and stumbling with subsequent striking against other object, initial encounter: Secondary | ICD-10-CM | POA: Insufficient documentation

## 2018-09-21 DIAGNOSIS — Y999 Unspecified external cause status: Secondary | ICD-10-CM | POA: Diagnosis not present

## 2018-09-21 DIAGNOSIS — I10 Essential (primary) hypertension: Secondary | ICD-10-CM | POA: Diagnosis not present

## 2018-09-21 DIAGNOSIS — J441 Chronic obstructive pulmonary disease with (acute) exacerbation: Secondary | ICD-10-CM

## 2018-09-21 DIAGNOSIS — R05 Cough: Secondary | ICD-10-CM | POA: Insufficient documentation

## 2018-09-21 DIAGNOSIS — S40212A Abrasion of left shoulder, initial encounter: Secondary | ICD-10-CM | POA: Diagnosis not present

## 2018-09-21 DIAGNOSIS — E039 Hypothyroidism, unspecified: Secondary | ICD-10-CM | POA: Diagnosis not present

## 2018-09-21 DIAGNOSIS — Y9289 Other specified places as the place of occurrence of the external cause: Secondary | ICD-10-CM | POA: Diagnosis not present

## 2018-09-21 DIAGNOSIS — F0390 Unspecified dementia without behavioral disturbance: Secondary | ICD-10-CM | POA: Diagnosis not present

## 2018-09-21 DIAGNOSIS — Y9389 Activity, other specified: Secondary | ICD-10-CM | POA: Insufficient documentation

## 2018-09-21 DIAGNOSIS — S0003XA Contusion of scalp, initial encounter: Secondary | ICD-10-CM | POA: Insufficient documentation

## 2018-09-21 DIAGNOSIS — E119 Type 2 diabetes mellitus without complications: Secondary | ICD-10-CM | POA: Diagnosis not present

## 2018-09-21 DIAGNOSIS — W19XXXA Unspecified fall, initial encounter: Secondary | ICD-10-CM

## 2018-09-21 LAB — CBC WITH DIFFERENTIAL/PLATELET
Basophils Absolute: 0 10*3/uL (ref 0–0.1)
Basophils Relative: 1 %
Eosinophils Absolute: 0 10*3/uL (ref 0–0.7)
Eosinophils Relative: 1 %
HCT: 36.9 % (ref 35.0–47.0)
Hemoglobin: 12.6 g/dL (ref 12.0–16.0)
Lymphocytes Relative: 13 %
Lymphs Abs: 0.7 10*3/uL — ABNORMAL LOW (ref 1.0–3.6)
MCH: 28.8 pg (ref 26.0–34.0)
MCHC: 34.2 g/dL (ref 32.0–36.0)
MCV: 84.3 fL (ref 80.0–100.0)
Monocytes Absolute: 0.5 10*3/uL (ref 0.2–0.9)
Monocytes Relative: 9 %
Neutro Abs: 4.2 10*3/uL (ref 1.4–6.5)
Neutrophils Relative %: 76 %
Platelets: 147 10*3/uL — ABNORMAL LOW (ref 150–440)
RBC: 4.38 MIL/uL (ref 3.80–5.20)
RDW: 15.2 % — ABNORMAL HIGH (ref 11.5–14.5)
WBC: 5.5 10*3/uL (ref 3.6–11.0)

## 2018-09-21 LAB — BASIC METABOLIC PANEL
Anion gap: 11 (ref 5–15)
BUN: 16 mg/dL (ref 8–23)
CO2: 25 mmol/L (ref 22–32)
Calcium: 8.8 mg/dL — ABNORMAL LOW (ref 8.9–10.3)
Chloride: 102 mmol/L (ref 98–111)
Creatinine, Ser: 1.02 mg/dL — ABNORMAL HIGH (ref 0.44–1.00)
GFR calc Af Amer: 57 mL/min — ABNORMAL LOW (ref >60)
GFR calc non Af Amer: 49 mL/min — ABNORMAL LOW (ref >60)
Glucose, Bld: 146 mg/dL — ABNORMAL HIGH (ref 70–99)
Potassium: 3.5 mmol/L (ref 3.5–5.1)
Sodium: 138 mmol/L (ref 135–145)

## 2018-09-21 LAB — BRAIN NATRIURETIC PEPTIDE: B Natriuretic Peptide: 96 pg/mL (ref 0.0–100.0)

## 2018-09-21 LAB — TROPONIN I: Troponin I: <0.03 ng/mL (ref <0.03)

## 2018-09-21 MED ORDER — METHYLPREDNISOLONE SODIUM SUCC 125 MG IJ SOLR
125.0000 mg | Freq: Once | INTRAMUSCULAR | Status: AC
  Administered 2018-09-21: 125 mg via INTRAVENOUS
  Filled 2018-09-21: qty 2

## 2018-09-21 MED ORDER — AZITHROMYCIN 250 MG PO TABS: ORAL_TABLET | ORAL | 0 refills | Status: DC

## 2018-09-21 MED ORDER — PREDNISONE 50 MG PO TABS: ORAL_TABLET | ORAL | 0 refills | Status: DC

## 2018-09-21 MED ORDER — IPRATROPIUM-ALBUTEROL 0.5-2.5 (3) MG/3ML IN SOLN
3.0000 mL | Freq: Once | RESPIRATORY_TRACT | Status: AC
  Administered 2018-09-21: 3 mL via RESPIRATORY_TRACT
  Filled 2018-09-21: qty 3

## 2018-09-21 NOTE — ED Notes (Signed)
Patient transported to CT 

## 2018-09-21 NOTE — ED Notes (Signed)
Pt transported back to [facility] via [facility], pt's DNR on packet that was given to EMS. Pt is alert and visualized in NAD at time of D/C.

## 2018-09-21 NOTE — ED Triage Notes (Signed)
Pt arrived to ED via [facility] with c/c of fall this AM with contusion to back of head and secondary complaint of dry, unproductive cough. Pt reported to have fallen from her bed, loss of consciousness unknown. EMS observed active cough with wheezing. EMS tx with duoneb, 20 IV R hand.

## 2018-09-21 NOTE — ED Provider Notes (Signed)
[facility] Emergency Department Provider Note       Time seen: ----------------------------------------- 9:28 AM on 09/21/2018 -----------------------------------------   I have reviewed the triage vital signs and the nursing notes.  HISTORY   Chief Complaint Fall and Cough    HPI Sandra Kaufman is a 84 y.o. female with a history of COPD, GERD, CVA, hyperlipidemia, hypertension, peripheral neuropathy who presents to the ED for a fall.  Patient reportedly is a resident of [facility] where she was sitting on the edge of her bed, fell back and hit her head.  She was also noted to have a cough.  Patient states she has had a cough for some time.  She denies fevers, chills or other complaints.  Past Medical History:  Diagnosis Date  . Arthritis   . Cancer ([place])    skin  . Colon polyps   . COPD (chronic obstructive pulmonary disease) ([place])   . Diverticulosis   . Frequent falls   . GERD (gastroesophageal reflux disease)    with esophageal stricture requiring dilatation x 2 (12/96 and 10/99)  . Glaucoma   . History of chicken pox   . History of CVA (cerebrovascular accident)   . Hypercholesterolemia   . Hypertension   . Hypothyroidism   . Migraines   . Peripheral neuropathy ([place])   . Urinary incontinence     Patient Active Problem List   Diagnosis Date Noted  . Ileus ([place]) 08/27/2016  . Multiple falls 07/22/2016  . Dementia 04/22/2016  . Fracture of femoral neck, left ([place]) 04/16/2016  . Fracture, femur, neck, left, closed, initial encounter 04/16/2016  . Vomiting 03/17/2016  . Rash 03/17/2016  . Diarrhea 02/18/2016  . Adult hypothyroidism 01/29/2016  . Fall 01/19/2016  . Anemia 10/20/2015  . Headache, migraine 10/10/2015  . HLD (hyperlipidemia) 10/10/2015  . Glaucoma 10/10/2015  . Chronic obstructive pulmonary disease ([place]) 10/10/2015  . Arthritis 10/10/2015  . Hypertrophic toenail 07/23/2015  . Health care maintenance 05/27/2015  .  Difficulty in walking 10/04/2014  . Cerebral ataxia ([place]) 10/04/2014  . Unsteady gait 08/22/2014  . Obesity 08/22/2014  . Dysphagia 07/14/2014  . Amnesia 05/24/2014  . B12 deficiency 05/24/2014  . 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 SOB (shortness of breath) 05/06/2014  . CVA (cerebral vascular accident) ([place]) 03/04/2014  . Diverticulosis 04/29/2013  . GERD (gastroesophageal reflux disease) 11/27/2012  . Urinary incontinence 11/27/2012  . Ovarian cyst 11/27/2012  . Depression 11/27/2012  . Neuropathy 11/27/2012  . Hypothyroidism 11/15/2012  . Hypercholesterolemia 11/15/2012  . Diabetes mellitus ([place]) 11/15/2012  . Hypertension 11/15/2012    Past Surgical History:  Procedure Laterality Date  . ABDOMINAL HYSTERECTOMY  1961   ovaries not removed  . APPENDECTOMY    . bladder tack  1976  . CATARACT EXTRACTION  1997   bilateral  . CHOLECYSTECTOMY    . HIP ARTHROPLASTY Left 04/16/2016   Procedure: ARTHROPLASTY BIPOLAR HIP (HEMIARTHROPLASTY);  Surgeon: Corky Mull, MD;  Location: [facility] ORS;  Service: Orthopedics;  Laterality: Left;  . INTERSTIM IMPLANT PLACEMENT  2011  . TONSILLECTOMY      Allergies Tramadol  Social History Social History   Tobacco Use  . Smoking status: Never Smoker  . Smokeless tobacco: Never Used  Substance Use Topics  . Alcohol use: No    Alcohol/week: 0.0 standard drinks  . Drug use: No   Review of Systems Constitutional: Negative for fever. Cardiovascular: Negative for chest pain. Respiratory: Negative for shortness of breath.  Positive  for cough Gastrointestinal: Negative for abdominal pain, vomiting and diarrhea. Musculoskeletal: Negative for back pain. Skin: Positive for scalp contusion, left upper back abrasion Neurological: Negative for headaches, focal weakness or numbness.  All systems negative/normal/unremarkable except as stated in the HPI  ____________________________________________   PHYSICAL EXAM:  VITAL SIGNS: ED Triage  Vitals  Enc Vitals Group     BP      Pulse      Resp      Temp      Temp src      SpO2      Weight      Height      Head Circumference      Peak Flow      Pain Score      Pain Loc      Pain Edu?      Excl. in [place]?    Constitutional: Alert and oriented. Well appearing and in no distress. Eyes: Conjunctivae are normal. Normal extraocular movements. ENT   Head: Normocephalic, posterior scalp hematoma   Nose: No congestion/rhinnorhea.   Mouth/Throat: Mucous membranes are moist.   Neck: No stridor. Cardiovascular: Normal rate, regular rhythm. No murmurs, rubs, or gallops. Respiratory: Normal respiratory effort without tachypnea nor retractions.  Wheezing and rhonchi bilaterally Gastrointestinal: Soft and nontender. Normal bowel sounds Musculoskeletal: Nontender with normal range of motion in extremities. No lower extremity tenderness nor edema. Neurologic:  Normal speech and language. No gross focal neurologic deficits are appreciated.  Skin: There is a posterior scalp contusion, large abrasion over her left scapula Psychiatric: Mood and affect are normal. Speech and behavior are normal.  ____________________________________________  EKG: Interpreted by me.  Sinus rhythm the rate of 74 bpm, right bundle branch block, wide QRS, normal QT  ____________________________________________  ED COURSE:  As part of my medical decision making, I reviewed the following data within the [place] History obtained from family if available, nursing notes, old chart and ekg, as well as notes from prior ED visits. Patient presented for shortness of breath with recent fall and head injury, we will assess with labs and imaging as indicated at this time.   Procedures ____________________________________________   LABS (pertinent positives/negatives)  Labs Reviewed  CBC WITH DIFFERENTIAL/PLATELET - Abnormal; Notable for the following components:      Result Value    RDW 15.2 (*)    Platelets 147 (*)    Lymphs Abs 0.7 (*)    All other components within normal limits  BASIC METABOLIC PANEL - Abnormal; Notable for the following components:   Glucose, Bld 146 (*)    Creatinine, Ser 1.02 (*)    Calcium 8.8 (*)    GFR calc non Af Amer 49 (*)    GFR calc Af Amer 57 (*)    All other components within normal limits  TROPONIN I  BRAIN NATRIURETIC PEPTIDE    RADIOLOGY Images were viewed by me  Chest x-ray, CT head IMPRESSION: 1. Stable atrophy with periventricular small vessel disease. Prior small infarcts in the genu of the right internal capsule and mid left thalamus. No acute infarct evident. No mass or hemorrhage.  2.  Foci of arterial vascular calcification noted.  3.  Small midline parietal scalp hematoma.  No underlying fracture.  4. Mucosal thickening in several ethmoid air cells. Probable cerumen in each external auditory canal. IMPRESSION: No edema or consolidation.  No pneumothorax.  There is aortic atherosclerosis.  There is a focal hiatal hernia.  Aortic Atherosclerosis (ICD10-I70.0).  ____________________________________________  DIFFERENTIAL DIAGNOSIS   Head injury, subdural, contusion, COPD, pneumonia  FINAL ASSESSMENT AND PLAN  Fall, minor head injury, COPD exacerbation, abrasion   Plan: The patient had presented for a fall, she was also noted to have wheezing on arrival. Patient's labs were grossly unremarkable. Patient's imaging did not reveal any acute process.  She will need several days of steroids as well as antibiotics for COPD.  Otherwise she is cleared for outpatient follow-up.   Laurence Aly, MD   Note: This note was generated in part or whole with voice recognition software. Voice recognition is usually quite accurate but there are transcription errors that can and very often do occur. I apologize for any typographical errors that were not detected and corrected.     Earleen Newport,  MD 09/21/18 1045

## 2018-09-21 NOTE — ED Notes (Addendum)
Pt ready for discharge. Called [facility] to see if they could send a vehicle to pick her up. The [facility] [employer] staff stated it was the responsibility of the patients family to pick her up. I attempted to call patients son [name] who did not answer. Will arrange for EMS transport.

## 2018-09-21 NOTE — ED Notes (Signed)
Informed pt about transport back to [facility] via EMS. Pt moved to ED 1H to await transport.

## 2018-09-21 NOTE — ED Notes (Signed)
Pt stated she hasn't eaten since last night and requested meal. Dr Jimmye Norman notified and pt given graham crackers and peanut butter.

## 2018-09-21 NOTE — ED Notes (Signed)
This RN called [facility] and spoke with Jan who stated that she was the nurse responsible for patient at nursing home, this RN reviewed D/C instructions with Jan who states understanding.

## 2018-09-21 NOTE — ED Notes (Signed)
Pt's DNR placed with paperwork to be returned to facility via [facility].

## 2018-09-21 NOTE — ED Notes (Signed)
Exam of pt scalp shows 2-3cm contusion to posterior scalp, pt denies tenderness to palpation. Large (6-7cm) superficial abrasion noted on posterior left shoulder. EDP notified

## 2019-10-03 ENCOUNTER — Telehealth: Payer: Self-pay | Admitting: Internal Medicine

## 2019-10-03 NOTE — Telephone Encounter (Signed)
Asked to remove PCP per AWV care guide due to patient not being seen in 7yrs.

## 2019-10-03 NOTE — Telephone Encounter (Signed)
-----   Message from Salvatore Marvel sent at 10/03/2019 10:23 AM EDT ----- Regarding: remove pcp Pt has not been seen in 3 years.   Thank you

## 2019-11-12 ENCOUNTER — Encounter: Payer: Self-pay | Admitting: Emergency Medicine

## 2019-11-12 ENCOUNTER — Emergency Department: Payer: Medicare Other

## 2019-11-12 ENCOUNTER — Emergency Department
Admission: EM | Admit: 2019-11-12 | Discharge: 2019-11-12 | Disposition: A | Discharge status: Home / Self Care | Payer: Medicare Other | Attending: Emergency Medicine | Admitting: Emergency Medicine

## 2019-11-12 ENCOUNTER — Other Ambulatory Visit: Payer: Self-pay

## 2019-11-12 DIAGNOSIS — F0390 Unspecified dementia without behavioral disturbance: Secondary | ICD-10-CM | POA: Diagnosis not present

## 2019-11-12 DIAGNOSIS — Z96642 Presence of left artificial hip joint: Secondary | ICD-10-CM | POA: Insufficient documentation

## 2019-11-12 DIAGNOSIS — Z79899 Other long term (current) drug therapy: Secondary | ICD-10-CM | POA: Insufficient documentation

## 2019-11-12 DIAGNOSIS — J449 Chronic obstructive pulmonary disease, unspecified: Secondary | ICD-10-CM | POA: Insufficient documentation

## 2019-11-12 DIAGNOSIS — E1140 Type 2 diabetes mellitus with diabetic neuropathy, unspecified: Secondary | ICD-10-CM | POA: Diagnosis not present

## 2019-11-12 DIAGNOSIS — I10 Essential (primary) hypertension: Secondary | ICD-10-CM | POA: Insufficient documentation

## 2019-11-12 DIAGNOSIS — Y998 Other external cause status: Secondary | ICD-10-CM | POA: Insufficient documentation

## 2019-11-12 DIAGNOSIS — S7002XA Contusion of left hip, initial encounter: Secondary | ICD-10-CM

## 2019-11-12 DIAGNOSIS — S79912A Unspecified injury of left hip, initial encounter: Secondary | ICD-10-CM | POA: Diagnosis present

## 2019-11-12 DIAGNOSIS — Y92129 Unspecified place in nursing home as the place of occurrence of the external cause: Secondary | ICD-10-CM | POA: Diagnosis not present

## 2019-11-12 DIAGNOSIS — Y9389 Activity, other specified: Secondary | ICD-10-CM | POA: Insufficient documentation

## 2019-11-12 DIAGNOSIS — Z7982 Long term (current) use of aspirin: Secondary | ICD-10-CM | POA: Insufficient documentation

## 2019-11-12 DIAGNOSIS — E039 Hypothyroidism, unspecified: Secondary | ICD-10-CM | POA: Insufficient documentation

## 2019-11-12 DIAGNOSIS — W19XXXA Unspecified fall, initial encounter: Secondary | ICD-10-CM | POA: Diagnosis not present

## 2019-11-12 DIAGNOSIS — Z85828 Personal history of other malignant neoplasm of skin: Secondary | ICD-10-CM | POA: Diagnosis not present

## 2019-11-12 NOTE — ED Triage Notes (Signed)
Pt presents from [facility] via [facility] with c/o unwitnessed fall.  Pt c/o pain in left hip. EMS reports slight shortening of affected extremity.DNR papers sent with pt. Pulses 2+ in affected extremity.

## 2019-11-12 NOTE — ED Notes (Signed)
Pt transported to xray 

## 2019-11-12 NOTE — ED Provider Notes (Signed)
[facility] Emergency Department Provider Note ____________________________________________   First MD Initiated Contact with Patient 11/12/19 1805     (approximate)  I have reviewed the triage vital signs and the nursing notes.   HISTORY  Chief Complaint Fall  Level 5 caveat: History of present illness limited due to dementia  HPI Sandra Kaufman is a 85 y.o. female who presents with left hip and low back pain after an unwitnessed fall.  The patient states that she thinks she fell and hit a door on her way down.  She denies hitting her head or passing out.  Past Medical History:  Diagnosis Date  . Arthritis   . Cancer ([place])    skin  . Colon polyps   . COPD (chronic obstructive pulmonary disease) ([place])   . Diverticulosis   . Frequent falls   . GERD (gastroesophageal reflux disease)    with esophageal stricture requiring dilatation x 2 (12/96 and 10/99)  . Glaucoma   . History of chicken pox   . History of CVA (cerebrovascular accident)   . Hypercholesterolemia   . Hypertension   . Hypothyroidism   . Migraines   . Peripheral neuropathy   . Urinary incontinence     Patient Active Problem List   Diagnosis Date Noted  . Ileus ([place]) 08/27/2016  . Multiple falls 07/22/2016  . Dementia ([place]) 04/22/2016  . Fracture of femoral neck, left ([place]) 04/16/2016  . Fracture, femur, neck, left, closed, initial encounter 04/16/2016  . Vomiting 03/17/2016  . Rash 03/17/2016  . Diarrhea 02/18/2016  . Adult hypothyroidism 01/29/2016  . Fall 01/19/2016  . Anemia 10/20/2015  . Headache, migraine 10/10/2015  . HLD (hyperlipidemia) 10/10/2015  . Glaucoma 10/10/2015  . Chronic obstructive pulmonary disease ([place]) 10/10/2015  . Arthritis 10/10/2015  . Hypertrophic toenail 07/23/2015  . Health care maintenance 05/27/2015  . Difficulty in walking 10/04/2014  . Cerebral ataxia ([place]) 10/04/2014  . Unsteady gait 08/22/2014  . Obesity 08/22/2014  . Dysphagia  07/14/2014  . Amnesia 05/24/2014  . B12 deficiency 05/24/2014  . 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 SOB (shortness of breath) 05/06/2014  . CVA (cerebral vascular accident) ([place]) 03/04/2014  . Diverticulosis 04/29/2013  . GERD (gastroesophageal reflux disease) 11/27/2012  . Urinary incontinence 11/27/2012  . Ovarian cyst 11/27/2012  . Depression 11/27/2012  . Neuropathy 11/27/2012  . Hypothyroidism 11/15/2012  . Hypercholesterolemia 11/15/2012  . Diabetes mellitus ([place]) 11/15/2012  . Hypertension 11/15/2012    Past Surgical History:  Procedure Laterality Date  . ABDOMINAL HYSTERECTOMY  1961   ovaries not removed  . APPENDECTOMY    . bladder tack  1976  . CATARACT EXTRACTION  1997   bilateral  . CHOLECYSTECTOMY    . HIP ARTHROPLASTY Left 04/16/2016   Procedure: ARTHROPLASTY BIPOLAR HIP (HEMIARTHROPLASTY);  Surgeon: Corky Mull, MD;  Location: [facility] ORS;  Service: Orthopedics;  Laterality: Left;  . INTERSTIM IMPLANT PLACEMENT  2011  . TONSILLECTOMY      Prior to Admission medications   Medication Sig Start Date End Date Taking? Authorizing Provider  aspirin 325 MG tablet Take 325 mg by mouth daily.   Yes [provider]  atorvastatin (LIPITOR) 40 MG tablet Take 40 mg by mouth at bedtime.   Yes [provider]  bimatoprost (LUMIGAN) 0.01 % SOLN Place 1 drop into both eyes at bedtime.   Yes [provider]  Calcium Carbonate (CALCIUM OYSTER SHELL) 1250 (500 Ca) MG TABS Take 500 mg by mouth daily.  Yes [provider]  Coenzyme Q10 (CO Q10) 100 MG CAPS Take 100 mg by mouth daily.    Yes [provider]  fluticasone (FLONASE) 50 MCG/ACT nasal spray Place 2 sprays into both nostrils daily.   Yes [provider]  levothyroxine (SYNTHROID) 112 MCG tablet Take 100 mcg by mouth daily before breakfast.    Yes [provider]  LORazepam (ATIVAN) 0.5 MG tablet Take 0.5 mg by mouth 3 (three) times daily as needed. 11/02/19  Yes  [provider]  memantine (NAMENDA) 10 MG tablet Take 10 mg by mouth 2 (two) times daily.   Yes [provider]  metoprolol tartrate (LOPRESSOR) 25 MG tablet Take 1 tablet (25 mg total) by mouth 2 (two) times daily. Patient taking differently: Take 12.5 mg by mouth 2 (two) times daily.  07/24/16  Yes Wieting, Richard, MD  Multiple Vitamin (TAB-A-VITE) TABS Take 1 tablet by mouth daily.   Yes [provider]  pantoprazole (PROTONIX) 40 MG tablet Take 1 tablet (40 mg total) by mouth daily. 03/09/16  Yes Lucilla Lame, MD  sertraline (ZOLOFT) 100 MG tablet Take 200 mg by mouth daily.    Yes [provider]  vitamin B-12 (CYANOCOBALAMIN) 1000 MCG tablet Take 1,000 mcg by mouth 2 (two) times a week.    Yes [provider]  acetaminophen (TYLENOL) 325 MG tablet Take 650 mg by mouth 3 (three) times daily as needed for headache (neck pain).    [provider]  albuterol (PROVENTIL HFA;VENTOLIN HFA) 108 (90 BASE) MCG/ACT inhaler Inhale 2 puffs into the lungs every 6 (six) hours as needed for wheezing or shortness of breath. Patient not taking: Reported on 09/21/2018 06/14/15   Rubbie Battiest, RN  docusate sodium (COLACE) 100 MG capsule Take 1 capsule (100 mg total) by mouth 2 (two) times daily. Patient not taking: Reported on 09/21/2018 04/17/16   Hillary Bow, MD  feeding supplement, ENSURE ENLIVE, (ENSURE ENLIVE) LIQD Take 237 mLs by mouth 2 (two) times daily between meals. Patient taking differently: Take 1 Bottle by mouth daily.  04/23/16   Gladstone Lighter, MD  guaifenesin (ROBITUSSIN) 100 MG/5ML syrup Take 400 mg by mouth 3 (three) times daily as needed for cough.    [provider]  lactase (LACTAID) 3000 units tablet Take 3,000 Units by mouth 3 (three) times daily as needed (dairy constipation).    [provider]  loperamide (IMODIUM A-D) 2 MG tablet Take 2 mg by mouth as needed for diarrhea or loose stools (Up to 3 doses in 12  hours).    [provider]    Allergies Tramadol  Family History  Problem Relation Age of Onset  . Heart disease Father        myocardial infarction  . Arthritis/Rheumatoid Mother   . Diabetes Sister   . Epilepsy Sister   . Breast cancer Neg Hx   . Colon cancer Neg Hx     Social History Social History   Tobacco Use  . Smoking status: Never Smoker  . Smokeless tobacco: Never Used  Substance Use Topics  . Alcohol use: No    Alcohol/week: 0.0 standard drinks  . Drug use: No    Review of Systems Level 5 caveat: Unable to obtain review of systems due to dementia    ____________________________________________   PHYSICAL EXAM:  VITAL SIGNS: ED Triage Vitals  Enc Vitals Group     BP 11/12/19 1810 127/84     Pulse Rate 11/12/19 1810  67     Resp 11/12/19 1810 15     Temp 11/12/19 1809 98.3 F (36.8 C)     Temp Source 11/12/19 1809 Oral     SpO2 11/12/19 1810 100 %     Weight 11/12/19 1809 156 lb (70.8 kg)     Height 11/12/19 1809 5\' 5"  (1.651 m)     Head Circumference --      Peak Flow --      Pain Score 11/12/19 1809 4     Pain Loc --      Pain Edu? --      Excl. in [place]? --     Constitutional: Alert, oriented x2.  Relatively comfortable appearing. Eyes: Conjunctivae are normal.  Head: Atraumatic. Nose: No congestion/rhinnorhea. Mouth/Throat: Mucous membranes are moist.   Neck: Normal range of motion.  Cardiovascular: Normal rate, regular rhythm.  Good peripheral circulation. Respiratory: Normal respiratory effort.  No retractions. Gastrointestinal: Soft and nontender. No distention.  Genitourinary: No flank tenderness. Musculoskeletal: No lower extremity edema.  Left lower extremity shortened and slightly externally rotated.  Tenderness to the hip laterally, but the patient is able to range the hip and knee.  Mild lumbar midline tenderness. Neurologic:  Normal speech and language.  5/5 motor strength and intact sensation to bilateral lower  extremities. Skin:  Skin is warm and dry. No rash noted. Psychiatric: Mood and affect are normal. Speech and behavior are normal.  ____________________________________________   LABS (all labs ordered are listed, but only abnormal results are displayed)  Labs Reviewed - No data to display ____________________________________________  EKG   ____________________________________________  RADIOLOGY  XR L hip: No acute fracture XR lumbar spine: No acute fracture  ____________________________________________   PROCEDURES  Procedure(s) performed: No  Procedures  Critical Care performed: No ____________________________________________   INITIAL IMPRESSION / ASSESSMENT AND PLAN / ED COURSE  Pertinent labs & imaging results that were available during my care of the patient were reviewed by me and considered in my medical decision making (see chart for details).  85 year old female with PMH as noted above presents after an unwitnessed but apparently mechanical fall with left hip pain and shortening.  Patient also reports some pain in her lower back.  She denies hitting her head.  She has no other acute complaints.  On exam she is relatively well-appearing and her vital signs are normal.  Neurologic exam is nonfocal.  There is no evidence of head trauma.  She has shortening and rotation of the left leg and tenderness to the left hip, with mild lumbar tenderness as well.  We will obtain x-rays of the left hip and lumbar spine and reassess.  ----------------------------------------- 10:00 PM on 11/12/2019 -----------------------------------------  X-rays are negative for acute fractures.  The patient continues to be able to range the left hip without difficulty.  There are no exam findings to suggest an occult fracture.  I suspect that the shortening is likely chronic as the patient already had a hip replacement on that side.  At this time, the patient is stable for discharge back  to her facility.  Return precautions provided.  ____________________________________________   FINAL CLINICAL IMPRESSION(S) / ED DIAGNOSES  Final diagnoses:  Contusion of left hip, initial encounter      NEW MEDICATIONS STARTED DURING THIS VISIT:  New Prescriptions   No medications on file     Note:  This document was prepared using Dragon voice recognition software and may include unintentional dictation errors.    Arta Silence,  MD 11/12/19 2201

## 2019-11-12 NOTE — Discharge Instructions (Addendum)
The x-rays show no signs of a hip or lower back fracture.  Return to the ER for new, worsening, or persistent severe pain, weakness or numbness, or any other new or worsening symptoms that concern you.

## 2019-12-25 ENCOUNTER — Emergency Department: Payer: Medicare Other

## 2019-12-25 ENCOUNTER — Emergency Department
Admission: EM | Admit: 2019-12-25 | Discharge: 2019-12-25 | Disposition: A | Discharge status: Skilled Nursing Facility | Payer: Medicare Other | Attending: Emergency Medicine | Admitting: Emergency Medicine

## 2019-12-25 ENCOUNTER — Encounter: Payer: Self-pay | Admitting: Emergency Medicine

## 2019-12-25 ENCOUNTER — Other Ambulatory Visit: Payer: Self-pay

## 2019-12-25 DIAGNOSIS — E119 Type 2 diabetes mellitus without complications: Secondary | ICD-10-CM | POA: Diagnosis not present

## 2019-12-25 DIAGNOSIS — Y9389 Activity, other specified: Secondary | ICD-10-CM | POA: Diagnosis not present

## 2019-12-25 DIAGNOSIS — S8011XA Contusion of right lower leg, initial encounter: Secondary | ICD-10-CM | POA: Diagnosis not present

## 2019-12-25 DIAGNOSIS — Y9289 Other specified places as the place of occurrence of the external cause: Secondary | ICD-10-CM | POA: Insufficient documentation

## 2019-12-25 DIAGNOSIS — F0390 Unspecified dementia without behavioral disturbance: Secondary | ICD-10-CM | POA: Diagnosis not present

## 2019-12-25 DIAGNOSIS — J449 Chronic obstructive pulmonary disease, unspecified: Secondary | ICD-10-CM | POA: Insufficient documentation

## 2019-12-25 DIAGNOSIS — Y999 Unspecified external cause status: Secondary | ICD-10-CM | POA: Diagnosis not present

## 2019-12-25 DIAGNOSIS — E039 Hypothyroidism, unspecified: Secondary | ICD-10-CM | POA: Insufficient documentation

## 2019-12-25 DIAGNOSIS — W19XXXA Unspecified fall, initial encounter: Secondary | ICD-10-CM | POA: Diagnosis not present

## 2019-12-25 DIAGNOSIS — Z79899 Other long term (current) drug therapy: Secondary | ICD-10-CM | POA: Insufficient documentation

## 2019-12-25 DIAGNOSIS — I10 Essential (primary) hypertension: Secondary | ICD-10-CM | POA: Diagnosis not present

## 2019-12-25 DIAGNOSIS — Z7982 Long term (current) use of aspirin: Secondary | ICD-10-CM | POA: Diagnosis not present

## 2019-12-25 DIAGNOSIS — S8991XA Unspecified injury of right lower leg, initial encounter: Secondary | ICD-10-CM | POA: Diagnosis present

## 2019-12-25 DIAGNOSIS — T148XXA Other injury of unspecified body region, initial encounter: Secondary | ICD-10-CM

## 2019-12-25 NOTE — ED Triage Notes (Signed)
Presents via EMS s/p fall   Hematoma to right leg

## 2019-12-25 NOTE — ED Provider Notes (Signed)
[facility] Emergency Department Provider Note   ____________________________________________    I have reviewed the triage vital signs and the nursing notes.   HISTORY  Chief Complaint Fall     HPI Sandra Kaufman is a 85 y.o. female who presents after a fall.  Patient reports she is "clumsy "and lost her balance.  She fell and injured her right shin.  No other injury reported.  No head injury no chest pain no abdominal pain no other extremity injuries.  No back pain.  Not on blood thinners per patient  Past Medical History:  Diagnosis Date  . Arthritis   . Cancer ([place])    skin  . Colon polyps   . COPD (chronic obstructive pulmonary disease) ([place])   . Diverticulosis   . Frequent falls   . GERD (gastroesophageal reflux disease)    with esophageal stricture requiring dilatation x 2 (12/96 and 10/99)  . Glaucoma   . History of chicken pox   . History of CVA (cerebrovascular accident)   . Hypercholesterolemia   . Hypertension   . Hypothyroidism   . Migraines   . Peripheral neuropathy   . Urinary incontinence     Patient Active Problem List   Diagnosis Date Noted  . Ileus ([place]) 08/27/2016  . Multiple falls 07/22/2016  . Dementia ([place]) 04/22/2016  . Fracture of femoral neck, left ([place]) 04/16/2016  . Fracture, femur, neck, left, closed, initial encounter 04/16/2016  . Vomiting 03/17/2016  . Rash 03/17/2016  . Diarrhea 02/18/2016  . Adult hypothyroidism 01/29/2016  . Fall 01/19/2016  . Anemia 10/20/2015  . Headache, migraine 10/10/2015  . HLD (hyperlipidemia) 10/10/2015  . Glaucoma 10/10/2015  . Chronic obstructive pulmonary disease ([place]) 10/10/2015  . Arthritis 10/10/2015  . Hypertrophic toenail 07/23/2015  . Health care maintenance 05/27/2015  . Difficulty in walking 10/04/2014  . Cerebral ataxia ([place]) 10/04/2014  . Unsteady gait 08/22/2014  . Obesity 08/22/2014  . Dysphagia 07/14/2014  . Amnesia 05/24/2014  . B12 deficiency  05/24/2014  . 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 SOB (shortness of breath) 05/06/2014  . CVA (cerebral vascular accident) ([place]) 03/04/2014  . Diverticulosis 04/29/2013  . GERD (gastroesophageal reflux disease) 11/27/2012  . Urinary incontinence 11/27/2012  . Ovarian cyst 11/27/2012  . Depression 11/27/2012  . Neuropathy 11/27/2012  . Hypothyroidism 11/15/2012  . Hypercholesterolemia 11/15/2012  . Diabetes mellitus ([place]) 11/15/2012  . Hypertension 11/15/2012    Past Surgical History:  Procedure Laterality Date  . ABDOMINAL HYSTERECTOMY  1961   ovaries not removed  . APPENDECTOMY    . bladder tack  1976  . CATARACT EXTRACTION  1997   bilateral  . CHOLECYSTECTOMY    . HIP ARTHROPLASTY Left 04/16/2016   Procedure: ARTHROPLASTY BIPOLAR HIP (HEMIARTHROPLASTY);  Surgeon: Corky Mull, MD;  Location: [facility] ORS;  Service: Orthopedics;  Laterality: Left;  . INTERSTIM IMPLANT PLACEMENT  2011  . TONSILLECTOMY      Prior to Admission medications   Medication Sig Start Date End Date Taking? Authorizing Provider  acetaminophen (TYLENOL) 325 MG tablet Take 650 mg by mouth 3 (three) times daily as needed for headache (neck pain).    [provider]  albuterol (PROVENTIL HFA;VENTOLIN HFA) 108 (90 BASE) MCG/ACT inhaler Inhale 2 puffs into the lungs every 6 (six) hours as needed for wheezing or shortness of breath. Patient not taking: Reported on 09/21/2018 06/14/15   Rubbie Battiest, RN  aspirin 325 MG tablet Take 325 mg by mouth daily.  [provider]  atorvastatin (LIPITOR) 40 MG tablet Take 40 mg by mouth at bedtime.    [provider]  bimatoprost (LUMIGAN) 0.01 % SOLN Place 1 drop into both eyes at bedtime.    [provider]  Calcium Carbonate (CALCIUM OYSTER SHELL) 1250 (500 Ca) MG TABS Take 500 mg by mouth daily.    [provider]  Coenzyme Q10 (CO Q10) 100 MG CAPS Take 100 mg by mouth daily.     [provider]  docusate sodium  (COLACE) 100 MG capsule Take 1 capsule (100 mg total) by mouth 2 (two) times daily. Patient not taking: Reported on 09/21/2018 04/17/16   Hillary Bow, MD  feeding supplement, ENSURE ENLIVE, (ENSURE ENLIVE) LIQD Take 237 mLs by mouth 2 (two) times daily between meals. Patient taking differently: Take 1 Bottle by mouth daily.  04/23/16   Gladstone Lighter, MD  fluticasone (FLONASE) 50 MCG/ACT nasal spray Place 2 sprays into both nostrils daily.    [provider]  guaifenesin (ROBITUSSIN) 100 MG/5ML syrup Take 400 mg by mouth 3 (three) times daily as needed for cough.    [provider]  lactase (LACTAID) 3000 units tablet Take 3,000 Units by mouth 3 (three) times daily as needed (dairy constipation).    [provider]  levothyroxine (SYNTHROID) 112 MCG tablet Take 100 mcg by mouth daily before breakfast.     [provider]  loperamide (IMODIUM A-D) 2 MG tablet Take 2 mg by mouth as needed for diarrhea or loose stools (Up to 3 doses in 12 hours).    [provider]  LORazepam (ATIVAN) 0.5 MG tablet Take 0.5 mg by mouth 3 (three) times daily as needed. 11/02/19   [provider]  memantine (NAMENDA) 10 MG tablet Take 10 mg by mouth 2 (two) times daily.    [provider]  metoprolol tartrate (LOPRESSOR) 25 MG tablet Take 1 tablet (25 mg total) by mouth 2 (two) times daily. Patient taking differently: Take 12.5 mg by mouth 2 (two) times daily.  07/24/16   Loletha Grayer, MD  Multiple Vitamin (TAB-A-VITE) TABS Take 1 tablet by mouth daily.    [provider]  pantoprazole (PROTONIX) 40 MG tablet Take 1 tablet (40 mg total) by mouth daily. 03/09/16   Lucilla Lame, MD  sertraline (ZOLOFT) 100 MG tablet Take 200 mg by mouth daily.     [provider]  vitamin B-12 (CYANOCOBALAMIN) 1000 MCG tablet Take 1,000 mcg by mouth 2 (two) times a week.     [provider]     Allergies Tramadol  Family History  Problem  Relation Age of Onset  . Heart disease Father        myocardial infarction  . Arthritis/Rheumatoid Mother   . Diabetes Sister   . Epilepsy Sister   . Breast cancer Neg Hx   . Colon cancer Neg Hx     Social History Social History   Tobacco Use  . Smoking status: Never Smoker  . Smokeless tobacco: Never Used  Substance Use Topics  . Alcohol use: No    Alcohol/week: 0.0 standard drinks  . Drug use: No    Review of Systems  Constitutional: No dizziness  ENT: No neck pain   Gastrointestinal: No abdominal pain.    Musculoskeletal: As above Skin: Bruising to the right shin Neurological: No numbness or weakness    ____________________________________________   PHYSICAL EXAM:  VITAL SIGNS: ED Triage Vitals  Enc Vitals Group  BP 12/25/19 0805 (!) 125/57     Pulse Rate 12/25/19 0805 62     Resp 12/25/19 0805 18     Temp 12/25/19 0805 98.4 F (36.9 C)     Temp Source 12/25/19 0805 Oral     SpO2 12/25/19 0805 99 %     Weight 12/25/19 0805 70.8 kg (156 lb)     Height 12/25/19 0805 1.6 m (5\' 3" )     Head Circumference --      Peak Flow --      Pain Score 12/25/19 0830 0     Pain Loc --      Pain Edu? --      Excl. in [place]? --      Constitutional: Alert and oriented.   Nose: No swelling or epistaxis Mouth/Throat: Mucous membranes are moist.   Cardiovascular: Normal rate, regular rhythm.  No chest wall tenderness palpation Respiratory: Normal respiratory effort.  No retractions.  Musculoskeletal: Hematoma to the right shin, no other abnormalities Neurologic:  Normal speech and language. No gross focal neurologic deficits are appreciated.   Skin:  Skin is warm, dry and intact   ____________________________________________   LABS (all labs ordered are listed, but only abnormal results are displayed)  Labs Reviewed - No data to  display ____________________________________________  EKG   ____________________________________________  RADIOLOGY  X-ray negative for bony normality ____________________________________________   PROCEDURES  Procedure(s) performed: No  Procedures   Critical Care performed: No ____________________________________________   INITIAL IMPRESSION / ASSESSMENT AND PLAN / ED COURSE  Pertinent labs & imaging results that were available during my care of the patient were reviewed by me and considered in my medical decision making (see chart for details).  Patient presents with isolated injury to the right shin, suspect simple hematoma however will obtain imaging to rule out bony injury.  X-ray negative, recommend ice, elevation, outpatient follow-up as needed   ____________________________________________   FINAL CLINICAL IMPRESSION(S) / ED DIAGNOSES  Final diagnoses:  Fall, initial encounter  Hematoma      NEW MEDICATIONS STARTED DURING THIS VISIT:  New Prescriptions   No medications on file     Note:  This document was prepared using Dragon voice recognition software and may include unintentional dictation errors.   Lavonia Drafts, MD 12/25/19 [phone]

## 2019-12-25 NOTE — ED Notes (Signed)
Sitting in wheelchair, no complaints at this time. States she doesn't mind staying in wheelchair in room. Call light in reach.

## 2019-12-25 NOTE — ED Notes (Signed)
[facility]  CALLED  FOR  TRANSPORT 

## 2019-12-26 ENCOUNTER — Emergency Department: Payer: Medicare Other

## 2019-12-26 ENCOUNTER — Emergency Department
Admission: EM | Admit: 2019-12-26 | Discharge: 2019-12-26 | Disposition: A | Discharge status: Home / Self Care | Payer: Medicare Other | Attending: Emergency Medicine | Admitting: Emergency Medicine

## 2019-12-26 ENCOUNTER — Other Ambulatory Visit: Payer: Self-pay

## 2019-12-26 DIAGNOSIS — Y939 Activity, unspecified: Secondary | ICD-10-CM | POA: Diagnosis not present

## 2019-12-26 DIAGNOSIS — I10 Essential (primary) hypertension: Secondary | ICD-10-CM | POA: Diagnosis not present

## 2019-12-26 DIAGNOSIS — Z7982 Long term (current) use of aspirin: Secondary | ICD-10-CM | POA: Insufficient documentation

## 2019-12-26 DIAGNOSIS — E119 Type 2 diabetes mellitus without complications: Secondary | ICD-10-CM | POA: Diagnosis not present

## 2019-12-26 DIAGNOSIS — Z8673 Personal history of transient ischemic attack (TIA), and cerebral infarction without residual deficits: Secondary | ICD-10-CM | POA: Insufficient documentation

## 2019-12-26 DIAGNOSIS — Y92129 Unspecified place in nursing home as the place of occurrence of the external cause: Secondary | ICD-10-CM | POA: Diagnosis not present

## 2019-12-26 DIAGNOSIS — Y999 Unspecified external cause status: Secondary | ICD-10-CM | POA: Insufficient documentation

## 2019-12-26 DIAGNOSIS — W010XXA Fall on same level from slipping, tripping and stumbling without subsequent striking against object, initial encounter: Secondary | ICD-10-CM | POA: Insufficient documentation

## 2019-12-26 DIAGNOSIS — E039 Hypothyroidism, unspecified: Secondary | ICD-10-CM | POA: Insufficient documentation

## 2019-12-26 DIAGNOSIS — Z79899 Other long term (current) drug therapy: Secondary | ICD-10-CM | POA: Insufficient documentation

## 2019-12-26 DIAGNOSIS — F0390 Unspecified dementia without behavioral disturbance: Secondary | ICD-10-CM | POA: Insufficient documentation

## 2019-12-26 DIAGNOSIS — J449 Chronic obstructive pulmonary disease, unspecified: Secondary | ICD-10-CM | POA: Diagnosis not present

## 2019-12-26 DIAGNOSIS — S0990XA Unspecified injury of head, initial encounter: Secondary | ICD-10-CM | POA: Insufficient documentation

## 2019-12-26 DIAGNOSIS — W19XXXA Unspecified fall, initial encounter: Secondary | ICD-10-CM

## 2019-12-26 NOTE — ED Notes (Signed)
Report given to med tech at [facility]

## 2019-12-26 NOTE — Discharge Instructions (Addendum)
Patient had normal head CT today, her exam is otherwise unchanged from prior

## 2019-12-26 NOTE — ED Provider Notes (Signed)
[facility] Emergency Department Provider Note   ____________________________________________    I have reviewed the triage vital signs and the nursing notes.   HISTORY  Chief Complaint Fall     HPI Sandra Kaufman is a 85 y.o. female who presents after a fall.  Patient reports she lost her balance, reportedly she may have hit her head.  She states that she feels well overall.  Is moving all of her extremities well.  Patient was seen yesterday for fall as well.  Patient denies dysuria or frequency to me   Past Medical History:  Diagnosis Date  . Arthritis   . Cancer ([place])    skin  . Colon polyps   . COPD (chronic obstructive pulmonary disease) ([place])   . Diverticulosis   . Frequent falls   . GERD (gastroesophageal reflux disease)    with esophageal stricture requiring dilatation x 2 (12/96 and 10/99)  . Glaucoma   . History of chicken pox   . History of CVA (cerebrovascular accident)   . Hypercholesterolemia   . Hypertension   . Hypothyroidism   . Migraines   . Peripheral neuropathy   . Urinary incontinence     Patient Active Problem List   Diagnosis Date Noted  . Ileus ([place]) 08/27/2016  . Multiple falls 07/22/2016  . Dementia ([place]) 04/22/2016  . Fracture of femoral neck, left ([place]) 04/16/2016  . Fracture, femur, neck, left, closed, initial encounter 04/16/2016  . Vomiting 03/17/2016  . Rash 03/17/2016  . Diarrhea 02/18/2016  . Adult hypothyroidism 01/29/2016  . Fall 01/19/2016  . Anemia 10/20/2015  . Headache, migraine 10/10/2015  . HLD (hyperlipidemia) 10/10/2015  . Glaucoma 10/10/2015  . Chronic obstructive pulmonary disease ([place]) 10/10/2015  . Arthritis 10/10/2015  . Hypertrophic toenail 07/23/2015  . Health care maintenance 05/27/2015  . Difficulty in walking 10/04/2014  . Cerebral ataxia ([place]) 10/04/2014  . Unsteady gait 08/22/2014  . Obesity 08/22/2014  . Dysphagia 07/14/2014  . Amnesia 05/24/2014  . B12 deficiency  05/24/2014  . 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 SOB (shortness of breath) 05/06/2014  . CVA (cerebral vascular accident) ([place]) 03/04/2014  . Diverticulosis 04/29/2013  . GERD (gastroesophageal reflux disease) 11/27/2012  . Urinary incontinence 11/27/2012  . Ovarian cyst 11/27/2012  . Depression 11/27/2012  . Neuropathy 11/27/2012  . Hypothyroidism 11/15/2012  . Hypercholesterolemia 11/15/2012  . Diabetes mellitus ([place]) 11/15/2012  . Hypertension 11/15/2012    Past Surgical History:  Procedure Laterality Date  . ABDOMINAL HYSTERECTOMY  1961   ovaries not removed  . APPENDECTOMY    . bladder tack  1976  . CATARACT EXTRACTION  1997   bilateral  . CHOLECYSTECTOMY    . HIP ARTHROPLASTY Left 04/16/2016   Procedure: ARTHROPLASTY BIPOLAR HIP (HEMIARTHROPLASTY);  Surgeon: Corky Mull, MD;  Location: [facility] ORS;  Service: Orthopedics;  Laterality: Left;  . INTERSTIM IMPLANT PLACEMENT  2011  . TONSILLECTOMY      Prior to Admission medications   Medication Sig Start Date End Date Taking? Authorizing Provider  aspirin 325 MG tablet Take 325 mg by mouth daily.   Yes [provider]  atorvastatin (LIPITOR) 40 MG tablet Take 40 mg by mouth at bedtime.   Yes [provider]  bimatoprost (LUMIGAN) 0.01 % SOLN Place 1 drop into both eyes at bedtime.   Yes [provider]  Calcium Carbonate (CALCIUM OYSTER SHELL) 1250 (500 Ca) MG TABS Take 500 mg by mouth daily.   Yes [provider]  Coenzyme Q10 (CO Q10) 100 MG CAPS Take 100 mg by mouth daily.    Yes [provider]  fluticasone (FLONASE) 50 MCG/ACT nasal spray Place 2 sprays into both nostrils daily.   Yes [provider]  levothyroxine (SYNTHROID) 112 MCG tablet Take 100 mcg by mouth daily before breakfast.    Yes [provider]  LORazepam (ATIVAN) 0.5 MG tablet Take 0.5 mg by mouth 3 (three) times daily as needed. 11/02/19  Yes [provider]  memantine (NAMENDA) 10 MG  tablet Take 10 mg by mouth 2 (two) times daily.   Yes [provider]  metoprolol tartrate (LOPRESSOR) 25 MG tablet Take 1 tablet (25 mg total) by mouth 2 (two) times daily. Patient taking differently: Take 12.5 mg by mouth 2 (two) times daily.  07/24/16  Yes Wieting, Richard, MD  Multiple Vitamin (TAB-A-VITE) TABS Take 1 tablet by mouth daily.   Yes [provider]  pantoprazole (PROTONIX) 40 MG tablet Take 1 tablet (40 mg total) by mouth daily. 03/09/16  Yes Lucilla Lame, MD  sertraline (ZOLOFT) 100 MG tablet Take 200 mg by mouth daily.    Yes [provider]  vitamin B-12 (CYANOCOBALAMIN) 1000 MCG tablet Take 1,000 mcg by mouth 2 (two) times a week.    Yes [provider]  acetaminophen (TYLENOL) 325 MG tablet Take 650 mg by mouth 3 (three) times daily as needed for headache (neck pain).    [provider]  albuterol (PROVENTIL HFA;VENTOLIN HFA) 108 (90 BASE) MCG/ACT inhaler Inhale 2 puffs into the lungs every 6 (six) hours as needed for wheezing or shortness of breath. Patient not taking: Reported on 09/21/2018 06/14/15   Rubbie Battiest, RN  docusate sodium (COLACE) 100 MG capsule Take 1 capsule (100 mg total) by mouth 2 (two) times daily. Patient not taking: Reported on 09/21/2018 04/17/16   Hillary Bow, MD  feeding supplement, ENSURE ENLIVE, (ENSURE ENLIVE) LIQD Take 237 mLs by mouth 2 (two) times daily between meals. Patient taking differently: Take 1 Bottle by mouth daily.  04/23/16   Gladstone Lighter, MD  guaifenesin (ROBITUSSIN) 100 MG/5ML syrup Take 400 mg by mouth 3 (three) times daily as needed for cough.    [provider]  lactase (LACTAID) 3000 units tablet Take 3,000 Units by mouth 3 (three) times daily as needed (dairy constipation).    [provider]  loperamide (IMODIUM A-D) 2 MG tablet Take 2 mg by mouth as needed for diarrhea or loose stools (Up to 3 doses in 12 hours).    [provider]      Allergies Tramadol  Family History  Problem Relation Age of Onset  . Heart disease Father        myocardial infarction  . Arthritis/Rheumatoid Mother   . Diabetes Sister   . Epilepsy Sister   . Breast cancer Neg Hx   . Colon cancer Neg Hx     Social History Social History   Tobacco Use  . Smoking status: Never Smoker  . Smokeless tobacco: Never Used  Substance Use Topics  . Alcohol use: No    Alcohol/week: 0.0 standard drinks  . Drug use: No    Review of Systems  Constitutional: No dizziness Eyes: No visual changes.  ENT: No neck pain Cardiovascular: Denies chest pain. Respiratory: No difficulty breathing Gastrointestinal: No abdominal pain Genitourinary: Negative for dysuria. Musculoskeletal: Negative for back pain. Skin: Negative for laceration Neurological: Negative for headaches    ____________________________________________   PHYSICAL EXAM:  VITAL SIGNS: ED Triage Vitals  Enc Vitals Group     BP 12/26/19 1207 (!) 141/78     Pulse Rate 12/26/19 1207 60     Resp 12/26/19 1207 18     Temp 12/26/19 1207 98.3 F (36.8 C)     Temp Source 12/26/19 1207 Oral     SpO2 12/26/19 1207 100 %     Weight 12/26/19 1208 72.6 kg (160 lb)     Height 12/26/19 1208 1.6 m (5\' 3" )     Head Circumference --      Peak Flow --      Pain Score --      Pain Loc --      Pain Edu? --      Excl. in [place]? --     Constitutional: Alert, no acute distress  Nose: No swelling epistaxis Mouth/Throat: Mucous membranes are moist.   Neck:  Painless ROM, no pain with axial load Cardiovascular: Normal rate, regular rhythm.  Good peripheral circulation.  No chest wall tenderness to palpation Respiratory: Normal respiratory effort.  No retractions. Gastrointestinal: Soft and nontender. No distention.    Musculoskeletal: Warm and well perfused extremities.  No pain with axial load on both hips, full range of motion of all extremities without pain, no vertebral tenderness  palpation neurologic:  Normal speech and language. No gross focal neurologic deficits are appreciated.  Skin:  Skin is warm, dry and intact. No rash noted. Psychiatric: Mood and affect are normal. Speech and behavior are normal.  ____________________________________________   LABS (all labs ordered are listed, but only abnormal results are displayed)  Labs Reviewed - No data to display ____________________________________________  EKG  None ____________________________________________  RADIOLOGY  CT head unremarkable ____________________________________________   PROCEDURES  Procedure(s) performed: No  Procedures   Critical Care performed: No ____________________________________________   INITIAL IMPRESSION / ASSESSMENT AND PLAN / ED COURSE  Pertinent labs & imaging results that were available during my care of the patient were reviewed by me and considered in my medical decision making (see chart for details).  Patient overall well-appearing in no acute distress, given history of possible head injury will obtain CT head.  Otherwise exam is reassuring.  CT head unremarkable, appropriate for discharge at this time    ____________________________________________   FINAL CLINICAL IMPRESSION(S) / ED DIAGNOSES  Final diagnoses:  Fall, initial encounter  Injury of head, initial encounter        Note:  This document was prepared using Dragon voice recognition software and may include unintentional dictation errors.   Lavonia Drafts, MD 12/26/19 1524

## 2019-12-26 NOTE — ED Notes (Signed)
Son [name] informed of pt's discharge status, Media planner informed of need for pt to be taken back to [facility] by EMS

## 2019-12-26 NOTE — ED Notes (Addendum)
Attempted to call both of patient's sons but was unable to leave a message

## 2019-12-26 NOTE — ED Notes (Signed)
Pt unable to sign for transport due to dementia 

## 2019-12-26 NOTE — ED Triage Notes (Addendum)
Pt arrives via [facility] from [facility] memory care for frequent falls and possible UTI. Pt seen yesterday for the same. Pt is alert to self only, hx dementia. Per EMS pt had an unwitnessed fall today at the facility and hit her head and c/o headache. Pt is not on blood thinners bu does take ASA325mg 

## 2020-06-18 ENCOUNTER — Other Ambulatory Visit: Payer: Self-pay

## 2020-06-18 DIAGNOSIS — R1310 Dysphagia, unspecified: Secondary | ICD-10-CM

## 2020-06-18 DIAGNOSIS — R05 Cough: Secondary | ICD-10-CM

## 2020-06-18 DIAGNOSIS — R059 Cough, unspecified: Secondary | ICD-10-CM

## 2020-07-15 ENCOUNTER — Ambulatory Visit
Admission: RE | Admit: 2020-07-15 | Discharge: 2020-07-15 | Disposition: A | Discharge status: Home / Self Care | Payer: Medicare Other | Source: Ambulatory Visit | Attending: Physician Assistant | Admitting: Physician Assistant

## 2020-07-15 ENCOUNTER — Other Ambulatory Visit: Payer: Self-pay

## 2020-07-15 DIAGNOSIS — R05 Cough: Secondary | ICD-10-CM | POA: Diagnosis present

## 2020-07-15 DIAGNOSIS — R059 Cough, unspecified: Secondary | ICD-10-CM

## 2020-07-15 DIAGNOSIS — R1312 Dysphagia, oropharyngeal phase: Secondary | ICD-10-CM | POA: Insufficient documentation

## 2020-07-15 NOTE — Therapy (Signed)
[place], [place], [address] Phone: [phone]   Fax:     Modified Barium Swallow  Patient Details  Name: Sandra Kaufman MRN: [id number] Date of Birth: 05-27-1932 No data recorded  Encounter Date: 07/15/2020   End of Session - 07/15/20 1506    Visit Number 1    Number of Visits 1    Date for SLP Re-Evaluation 07/15/20           Past Medical History:  Diagnosis Date  . Arthritis   . Cancer ([place])    skin  . Colon polyps   . COPD (chronic obstructive pulmonary disease) ([place])   . Diverticulosis   . Frequent falls   . GERD (gastroesophageal reflux disease)    with esophageal stricture requiring dilatation x 2 (12/96 and 10/99)  . Glaucoma   . History of chicken pox   . History of CVA (cerebrovascular accident)   . Hypercholesterolemia   . Hypertension   . Hypothyroidism   . Migraines   . Peripheral neuropathy   . Urinary incontinence     Past Surgical History:  Procedure Laterality Date  . ABDOMINAL HYSTERECTOMY  1961   ovaries not removed  . APPENDECTOMY    . bladder tack  1976  . CATARACT EXTRACTION  1997   bilateral  . CHOLECYSTECTOMY    . HIP ARTHROPLASTY Left 04/16/2016   Procedure: ARTHROPLASTY BIPOLAR HIP (HEMIARTHROPLASTY);  Surgeon: Corky Mull, MD;  Location: [facility] ORS;  Service: Orthopedics;  Laterality: Left;  . INTERSTIM IMPLANT PLACEMENT  2011  . TONSILLECTOMY      There were no vitals filed for this visit.   Subjective: Patient behavior: (alertness, ability to follow instructions, etc.): The patient is alert and oriented to swallowing.  She is confused but conversant.   Chief complaint:   Per MD visit note: "Staff report patient has trouble swallowing X1 month.  Has coughing spells with solids and liquids, including medication.  Staff noticed that patient tries to eat too quickly.  No report of cough or congestion aside from eating and drinking."   Per  documentation during [facility] stay in 2017. ".female with multiple medical problems including Dementia, Esophageal Strictures and Dilatations x3 per Son, Acid Reflux per Son, hiatal hernia per chart CXR note." And "On a GI note, pt has a h/o Esophageal dysmotility and Esophageal strictures w/ dilatations - Son reported GI did not want to "do another dilatation if he didn't have to" d/t the multiple dilatations done and her age. Son reported pt tends to eat foods (Breads, Meats) she shouldn't w/ the degree of Esophageal dysmotility issues. Pt is on a PPI."   Objective:  Radiological Procedure: A videoflouroscopic evaluation of oral-preparatory, reflex initiation, and pharyngeal phases of the swallow was performed; as well as a screening of the upper esophageal phase.  I. POSTURE: Upright in MBS chair  II. VIEW: Lateral  III. COMPENSATORY STRATEGIES: N/A  IV. BOLUSES ADMINISTERED:   Thin Liquid: 1 small, 4 rapid consecutive   Nectar-thick Liquid: 3 rapid consecutive   Honey-thick Liquid: DNT   Puree: 2 teaspoon presentations   Mechanical Soft: 1/4 graham cracker in applesauce  V. RESULTS OF EVALUATION: A. ORAL PREPARATORY PHASE: (The lips, tongue, and velum are observed for strength and coordination)       **Overall Severity Rating: Mild; gulping of liquids, abnormal oral hold with solids  B. SWALLOW INITIATION/REFLEX: (The reflex is normal if "triggered" by the time  the bolus reached the base of the tongue)  **Overall Severity Rating: Mild; triggers while falling from the  valleculae to the pyriform sinuses  C. PHARYNGEAL PHASE: (Pharyngeal function is normal if the bolus shows rapid, smooth, and continuous transit through the pharynx and there is no pharyngeal residue after the swallow)  **Overall Severity Rating: within normal limits  D. LARYNGEAL PENETRATION: (Material entering into the laryngeal inlet/vestibule but not aspirated) Transient with liquid gulps- no laryngeal vestibule  residue  E. ASPIRATION: None  F. ESOPHAGEAL PHASE: (Screening of the upper esophagus): Could not view due to shoulder shadow, patient has extensive esophageal history   ASSESSMENT: This 86 year old woman; with dementia, extensive esophageal history per chart review, and recent increase in coughing with food/liquid; is presenting with mild oropharyngeal dysphagia characterized abnormal oral hold with solids, delayed pharyngeal swallow initiation, and transient laryngeal penetration of liquids.   There is no observed pharyngeal residue or tracheal aspiration.  The patient is not at significant risk for prandial aspiration.  Coughing was not reproduced during this study and does not appear to be due to aspiration.  The abnormal oral hold may be a symptom of dementia.  Recommend home health speech therapy for a clinical swallow assessment in her usual setting for patient/family education, determine appropriate diet consistency, and develop feeding guidelines to maximize oral intake and safety.  PLAN/RECOMMENDATIONS:   A. Diet: Soft, easy to chew foods with thin liquids     B. Swallowing Precautions: Aspiration precautions, reflux precautions, esophageal dysmotility precautions   C. Recommended consultation to: GI (extensive esophageal dysphagia history)   D. Therapy recommendations: Home health speech therapy follow up in her own environment to determine optimal diet consistency and swallowing guidelines per clinical assessment in conjunction with results of MBS and patient wishes.   E. Results and recommendations were discussed with the patient immediately following the study (doubtful she understands) and the final report routed to the referring MD.   Patient will benefit from skilled therapeutic intervention in order to improve the following deficits and impairments:   Oropharyngeal dysphagia - Plan: DG SWALLOW FUNC OP MEDICARE SPEECH PATH, DG SWALLOW FUNC OP MEDICARE SPEECH PATH  Coughing -  Plan: DG SWALLOW FUNC OP MEDICARE SPEECH PATH, DG SWALLOW FUNC OP MEDICARE SPEECH PATH        Problem List Patient Active Problem List   Diagnosis Date Noted  . Ileus ([place]) 08/27/2016  . Multiple falls 07/22/2016  . Dementia ([place]) 04/22/2016  . Fracture of femoral neck, left ([place]) 04/16/2016  . Fracture, femur, neck, left, closed, initial encounter 04/16/2016  . Vomiting 03/17/2016  . Rash 03/17/2016  . Diarrhea 02/18/2016  . Adult hypothyroidism 01/29/2016  . Fall 01/19/2016  . Anemia 10/20/2015  . Headache, migraine 10/10/2015  . HLD (hyperlipidemia) 10/10/2015  . Glaucoma 10/10/2015  . Chronic obstructive pulmonary disease ([place]) 10/10/2015  . Arthritis 10/10/2015  . Hypertrophic toenail 07/23/2015  . Health care maintenance 05/27/2015  . Difficulty in walking 10/04/2014  . Cerebral ataxia ([place]) 10/04/2014  . Unsteady gait 08/22/2014  . Obesity 08/22/2014  . Dysphagia 07/14/2014  . Amnesia 05/24/2014  . B12 deficiency 05/24/2014  . 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 SOB (shortness of breath) 05/06/2014  . CVA (cerebral vascular accident) ([place]) 03/04/2014  . Diverticulosis 04/29/2013  . GERD (gastroesophageal reflux disease) 11/27/2012  . Urinary incontinence 11/27/2012  . Ovarian cyst 11/27/2012  . Depression 11/27/2012  . Neuropathy 11/27/2012  . Hypothyroidism 11/15/2012  . Hypercholesterolemia 11/15/2012  . Diabetes mellitus ([place])  11/15/2012  . Hypertension 11/15/2012   Leroy Sea, MS/CCC- SLP  Lou Miner 07/15/2020, 3:06 PM  [facility] [place], [place], [address] Phone: [phone]   Fax:     Name: Sandra Kaufman MRN: [id number] Date of Birth: 1932-09-29

## 2020-07-21 ENCOUNTER — Other Ambulatory Visit: Payer: Self-pay

## 2020-07-21 ENCOUNTER — Emergency Department
Admission: EM | Admit: 2020-07-21 | Discharge: 2020-07-21 | Disposition: A | Discharge status: Home / Self Care | Payer: Medicare Other | Attending: Emergency Medicine | Admitting: Emergency Medicine

## 2020-07-21 ENCOUNTER — Emergency Department: Payer: Medicare Other

## 2020-07-21 DIAGNOSIS — J449 Chronic obstructive pulmonary disease, unspecified: Secondary | ICD-10-CM | POA: Diagnosis not present

## 2020-07-21 DIAGNOSIS — E119 Type 2 diabetes mellitus without complications: Secondary | ICD-10-CM | POA: Insufficient documentation

## 2020-07-21 DIAGNOSIS — I10 Essential (primary) hypertension: Secondary | ICD-10-CM | POA: Insufficient documentation

## 2020-07-21 DIAGNOSIS — S0001XA Abrasion of scalp, initial encounter: Secondary | ICD-10-CM

## 2020-07-21 DIAGNOSIS — Z85828 Personal history of other malignant neoplasm of skin: Secondary | ICD-10-CM | POA: Diagnosis not present

## 2020-07-21 DIAGNOSIS — S0990XA Unspecified injury of head, initial encounter: Secondary | ICD-10-CM | POA: Diagnosis present

## 2020-07-21 DIAGNOSIS — Y92009 Unspecified place in unspecified non-institutional (private) residence as the place of occurrence of the external cause: Secondary | ICD-10-CM | POA: Insufficient documentation

## 2020-07-21 DIAGNOSIS — Y939 Activity, unspecified: Secondary | ICD-10-CM | POA: Diagnosis not present

## 2020-07-21 DIAGNOSIS — W19XXXA Unspecified fall, initial encounter: Secondary | ICD-10-CM | POA: Diagnosis not present

## 2020-07-21 DIAGNOSIS — Z79899 Other long term (current) drug therapy: Secondary | ICD-10-CM | POA: Diagnosis not present

## 2020-07-21 DIAGNOSIS — E039 Hypothyroidism, unspecified: Secondary | ICD-10-CM | POA: Insufficient documentation

## 2020-07-21 DIAGNOSIS — Z7982 Long term (current) use of aspirin: Secondary | ICD-10-CM | POA: Insufficient documentation

## 2020-07-21 DIAGNOSIS — Y999 Unspecified external cause status: Secondary | ICD-10-CM | POA: Diagnosis not present

## 2020-07-21 DIAGNOSIS — Z96642 Presence of left artificial hip joint: Secondary | ICD-10-CM | POA: Diagnosis not present

## 2020-07-21 MED ORDER — LIDOCAINE-EPINEPHRINE-TETRACAINE (LET) SOLUTION
3.0000 mL | Freq: Once | NASAL | Status: AC
  Administered 2020-07-21: 3 mL via TOPICAL
  Filled 2020-07-21: qty 3

## 2020-07-21 MED ORDER — ACETAMINOPHEN 325 MG PO TABS
650.0000 mg | ORAL_TABLET | Freq: Once | ORAL | Status: AC
  Administered 2020-07-21: 650 mg via ORAL
  Filled 2020-07-21: qty 2

## 2020-07-21 NOTE — ED Notes (Signed)
Pt unable to sign DC d/t dementia. Taken back to [facility] by [facility].

## 2020-07-21 NOTE — ED Notes (Signed)
Pt cleansed of stool and clean brief placed on pt

## 2020-07-21 NOTE — ED Triage Notes (Signed)
Pt fell this AM. Blood noted to shirt and back of head. Pt asking about son. No active bleeding noted. No blood thinners on med list. HOH.

## 2020-07-21 NOTE — ED Notes (Signed)
Pt given sandwich tray 

## 2020-07-21 NOTE — ED Notes (Signed)
Pt had soft brown DM on self. Able to roll side to side for this RN to be cleaned up. Taken to CT once cleaned.

## 2020-07-21 NOTE — ED Provider Notes (Signed)
[facility] Emergency Department Provider Note ____________________________________________  Time seen: 1042  I have reviewed the triage vital signs and the nursing notes.  HISTORY  Chief Complaint  Fall  HPI Sandra Kaufman is a 86 y.o. female presents to the ED via EMS from [facility] where the patient is a resident.   There was report of a mechanical fall this morning.  Patient was found by EMS on the floor.  Patient denies any loss of consciousness but does report some posterior head pain.  There is no active bleeding noted from the scalp and patient is not on any blood thinners according to the med review list.  No other injuries are reported at this time.  Patient is at her baseline in terms of her dementia/amnesia.  Past Medical History:  Diagnosis Date  . Arthritis   . Cancer ([place])    skin  . Colon polyps   . COPD (chronic obstructive pulmonary disease) ([place])   . Diverticulosis   . Frequent falls   . GERD (gastroesophageal reflux disease)    with esophageal stricture requiring dilatation x 2 (12/96 and 10/99)  . Glaucoma   . History of chicken pox   . History of CVA (cerebrovascular accident)   . Hypercholesterolemia   . Hypertension   . Hypothyroidism   . Migraines   . Peripheral neuropathy   . Urinary incontinence     Patient Active Problem List   Diagnosis Date Noted  . Ileus ([place]) 08/27/2016  . Multiple falls 07/22/2016  . Dementia ([place]) 04/22/2016  . Fracture of femoral neck, left ([place]) 04/16/2016  . Fracture, femur, neck, left, closed, initial encounter 04/16/2016  . Vomiting 03/17/2016  . Rash 03/17/2016  . Diarrhea 02/18/2016  . Adult hypothyroidism 01/29/2016  . Fall 01/19/2016  . Anemia 10/20/2015  . Headache, migraine 10/10/2015  . HLD (hyperlipidemia) 10/10/2015  . Glaucoma 10/10/2015  . Chronic obstructive pulmonary disease ([place]) 10/10/2015  . Arthritis 10/10/2015  . Hypertrophic toenail 07/23/2015  . Health care  maintenance 05/27/2015  . Difficulty in walking 10/04/2014  . Cerebral ataxia ([place]) 10/04/2014  . Unsteady gait 08/22/2014  . Obesity 08/22/2014  . Dysphagia 07/14/2014  . Amnesia 05/24/2014  . B12 deficiency 05/24/2014  . 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 SOB (shortness of breath) 05/06/2014  . CVA (cerebral vascular accident) ([place]) 03/04/2014  . Diverticulosis 04/29/2013  . GERD (gastroesophageal reflux disease) 11/27/2012  . Urinary incontinence 11/27/2012  . Ovarian cyst 11/27/2012  . Depression 11/27/2012  . Neuropathy 11/27/2012  . Hypothyroidism 11/15/2012  . Hypercholesterolemia 11/15/2012  . Diabetes mellitus ([place]) 11/15/2012  . Hypertension 11/15/2012    Past Surgical History:  Procedure Laterality Date  . ABDOMINAL HYSTERECTOMY  1961   ovaries not removed  . APPENDECTOMY    . bladder tack  1976  . CATARACT EXTRACTION  1997   bilateral  . CHOLECYSTECTOMY    . HIP ARTHROPLASTY Left 04/16/2016   Procedure: ARTHROPLASTY BIPOLAR HIP (HEMIARTHROPLASTY);  Surgeon: Corky Mull, MD;  Location: [facility] ORS;  Service: Orthopedics;  Laterality: Left;  . INTERSTIM IMPLANT PLACEMENT  2011  . TONSILLECTOMY      Prior to Admission medications   Medication Sig Start Date End Date Taking? Authorizing Provider  acetaminophen (TYLENOL) 325 MG tablet Take 650 mg by mouth 3 (three) times daily as needed for headache (neck pain).    [provider]  albuterol (PROVENTIL HFA;VENTOLIN HFA) 108 (90 BASE) MCG/ACT inhaler Inhale 2 puffs into the lungs every  6 (six) hours as needed for wheezing or shortness of breath. Patient not taking: Reported on 09/21/2018 06/14/15   Rubbie Battiest, RN  aspirin 325 MG tablet Take 325 mg by mouth daily.    [provider]  atorvastatin (LIPITOR) 40 MG tablet Take 40 mg by mouth at bedtime.    [provider]  bimatoprost (LUMIGAN) 0.01 % SOLN Place 1 drop into both eyes at bedtime.    [provider]  Calcium Carbonate  (CALCIUM OYSTER SHELL) 1250 (500 Ca) MG TABS Take 500 mg by mouth daily.    [provider]  Coenzyme Q10 (CO Q10) 100 MG CAPS Take 100 mg by mouth daily.     [provider]  docusate sodium (COLACE) 100 MG capsule Take 1 capsule (100 mg total) by mouth 2 (two) times daily. Patient not taking: Reported on 09/21/2018 04/17/16   Hillary Bow, MD  feeding supplement, ENSURE ENLIVE, (ENSURE ENLIVE) LIQD Take 237 mLs by mouth 2 (two) times daily between meals. Patient taking differently: Take 1 Bottle by mouth daily.  04/23/16   Gladstone Lighter, MD  fluticasone (FLONASE) 50 MCG/ACT nasal spray Place 2 sprays into both nostrils daily.    [provider]  guaifenesin (ROBITUSSIN) 100 MG/5ML syrup Take 400 mg by mouth 3 (three) times daily as needed for cough.    [provider]  lactase (LACTAID) 3000 units tablet Take 3,000 Units by mouth 3 (three) times daily as needed (dairy constipation).    [provider]  levothyroxine (SYNTHROID) 112 MCG tablet Take 100 mcg by mouth daily before breakfast.     [provider]  loperamide (IMODIUM A-D) 2 MG tablet Take 2 mg by mouth as needed for diarrhea or loose stools (Up to 3 doses in 12 hours).    [provider]  LORazepam (ATIVAN) 0.5 MG tablet Take 0.5 mg by mouth 3 (three) times daily as needed. 11/02/19   [provider]  memantine (NAMENDA) 10 MG tablet Take 10 mg by mouth 2 (two) times daily.    [provider]  metoprolol tartrate (LOPRESSOR) 25 MG tablet Take 1 tablet (25 mg total) by mouth 2 (two) times daily. Patient taking differently: Take 12.5 mg by mouth 2 (two) times daily.  07/24/16   Loletha Grayer, MD  Multiple Vitamin (TAB-A-VITE) TABS Take 1 tablet by mouth daily.    [provider]  pantoprazole (PROTONIX) 40 MG tablet Take 1 tablet (40 mg total) by mouth daily. 03/09/16   Lucilla Lame, MD  sertraline (ZOLOFT) 100 MG tablet Take 200 mg by mouth  daily.     [provider]  vitamin B-12 (CYANOCOBALAMIN) 1000 MCG tablet Take 1,000 mcg by mouth 2 (two) times a week.     [provider]    Allergies Tramadol  Family History  Problem Relation Age of Onset  . Heart disease Father        myocardial infarction  . Arthritis/Rheumatoid Mother   . Diabetes Sister   . Epilepsy Sister   . Breast cancer Neg Hx   . Colon cancer Neg Hx     Social History Social History   Tobacco Use  . Smoking status: Never Smoker  . Smokeless tobacco: Never Used  Substance Use Topics  . Alcohol use: No    Alcohol/week: 0.0 standard drinks  . Drug use: No    Review of Systems  Constitutional: Negative for fever. Eyes: Negative for visual changes. ENT: Negative for sore throat.  Cardiovascular: Negative for chest pain. Respiratory: Negative for shortness of breath. Gastrointestinal: Negative for abdominal pain, vomiting and diarrhea. Genitourinary: Negative for dysuria. Musculoskeletal: Negative for back pain. Skin: Negative for rash.  Scalp laceration as above. Neurological: Negative for headaches, focal weakness or numbness. ____________________________________________  PHYSICAL EXAM:  VITAL SIGNS: ED Triage Vitals [07/21/20 1032]  Enc Vitals Group     BP (!) 157/79     Pulse Rate 79     Resp 16     Temp 98.6 F (37 C)     Temp Source Oral     SpO2 100 %     Weight 165 lb (74.8 kg)     Height 5\' 3"  (1.6 m)     Head Circumference      Peak Flow      Pain Score 0     Pain Loc      Pain Edu?      Excl. in [place]?     Constitutional: Alert and oriented. Well appearing and in no distress.  Oriented to person at this time. Head: Normocephalic and atraumatic, except for an abrasion to the posterior scalp. Eyes: Conjunctivae are normal. Normal extraocular movements Ears: Canals clear. TMs intact bilaterally. Nose: No congestion/rhinorrhea/epistaxis. Mouth/Throat: Mucous membranes are moist. Neck: Supple.  Normal  range of motion without crepitus.  No distracting on tenderness is noted. Cardiovascular: Normal rate, regular rhythm. Normal distal pulses. Respiratory: Normal respiratory effort. No wheezes/rales/rhonchi. Gastrointestinal: Soft and nontender. No distention. Musculoskeletal: Nontender with normal range of motion in all extremities.  Neurologic: Normal speech and language. No gross focal neurologic deficits are appreciated. Skin:  Skin is warm, dry and intact. No rash noted. ____________________________________________   RADIOLOGY  CT Head / Cervical Spine w/o CM  IMPRESSION: 1.  No acute brain injury.  2. Chronic ischemic microvascular disease with small old bilateral basal ganglia lacunar infarcts.  3.  No acute cervical spine injury.  4. Moderate spondylosis of the cervical spine with significant disc space narrowing from the C3-4 level to the C6-7 level. Significant bilateral neural foraminal narrowing from the C3-4 level to the C6-7 level. ____________________________________________  PROCEDURES  LET - posterior scalp Tylenol 650 mg PO  Procedures ____________________________________________  INITIAL IMPRESSION / ASSESSMENT AND PLAN / ED COURSE  Geriatric patient with ED evaluation following a mechanical fall at her nursing facility today.  Patient presents to the ED via EMS for evaluation following mechanical fall with complaints of posterior scalp abrasion.  Patient is at baseline according to report.  CT imaging of the head and neck are negative and reassuring at this time.  Patient is pleasant, alert, and without any signs of acute distress at this time.  Should be discharged to her home care facility in stable condition and may take Tylenol as needed for pain.  Sandra Kaufman was evaluated in Emergency Department on 07/21/2020 for the symptoms described in the history of present illness. She was evaluated in the context of the global COVID-19 pandemic, which  necessitated consideration that the patient might be at risk for infection with the SARS-CoV-2 virus that causes COVID-19. Institutional protocols and algorithms that pertain to the evaluation of patients at risk for COVID-19 are in a state of rapid change based on information released by regulatory bodies including the CDC and federal and state organizations. These policies and algorithms were followed during the patient's care in the ED. ____________________________________________  FINAL CLINICAL IMPRESSION(S) / ED DIAGNOSES  Final diagnoses:  Fall in home, initial  encounter  Minor head injury, initial encounter  Abrasion of scalp, initial encounter      Melvenia Needles, PA-C 07/21/20 1206    Carrie Mew, MD 07/21/20 1339

## 2020-07-21 NOTE — ED Triage Notes (Signed)
In via EMS from [facility] with c/o fall. EMS reports pt was found in floor. Pt denies LOC, reports pain in head. 128/78, 72HR

## 2020-07-21 NOTE — Discharge Instructions (Addendum)
Mrs. Landa has a normal exam, including CT images of the head and neck. She has a superficial abrasion to the scalp. Keep ointment on the area to promote healing. Follow-up with the primary provider as needed.

## 2020-08-10 ENCOUNTER — Encounter: Payer: Self-pay | Admitting: Emergency Medicine

## 2020-08-10 ENCOUNTER — Other Ambulatory Visit: Payer: Self-pay

## 2020-08-10 ENCOUNTER — Emergency Department: Payer: Medicare Other

## 2020-08-10 DIAGNOSIS — E039 Hypothyroidism, unspecified: Secondary | ICD-10-CM | POA: Diagnosis not present

## 2020-08-10 DIAGNOSIS — Y998 Other external cause status: Secondary | ICD-10-CM | POA: Insufficient documentation

## 2020-08-10 DIAGNOSIS — M1712 Unilateral primary osteoarthritis, left knee: Secondary | ICD-10-CM | POA: Diagnosis not present

## 2020-08-10 DIAGNOSIS — J449 Chronic obstructive pulmonary disease, unspecified: Secondary | ICD-10-CM | POA: Insufficient documentation

## 2020-08-10 DIAGNOSIS — Y939 Activity, unspecified: Secondary | ICD-10-CM | POA: Insufficient documentation

## 2020-08-10 DIAGNOSIS — Y929 Unspecified place or not applicable: Secondary | ICD-10-CM | POA: Insufficient documentation

## 2020-08-10 DIAGNOSIS — Z79899 Other long term (current) drug therapy: Secondary | ICD-10-CM | POA: Diagnosis not present

## 2020-08-10 DIAGNOSIS — F0390 Unspecified dementia without behavioral disturbance: Secondary | ICD-10-CM | POA: Insufficient documentation

## 2020-08-10 DIAGNOSIS — S8002XA Contusion of left knee, initial encounter: Secondary | ICD-10-CM | POA: Insufficient documentation

## 2020-08-10 DIAGNOSIS — Z7982 Long term (current) use of aspirin: Secondary | ICD-10-CM | POA: Insufficient documentation

## 2020-08-10 DIAGNOSIS — X58XXXA Exposure to other specified factors, initial encounter: Secondary | ICD-10-CM | POA: Diagnosis not present

## 2020-08-10 DIAGNOSIS — E1140 Type 2 diabetes mellitus with diabetic neuropathy, unspecified: Secondary | ICD-10-CM | POA: Insufficient documentation

## 2020-08-10 DIAGNOSIS — I10 Essential (primary) hypertension: Secondary | ICD-10-CM | POA: Insufficient documentation

## 2020-08-10 DIAGNOSIS — C4491 Basal cell carcinoma of skin, unspecified: Secondary | ICD-10-CM | POA: Insufficient documentation

## 2020-08-10 DIAGNOSIS — M25562 Pain in left knee: Secondary | ICD-10-CM | POA: Diagnosis present

## 2020-08-10 MED ORDER — IBUPROFEN 400 MG PO TABS
400.0000 mg | ORAL_TABLET | Freq: Once | ORAL | Status: AC | PRN
  Administered 2020-08-10: 400 mg via ORAL
  Filled 2020-08-10: qty 1

## 2020-08-10 NOTE — ED Notes (Signed)
Pt assisted to the bathroom by [employer] EDT.

## 2020-08-10 NOTE — ED Notes (Signed)
Pt asking about her purse, pt informed she did not bring a purse with her.

## 2020-08-10 NOTE — ED Triage Notes (Signed)
First RN Note: Pt presents to ED via [facility] from [facility] with c/o L knee pain since 1530 today. Per EMS VSS, states facility gave Tylenol which relieved pain until it worse off and then patient began screaming in pain. Upon arrival to ED pt sitting in wheelchair, no apparent distress or discomfort noted at this time.

## 2020-08-10 NOTE — ED Triage Notes (Signed)
Patient with complaint of left knee pain that started today. Patient denies injury.

## 2020-08-11 ENCOUNTER — Emergency Department
Admission: EM | Admit: 2020-08-11 | Discharge: 2020-08-11 | Disposition: A | Discharge status: Home / Self Care | Payer: Medicare Other | Attending: Emergency Medicine | Admitting: Emergency Medicine

## 2020-08-11 DIAGNOSIS — M25562 Pain in left knee: Secondary | ICD-10-CM

## 2020-08-11 DIAGNOSIS — M1712 Unilateral primary osteoarthritis, left knee: Secondary | ICD-10-CM

## 2020-08-11 DIAGNOSIS — S8002XA Contusion of left knee, initial encounter: Secondary | ICD-10-CM

## 2020-08-11 NOTE — ED Provider Notes (Signed)
[facility] Emergency Department Provider Note  ____________________________________________   First MD Initiated Contact with Patient 08/11/20 0059     (approximate)  I have reviewed the triage vital signs and the nursing notes.   HISTORY  Chief Complaint Knee Pain  Level 5 caveat:  history/ROS limited by chronic dementia  HPI Sandra Kaufman is a 86 y.o. female whose medical history includes dementia and frequent falls.  She arrives by EMS from [place] care unit for evaluation of left knee pain.  The patient says that she thinks that she fell recently and is having some pain in her knee but admits that she cannot remember exactly what happened.  She has no pain anywhere else.  The pain can be severe at times when she moves or bears weight on it but is not hurting at all at rest.  She has no swelling of which she is aware.  She denies any other pain including headache, neck pain, chest pain, shortness of breath, abdominal pain, and pain in her arms or her other leg.         Past Medical History:  Diagnosis Date  . Arthritis   . Cancer ([place])    skin  . Colon polyps   . COPD (chronic obstructive pulmonary disease) ([place])   . Diverticulosis   . Frequent falls   . GERD (gastroesophageal reflux disease)    with esophageal stricture requiring dilatation x 2 (12/96 and 10/99)  . Glaucoma   . History of chicken pox   . History of CVA (cerebrovascular accident)   . Hypercholesterolemia   . Hypertension   . Hypothyroidism   . Migraines   . Peripheral neuropathy   . Urinary incontinence     Patient Active Problem List   Diagnosis Date Noted  . Ileus ([place]) 08/27/2016  . Multiple falls 07/22/2016  . Dementia ([place]) 04/22/2016  . Fracture of femoral neck, left ([place]) 04/16/2016  . Fracture, femur, neck, left, closed, initial encounter 04/16/2016  . Vomiting 03/17/2016  . Rash 03/17/2016  . Diarrhea 02/18/2016  . Adult hypothyroidism  01/29/2016  . Fall 01/19/2016  . Anemia 10/20/2015  . Headache, migraine 10/10/2015  . HLD (hyperlipidemia) 10/10/2015  . Glaucoma 10/10/2015  . Chronic obstructive pulmonary disease ([place]) 10/10/2015  . Arthritis 10/10/2015  . Hypertrophic toenail 07/23/2015  . Health care maintenance 05/27/2015  . Difficulty in walking 10/04/2014  . Cerebral ataxia ([place]) 10/04/2014  . Unsteady gait 08/22/2014  . Obesity 08/22/2014  . Dysphagia 07/14/2014  . Amnesia 05/24/2014  . B12 deficiency 05/24/2014  . 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 SOB (shortness of breath) 05/06/2014  . CVA (cerebral vascular accident) ([place]) 03/04/2014  . Diverticulosis 04/29/2013  . GERD (gastroesophageal reflux disease) 11/27/2012  . Urinary incontinence 11/27/2012  . Ovarian cyst 11/27/2012  . Depression 11/27/2012  . Neuropathy 11/27/2012  . Hypothyroidism 11/15/2012  . Hypercholesterolemia 11/15/2012  . Diabetes mellitus ([place]) 11/15/2012  . Hypertension 11/15/2012    Past Surgical History:  Procedure Laterality Date  . ABDOMINAL HYSTERECTOMY  1961   ovaries not removed  . APPENDECTOMY    . bladder tack  1976  . CATARACT EXTRACTION  1997   bilateral  . CHOLECYSTECTOMY    . HIP ARTHROPLASTY Left 04/16/2016   Procedure: ARTHROPLASTY BIPOLAR HIP (HEMIARTHROPLASTY);  Surgeon: Corky Mull, MD;  Location: [facility] ORS;  Service: Orthopedics;  Laterality: Left;  . INTERSTIM IMPLANT PLACEMENT  2011  . TONSILLECTOMY      Prior  to Admission medications   Medication Sig Start Date End Date Taking? Authorizing Provider  acetaminophen (TYLENOL) 325 MG tablet Take 650 mg by mouth 3 (three) times daily as needed for headache (neck pain).    [provider]  albuterol (PROVENTIL HFA;VENTOLIN HFA) 108 (90 BASE) MCG/ACT inhaler Inhale 2 puffs into the lungs every 6 (six) hours as needed for wheezing or shortness of breath. Patient not taking: Reported on 09/21/2018 06/14/15   Rubbie Battiest, RN  aspirin 325 MG  tablet Take 325 mg by mouth daily.    [provider]  atorvastatin (LIPITOR) 40 MG tablet Take 40 mg by mouth at bedtime.    [provider]  bimatoprost (LUMIGAN) 0.01 % SOLN Place 1 drop into both eyes at bedtime.    [provider]  Calcium Carbonate (CALCIUM OYSTER SHELL) 1250 (500 Ca) MG TABS Take 500 mg by mouth daily.    [provider]  Coenzyme Q10 (CO Q10) 100 MG CAPS Take 100 mg by mouth daily.     [provider]  docusate sodium (COLACE) 100 MG capsule Take 1 capsule (100 mg total) by mouth 2 (two) times daily. Patient not taking: Reported on 09/21/2018 04/17/16   Hillary Bow, MD  feeding supplement, ENSURE ENLIVE, (ENSURE ENLIVE) LIQD Take 237 mLs by mouth 2 (two) times daily between meals. Patient taking differently: Take 1 Bottle by mouth daily.  04/23/16   Gladstone Lighter, MD  fluticasone (FLONASE) 50 MCG/ACT nasal spray Place 2 sprays into both nostrils daily.    [provider]  guaifenesin (ROBITUSSIN) 100 MG/5ML syrup Take 400 mg by mouth 3 (three) times daily as needed for cough.    [provider]  lactase (LACTAID) 3000 units tablet Take 3,000 Units by mouth 3 (three) times daily as needed (dairy constipation).    [provider]  levothyroxine (SYNTHROID) 112 MCG tablet Take 100 mcg by mouth daily before breakfast.     [provider]  loperamide (IMODIUM A-D) 2 MG tablet Take 2 mg by mouth as needed for diarrhea or loose stools (Up to 3 doses in 12 hours).    [provider]  LORazepam (ATIVAN) 0.5 MG tablet Take 0.5 mg by mouth 3 (three) times daily as needed. 11/02/19   [provider]  memantine (NAMENDA) 10 MG tablet Take 10 mg by mouth 2 (two) times daily.    [provider]  metoprolol tartrate (LOPRESSOR) 25 MG tablet Take 1 tablet (25 mg total) by mouth 2 (two) times daily. Patient taking differently: Take 12.5 mg by mouth 2 (two) times daily.  07/24/16    Loletha Grayer, MD  Multiple Vitamin (TAB-A-VITE) TABS Take 1 tablet by mouth daily.    [provider]  pantoprazole (PROTONIX) 40 MG tablet Take 1 tablet (40 mg total) by mouth daily. 03/09/16   Lucilla Lame, MD  sertraline (ZOLOFT) 100 MG tablet Take 200 mg by mouth daily.     [provider]  vitamin B-12 (CYANOCOBALAMIN) 1000 MCG tablet Take 1,000 mcg by mouth 2 (two) times a week.     [provider]    Allergies Tramadol  Family History  Problem Relation Age of Onset  . Heart disease Father        myocardial infarction  . Arthritis/Rheumatoid Mother   . Diabetes Sister   . Epilepsy Sister   . Breast cancer Neg Hx   . Colon cancer Neg Hx     Social History Social History  Tobacco Use  . Smoking status: Never Smoker  . Smokeless tobacco: Never Used  Substance Use Topics  . Alcohol use: No    Alcohol/week: 0.0 standard drinks  . Drug use: No    Review of Systems Level 5 caveat:  history/ROS limited by chronic dementia   Constitutional: No fever/chills Cardiovascular: Denies chest pain. Respiratory: Denies shortness of breath. Gastrointestinal: No abdominal pain.  No nausea, no vomiting.   Musculoskeletal: Left knee pain, no other musculoskeletal complaints.  No head or neck pain. Integumentary: Negative for laceration. Neurological: Negative for headaches, focal weakness or numbness.   ____________________________________________   PHYSICAL EXAM:  VITAL SIGNS: ED Triage Vitals  Enc Vitals Group     BP 08/10/20 1913 140/66     Pulse Rate 08/10/20 1913 85     Resp 08/10/20 1913 18     Temp 08/10/20 1913 98.4 F (36.9 C)     Temp Source 08/10/20 1913 Oral     SpO2 08/10/20 1913 98 %     Weight 08/10/20 1914 74 kg (163 lb 2.3 oz)     Height 08/10/20 1914 1.6 m (5\' 3" )     Head Circumference --      Peak Flow --      Pain Score --      Pain Loc --      Pain Edu? --      Excl. in [place]? --     Constitutional: Alert and to  self and location. Eyes: Conjunctivae are normal.  Head: Atraumatic. Nose: No congestion/rhinnorhea. Neck: No stridor.  No meningeal signs.  No tenderness to palpation of the cervical spine. Cardiovascular: Normal rate, regular rhythm. Good peripheral circulation.  Respiratory: Normal respiratory effort.  No retractions. Musculoskeletal: Minimal left knee pain/tenderness with passive flexion and extension of the left knee.  No obvious swelling or effusion.  She has some mild tenderness to palpation most notable on the medial infrapatellar region but without any gross deformity. Neurologic:  Normal speech and language. No gross focal neurologic deficits are appreciated.  Skin:  Skin is warm, dry and intact. Psychiatric: Mood and affect are normal. Speech and behavior are normal.  ____________________________________________   LABS (all labs ordered are listed, but only abnormal results are displayed)  Labs Reviewed - No data to display ____________________________________________  EKG  No indication for emergent EKG ____________________________________________  RADIOLOGY I, Hinda Kehr, personally viewed and evaluated these images (plain radiographs) as part of my medical decision making, as well as reviewing the written report by the radiologist.  ED MD interpretation: Small suprapatellar joint effusion, no fracture nor dislocation.  Official radiology report(s): DG Knee Complete 4 Views Left  Result Date: 08/10/2020 CLINICAL DATA:  Knee pain without trauma. EXAM: LEFT KNEE - COMPLETE 4+ VIEW COMPARISON:  None. FINDINGS: Mild joint space narrowing and subchondral sclerosis involving all 3 compartments of the knee. Small suprapatellar joint effusion. No acute fracture or dislocation. IMPRESSION: Mild osteoarthritis with small suprapatellar joint effusion. Electronically Signed   By: Abigail Miyamoto M.D.   On: 08/10/2020 20:05    ____________________________________________    PROCEDURES   Procedure(s) performed (including Critical Care):  Procedures   ____________________________________________   INITIAL IMPRESSION / MDM / [place] / ED COURSE  As part of my medical decision making, I reviewed the following data within the [place] notes reviewed and incorporated, Old chart reviewed, Radiograph reviewed  and Notes from prior ED visits   No evidence of acute injury  or emergent medical condition.  No evidence of fracture or dislocation on radiographs which I personally reviewed.  No evidence of septic joint, gout, or other condition that would require arthrocentesis.  Let the patient know that she likely has a contusion to her knee but that should improve soon.  We will give her an Ace wrap for comfort and encourage close outpatient follow-up.  She understands what I am telling her at the time and I will provide written follow-up recommendations in her discharge paperwork.         ____________________________________________  FINAL CLINICAL IMPRESSION(S) / ED DIAGNOSES  Final diagnoses:  Acute pain of left knee  Contusion of left knee, initial encounter  Primary osteoarthritis of left knee     MEDICATIONS GIVEN DURING THIS VISIT:  Medications  ibuprofen (ADVIL) tablet 400 mg (400 mg Oral Given 08/10/20 2330)     ED Discharge Orders    None      *Please note:  Sandra Kaufman was evaluated in Emergency Department on 08/11/2020 for the symptoms described in the history of present illness. She was evaluated in the context of the global COVID-19 pandemic, which necessitated consideration that the patient might be at risk for infection with the SARS-CoV-2 virus that causes COVID-19. Institutional protocols and algorithms that pertain to the evaluation of patients at risk for COVID-19 are in a state of rapid change based on information released by regulatory bodies including the CDC and federal and state  organizations. These policies and algorithms were followed during the patient's care in the ED.  Some ED evaluations and interventions may be delayed as a result of limited staffing during and after the pandemic.*  Note:  This document was prepared using Dragon voice recognition software and may include unintentional dictation errors.   Hinda Kehr, MD 08/11/20 [phone]

## 2020-08-11 NOTE — Discharge Instructions (Signed)
Sandra Kaufman has normal x-rays other than her chronic osteoarthritis.  She likely sustained a contusion to her knee during a fall but there is no evidence of fracture or dislocation.  We have provided an Ace wrap for comfort and she can bear weight as tolerated although she may have more comfort and success by trying to avoid using the knee for a period of time until the pain has improved.  She can use over-the-counter ibuprofen and/or Tylenol as needed for pain and I encourage her to follow-up with her primary care doctor at the next available opportunity for reevaluation and assessment.

## 2020-08-11 NOTE — ED Notes (Signed)
Called report to [facility] at [phone]

## 2020-08-11 NOTE — ED Notes (Signed)
Pt tolerated procedure well, pt awake talking, pt rates her pain 4/10

## 2020-11-11 ENCOUNTER — Emergency Department
Admission: EM | Admit: 2020-11-11 | Discharge: 2020-11-11 | Disposition: A | Discharge status: Home / Self Care | Payer: Medicare Other | Attending: Emergency Medicine | Admitting: Emergency Medicine

## 2020-11-11 ENCOUNTER — Emergency Department: Payer: Medicare Other

## 2020-11-11 DIAGNOSIS — F0390 Unspecified dementia without behavioral disturbance: Secondary | ICD-10-CM | POA: Diagnosis not present

## 2020-11-11 DIAGNOSIS — I10 Essential (primary) hypertension: Secondary | ICD-10-CM | POA: Insufficient documentation

## 2020-11-11 DIAGNOSIS — E119 Type 2 diabetes mellitus without complications: Secondary | ICD-10-CM | POA: Insufficient documentation

## 2020-11-11 DIAGNOSIS — Z96643 Presence of artificial hip joint, bilateral: Secondary | ICD-10-CM | POA: Diagnosis not present

## 2020-11-11 DIAGNOSIS — J449 Chronic obstructive pulmonary disease, unspecified: Secondary | ICD-10-CM | POA: Diagnosis not present

## 2020-11-11 DIAGNOSIS — W19XXXA Unspecified fall, initial encounter: Secondary | ICD-10-CM | POA: Diagnosis not present

## 2020-11-11 DIAGNOSIS — Z79899 Other long term (current) drug therapy: Secondary | ICD-10-CM | POA: Diagnosis not present

## 2020-11-11 DIAGNOSIS — R079 Chest pain, unspecified: Secondary | ICD-10-CM | POA: Diagnosis present

## 2020-11-11 DIAGNOSIS — E039 Hypothyroidism, unspecified: Secondary | ICD-10-CM | POA: Diagnosis not present

## 2020-11-11 DIAGNOSIS — R0781 Pleurodynia: Secondary | ICD-10-CM

## 2020-11-11 DIAGNOSIS — Z85828 Personal history of other malignant neoplasm of skin: Secondary | ICD-10-CM | POA: Insufficient documentation

## 2020-11-11 DIAGNOSIS — Z7982 Long term (current) use of aspirin: Secondary | ICD-10-CM | POA: Insufficient documentation

## 2020-11-11 LAB — CBC WITH DIFFERENTIAL/PLATELET
Abs Immature Granulocytes: 0.02 10*3/uL (ref 0.00–0.07)
Basophils Absolute: 0.1 10*3/uL (ref 0.0–0.1)
Basophils Relative: 1 %
Eosinophils Absolute: 0.1 10*3/uL (ref 0.0–0.5)
Eosinophils Relative: 1 %
HCT: 35.9 % — ABNORMAL LOW (ref 36.0–46.0)
Hemoglobin: 11.3 g/dL — ABNORMAL LOW (ref 12.0–15.0)
Immature Granulocytes: 0 %
Lymphocytes Relative: 12 %
Lymphs Abs: 1.1 10*3/uL (ref 0.7–4.0)
MCH: 25.9 pg — ABNORMAL LOW (ref 26.0–34.0)
MCHC: 31.5 g/dL (ref 30.0–36.0)
MCV: 82.3 fL (ref 80.0–100.0)
Monocytes Absolute: 0.7 10*3/uL (ref 0.1–1.0)
Monocytes Relative: 8 %
Neutro Abs: 6.8 10*3/uL (ref 1.7–7.7)
Neutrophils Relative %: 78 %
Platelets: 196 10*3/uL (ref 150–400)
RBC: 4.36 MIL/uL (ref 3.87–5.11)
RDW: 15.9 % — ABNORMAL HIGH (ref 11.5–15.5)
WBC: 8.7 10*3/uL (ref 4.0–10.5)
nRBC: 0 % (ref 0.0–0.2)

## 2020-11-11 LAB — COMPREHENSIVE METABOLIC PANEL
ALT: 17 U/L (ref 0–44)
AST: 22 U/L (ref 15–41)
Albumin: 4 g/dL (ref 3.5–5.0)
Alkaline Phosphatase: 62 U/L (ref 38–126)
Anion gap: 11 (ref 5–15)
BUN: 16 mg/dL (ref 8–23)
CO2: 24 mmol/L (ref 22–32)
Calcium: 8.7 mg/dL — ABNORMAL LOW (ref 8.9–10.3)
Chloride: 103 mmol/L (ref 98–111)
Creatinine, Ser: 0.81 mg/dL (ref 0.44–1.00)
GFR, Estimated: >60 mL/min (ref >60)
Glucose, Bld: 100 mg/dL — ABNORMAL HIGH (ref 70–99)
Potassium: 4.2 mmol/L (ref 3.5–5.1)
Sodium: 138 mmol/L (ref 135–145)
Total Bilirubin: 0.7 mg/dL (ref 0.3–1.2)
Total Protein: 7.3 g/dL (ref 6.5–8.1)

## 2020-11-11 LAB — TROPONIN I (HIGH SENSITIVITY): Troponin I (High Sensitivity): 5 ng/L (ref <18)

## 2020-11-11 MED ORDER — ONDANSETRON HCL 4 MG/2ML IJ SOLN
4.0000 mg | Freq: Once | INTRAMUSCULAR | Status: AC
  Administered 2020-11-11: 4 mg via INTRAVENOUS
  Filled 2020-11-11: qty 2

## 2020-11-11 MED ORDER — MORPHINE SULFATE (PF) 2 MG/ML IV SOLN
2.0000 mg | Freq: Once | INTRAVENOUS | Status: AC
  Administered 2020-11-11: 2 mg via INTRAVENOUS
  Filled 2020-11-11: qty 1

## 2020-11-11 MED ORDER — ACETAMINOPHEN 500 MG PO TABS
1000.0000 mg | ORAL_TABLET | Freq: Once | ORAL | Status: AC
  Administered 2020-11-11: 1000 mg via ORAL
  Filled 2020-11-11: qty 2

## 2020-11-11 NOTE — ED Notes (Signed)
[facility] called for transport. 

## 2020-11-11 NOTE — ED Notes (Signed)
Patient transported to X-ray 

## 2020-11-11 NOTE — ED Notes (Signed)
Pt to ED for c/o Lt rib cage pain. Pt denies fall or injury. Pt yells out in pain with movement of upper extremities. No bruises noted on assessment.

## 2020-11-11 NOTE — ED Notes (Signed)
Pt refusing labs at this time. States "Get my doctor in here and have him tell me I need it". Attempted to reorient pt to need for checking heart, pt continues to decline. States "I think I am being screwed here, I don't want to pay for unnecessary things". States "If my doctor says he wants it, then I will". ER MD aware at this time. Pt currently in agreement to have Xrays.

## 2020-11-11 NOTE — Discharge Instructions (Addendum)
Ms. Marshman imaging and lab work today was negative for any significant abnormalities.  For her rib pain, she can take Tylenol 650 mg every 4-6 hours, and I have prescribed Lidocaine patches to apply to the area of pain.

## 2020-11-11 NOTE — ED Provider Notes (Signed)
[facility] Emergency Department Provider Note  ____________________________________________   First MD Initiated Contact with Patient 11/11/20 1809     (approximate)  I have reviewed the triage vital signs and the nursing notes.   HISTORY  Chief Complaint No chief complaint on file.    HPI Sandra Kaufman is a 86 y.o. female with history of dementia, in the memory unit, hypertension, hyperlipidemia, COPD, here with left-sided chest pain.  History is severely limited due to the patient's dementia and lack of remembering what happened.  However, she states that she hurts in her left side.  She has history of frequent falls and believes she may have fallen.  The pain is sharp, localized in the left flank, and worse with inspiration and palpation.  No alleviating factors.  Facility did not see any falls, patient has fallen before and got himself back up.  She has been seen here multiple times for falls.  She is not on blood thinners.  Denies any headache.  No other complaints.  No signs of head trauma.  Level 5 caveat invoked as remainder of history, ROS, and physical exam limited due to patient's dementia.   Past Medical History:  Diagnosis Date  . Arthritis   . Cancer ([place])    skin  . Colon polyps   . COPD (chronic obstructive pulmonary disease) ([place])   . Diverticulosis   . Frequent falls   . GERD (gastroesophageal reflux disease)    with esophageal stricture requiring dilatation x 2 (12/96 and 10/99)  . Glaucoma   . History of chicken pox   . History of CVA (cerebrovascular accident)   . Hypercholesterolemia   . Hypertension   . Hypothyroidism   . Migraines   . Peripheral neuropathy   . Urinary incontinence     Patient Active Problem List   Diagnosis Date Noted  . Ileus ([place]) 08/27/2016  . Multiple falls 07/22/2016  . Dementia ([place]) 04/22/2016  . Fracture of femoral neck, left ([place]) 04/16/2016  . Fracture, femur, neck, left, closed, initial  encounter 04/16/2016  . Vomiting 03/17/2016  . Rash 03/17/2016  . Diarrhea 02/18/2016  . Adult hypothyroidism 01/29/2016  . Fall 01/19/2016  . Anemia 10/20/2015  . Headache, migraine 10/10/2015  . HLD (hyperlipidemia) 10/10/2015  . Glaucoma 10/10/2015  . Chronic obstructive pulmonary disease ([place]) 10/10/2015  . Arthritis 10/10/2015  . Hypertrophic toenail 07/23/2015  . Health care maintenance 05/27/2015  . Difficulty in walking 10/04/2014  . Cerebral ataxia ([place]) 10/04/2014  . Unsteady gait 08/22/2014  . Obesity 08/22/2014  . Dysphagia 07/14/2014  . Amnesia 05/24/2014  . B12 deficiency 05/24/2014  . 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 SOB (shortness of breath) 05/06/2014  . CVA (cerebral vascular accident) ([place]) 03/04/2014  . Diverticulosis 04/29/2013  . GERD (gastroesophageal reflux disease) 11/27/2012  . Urinary incontinence 11/27/2012  . Ovarian cyst 11/27/2012  . Depression 11/27/2012  . Neuropathy 11/27/2012  . Hypothyroidism 11/15/2012  . Hypercholesterolemia 11/15/2012  . Diabetes mellitus ([place]) 11/15/2012  . Hypertension 11/15/2012    Past Surgical History:  Procedure Laterality Date  . ABDOMINAL HYSTERECTOMY  1961   ovaries not removed  . APPENDECTOMY    . bladder tack  1976  . CATARACT EXTRACTION  1997   bilateral  . CHOLECYSTECTOMY    . HIP ARTHROPLASTY Left 04/16/2016   Procedure: ARTHROPLASTY BIPOLAR HIP (HEMIARTHROPLASTY);  Surgeon: Corky Mull, MD;  Location: [facility] ORS;  Service: Orthopedics;  Laterality: Left;  . INTERSTIM IMPLANT PLACEMENT  2011  . TONSILLECTOMY      Prior to Admission medications   Medication Sig Start Date End Date Taking? Authorizing Provider  acetaminophen (TYLENOL) 325 MG tablet Take 650 mg by mouth 3 (three) times daily as needed for headache (neck pain).    [provider]  albuterol (PROVENTIL HFA;VENTOLIN HFA) 108 (90 BASE) MCG/ACT inhaler Inhale 2 puffs into the lungs every 6 (six) hours as needed for wheezing or  shortness of breath. Patient not taking: Reported on 09/21/2018 06/14/15   Rubbie Battiest, RN  aspirin 325 MG tablet Take 325 mg by mouth daily.    [provider]  atorvastatin (LIPITOR) 40 MG tablet Take 40 mg by mouth at bedtime.    [provider]  bimatoprost (LUMIGAN) 0.01 % SOLN Place 1 drop into both eyes at bedtime.    [provider]  Calcium Carbonate (CALCIUM OYSTER SHELL) 1250 (500 Ca) MG TABS Take 500 mg by mouth daily.    [provider]  Coenzyme Q10 (CO Q10) 100 MG CAPS Take 100 mg by mouth daily.     [provider]  docusate sodium (COLACE) 100 MG capsule Take 1 capsule (100 mg total) by mouth 2 (two) times daily. Patient not taking: Reported on 09/21/2018 04/17/16   Hillary Bow, MD  feeding supplement, ENSURE ENLIVE, (ENSURE ENLIVE) LIQD Take 237 mLs by mouth 2 (two) times daily between meals. Patient taking differently: Take 1 Bottle by mouth daily.  04/23/16   Gladstone Lighter, MD  fluticasone (FLONASE) 50 MCG/ACT nasal spray Place 2 sprays into both nostrils daily.    [provider]  guaifenesin (ROBITUSSIN) 100 MG/5ML syrup Take 400 mg by mouth 3 (three) times daily as needed for cough.    [provider]  lactase (LACTAID) 3000 units tablet Take 3,000 Units by mouth 3 (three) times daily as needed (dairy constipation).    [provider]  levothyroxine (SYNTHROID) 112 MCG tablet Take 100 mcg by mouth daily before breakfast.     [provider]  loperamide (IMODIUM A-D) 2 MG tablet Take 2 mg by mouth as needed for diarrhea or loose stools (Up to 3 doses in 12 hours).    [provider]  LORazepam (ATIVAN) 0.5 MG tablet Take 0.5 mg by mouth 3 (three) times daily as needed. 11/02/19   [provider]  memantine (NAMENDA) 10 MG tablet Take 10 mg by mouth 2 (two) times daily.    [provider]  metoprolol tartrate (LOPRESSOR) 25 MG tablet Take 1 tablet (25 mg total) by  mouth 2 (two) times daily. Patient taking differently: Take 12.5 mg by mouth 2 (two) times daily.  07/24/16   Loletha Grayer, MD  Multiple Vitamin (TAB-A-VITE) TABS Take 1 tablet by mouth daily.    [provider]  pantoprazole (PROTONIX) 40 MG tablet Take 1 tablet (40 mg total) by mouth daily. 03/09/16   Lucilla Lame, MD  sertraline (ZOLOFT) 100 MG tablet Take 200 mg by mouth daily.     [provider]  vitamin B-12 (CYANOCOBALAMIN) 1000 MCG tablet Take 1,000 mcg by mouth 2 (two) times a week.     [provider]    Allergies Tramadol  Family History  Problem Relation Age of Onset  . Heart disease Father        myocardial infarction  . Arthritis/Rheumatoid Mother   . Diabetes Sister   . Epilepsy Sister   . Breast cancer Neg Hx   . Colon cancer  Neg Hx     Social History Social History   Tobacco Use  . Smoking status: Never Smoker  . Smokeless tobacco: Never Used  Substance Use Topics  . Alcohol use: No    Alcohol/week: 0.0 standard drinks  . Drug use: No    Review of Systems  Review of Systems  Unable to perform ROS: Mental status change     ____________________________________________  PHYSICAL EXAM:      VITAL SIGNS: ED Triage Vitals  Enc Vitals Group     BP 11/11/20 1814 131/77     Pulse Rate 11/11/20 1814 71     Resp 11/11/20 1814 18     Temp 11/11/20 1814 98.8 F (37.1 C)     Temp Source 11/11/20 1814 Oral     SpO2 11/11/20 1814 99 %     Weight 11/11/20 1816 150 lb (68 kg)     Height 11/11/20 1816 5\' 5"  (1.651 m)     Head Circumference --      Peak Flow --      Pain Score --      Pain Loc --      Pain Edu? --      Excl. in [place]? --      Physical Exam Vitals and nursing note reviewed.  Constitutional:      General: She is not in acute distress.    Appearance: She is well-developed.  HENT:     Head: Normocephalic and atraumatic.  Eyes:     Conjunctiva/sclera: Conjunctivae normal.  Cardiovascular:     Rate and  Rhythm: Normal rate and regular rhythm.     Heart sounds: Normal heart sounds.  Pulmonary:     Effort: Pulmonary effort is normal. No respiratory distress.     Breath sounds: No wheezing.  Abdominal:     General: There is no distension.  Musculoskeletal:     Cervical back: Neck supple.     Comments: TTP over left lateral chest wall. No bruising or deformity. Moderate TTP over left shoulder/proximal humrus.  Skin:    General: Skin is warm.     Capillary Refill: Capillary refill takes less than 2 seconds.     Findings: No rash.  Neurological:     Mental Status: She is alert and oriented to person, place, and time.     Motor: No abnormal muscle tone.       ____________________________________________   LABS (all labs ordered are listed, but only abnormal results are displayed)  Labs Reviewed  CBC WITH DIFFERENTIAL/PLATELET - Abnormal; Notable for the following components:      Result Value   Hemoglobin 11.3 (*)    HCT 35.9 (*)    MCH 25.9 (*)    RDW 15.9 (*)    All other components within normal limits  COMPREHENSIVE METABOLIC PANEL - Abnormal; Notable for the following components:   Glucose, Bld 100 (*)    Calcium 8.7 (*)    All other components within normal limits  TROPONIN I (HIGH SENSITIVITY)  TROPONIN I (HIGH SENSITIVITY)    ____________________________________________  EKG: Normal sinus rhythm, VR 77. QRS 159, QTc 468. RBBB. No acute St elevations or depressions. ________________________________________  RADIOLOGY All imaging, including plain films, CT scans, and ultrasounds, independently reviewed by me, and interpretations confirmed via formal radiology reads.  ED MD interpretation:   CXR: clear, no active disease XR Humerus: negative   Official radiology report(s): DG Chest 2 View  Result Date: 11/11/2020 CLINICAL DATA:  Left-sided chest  pain EXAM: CHEST - 2 VIEW COMPARISON:  None. FINDINGS: The heart size and mediastinal contours are within normal  limits. Aortic knob calcifications are seen. Both lungs are clear. The visualized skeletal structures are unremarkable. IMPRESSION: No active cardiopulmonary disease. Electronically Signed   By: Prudencio Pair M.D.   On: 11/11/2020 19:34   DG Shoulder Left  Result Date: 11/11/2020 CLINICAL DATA:  Left-sided chest pain. Pain with movement of upper extremities. EXAM: LEFT SHOULDER - 2+ VIEW COMPARISON:  None. FINDINGS: The bones appear diffusely osteopenic. No underlying fracture or dislocation identified. No radiopaque foreign bodies are soft tissue calcifications. IMPRESSION: 1. No acute findings. 2. Osteopenia. Electronically Signed   By: Kerby Moors M.D.   On: 11/11/2020 19:33    ____________________________________________  PROCEDURES   Procedure(s) performed (including Critical Care):  Procedures  ____________________________________________  INITIAL IMPRESSION / MDM / [place] / ED COURSE  As part of my medical decision making, I reviewed the following data within the [facility] notes reviewed and incorporated, Old chart reviewed, Notes from prior ED visits, and [place] Controlled Substance Database       *Sandra Kaufman was evaluated in Emergency Department on 11/11/2020 for the symptoms described in the history of present illness. She was evaluated in the context of the global COVID-19 pandemic, which necessitated consideration that the patient might be at risk for infection with the SARS-CoV-2 virus that causes COVID-19. Institutional protocols and algorithms that pertain to the evaluation of patients at risk for COVID-19 are in a state of rapid change based on information released by regulatory bodies including the CDC and federal and state organizations. These policies and algorithms were followed during the patient's care in the ED.  Some ED evaluations and interventions may be delayed as a result of limited staffing during the pandemic.*      Medical Decision Making:  86 yo F here with left rib/upper arm pain. Suspect possible fall given her history with MSK pain. CXR, XR Humerus obtained, reviewed by me and are negative. Pt is HDS. No head injury, headache, neck pain, or signs of cranial or neck trauma. EKG nonischemic, doubt ACS. Labs sent 2/2 unclear history - CBC shows normal WBC and Hgb. CMP without abnormality. Trop neg. Will d/c with tylenol PRN for likely MSK pain. No apparent emergent medical condition. Pt monitored on tele here w/o ectopy or arrhythmia - remains in NSR.  ____________________________________________  FINAL CLINICAL IMPRESSION(S) / ED DIAGNOSES  Final diagnoses:  Fall, initial encounter  Rib pain on left side     MEDICATIONS GIVEN DURING THIS VISIT:  Medications  acetaminophen (TYLENOL) tablet 1,000 mg (1,000 mg Oral Given 11/11/20 1926)  morphine 2 MG/ML injection 2 mg (2 mg Intravenous Given 11/11/20 2014)  ondansetron (ZOFRAN) injection 4 mg (4 mg Intravenous Given 11/11/20 2014)     ED Discharge Orders    None       Note:  This document was prepared using Dragon voice recognition software and may include unintentional dictation errors.   Duffy Bruce, MD 11/11/20 2113

## 2020-12-12 ENCOUNTER — Emergency Department: Payer: Medicare Other

## 2020-12-12 ENCOUNTER — Encounter: Payer: Self-pay | Admitting: *Deleted

## 2020-12-12 ENCOUNTER — Other Ambulatory Visit: Payer: Self-pay

## 2020-12-12 ENCOUNTER — Emergency Department
Admission: EM | Admit: 2020-12-12 | Discharge: 2020-12-13 | Disposition: A | Discharge status: Home / Self Care | Payer: Medicare Other | Attending: Emergency Medicine | Admitting: Emergency Medicine

## 2020-12-12 DIAGNOSIS — E039 Hypothyroidism, unspecified: Secondary | ICD-10-CM | POA: Diagnosis not present

## 2020-12-12 DIAGNOSIS — Z7982 Long term (current) use of aspirin: Secondary | ICD-10-CM | POA: Diagnosis not present

## 2020-12-12 DIAGNOSIS — S0990XA Unspecified injury of head, initial encounter: Secondary | ICD-10-CM

## 2020-12-12 DIAGNOSIS — E119 Type 2 diabetes mellitus without complications: Secondary | ICD-10-CM | POA: Diagnosis not present

## 2020-12-12 DIAGNOSIS — I10 Essential (primary) hypertension: Secondary | ICD-10-CM | POA: Diagnosis not present

## 2020-12-12 DIAGNOSIS — J449 Chronic obstructive pulmonary disease, unspecified: Secondary | ICD-10-CM | POA: Diagnosis not present

## 2020-12-12 DIAGNOSIS — Z85828 Personal history of other malignant neoplasm of skin: Secondary | ICD-10-CM | POA: Diagnosis not present

## 2020-12-12 DIAGNOSIS — F0390 Unspecified dementia without behavioral disturbance: Secondary | ICD-10-CM | POA: Diagnosis not present

## 2020-12-12 DIAGNOSIS — N390 Urinary tract infection, site not specified: Secondary | ICD-10-CM | POA: Insufficient documentation

## 2020-12-12 DIAGNOSIS — Z79899 Other long term (current) drug therapy: Secondary | ICD-10-CM | POA: Insufficient documentation

## 2020-12-12 DIAGNOSIS — W01198A Fall on same level from slipping, tripping and stumbling with subsequent striking against other object, initial encounter: Secondary | ICD-10-CM | POA: Insufficient documentation

## 2020-12-12 LAB — CBC WITH DIFFERENTIAL/PLATELET
Abs Immature Granulocytes: 0.03 10*3/uL (ref 0.00–0.07)
Basophils Absolute: 0.1 10*3/uL (ref 0.0–0.1)
Basophils Relative: 1 %
Eosinophils Absolute: 0.1 10*3/uL (ref 0.0–0.5)
Eosinophils Relative: 2 %
HCT: 35.8 % — ABNORMAL LOW (ref 36.0–46.0)
Hemoglobin: 11.1 g/dL — ABNORMAL LOW (ref 12.0–15.0)
Immature Granulocytes: 0 %
Lymphocytes Relative: 13 %
Lymphs Abs: 1.1 10*3/uL (ref 0.7–4.0)
MCH: 26.4 pg (ref 26.0–34.0)
MCHC: 31 g/dL (ref 30.0–36.0)
MCV: 85 fL (ref 80.0–100.0)
Monocytes Absolute: 0.7 10*3/uL (ref 0.1–1.0)
Monocytes Relative: 8 %
Neutro Abs: 6.3 10*3/uL (ref 1.7–7.7)
Neutrophils Relative %: 76 %
Platelets: 199 10*3/uL (ref 150–400)
RBC: 4.21 MIL/uL (ref 3.87–5.11)
RDW: 16.3 % — ABNORMAL HIGH (ref 11.5–15.5)
WBC: 8.3 10*3/uL (ref 4.0–10.5)
nRBC: 0 % (ref 0.0–0.2)

## 2020-12-12 LAB — URINALYSIS, COMPLETE (UACMP) WITH MICROSCOPIC
Bilirubin Urine: NEGATIVE
Glucose, UA: NEGATIVE mg/dL
Hgb urine dipstick: NEGATIVE
Ketones, ur: NEGATIVE mg/dL
Nitrite: NEGATIVE
Protein, ur: NEGATIVE mg/dL
Specific Gravity, Urine: 1.027 (ref 1.005–1.030)
Squamous Epithelial / HPF: NONE SEEN (ref 0–5)
WBC, UA: >50 WBC/hpf — ABNORMAL HIGH (ref 0–5)
pH: 5 (ref 5.0–8.0)

## 2020-12-12 LAB — BASIC METABOLIC PANEL
Anion gap: 12 (ref 5–15)
BUN: 22 mg/dL (ref 8–23)
CO2: 22 mmol/L (ref 22–32)
Calcium: 9.1 mg/dL (ref 8.9–10.3)
Chloride: 104 mmol/L (ref 98–111)
Creatinine, Ser: 1.06 mg/dL — ABNORMAL HIGH (ref 0.44–1.00)
GFR, Estimated: 51 mL/min — ABNORMAL LOW (ref >60)
Glucose, Bld: 141 mg/dL — ABNORMAL HIGH (ref 70–99)
Potassium: 3.9 mmol/L (ref 3.5–5.1)
Sodium: 138 mmol/L (ref 135–145)

## 2020-12-12 MED ORDER — CEPHALEXIN 500 MG PO CAPS
500.0000 mg | ORAL_CAPSULE | Freq: Once | ORAL | Status: AC
  Administered 2020-12-12: 500 mg via ORAL
  Filled 2020-12-12: qty 1

## 2020-12-12 MED ORDER — CEPHALEXIN 500 MG PO CAPS: 500.0000 mg | ORAL_CAPSULE | Freq: Two times a day (BID) | ORAL | 0 refills | Status: AC

## 2020-12-12 NOTE — ED Provider Notes (Signed)
[facility] Emergency Department Provider Note ____________________________________________   Event Date/Time   First MD Initiated Contact with Patient 12/12/20 2109     (approximate)  I have reviewed the triage vital signs and the nursing notes.   HISTORY  Chief Complaint Fall  Level 5 caveat: History of present illness limited due to dementia  HPI Sandra Kaufman is a 86 y.o. female with PMH as noted below including history of dementia who presents from her facility after an apparent fall from standing height.  The patient states that she was getting ready for bed and fell to her knees, then hit the back of her head.  She does not know what caused her to fall but does not believe she lost consciousness.  She states she feels fine now other than a knot to the back of her head.  Past Medical History:  Diagnosis Date  . Arthritis   . Cancer ([place])    skin  . Colon polyps   . COPD (chronic obstructive pulmonary disease) ([place])   . Diverticulosis   . Frequent falls   . GERD (gastroesophageal reflux disease)    with esophageal stricture requiring dilatation x 2 (12/96 and 10/99)  . Glaucoma   . History of chicken pox   . History of CVA (cerebrovascular accident)   . Hypercholesterolemia   . Hypertension   . Hypothyroidism   . Migraines   . Peripheral neuropathy   . Urinary incontinence     Patient Active Problem List   Diagnosis Date Noted  . Ileus ([place]) 08/27/2016  . Multiple falls 07/22/2016  . Dementia ([place]) 04/22/2016  . Fracture of femoral neck, left ([place]) 04/16/2016  . Fracture, femur, neck, left, closed, initial encounter 04/16/2016  . Vomiting 03/17/2016  . Rash 03/17/2016  . Diarrhea 02/18/2016  . Adult hypothyroidism 01/29/2016  . Fall 01/19/2016  . Anemia 10/20/2015  . Headache, migraine 10/10/2015  . HLD (hyperlipidemia) 10/10/2015  . Glaucoma 10/10/2015  . Chronic obstructive pulmonary disease ([place]) 10/10/2015  . Arthritis  10/10/2015  . Hypertrophic toenail 07/23/2015  . Health care maintenance 05/27/2015  . Difficulty in walking 10/04/2014  . Cerebral ataxia ([place]) 10/04/2014  . Unsteady gait 08/22/2014  . Obesity 08/22/2014  . Dysphagia 07/14/2014  . Amnesia 05/24/2014  . B12 deficiency 05/24/2014  . 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 SOB (shortness of breath) 05/06/2014  . CVA (cerebral vascular accident) ([place]) 03/04/2014  . Diverticulosis 04/29/2013  . GERD (gastroesophageal reflux disease) 11/27/2012  . Urinary incontinence 11/27/2012  . Ovarian cyst 11/27/2012  . Depression 11/27/2012  . Neuropathy 11/27/2012  . Hypothyroidism 11/15/2012  . Hypercholesterolemia 11/15/2012  . Diabetes mellitus ([place]) 11/15/2012  . Hypertension 11/15/2012    Past Surgical History:  Procedure Laterality Date  . ABDOMINAL HYSTERECTOMY  1961   ovaries not removed  . APPENDECTOMY    . bladder tack  1976  . CATARACT EXTRACTION  1997   bilateral  . CHOLECYSTECTOMY    . HIP ARTHROPLASTY Left 04/16/2016   Procedure: ARTHROPLASTY BIPOLAR HIP (HEMIARTHROPLASTY);  Surgeon: Corky Mull, MD;  Location: [facility] ORS;  Service: Orthopedics;  Laterality: Left;  . INTERSTIM IMPLANT PLACEMENT  2011  . TONSILLECTOMY      Prior to Admission medications   Medication Sig Start Date End Date Taking? Authorizing Provider  acetaminophen (TYLENOL) 325 MG tablet Take 650 mg by mouth 3 (three) times daily as needed for headache (neck pain).    [provider]  albuterol (PROVENTIL HFA;VENTOLIN  HFA) 108 (90 BASE) MCG/ACT inhaler Inhale 2 puffs into the lungs every 6 (six) hours as needed for wheezing or shortness of breath. Patient not taking: Reported on 09/21/2018 06/14/15   Rubbie Battiest, RN  aspirin 325 MG tablet Take 325 mg by mouth daily.    [provider]  atorvastatin (LIPITOR) 40 MG tablet Take 40 mg by mouth at bedtime.    [provider]  bimatoprost (LUMIGAN) 0.01 % SOLN Place 1 drop into both eyes  at bedtime.    [provider]  Calcium Carbonate (CALCIUM OYSTER SHELL) 1250 (500 Ca) MG TABS Take 500 mg by mouth daily.    [provider]  cephALEXin (KEFLEX) 500 MG capsule Take 1 capsule (500 mg total) by mouth 2 (two) times daily for 7 days. 12/12/20 12/19/20  Arta Silence, MD  Coenzyme Q10 (CO Q10) 100 MG CAPS Take 100 mg by mouth daily.     [provider]  docusate sodium (COLACE) 100 MG capsule Take 1 capsule (100 mg total) by mouth 2 (two) times daily. Patient not taking: Reported on 09/21/2018 04/17/16   Hillary Bow, MD  feeding supplement, ENSURE ENLIVE, (ENSURE ENLIVE) LIQD Take 237 mLs by mouth 2 (two) times daily between meals. Patient taking differently: Take 1 Bottle by mouth daily.  04/23/16   Gladstone Lighter, MD  fluticasone (FLONASE) 50 MCG/ACT nasal spray Place 2 sprays into both nostrils daily.    [provider]  guaifenesin (ROBITUSSIN) 100 MG/5ML syrup Take 400 mg by mouth 3 (three) times daily as needed for cough.    [provider]  lactase (LACTAID) 3000 units tablet Take 3,000 Units by mouth 3 (three) times daily as needed (dairy constipation).    [provider]  levothyroxine (SYNTHROID) 112 MCG tablet Take 100 mcg by mouth daily before breakfast.     [provider]  loperamide (IMODIUM A-D) 2 MG tablet Take 2 mg by mouth as needed for diarrhea or loose stools (Up to 3 doses in 12 hours).    [provider]  LORazepam (ATIVAN) 0.5 MG tablet Take 0.5 mg by mouth 3 (three) times daily as needed. 11/02/19   [provider]  memantine (NAMENDA) 10 MG tablet Take 10 mg by mouth 2 (two) times daily.    [provider]  metoprolol tartrate (LOPRESSOR) 25 MG tablet Take 1 tablet (25 mg total) by mouth 2 (two) times daily. Patient taking differently: Take 12.5 mg by mouth 2 (two) times daily.  07/24/16   Loletha Grayer, MD  Multiple Vitamin (TAB-A-VITE) TABS Take 1 tablet by  mouth daily.    [provider]  pantoprazole (PROTONIX) 40 MG tablet Take 1 tablet (40 mg total) by mouth daily. 03/09/16   Lucilla Lame, MD  sertraline (ZOLOFT) 100 MG tablet Take 200 mg by mouth daily.     [provider]  vitamin B-12 (CYANOCOBALAMIN) 1000 MCG tablet Take 1,000 mcg by mouth 2 (two) times a week.     [provider]    Allergies Tramadol  Family History  Problem Relation Age of Onset  . Heart disease Father        myocardial infarction  . Arthritis/Rheumatoid Mother   . Diabetes Sister   . Epilepsy Sister   . Breast cancer Neg Hx   . Colon cancer Neg Hx     Social History Social History   Tobacco Use  . Smoking status: Never Smoker  . Smokeless tobacco: Never Used  Substance  Use Topics  . Alcohol use: No    Alcohol/week: 0.0 standard drinks  . Drug use: No    Review of Systems    ____________________________________________   PHYSICAL EXAM:  VITAL SIGNS: ED Triage Vitals  Enc Vitals Group     BP 12/12/20 2114 124/70     Pulse Rate 12/12/20 2114 69     Resp 12/12/20 2114 18     Temp 12/12/20 2114 98.2 F (36.8 C)     Temp Source 12/12/20 2114 Oral     SpO2 12/12/20 2114 98 %     Weight 12/12/20 2115 165 lb (74.8 kg)     Height 12/12/20 2115 5\' 5"  (1.651 m)     Head Circumference --      Peak Flow --      Pain Score --      Pain Loc --      Pain Edu? --      Excl. in [place]? --     Constitutional: Alert, oriented x1.  Comfortable appearing, in no acute distress. Eyes: Conjunctivae are normal.  EOMI.  PERRLA. Head: Approximately 5 cm hematoma to the right parieto-occipital region. Nose: No congestion/rhinnorhea. Mouth/Throat: Mucous membranes are moist.   Neck: Normal range of motion.  No midline cervical spinal tenderness. Cardiovascular: Normal rate, regular rhythm. Grossly normal heart sounds.  Good peripheral circulation. Respiratory: Normal respiratory effort.  No retractions. Lungs  CTAB. Gastrointestinal:  No distention.  Musculoskeletal: No lower extremity edema.  Extremities warm and well perfused.  Full range of motion of bilateral shoulders, elbows, hips, knees.  No midline spinal tenderness. Neurologic:  Normal speech and language.  Motor intact in all extremities.  Normal coordination. Skin:  Skin is warm and dry. No rash noted. Psychiatric: Mood and affect are normal. Speech and behavior are normal.  ____________________________________________   LABS (all labs ordered are listed, but only abnormal results are displayed)  Labs Reviewed  BASIC METABOLIC PANEL - Abnormal; Notable for the following components:      Result Value   Glucose, Bld 141 (*)    Creatinine, Ser 1.06 (*)    GFR, Estimated 51 (*)    All other components within normal limits  CBC WITH DIFFERENTIAL/PLATELET - Abnormal; Notable for the following components:   Hemoglobin 11.1 (*)    HCT 35.8 (*)    RDW 16.3 (*)    All other components within normal limits  URINALYSIS, COMPLETE (UACMP) WITH MICROSCOPIC - Abnormal; Notable for the following components:   Color, Urine YELLOW (*)    APPearance HAZY (*)    Leukocytes,Ua LARGE (*)    WBC, UA >50 (*)    Bacteria, UA MANY (*)    All other components within normal limits  URINE CULTURE   ____________________________________________  EKG  ED ECG REPORT I, Arta Silence, the attending physician, personally viewed and interpreted this ECG.  Date: 12/12/2020 EKG Time: 2147 Rate: 90 Rhythm: normal sinus rhythm QRS Axis: normal Intervals: RBBB ST/T Wave abnormalities: normal Narrative Interpretation: no evidence of acute ischemia; no significant change when compared to EKG of 11/11/2020  ____________________________________________  RADIOLOGY  CT head: No ICH or other acute abnormality CT cervical spine: No acute fracture  ____________________________________________   PROCEDURES  Procedure(s) performed:  No  Procedures  Critical Care performed: No ____________________________________________   INITIAL IMPRESSION / ASSESSMENT AND PLAN / ED COURSE  Pertinent labs & imaging results that were available during my care of the patient were reviewed by me and considered in  my medical decision making (see chart for details).  86 year old female with PMH as noted above including a history of dementia presents after an apparent fall from standing height at her facility this evening.  The patient remembers the fall and does not believe she had LOC, although is not sure what caused her to fall.  She is otherwise limited in her ability to provide history.  On exam, the patient is well-appearing for her age.  The vital signs are normal.  Neurologic exam is nonfocal.  The patient has a hematoma to the back of her head on the right but otherwise no evidence of trauma.    I reviewed the past medical records in Epic; the patient has had multiple prior ED visits including for falls on 7/25 and 8/15.  Overall I suspect most likely mechanical fall, however given the patient's age and inability to give full history, we will obtain EKG, basic labs, and urinalysis to evaluate for potential causes of weakness or near syncope.  Given the patient's age, we will obtain a CT head.  ----------------------------------------- 11:13 PM on 12/12/2020 -----------------------------------------  CT head is negative.  The patient started complaining of some neck pain so I added on a CT of the cervical spine, which is also negative for acute findings.  Lab work-up is unremarkable although the UA is suggestive of UTI.  Given the normal vital signs and overall well appearance of the patient, she is stable for discharge back to her facility.  We will start her on a 1 week course of Keflex.  Return precautions have been provided.  ____________________________________________   FINAL CLINICAL IMPRESSION(S) / ED DIAGNOSES  Final  diagnoses:  Injury of head, initial encounter  Urinary tract infection without hematuria, site unspecified      NEW MEDICATIONS STARTED DURING THIS VISIT:  New Prescriptions   CEPHALEXIN (KEFLEX) 500 MG CAPSULE    Take 1 capsule (500 mg total) by mouth 2 (two) times daily for 7 days.     Note:  This document was prepared using Dragon voice recognition software and may include unintentional dictation errors.   Arta Silence, MD 12/12/20 [phone]

## 2020-12-12 NOTE — Discharge Instructions (Signed)
Sandra Kaufman had a negative CT of her head and cervical spine.  Work-up reveals a urinary tract infection.  We have prescribed a 1 week course of cephalexin.  The first dose was given in ER.  Sandra Kaufman should return to the ER for any new or worsening weakness or recurrent falls, fever, vomiting, or any other new or worsening symptoms that are concerning.

## 2020-12-12 NOTE — ED Notes (Signed)
Report off to shannon rn

## 2020-12-12 NOTE — ED Triage Notes (Signed)
Pt brought in via ems from [facility].  Pt fell and hit the back of her head.  No lac.  No loc  No vomiting  Pt alert.  Hx dementia pt in hallway bed.

## 2020-12-13 NOTE — ED Notes (Signed)
Pt not a&o at baseline. Unable to sign discharge instructions.

## 2020-12-13 NOTE — ED Notes (Signed)
This RN called [facility] and gave discharge instructions. RN stated no further questions at this time. EMS at bedside to transfer pt.

## 2021-02-10 ENCOUNTER — Encounter: Payer: Self-pay | Admitting: *Deleted

## 2021-02-17 ENCOUNTER — Emergency Department: Payer: Medicare Other

## 2021-02-17 ENCOUNTER — Emergency Department
Admission: EM | Admit: 2021-02-17 | Discharge: 2021-02-17 | Disposition: A | Discharge status: Home / Self Care | Payer: Medicare Other | Attending: Emergency Medicine | Admitting: Emergency Medicine

## 2021-02-17 ENCOUNTER — Other Ambulatory Visit: Payer: Self-pay

## 2021-02-17 DIAGNOSIS — J449 Chronic obstructive pulmonary disease, unspecified: Secondary | ICD-10-CM | POA: Insufficient documentation

## 2021-02-17 DIAGNOSIS — E039 Hypothyroidism, unspecified: Secondary | ICD-10-CM | POA: Diagnosis not present

## 2021-02-17 DIAGNOSIS — S0990XA Unspecified injury of head, initial encounter: Secondary | ICD-10-CM | POA: Diagnosis present

## 2021-02-17 DIAGNOSIS — Z96642 Presence of left artificial hip joint: Secondary | ICD-10-CM | POA: Diagnosis not present

## 2021-02-17 DIAGNOSIS — Z7982 Long term (current) use of aspirin: Secondary | ICD-10-CM | POA: Diagnosis not present

## 2021-02-17 DIAGNOSIS — F0390 Unspecified dementia without behavioral disturbance: Secondary | ICD-10-CM | POA: Diagnosis not present

## 2021-02-17 DIAGNOSIS — E119 Type 2 diabetes mellitus without complications: Secondary | ICD-10-CM | POA: Insufficient documentation

## 2021-02-17 DIAGNOSIS — Z85828 Personal history of other malignant neoplasm of skin: Secondary | ICD-10-CM | POA: Diagnosis not present

## 2021-02-17 DIAGNOSIS — Z79899 Other long term (current) drug therapy: Secondary | ICD-10-CM | POA: Insufficient documentation

## 2021-02-17 DIAGNOSIS — W19XXXA Unspecified fall, initial encounter: Secondary | ICD-10-CM

## 2021-02-17 DIAGNOSIS — S0003XA Contusion of scalp, initial encounter: Secondary | ICD-10-CM | POA: Diagnosis not present

## 2021-02-17 DIAGNOSIS — I10 Essential (primary) hypertension: Secondary | ICD-10-CM | POA: Insufficient documentation

## 2021-02-17 DIAGNOSIS — W01198A Fall on same level from slipping, tripping and stumbling with subsequent striking against other object, initial encounter: Secondary | ICD-10-CM | POA: Insufficient documentation

## 2021-02-17 NOTE — ED Notes (Signed)
Patients son updated by MD.
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
## 2021-02-17 NOTE — ED Notes (Signed)
Attempted to call POA, Toyoko Silos (Son), no answer, left voicemail.
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
## 2021-02-17 NOTE — Discharge Instructions (Signed)

## 2021-06-10 DIAGNOSIS — E039 Hypothyroidism, unspecified: Secondary | ICD-10-CM | POA: Diagnosis not present

## 2021-06-10 DIAGNOSIS — Z85828 Personal history of other malignant neoplasm of skin: Secondary | ICD-10-CM | POA: Insufficient documentation

## 2021-06-10 DIAGNOSIS — Z7982 Long term (current) use of aspirin: Secondary | ICD-10-CM | POA: Diagnosis not present

## 2021-06-10 DIAGNOSIS — E119 Type 2 diabetes mellitus without complications: Secondary | ICD-10-CM | POA: Insufficient documentation

## 2021-06-10 DIAGNOSIS — J449 Chronic obstructive pulmonary disease, unspecified: Secondary | ICD-10-CM | POA: Insufficient documentation

## 2021-06-10 DIAGNOSIS — Z7951 Long term (current) use of inhaled steroids: Secondary | ICD-10-CM | POA: Insufficient documentation

## 2021-06-10 DIAGNOSIS — S0181XA Laceration without foreign body of other part of head, initial encounter: Secondary | ICD-10-CM | POA: Diagnosis not present

## 2021-06-10 DIAGNOSIS — W01198A Fall on same level from slipping, tripping and stumbling with subsequent striking against other object, initial encounter: Secondary | ICD-10-CM | POA: Diagnosis not present

## 2021-06-10 DIAGNOSIS — Z79899 Other long term (current) drug therapy: Secondary | ICD-10-CM | POA: Diagnosis not present

## 2021-06-10 DIAGNOSIS — Z96642 Presence of left artificial hip joint: Secondary | ICD-10-CM | POA: Insufficient documentation

## 2021-06-10 DIAGNOSIS — I10 Essential (primary) hypertension: Secondary | ICD-10-CM | POA: Diagnosis not present

## 2021-06-10 DIAGNOSIS — Z23 Encounter for immunization: Secondary | ICD-10-CM | POA: Insufficient documentation

## 2021-06-10 DIAGNOSIS — F0390 Unspecified dementia without behavioral disturbance: Secondary | ICD-10-CM | POA: Diagnosis not present

## 2021-06-10 DIAGNOSIS — S0990XA Unspecified injury of head, initial encounter: Secondary | ICD-10-CM | POA: Diagnosis present

## 2021-06-11 ENCOUNTER — Other Ambulatory Visit: Payer: Self-pay

## 2021-06-11 ENCOUNTER — Emergency Department: Payer: Medicare Other

## 2021-06-11 ENCOUNTER — Emergency Department
Admission: EM | Admit: 2021-06-11 | Discharge: 2021-06-11 | Disposition: A | Discharge status: Home / Self Care | Payer: Medicare Other | Attending: Emergency Medicine | Admitting: Emergency Medicine

## 2021-06-11 DIAGNOSIS — S0181XA Laceration without foreign body of other part of head, initial encounter: Secondary | ICD-10-CM | POA: Diagnosis not present

## 2021-06-11 DIAGNOSIS — W19XXXA Unspecified fall, initial encounter: Secondary | ICD-10-CM

## 2021-06-11 DIAGNOSIS — S0083XA Contusion of other part of head, initial encounter: Secondary | ICD-10-CM

## 2021-06-11 LAB — TROPONIN I (HIGH SENSITIVITY): Troponin I (High Sensitivity): 4 ng/L (ref <18)

## 2021-06-11 LAB — BASIC METABOLIC PANEL
Anion gap: 6 (ref 5–15)
BUN: 16 mg/dL (ref 8–23)
CO2: 27 mmol/L (ref 22–32)
Calcium: 8.8 mg/dL — ABNORMAL LOW (ref 8.9–10.3)
Chloride: 105 mmol/L (ref 98–111)
Creatinine, Ser: 0.68 mg/dL (ref 0.44–1.00)
GFR, Estimated: >60 mL/min (ref >60)
Glucose, Bld: 141 mg/dL — ABNORMAL HIGH (ref 70–99)
Potassium: 3.6 mmol/L (ref 3.5–5.1)
Sodium: 138 mmol/L (ref 135–145)

## 2021-06-11 LAB — CBC WITH DIFFERENTIAL/PLATELET
Abs Immature Granulocytes: 0.04 10*3/uL (ref 0.00–0.07)
Basophils Absolute: 0.1 10*3/uL (ref 0.0–0.1)
Basophils Relative: 1 %
Eosinophils Absolute: 0.1 10*3/uL (ref 0.0–0.5)
Eosinophils Relative: 1 %
HCT: 34.4 % — ABNORMAL LOW (ref 36.0–46.0)
Hemoglobin: 11 g/dL — ABNORMAL LOW (ref 12.0–15.0)
Immature Granulocytes: 1 %
Lymphocytes Relative: 17 %
Lymphs Abs: 1.5 10*3/uL (ref 0.7–4.0)
MCH: 28.5 pg (ref 26.0–34.0)
MCHC: 32 g/dL (ref 30.0–36.0)
MCV: 89.1 fL (ref 80.0–100.0)
Monocytes Absolute: 0.7 10*3/uL (ref 0.1–1.0)
Monocytes Relative: 8 %
Neutro Abs: 6.3 10*3/uL (ref 1.7–7.7)
Neutrophils Relative %: 72 %
Platelets: 196 10*3/uL (ref 150–400)
RBC: 3.86 MIL/uL — ABNORMAL LOW (ref 3.87–5.11)
RDW: 13.5 % (ref 11.5–15.5)
WBC: 8.6 10*3/uL (ref 4.0–10.5)
nRBC: 0 % (ref 0.0–0.2)

## 2021-06-11 MED ORDER — TETANUS-DIPHTH-ACELL PERTUSSIS 5-2.5-18.5 LF-MCG/0.5 IM SUSY
0.5000 mL | PREFILLED_SYRINGE | Freq: Once | INTRAMUSCULAR | Status: AC
  Administered 2021-06-11: 06:00:00 0.5 mL via INTRAMUSCULAR
  Filled 2021-06-11: qty 0.5

## 2021-06-11 MED ORDER — ACETAMINOPHEN 500 MG PO TABS
1000.0000 mg | ORAL_TABLET | Freq: Once | ORAL | Status: AC
  Administered 2021-06-11: 06:00:00 1000 mg via ORAL
  Filled 2021-06-11: qty 2

## 2021-06-11 NOTE — Discharge Instructions (Addendum)

## 2021-06-11 NOTE — ED Notes (Signed)
Called [facility] for transport back to [facility]

## 2021-06-11 NOTE — ED Provider Notes (Signed)
[facility] Emergency Department Provider Note  ____________________________________________  Time seen: Approximately 5:14 AM  I have reviewed the triage vital signs and the nursing notes.   HISTORY  Chief Complaint Fall   HPI Sandra Kaufman is a 87 y.o. female with a history of dementia, CVA, frequent falls, hypertension, hyperlipidemia, hypothyroidism, COPD who presents for evaluation of unwitnessed fall.  Patient reports that she was sitting on her commode when she fell forward and hit her head on the floor.  She does not remember why or how she fell.  She has a forehead hematoma.  She is complaining of a mild headache.  She denies chest pain or shortness of breath, abdominal pain, neck pain or back pain, extremity pain.   Past Medical History:  Diagnosis Date   Arthritis    Cancer ([place])    skin   Colon polyps    COPD (chronic obstructive pulmonary disease) ([place])    Diverticulosis    Frequent falls    GERD (gastroesophageal reflux disease)    with esophageal stricture requiring dilatation x 2 (12/96 and 10/99)   Glaucoma    History of chicken pox    History of CVA (cerebrovascular accident)    Hypercholesterolemia    Hypertension    Hypothyroidism    Migraines    Peripheral neuropathy    Urinary incontinence     Patient Active Problem List   Diagnosis Date Noted   Ileus ([place]) 08/27/2016   Multiple falls 07/22/2016   Dementia ([place]) 04/22/2016   Fracture of femoral neck, left ([place]) 04/16/2016   Fracture, femur, neck, left, closed, initial encounter 04/16/2016   Vomiting 03/17/2016   Rash 03/17/2016   Diarrhea 02/18/2016   Adult hypothyroidism 01/29/2016   Fall 01/19/2016   Anemia 10/20/2015   Headache, migraine 10/10/2015   HLD (hyperlipidemia) 10/10/2015   Glaucoma 10/10/2015   Chronic obstructive pulmonary disease ([place]) 10/10/2015   Arthritis 10/10/2015   Hypertrophic toenail 07/23/2015   Health care maintenance  05/27/2015   Difficulty in walking 10/04/2014   Cerebral ataxia ([place]) 10/04/2014   Unsteady gait 08/22/2014   Obesity 08/22/2014   Dysphagia 07/14/2014   Amnesia 05/24/2014   B12 deficiency 05/24/2014   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   SOB (shortness of breath) 05/06/2014   CVA (cerebral vascular accident) ([place]) 03/04/2014   Diverticulosis 04/29/2013   GERD (gastroesophageal reflux disease) 11/27/2012   Urinary incontinence 11/27/2012   Ovarian cyst 11/27/2012   Depression 11/27/2012   Neuropathy 11/27/2012   Hypothyroidism 11/15/2012   Hypercholesterolemia 11/15/2012   Diabetes mellitus ([place]) 11/15/2012   Hypertension 11/15/2012    Past Surgical History:  Procedure Laterality Date   ABDOMINAL HYSTERECTOMY  1961   ovaries not removed   APPENDECTOMY     bladder tack  1976   CATARACT EXTRACTION  1997   bilateral   CHOLECYSTECTOMY     HIP ARTHROPLASTY Left 04/16/2016   Procedure: ARTHROPLASTY BIPOLAR HIP (HEMIARTHROPLASTY);  Surgeon: Corky Mull, MD;  Location: [facility] ORS;  Service: Orthopedics;  Laterality: Left;   INTERSTIM IMPLANT PLACEMENT  2011   TONSILLECTOMY      Prior to Admission medications   Medication Sig Start Date End Date Taking? Authorizing Provider  acetaminophen (TYLENOL) 325 MG tablet Take 650 mg by mouth 3 (three) times daily as needed for headache (neck pain).    [provider]  albuterol (PROVENTIL HFA;VENTOLIN HFA) 108 (90 BASE) MCG/ACT inhaler Inhale 2 puffs into the lungs every 6 (six) hours as needed  for wheezing or shortness of breath. Patient not taking: Reported on 09/21/2018 06/14/15   Rubbie Battiest, RN  aspirin 325 MG tablet Take 325 mg by mouth daily.    [provider]  atorvastatin (LIPITOR) 40 MG tablet Take 40 mg by mouth at bedtime.    [provider]  bimatoprost (LUMIGAN) 0.01 % SOLN Place 1 drop into both eyes at bedtime.    [provider]  Calcium Carbonate (CALCIUM OYSTER SHELL) 1250 (500 Ca) MG  TABS Take 500 mg by mouth daily.    [provider]  Coenzyme Q10 (CO Q10) 100 MG CAPS Take 100 mg by mouth daily.     [provider]  docusate sodium (COLACE) 100 MG capsule Take 1 capsule (100 mg total) by mouth 2 (two) times daily. Patient not taking: Reported on 09/21/2018 04/17/16   Hillary Bow, MD  feeding supplement, ENSURE ENLIVE, (ENSURE ENLIVE) LIQD Take 237 mLs by mouth 2 (two) times daily between meals. Patient taking differently: Take 1 Bottle by mouth daily.  04/23/16   Gladstone Lighter, MD  fluticasone (FLONASE) 50 MCG/ACT nasal spray Place 2 sprays into both nostrils daily.    [provider]  guaifenesin (ROBITUSSIN) 100 MG/5ML syrup Take 400 mg by mouth 3 (three) times daily as needed for cough.    [provider]  lactase (LACTAID) 3000 units tablet Take 3,000 Units by mouth 3 (three) times daily as needed (dairy constipation).    [provider]  levothyroxine (SYNTHROID) 112 MCG tablet Take 100 mcg by mouth daily before breakfast.     [provider]  loperamide (IMODIUM A-D) 2 MG tablet Take 2 mg by mouth as needed for diarrhea or loose stools (Up to 3 doses in 12 hours).    [provider]  LORazepam (ATIVAN) 0.5 MG tablet Take 0.5 mg by mouth 3 (three) times daily as needed. 11/02/19   [provider]  memantine (NAMENDA) 10 MG tablet Take 10 mg by mouth 2 (two) times daily.    [provider]  metoprolol tartrate (LOPRESSOR) 25 MG tablet Take 1 tablet (25 mg total) by mouth 2 (two) times daily. Patient taking differently: Take 12.5 mg by mouth 2 (two) times daily.  07/24/16   Loletha Grayer, MD  Multiple Vitamin (TAB-A-VITE) TABS Take 1 tablet by mouth daily.    [provider]  pantoprazole (PROTONIX) 40 MG tablet Take 1 tablet (40 mg total) by mouth daily. 03/09/16   Lucilla Lame, MD  sertraline (ZOLOFT) 100 MG tablet Take 200 mg by mouth daily.     [provider]   vitamin B-12 (CYANOCOBALAMIN) 1000 MCG tablet Take 1,000 mcg by mouth 2 (two) times a week.     [provider]    Allergies Tramadol  Family History  Problem Relation Age of Onset   Heart disease Father        myocardial infarction   Arthritis/Rheumatoid Mother    Diabetes Sister    Epilepsy Sister    Breast cancer Neg Hx    Colon cancer Neg Hx     Social History Social History   Tobacco Use   Smoking status: Never   Smokeless tobacco: Never  Substance Use Topics   Alcohol use: No    Alcohol/week: 0.0 standard drinks   Drug use: No    Review of Systems  Constitutional: Negative for fever. Eyes: Negative for visual changes. ENT: Negative for facial injury or neck injury Cardiovascular: Negative for chest  injury. Respiratory: Negative for shortness of breath. Negative for chest wall injury. Gastrointestinal: Negative for abdominal pain or injury. Genitourinary: Negative for dysuria. Musculoskeletal: Negative for back injury, negative for arm or leg pain. Skin: Negative for laceration/abrasions. Neurological: + for head injury.   ____________________________________________   PHYSICAL EXAM:  VITAL SIGNS: ED Triage Vitals  Enc Vitals Group     BP 06/11/21 0001 129/64     Pulse Rate 06/11/21 0001 64     Resp 06/11/21 0001 20     Temp 06/11/21 0001 98.4 F (36.9 C)     Temp Source 06/11/21 0001 Oral     SpO2 06/11/21 0001 100 %     Weight 06/11/21 0011 106 lb (48.1 kg)     Height 06/11/21 0011 5\' 3"  (1.6 m)     Head Circumference --      Peak Flow --      Pain Score 06/11/21 0005 0     Pain Loc --      Pain Edu? --      Excl. in [place]? --     Full spinal precautions maintained throughout the trauma exam. Constitutional: Alert and oriented. No acute distress. Does not appear intoxicated. HEENT Head: Normocephalic. Forehead hematoma with a superficial laceration Face: No facial bony tenderness. Stable midface Ears: No hemotympanum bilaterally.  No Battle sign Eyes: No eye injury. PERRL. No raccoon eyes Nose: Nontender. No epistaxis. No rhinorrhea Mouth/Throat: Mucous membranes are moist. No oropharyngeal blood. No dental injury. Airway patent without stridor. Normal voice. Neck: no C-collar. No midline c-spine tenderness.  Cardiovascular: Normal rate, regular rhythm. Normal and symmetric distal pulses are present in all extremities. Pulmonary/Chest: Chest wall is stable and nontender to palpation/compression. Normal respiratory effort. Breath sounds are normal. No crepitus.  Abdominal: Soft, nontender, non distended. Musculoskeletal: Nontender with normal full range of motion in all extremities. No deformities. No thoracic or lumbar midline spinal tenderness. Pelvis is stable. Skin: Skin is warm, dry and intact. No abrasions or contutions. Psychiatric: Speech and behavior are appropriate. Neurological: Normal speech and language. Moves all extremities to command. No gross focal neurologic deficits are appreciated.  Glascow Coma Score: 4 - Opens eyes on own 6 - Follows simple motor commands 5 - Alert and oriented GCS: 15   ____________________________________________   LABS (all labs ordered are listed, but only abnormal results are displayed)  Labs Reviewed  CBC WITH DIFFERENTIAL/PLATELET - Abnormal; Notable for the following components:      Result Value   RBC 3.86 (*)    Hemoglobin 11.0 (*)    HCT 34.4 (*)    All other components within normal limits  BASIC METABOLIC PANEL - Abnormal; Notable for the following components:   Glucose, Bld 141 (*)    Calcium 8.8 (*)    All other components within normal limits  TROPONIN I (HIGH SENSITIVITY)   ____________________________________________  EKG  ED ECG REPORT I, Rudene Re, the attending physician, personally viewed and interpreted this ECG.  Normal sinus rhythm, right bundle branch block, rate of 60, normal QTC, no ST elevations or depressions.  Unchanged  when compared to prior from February 2022 ____________________________________________  RADIOLOGY  I have personally reviewed the images performed during this visit and I agree with the Radiologist's read.   Interpretation by Radiologist:  CT Head Wo Contrast  Result Date: 06/11/2021 CLINICAL DATA:  Head trauma EXAM: CT HEAD WITHOUT CONTRAST CT CERVICAL SPINE WITHOUT CONTRAST TECHNIQUE: Multidetector CT imaging of the head and cervical spine  was performed following the standard protocol without intravenous contrast. Multiplanar CT image reconstructions of the cervical spine were also generated. COMPARISON:  None. FINDINGS: CT HEAD FINDINGS Brain: There is no mass, hemorrhage or extra-axial collection. There is generalized atrophy without lobar predilection. There is hypoattenuation of the periventricular white matter, most commonly indicating chronic ischemic microangiopathy. Vascular: No abnormal hyperdensity of the major intracranial arteries or dural venous sinuses. No intracranial atherosclerosis. Skull: The visualized skull base, calvarium and extracranial soft tissues are normal. Sinuses/Orbits: No fluid levels or advanced mucosal thickening of the visualized paranasal sinuses. No mastoid or middle ear effusion. The orbits are normal. CT CERVICAL SPINE FINDINGS Alignment: No static subluxation. Facets are aligned. Occipital condyles are normally positioned. Skull base and vertebrae: No acute fracture. Soft tissues and spinal canal: No prevertebral fluid or swelling. No visible canal hematoma. Disc levels: No advanced spinal canal or neural foraminal stenosis. Upper chest: No pneumothorax, pulmonary nodule or pleural effusion. Other: Normal visualized paraspinal cervical soft tissues. IMPRESSION: 1. Chronic ischemic microangiopathy and generalized atrophy without acute intracranial abnormality. 2. No acute fracture or static subluxation of the cervical spine. Electronically Signed   By: Ulyses Jarred  M.D.   On: 06/11/2021 00:43   CT Cervical Spine Wo Contrast  Result Date: 06/11/2021 CLINICAL DATA:  Head trauma EXAM: CT HEAD WITHOUT CONTRAST CT CERVICAL SPINE WITHOUT CONTRAST TECHNIQUE: Multidetector CT imaging of the head and cervical spine was performed following the standard protocol without intravenous contrast. Multiplanar CT image reconstructions of the cervical spine were also generated. COMPARISON:  None. FINDINGS: CT HEAD FINDINGS Brain: There is no mass, hemorrhage or extra-axial collection. There is generalized atrophy without lobar predilection. There is hypoattenuation of the periventricular white matter, most commonly indicating chronic ischemic microangiopathy. Vascular: No abnormal hyperdensity of the major intracranial arteries or dural venous sinuses. No intracranial atherosclerosis. Skull: The visualized skull base, calvarium and extracranial soft tissues are normal. Sinuses/Orbits: No fluid levels or advanced mucosal thickening of the visualized paranasal sinuses. No mastoid or middle ear effusion. The orbits are normal. CT CERVICAL SPINE FINDINGS Alignment: No static subluxation. Facets are aligned. Occipital condyles are normally positioned. Skull base and vertebrae: No acute fracture. Soft tissues and spinal canal: No prevertebral fluid or swelling. No visible canal hematoma. Disc levels: No advanced spinal canal or neural foraminal stenosis. Upper chest: No pneumothorax, pulmonary nodule or pleural effusion. Other: Normal visualized paraspinal cervical soft tissues. IMPRESSION: 1. Chronic ischemic microangiopathy and generalized atrophy without acute intracranial abnormality. 2. No acute fracture or static subluxation of the cervical spine. Electronically Signed   By: Ulyses Jarred M.D.   On: 06/11/2021 00:43     ____________________________________________   PROCEDURES  Procedure(s) performed:yes .Marland KitchenLaceration Repair  Date/Time: 06/11/2021 5:19 AM Performed by: Rudene Re, MD Authorized by: Rudene Re, MD   Consent:    Consent obtained:  Verbal   Consent given by:  Patient Laceration details:    Location:  Face   Face location:  Forehead   Length (cm):  2 Pre-procedure details:    Preparation:  Imaging obtained to evaluate for foreign bodies Exploration:    Imaging outcome: foreign body not noted     Wound exploration: entire depth of wound visualized     Wound extent: no fascia violation noted, no foreign bodies/material noted, no underlying fracture noted and no vascular damage noted     Contaminated: no   Treatment:    Area cleansed with:  Chlorhexidine   Amount of cleaning:  Standard Skin repair:    Repair method:  Steri-Strips and tissue adhesive   Number of Steri-Strips:  4 Approximation:    Approximation:  Close Repair type:    Repair type:  Simple Post-procedure details:    Dressing:  Open (no dressing)   Procedure completion:  Tolerated   Critical Care performed: none None ____________________________________________   INITIAL IMPRESSION / ASSESSMENT AND PLAN / ED COURSE  87 y.o. female with a history of dementia, CVA, frequent falls, hypertension, hyperlipidemia, hypothyroidism, COPD who presents for evaluation of unwitnessed fall.  Patient coming from a memory care facility.  Has a forehead hematoma and a very superficial laceration which was repaired per procedure note above.  CT head and cervical spine with no signs of traumatic injuries, visualized by me and confirmed by radiology.  EKG at patient's baseline with no changes.  Patient monitored on telemetry with no signs of dysrhythmias.  Labs showing no signs of dehydration, anemia, DKA, AKI, electrolyte derangements, or cardiac ischemia.  Patient was given a tetanus booster and Tylenol for pain.  Will discharge back to her nursing home on supportive care and follow-up with PCP.  Discussed both failure return precautions.        ____________________________________________  Please note:  Patient was evaluated in Emergency Department today for the symptoms described in the history of present illness. Patient was evaluated in the context of the global COVID-19 pandemic, which necessitated consideration that the patient might be at risk for infection with the SARS-CoV-2 virus that causes COVID-19. Institutional protocols and algorithms that pertain to the evaluation of patients at risk for COVID-19 are in a state of rapid change based on information released by regulatory bodies including the CDC and federal and state organizations. These policies and algorithms were followed during the patient's care in the ED.  Some ED evaluations and interventions may be delayed as a result of limited staffing during the pandemic.   ____________________________________________   FINAL CLINICAL IMPRESSION(S) / ED DIAGNOSES   Final diagnoses:  Fall, initial encounter  Traumatic hematoma of forehead, initial encounter      NEW MEDICATIONS STARTED DURING THIS VISIT:  ED Discharge Orders     None        Note:  This document was prepared using Dragon voice recognition software and may include unintentional dictation errors.    Rudene Re, MD 06/11/21 [phone]

## 2021-06-11 NOTE — ED Triage Notes (Signed)
Pt to triage via w/c with no distress noted; pt is VERY HOH; pt reports sat down on commode and fell forward; pt denies LOC, denies any c/o pain; hematoma and skin tear noted to mid foreheadl; EMS brought pt in from [facility]

## 2021-06-11 NOTE — ED Notes (Signed)
Report given to staff at [facility];

## 2021-07-24 ENCOUNTER — Emergency Department
Admission: EM | Admit: 2021-07-24 | Discharge: 2021-07-24 | Disposition: A | Discharge status: Home / Self Care | Payer: Medicare Other | Attending: Emergency Medicine | Admitting: Emergency Medicine

## 2021-07-24 ENCOUNTER — Emergency Department: Payer: Medicare Other

## 2021-07-24 ENCOUNTER — Other Ambulatory Visit: Payer: Self-pay

## 2021-07-24 DIAGNOSIS — Z96642 Presence of left artificial hip joint: Secondary | ICD-10-CM | POA: Insufficient documentation

## 2021-07-24 DIAGNOSIS — Y92129 Unspecified place in nursing home as the place of occurrence of the external cause: Secondary | ICD-10-CM | POA: Diagnosis not present

## 2021-07-24 DIAGNOSIS — Z7982 Long term (current) use of aspirin: Secondary | ICD-10-CM | POA: Insufficient documentation

## 2021-07-24 DIAGNOSIS — J449 Chronic obstructive pulmonary disease, unspecified: Secondary | ICD-10-CM | POA: Diagnosis not present

## 2021-07-24 DIAGNOSIS — I10 Essential (primary) hypertension: Secondary | ICD-10-CM | POA: Insufficient documentation

## 2021-07-24 DIAGNOSIS — W01198A Fall on same level from slipping, tripping and stumbling with subsequent striking against other object, initial encounter: Secondary | ICD-10-CM | POA: Diagnosis not present

## 2021-07-24 DIAGNOSIS — E039 Hypothyroidism, unspecified: Secondary | ICD-10-CM | POA: Insufficient documentation

## 2021-07-24 DIAGNOSIS — Z79899 Other long term (current) drug therapy: Secondary | ICD-10-CM | POA: Diagnosis not present

## 2021-07-24 DIAGNOSIS — F0390 Unspecified dementia without behavioral disturbance: Secondary | ICD-10-CM | POA: Diagnosis not present

## 2021-07-24 DIAGNOSIS — Z85828 Personal history of other malignant neoplasm of skin: Secondary | ICD-10-CM | POA: Diagnosis not present

## 2021-07-24 DIAGNOSIS — S0990XA Unspecified injury of head, initial encounter: Secondary | ICD-10-CM

## 2021-07-24 DIAGNOSIS — E119 Type 2 diabetes mellitus without complications: Secondary | ICD-10-CM | POA: Insufficient documentation

## 2021-07-24 DIAGNOSIS — W19XXXA Unspecified fall, initial encounter: Secondary | ICD-10-CM

## 2021-07-24 NOTE — ED Notes (Signed)
Pt found in the hallway outside flex wait walking with her wheelchair- pt asking how much longer the wait would be- pt placed back in wheelchair and brought to room 41

## 2021-07-24 NOTE — ED Triage Notes (Signed)
Pt presents to the ED via EMS from [facility] with c/o head injury after mechanical fall. Pt states that she attempted to grab the rail and stand up but missed the rail and fell. Pt states that she hit her head and is now having neck pain as well. Pt denies any other symptoms at this time. No anti-coagulants found on pt's MAR

## 2021-07-24 NOTE — Discharge Instructions (Addendum)
Return to the ER with any worsening symptoms.

## 2021-07-24 NOTE — ED Notes (Signed)
Attempted to call report to [facility]- they answered phone and then hung up after introducing self

## 2021-07-24 NOTE — ED Notes (Signed)
High fall precautions in place including bed alarm.

## 2021-07-24 NOTE — ED Triage Notes (Signed)
Pt comes into the ED via EMS from [facility], for a witnessed fall, tripped hit her head posterior left side of head. Pt is at baseline on arrival.

## 2021-07-24 NOTE — ED Notes (Signed)
[facility]  CALLED  FOR TRANSPORT TO  [place]

## 2021-07-24 NOTE — ED Provider Notes (Signed)
[facility] Emergency Department Provider Note  ____________________________________________   Event Date/Time   First MD Initiated Contact with Patient 07/24/21 1755     (approximate)  I have reviewed the triage vital signs and the nursing notes.   HISTORY  Chief Complaint Fall and Head Injury  History limited by dementia  HPI Sandra Kaufman is a 87 y.o. female who presents to the ER via EMS from [facility] for evaluation of mechanical fall. Patient reportedly had a witnessed fall in attempting to transfer and grabbing a rail, but missed holding the rail. She hit the back of her head on the way down to the ground. She was transported here for further evaluation, no known blood thinners in her chart. While patient does have dementia, she denies any pain other than on her head.        Past Medical History:  Diagnosis Date   Arthritis    Cancer ([place])    skin   Colon polyps    COPD (chronic obstructive pulmonary disease) ([place])    Diverticulosis    Frequent falls    GERD (gastroesophageal reflux disease)    with esophageal stricture requiring dilatation x 2 (12/96 and 10/99)   Glaucoma    History of chicken pox    History of CVA (cerebrovascular accident)    Hypercholesterolemia    Hypertension    Hypothyroidism    Migraines    Peripheral neuropathy    Urinary incontinence     Patient Active Problem List   Diagnosis Date Noted   Ileus ([place]) 08/27/2016   Multiple falls 07/22/2016   Dementia ([place]) 04/22/2016   Fracture of femoral neck, left ([place]) 04/16/2016   Fracture, femur, neck, left, closed, initial encounter 04/16/2016   Vomiting 03/17/2016   Rash 03/17/2016   Diarrhea 02/18/2016   Adult hypothyroidism 01/29/2016   Fall 01/19/2016   Anemia 10/20/2015   Headache, migraine 10/10/2015   HLD (hyperlipidemia) 10/10/2015   Glaucoma 10/10/2015   Chronic obstructive pulmonary disease ([place]) 10/10/2015   Arthritis 10/10/2015    Hypertrophic toenail 07/23/2015   Health care maintenance 05/27/2015   Difficulty in walking 10/04/2014   Cerebral ataxia ([place]) 10/04/2014   Unsteady gait 08/22/2014   Obesity 08/22/2014   Dysphagia 07/14/2014   Amnesia 05/24/2014   B12 deficiency 05/24/2014   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   SOB (shortness of breath) 05/06/2014   CVA (cerebral vascular accident) ([place]) 03/04/2014   Diverticulosis 04/29/2013   GERD (gastroesophageal reflux disease) 11/27/2012   Urinary incontinence 11/27/2012   Ovarian cyst 11/27/2012   Depression 11/27/2012   Neuropathy 11/27/2012   Hypothyroidism 11/15/2012   Hypercholesterolemia 11/15/2012   Diabetes mellitus ([place]) 11/15/2012   Hypertension 11/15/2012    Past Surgical History:  Procedure Laterality Date   ABDOMINAL HYSTERECTOMY  1961   ovaries not removed   APPENDECTOMY     bladder tack  1976   CATARACT EXTRACTION  1997   bilateral   CHOLECYSTECTOMY     HIP ARTHROPLASTY Left 04/16/2016   Procedure: ARTHROPLASTY BIPOLAR HIP (HEMIARTHROPLASTY);  Surgeon: Corky Mull, MD;  Location: [facility] ORS;  Service: Orthopedics;  Laterality: Left;   INTERSTIM IMPLANT PLACEMENT  2011   TONSILLECTOMY      Prior to Admission medications   Medication Sig Start Date End Date Taking? Authorizing Provider  acetaminophen (TYLENOL) 325 MG tablet Take 650 mg by mouth 3 (three) times daily as needed for headache (neck pain).    [provider]  albuterol (PROVENTIL  HFA;VENTOLIN HFA) 108 (90 BASE) MCG/ACT inhaler Inhale 2 puffs into the lungs every 6 (six) hours as needed for wheezing or shortness of breath. Patient not taking: Reported on 09/21/2018 06/14/15   Rubbie Battiest, RN  aspirin 325 MG tablet Take 325 mg by mouth daily.    [provider]  atorvastatin (LIPITOR) 40 MG tablet Take 40 mg by mouth at bedtime.    [provider]  bimatoprost (LUMIGAN) 0.01 % SOLN Place 1 drop into both eyes at bedtime.    [provider]   Calcium Carbonate (CALCIUM OYSTER SHELL) 1250 (500 Ca) MG TABS Take 500 mg by mouth daily.    [provider]  Coenzyme Q10 (CO Q10) 100 MG CAPS Take 100 mg by mouth daily.     [provider]  docusate sodium (COLACE) 100 MG capsule Take 1 capsule (100 mg total) by mouth 2 (two) times daily. Patient not taking: Reported on 09/21/2018 04/17/16   Hillary Bow, MD  feeding supplement, ENSURE ENLIVE, (ENSURE ENLIVE) LIQD Take 237 mLs by mouth 2 (two) times daily between meals. Patient taking differently: Take 1 Bottle by mouth daily.  04/23/16   Gladstone Lighter, MD  fluticasone (FLONASE) 50 MCG/ACT nasal spray Place 2 sprays into both nostrils daily.    [provider]  guaifenesin (ROBITUSSIN) 100 MG/5ML syrup Take 400 mg by mouth 3 (three) times daily as needed for cough.    [provider]  lactase (LACTAID) 3000 units tablet Take 3,000 Units by mouth 3 (three) times daily as needed (dairy constipation).    [provider]  levothyroxine (SYNTHROID) 112 MCG tablet Take 100 mcg by mouth daily before breakfast.     [provider]  loperamide (IMODIUM A-D) 2 MG tablet Take 2 mg by mouth as needed for diarrhea or loose stools (Up to 3 doses in 12 hours).    [provider]  LORazepam (ATIVAN) 0.5 MG tablet Take 0.5 mg by mouth 3 (three) times daily as needed. 11/02/19   [provider]  memantine (NAMENDA) 10 MG tablet Take 10 mg by mouth 2 (two) times daily.    [provider]  metoprolol tartrate (LOPRESSOR) 25 MG tablet Take 1 tablet (25 mg total) by mouth 2 (two) times daily. Patient taking differently: Take 12.5 mg by mouth 2 (two) times daily.  07/24/16   Loletha Grayer, MD  Multiple Vitamin (TAB-A-VITE) TABS Take 1 tablet by mouth daily.    [provider]  pantoprazole (PROTONIX) 40 MG tablet Take 1 tablet (40 mg total) by mouth daily. 03/09/16   Lucilla Lame, MD  sertraline (ZOLOFT) 100 MG tablet Take  200 mg by mouth daily.     [provider]  vitamin B-12 (CYANOCOBALAMIN) 1000 MCG tablet Take 1,000 mcg by mouth 2 (two) times a week.     [provider]    Allergies Tramadol  Family History  Problem Relation Age of Onset   Heart disease Father        myocardial infarction   Arthritis/Rheumatoid Mother    Diabetes Sister    Epilepsy Sister    Breast cancer Neg Hx    Colon cancer Neg Hx     Social History Social History   Tobacco Use   Smoking status: Never   Smokeless tobacco: Never  Substance Use Topics   Alcohol use: No    Alcohol/week: 0.0 standard drinks   Drug use: No    Review of Systems Constitutional: No fever/chills  Eyes: No visual changes. ENT: No sore throat. Cardiovascular: Denies chest pain. Respiratory: Denies shortness of breath. Gastrointestinal: No abdominal pain.  No nausea, no vomiting.  No diarrhea.  No constipation. Genitourinary: Negative for dysuria. Musculoskeletal: Negative for back pain. Skin: Negative for rash. Neurological: + for headaches, negative focal weakness or numbness.   ____________________________________________   PHYSICAL EXAM:  VITAL SIGNS: ED Triage Vitals [07/24/21 1547]  Enc Vitals Group     BP 128/65     Pulse Rate 70     Resp 16     Temp 97.7 F (36.5 C)     Temp Source Oral     SpO2 99 %     Weight 106 lb (48.1 kg)     Height '5\' 3"'$  (1.6 m)     Head Circumference      Peak Flow      Pain Score 5     Pain Loc      Pain Edu?      Excl. in [place]?    Constitutional: Alert and oriented to self only. Pleasantly demented.  Eyes: Conjunctivae are normal. PERRL. EOMI. Head: Mild soft tissue swelling over left parietal region. No obvious hematoma, no lacerations. Nose: No congestion/rhinnorhea. Mouth/Throat: Mucous membranes are moist.   Neck: No stridor. No tenderness to palpation of the midline or paraspinals of the cervical spine. Cardiovascular: Normal rate, regular rhythm. Grossly  normal heart sounds.   Respiratory: Normal respiratory effort.  No retractions.  Musculoskeletal: No lower extremity tenderness nor edema.  Negative log roll of her hips bilaterally. No pain with compression of hte pelvis. No joint effusions. Neurologic:  CN II-XII grossly intact. Skin:  Skin is warm, dry and intact. No rash noted. Psychiatric: Mood and affect are normal. Speech and behavior are normal.   ____________________________________________  RADIOLOGY  Official radiology report(s): CT Head Wo Contrast  Result Date: 07/24/2021 CLINICAL DATA:  Fall, head and neck pain EXAM: CT HEAD WITHOUT CONTRAST CT CERVICAL SPINE WITHOUT CONTRAST TECHNIQUE: Multidetector CT imaging of the head and cervical spine was performed following the standard protocol without intravenous contrast. Multiplanar CT image reconstructions of the cervical spine were also generated. COMPARISON:  06/11/2021 FINDINGS: CT HEAD FINDINGS Brain: No evidence of acute infarction, hemorrhage, hydrocephalus, extra-axial collection or mass lesion/mass effect. Mild periventricular and deep white matter hypodensity. Mild global cerebral volume loss. Vascular: No hyperdense vessel or unexpected calcification. Skull: Normal. Negative for fracture or focal lesion. Sinuses/Orbits: No acute finding. Other: Soft tissue contusion over the left parietal vertex (series 3, image 19). CT CERVICAL SPINE FINDINGS Alignment: Degenerative straightening of the normal cervical lordosis. Skull base and vertebrae: No acute fracture. No primary bone lesion or focal pathologic process. Soft tissues and spinal canal: No prevertebral fluid or swelling. No visible canal hematoma. Disc levels: Moderate to severe multilevel disc space height loss and osteophytosis throughout the cervical spine. Upper chest: Negative. Other: None. IMPRESSION: 1. No acute intracranial pathology. Small-vessel white matter disease and cerebral volume loss in keeping with advanced  patient age. 2. Soft tissue contusion over the left parietal vertex. 3. No fracture or static subluxation of the cervical spine. 4. Moderate to severe multilevel cervical disc degenerative disease. Electronically Signed   By: Eddie Candle M.D.   On: 07/24/2021 16:26   CT Cervical Spine Wo Contrast  Result Date: 07/24/2021 CLINICAL DATA:  Fall, head and neck pain EXAM: CT HEAD WITHOUT CONTRAST CT CERVICAL SPINE WITHOUT CONTRAST TECHNIQUE: Multidetector CT imaging of the head and cervical  spine was performed following the standard protocol without intravenous contrast. Multiplanar CT image reconstructions of the cervical spine were also generated. COMPARISON:  06/11/2021 FINDINGS: CT HEAD FINDINGS Brain: No evidence of acute infarction, hemorrhage, hydrocephalus, extra-axial collection or mass lesion/mass effect. Mild periventricular and deep white matter hypodensity. Mild global cerebral volume loss. Vascular: No hyperdense vessel or unexpected calcification. Skull: Normal. Negative for fracture or focal lesion. Sinuses/Orbits: No acute finding. Other: Soft tissue contusion over the left parietal vertex (series 3, image 19). CT CERVICAL SPINE FINDINGS Alignment: Degenerative straightening of the normal cervical lordosis. Skull base and vertebrae: No acute fracture. No primary bone lesion or focal pathologic process. Soft tissues and spinal canal: No prevertebral fluid or swelling. No visible canal hematoma. Disc levels: Moderate to severe multilevel disc space height loss and osteophytosis throughout the cervical spine. Upper chest: Negative. Other: None. IMPRESSION: 1. No acute intracranial pathology. Small-vessel white matter disease and cerebral volume loss in keeping with advanced patient age. 2. Soft tissue contusion over the left parietal vertex. 3. No fracture or static subluxation of the cervical spine. 4. Moderate to severe multilevel cervical disc degenerative disease. Electronically Signed   By: Eddie Candle M.D.   On: 07/24/2021 16:26     ____________________________________________   INITIAL IMPRESSION / ASSESSMENT AND PLAN / ED COURSE  As part of my medical decision making, I reviewed the following data within the [facility] notes reviewed and incorporated and Notes from prior ED visits        Patient is a 87 yo female from assisted living facility coming for evaluation of mechanical fall. See HPI. In triage, patient has mild hypertension but otherwise normal vital signs. On PE, patient is pleasantly demented with orientation only to self; no extremity abnormalities noted and patient was found to be ambulating by nursing staff.   CT of the head and neck are obtained and without any gross acute abnormality.   Findings discussed with patient. She is stable at this time for outpatient follow up and return to her facility.       ____________________________________________   FINAL CLINICAL IMPRESSION(S) / ED DIAGNOSES  Final diagnoses:  Fall, initial encounter  Injury of head, initial encounter     ED Discharge Orders     None        Note:  This document was prepared using Dragon voice recognition software and may include unintentional dictation errors.    Marlana Salvage, PA 07/24/21 1859    Vladimir Crofts, MD 07/25/21 [phone]

## 2021-08-04 ENCOUNTER — Emergency Department: Payer: Medicare Other

## 2021-08-04 ENCOUNTER — Other Ambulatory Visit: Payer: Self-pay

## 2021-08-04 ENCOUNTER — Encounter: Payer: Self-pay | Admitting: Emergency Medicine

## 2021-08-04 DIAGNOSIS — Z7982 Long term (current) use of aspirin: Secondary | ICD-10-CM | POA: Diagnosis not present

## 2021-08-04 DIAGNOSIS — Z85828 Personal history of other malignant neoplasm of skin: Secondary | ICD-10-CM | POA: Diagnosis not present

## 2021-08-04 DIAGNOSIS — E039 Hypothyroidism, unspecified: Secondary | ICD-10-CM | POA: Insufficient documentation

## 2021-08-04 DIAGNOSIS — E1142 Type 2 diabetes mellitus with diabetic polyneuropathy: Secondary | ICD-10-CM | POA: Diagnosis not present

## 2021-08-04 DIAGNOSIS — J449 Chronic obstructive pulmonary disease, unspecified: Secondary | ICD-10-CM | POA: Insufficient documentation

## 2021-08-04 DIAGNOSIS — Z79899 Other long term (current) drug therapy: Secondary | ICD-10-CM | POA: Diagnosis not present

## 2021-08-04 DIAGNOSIS — I10 Essential (primary) hypertension: Secondary | ICD-10-CM | POA: Insufficient documentation

## 2021-08-04 DIAGNOSIS — Y92129 Unspecified place in nursing home as the place of occurrence of the external cause: Secondary | ICD-10-CM | POA: Diagnosis not present

## 2021-08-04 DIAGNOSIS — S0990XA Unspecified injury of head, initial encounter: Secondary | ICD-10-CM | POA: Insufficient documentation

## 2021-08-04 DIAGNOSIS — M25552 Pain in left hip: Secondary | ICD-10-CM | POA: Insufficient documentation

## 2021-08-04 DIAGNOSIS — E1139 Type 2 diabetes mellitus with other diabetic ophthalmic complication: Secondary | ICD-10-CM | POA: Insufficient documentation

## 2021-08-04 DIAGNOSIS — M25551 Pain in right hip: Secondary | ICD-10-CM | POA: Insufficient documentation

## 2021-08-04 NOTE — ED Triage Notes (Signed)
Pt from [facility] where she had a witnessed fall while walking down the hallway with her walker. Staff reports that she was having bilat hip pain. When ask if her hips hurts she states "they smart"

## 2021-08-04 NOTE — ED Triage Notes (Signed)
Pt arrives via [facility]. Per reports by [employer].  [facility] memory care. Fell walking down hallway, witnessed, bilateral hip pain. Able to stand and sit on stretcher. Normally walks with a walker. VSS en route.

## 2021-08-04 NOTE — ED Notes (Signed)
Pt placed in triage in recliner with warm blankets

## 2021-08-05 ENCOUNTER — Emergency Department: Payer: Medicare Other

## 2021-08-05 ENCOUNTER — Emergency Department
Admission: EM | Admit: 2021-08-05 | Discharge: 2021-08-05 | Disposition: A | Discharge status: Other Acute Inpatient Hospital | Payer: Medicare Other | Attending: Emergency Medicine | Admitting: Emergency Medicine

## 2021-08-05 DIAGNOSIS — S0990XA Unspecified injury of head, initial encounter: Secondary | ICD-10-CM | POA: Diagnosis not present

## 2021-08-05 DIAGNOSIS — W19XXXA Unspecified fall, initial encounter: Secondary | ICD-10-CM

## 2021-08-05 NOTE — ED Notes (Signed)
Pt in CT.

## 2021-08-05 NOTE — ED Notes (Signed)
Pt placed on bed pan for relief of urine. Pt cleaned and dried with brief in place. Pts shoes and soiled pants in bag to go back to facility.

## 2021-08-05 NOTE — ED Provider Notes (Signed)
[facility] Emergency Department Provider Note  Time seen: 2:04 AM  I have reviewed the triage vital signs and the nursing notes.   HISTORY  Chief Complaint Fall  HPI Sandra Kaufman is a 87 y.o. female with a past medical history of arthritis, COPD, gastric reflux, hypertension, hyperlipidemia, presents emergency department from her nursing facility for a fall.  According to the facility patient had a witnessed fall while walking down the hallway with her walker.  Patient was complaining of bilateral hip pain per staff.  Here the patient denies any complaints and does not recall the fall.  Unable to provide any additional history.  Patient is awake alert and calm/cooperative, pleasant.  Past Medical History:  Diagnosis Date   Arthritis    Cancer ([place])    skin   Colon polyps    COPD (chronic obstructive pulmonary disease) ([place])    Diverticulosis    Frequent falls    GERD (gastroesophageal reflux disease)    with esophageal stricture requiring dilatation x 2 (12/96 and 10/99)   Glaucoma    History of chicken pox    History of CVA (cerebrovascular accident)    Hypercholesterolemia    Hypertension    Hypothyroidism    Migraines    Peripheral neuropathy    Urinary incontinence     Patient Active Problem List   Diagnosis Date Noted   Ileus ([place]) 08/27/2016   Multiple falls 07/22/2016   Dementia ([place]) 04/22/2016   Fracture of femoral neck, left ([place]) 04/16/2016   Fracture, femur, neck, left, closed, initial encounter 04/16/2016   Vomiting 03/17/2016   Rash 03/17/2016   Diarrhea 02/18/2016   Adult hypothyroidism 01/29/2016   Fall 01/19/2016   Anemia 10/20/2015   Headache, migraine 10/10/2015   HLD (hyperlipidemia) 10/10/2015   Glaucoma 10/10/2015   Chronic obstructive pulmonary disease ([place]) 10/10/2015   Arthritis 10/10/2015   Hypertrophic toenail 07/23/2015   Health care maintenance 05/27/2015   Difficulty in walking 10/04/2014   Cerebral  ataxia ([place]) 10/04/2014   Unsteady gait 08/22/2014   Obesity 08/22/2014   Dysphagia 07/14/2014   Amnesia 05/24/2014   B12 deficiency 05/24/2014   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   SOB (shortness of breath) 05/06/2014   CVA (cerebral vascular accident) ([place]) 03/04/2014   Diverticulosis 04/29/2013   GERD (gastroesophageal reflux disease) 11/27/2012   Urinary incontinence 11/27/2012   Ovarian cyst 11/27/2012   Depression 11/27/2012   Neuropathy 11/27/2012   Hypothyroidism 11/15/2012   Hypercholesterolemia 11/15/2012   Diabetes mellitus ([place]) 11/15/2012   Hypertension 11/15/2012    Past Surgical History:  Procedure Laterality Date   ABDOMINAL HYSTERECTOMY  1961   ovaries not removed   APPENDECTOMY     bladder tack  1976   CATARACT EXTRACTION  1997   bilateral   CHOLECYSTECTOMY     HIP ARTHROPLASTY Left 04/16/2016   Procedure: ARTHROPLASTY BIPOLAR HIP (HEMIARTHROPLASTY);  Surgeon: Corky Mull, MD;  Location: [facility] ORS;  Service: Orthopedics;  Laterality: Left;   INTERSTIM IMPLANT PLACEMENT  2011   TONSILLECTOMY      Prior to Admission medications   Medication Sig Start Date End Date Taking? Authorizing Provider  acetaminophen (TYLENOL) 325 MG tablet Take 650 mg by mouth 3 (three) times daily as needed for headache (neck pain).    [provider]  albuterol (PROVENTIL HFA;VENTOLIN HFA) 108 (90 BASE) MCG/ACT inhaler Inhale 2 puffs into the lungs every 6 (six) hours as needed for wheezing or shortness of breath. Patient not taking: Reported  on 09/21/2018 06/14/15   Rubbie Battiest, RN  aspirin 325 MG tablet Take 325 mg by mouth daily.    [provider]  atorvastatin (LIPITOR) 40 MG tablet Take 40 mg by mouth at bedtime.    [provider]  bimatoprost (LUMIGAN) 0.01 % SOLN Place 1 drop into both eyes at bedtime.    [provider]  Calcium Carbonate (CALCIUM OYSTER SHELL) 1250 (500 Ca) MG TABS Take 500 mg by mouth daily.    [provider]   Coenzyme Q10 (CO Q10) 100 MG CAPS Take 100 mg by mouth daily.     [provider]  docusate sodium (COLACE) 100 MG capsule Take 1 capsule (100 mg total) by mouth 2 (two) times daily. Patient not taking: Reported on 09/21/2018 04/17/16   Hillary Bow, MD  feeding supplement, ENSURE ENLIVE, (ENSURE ENLIVE) LIQD Take 237 mLs by mouth 2 (two) times daily between meals. Patient taking differently: Take 1 Bottle by mouth daily.  04/23/16   Gladstone Lighter, MD  fluticasone (FLONASE) 50 MCG/ACT nasal spray Place 2 sprays into both nostrils daily.    [provider]  guaifenesin (ROBITUSSIN) 100 MG/5ML syrup Take 400 mg by mouth 3 (three) times daily as needed for cough.    [provider]  lactase (LACTAID) 3000 units tablet Take 3,000 Units by mouth 3 (three) times daily as needed (dairy constipation).    [provider]  levothyroxine (SYNTHROID) 112 MCG tablet Take 100 mcg by mouth daily before breakfast.     [provider]  loperamide (IMODIUM A-D) 2 MG tablet Take 2 mg by mouth as needed for diarrhea or loose stools (Up to 3 doses in 12 hours).    [provider]  LORazepam (ATIVAN) 0.5 MG tablet Take 0.5 mg by mouth 3 (three) times daily as needed. 11/02/19   [provider]  memantine (NAMENDA) 10 MG tablet Take 10 mg by mouth 2 (two) times daily.    [provider]  metoprolol tartrate (LOPRESSOR) 25 MG tablet Take 1 tablet (25 mg total) by mouth 2 (two) times daily. Patient taking differently: Take 12.5 mg by mouth 2 (two) times daily.  07/24/16   Loletha Grayer, MD  Multiple Vitamin (TAB-A-VITE) TABS Take 1 tablet by mouth daily.    [provider]  pantoprazole (PROTONIX) 40 MG tablet Take 1 tablet (40 mg total) by mouth daily. 03/09/16   Lucilla Lame, MD  sertraline (ZOLOFT) 100 MG tablet Take 200 mg by mouth daily.     [provider]  vitamin B-12 (CYANOCOBALAMIN) 1000 MCG tablet Take 1,000 mcg by  mouth 2 (two) times a week.     [provider]    Allergies  Allergen Reactions   Tramadol Nausea And Vomiting    Family History  Problem Relation Age of Onset   Heart disease Father        myocardial infarction   Arthritis/Rheumatoid Mother    Diabetes Sister    Epilepsy Sister    Breast cancer Neg Hx    Colon cancer Neg Hx     Social History Social History   Tobacco Use   Smoking status: Never   Smokeless tobacco: Never  Substance Use Topics   Alcohol use: No    Alcohol/week: 0.0 standard drinks   Drug use: No    Review of Systems Unable to obtain adequate/accurate review of systems secondary to baseline dementia.  ____________________________________________   PHYSICAL EXAM:  VITAL SIGNS: ED Triage  Vitals  Enc Vitals Group     BP 08/04/21 2037 137/71     Pulse Rate 08/04/21 2037 70     Resp 08/04/21 2037 16     Temp 08/04/21 2037 97.9 F (36.6 C)     Temp Source 08/04/21 2037 Oral     SpO2 08/04/21 2037 96 %     Weight 08/04/21 2038 106 lb (48.1 kg)     Height 08/04/21 2038 '5\' 3"'$  (1.6 m)     Head Circumference --      Peak Flow --      Pain Score 08/05/21 0145 3     Pain Loc --      Pain Edu? --      Excl. in [place]? --    Constitutional: Awake alert, no acute distress. Eyes: Normal exam ENT      Head: Normocephalic and atraumatic.      Mouth/Throat: Mucous membranes are moist. Cardiovascular: Normal rate, regular rhythm. No murmur Respiratory: Normal respiratory effort without tachypnea nor retractions. Breath sounds are clear  Gastrointestinal: Soft and nontender. No distention.  Musculoskeletal: Nontender with normal range of motion in all extremities.  Neurologic:  Normal speech and language. No gross focal neurologic deficits Skin:  Skin is warm, dry and intact.  Psychiatric: Mood and affect are normal.  ____________________________________________  RADIOLOGY  Hip x-rays are negative for acute abnormality. CT scan of the head  is negative for acute abnormality. ____________________________________________   INITIAL IMPRESSION / ASSESSMENT AND PLAN / ED COURSE  Pertinent labs & imaging results that were available during my care of the patient were reviewed by me and considered in my medical decision making (see chart for details).   Patient presents emergency department for a witnessed fall at her nursing facility.  Patient has baseline dementia and cannot contribute to her history or review of systems.  Per staff was complaining of bilateral hip pain, had no complaints in the emergency department.  We will obtain x-rays of the hip as a precaution as well as a CT scan of the head.  Overall patient appears well, no distress.  Great range of motion all extremities without pain elicited.  CT scan of the head is negative.  X-ray negative.  Patient appears well.  We will discharge the patient home with PCP follow-up as needed.  Maybelle Paylor Routson was evaluated in Emergency Department on 08/05/2021 for the symptoms described in the history of present illness. She was evaluated in the context of the global COVID-19 pandemic, which necessitated consideration that the patient might be at risk for infection with the SARS-CoV-2 virus that causes COVID-19. Institutional protocols and algorithms that pertain to the evaluation of patients at risk for COVID-19 are in a state of rapid change based on information released by regulatory bodies including the CDC and federal and state organizations. These policies and algorithms were followed during the patient's care in the ED.  ____________________________________________   FINAL CLINICAL IMPRESSION(S) / ED DIAGNOSES  Cheral Marker, MD 08/05/21 [phone]

## 2021-08-05 NOTE — ED Notes (Signed)
Pt assisted with the bedpan

## 2022-04-30 ENCOUNTER — Emergency Department: Payer: Medicare Other

## 2022-04-30 ENCOUNTER — Emergency Department
Admission: EM | Admit: 2022-04-30 | Discharge: 2022-04-30 | Disposition: A | Discharge status: Skilled Nursing Facility | Payer: Medicare Other | Attending: Emergency Medicine | Admitting: Emergency Medicine

## 2022-04-30 ENCOUNTER — Encounter: Payer: Self-pay | Admitting: Emergency Medicine

## 2022-04-30 DIAGNOSIS — I10 Essential (primary) hypertension: Secondary | ICD-10-CM | POA: Diagnosis not present

## 2022-04-30 DIAGNOSIS — S0512XA Contusion of eyeball and orbital tissues, left eye, initial encounter: Secondary | ICD-10-CM | POA: Insufficient documentation

## 2022-04-30 DIAGNOSIS — S0083XA Contusion of other part of head, initial encounter: Secondary | ICD-10-CM

## 2022-04-30 DIAGNOSIS — F0390 Unspecified dementia without behavioral disturbance: Secondary | ICD-10-CM | POA: Diagnosis not present

## 2022-04-30 DIAGNOSIS — W19XXXA Unspecified fall, initial encounter: Secondary | ICD-10-CM | POA: Insufficient documentation

## 2022-04-30 DIAGNOSIS — Y9289 Other specified places as the place of occurrence of the external cause: Secondary | ICD-10-CM | POA: Insufficient documentation

## 2022-04-30 DIAGNOSIS — J449 Chronic obstructive pulmonary disease, unspecified: Secondary | ICD-10-CM | POA: Insufficient documentation

## 2022-04-30 DIAGNOSIS — S0993XA Unspecified injury of face, initial encounter: Secondary | ICD-10-CM | POA: Diagnosis present

## 2022-04-30 LAB — BASIC METABOLIC PANEL
Anion gap: 7 (ref 5–15)
BUN: 28 mg/dL — ABNORMAL HIGH (ref 8–23)
CO2: 26 mmol/L (ref 22–32)
Calcium: 8.6 mg/dL — ABNORMAL LOW (ref 8.9–10.3)
Chloride: 105 mmol/L (ref 98–111)
Creatinine, Ser: 1.04 mg/dL — ABNORMAL HIGH (ref 0.44–1.00)
GFR, Estimated: 52 mL/min — ABNORMAL LOW (ref >60)
Glucose, Bld: 112 mg/dL — ABNORMAL HIGH (ref 70–99)
Potassium: 4.3 mmol/L (ref 3.5–5.1)
Sodium: 138 mmol/L (ref 135–145)

## 2022-04-30 LAB — URINALYSIS, ROUTINE W REFLEX MICROSCOPIC
Bilirubin Urine: NEGATIVE
Glucose, UA: NEGATIVE mg/dL
Hgb urine dipstick: NEGATIVE
Ketones, ur: NEGATIVE mg/dL
Leukocytes,Ua: NEGATIVE
Nitrite: NEGATIVE
Protein, ur: NEGATIVE mg/dL
Specific Gravity, Urine: 1.015 (ref 1.005–1.030)
pH: 6 (ref 5.0–8.0)

## 2022-04-30 LAB — CBC
HCT: 31.5 % — ABNORMAL LOW (ref 36.0–46.0)
Hemoglobin: 9.5 g/dL — ABNORMAL LOW (ref 12.0–15.0)
MCH: 27.2 pg (ref 26.0–34.0)
MCHC: 30.2 g/dL (ref 30.0–36.0)
MCV: 90.3 fL (ref 80.0–100.0)
Platelets: 156 10*3/uL (ref 150–400)
RBC: 3.49 MIL/uL — ABNORMAL LOW (ref 3.87–5.11)
RDW: 14.1 % (ref 11.5–15.5)
WBC: 5.3 10*3/uL (ref 4.0–10.5)
nRBC: 0 % (ref 0.0–0.2)

## 2022-04-30 NOTE — ED Notes (Signed)
This RN and nursing student assisted pt. To toilet with wheelchair. Pt. Is x2 assist, unable to urinate. Pt. Assisted back to stretcher. ?

## 2022-04-30 NOTE — ED Notes (Signed)
Transferred pt. To rm 10 to do in and out cath to obtain urine sample. ?

## 2022-04-30 NOTE — Discharge Instructions (Addendum)
IMPRESSION: ?1. Small amount of left periorbital soft tissue swelling. No ?underlying displaced facial bone fractures or skull fractures. No ?evidence of significant acute traumatic injury to the brain. ?2. No evidence of significant acute traumatic injury to the cervical ?spine. ?3. Moderate cerebral and cerebellar atrophy with extensive chronic ?microvascular ischemic changes in the cerebral white matter and old ?bilateral basal ganglia lacunar infarcts, as above. ?4. Multilevel degenerative disc disease and cervical spondylosis. ?5. Chronic paranasal sinus disease in the right maxillary sinus ?incidentally noted. ?  ?

## 2022-04-30 NOTE — ED Provider Notes (Signed)
? ?[facility] ?Provider Note ? ? ? Event Date/Time  ? First MD Initiated Contact with Patient 04/30/22 [phone]   ?  (approximate) ? ? ?History  ? ?Fall ? ? ?HPI ? ?Sandra Kaufman is a 88 y.o. female  with dementia who comes in from [facility] for unwitnessed fall.  Staff found her on the floor with a hematoma to the left eye.  Patient denies any pain. ? ?Patient has a medical history of COPD, arthritis, hypertension, hyperlipidemia.  Given patient's history of dementia I am unable to get a full HPI from her.  She is oriented to self but is denying any pain.  She does report a fall but she states she is not sure how she has fallen. ? ? ?Tdap 2022 updated.  ? ?  ? ? ?Physical Exam  ? ?Triage Vital Signs: ?ED Triage Vitals [04/30/22 0659]  ?Enc Vitals Group  ?   BP (!) 147/73  ?   Pulse Rate (!) 57  ?   Resp 16  ?   Temp 97.9 ?F (36.6 ?C)  ?   Temp Source Oral  ?   SpO2 99 %  ?   Weight   ?   Height   ?   Head Circumference   ?   Peak Flow   ?   Pain Score   ?   Pain Loc   ?   Pain Edu?   ?   Excl. in [place]?   ? ? ?Most recent vital signs: ?Vitals:  ? 04/30/22 0659  ?BP: (!) 147/73  ?Pulse: (!) 57  ?Resp: 16  ?Temp: 97.9 ?F (36.6 ?C)  ?SpO2: 99%  ? ? ? ?General: Awake, no distress.  ?CV:  Good peripheral perfusion.  Soft and nontender ?Resp:  Normal effort.  ?Abd:  No distention.  Soft and nontender ?Other:  Patient has a little bruising around the left eye but pupils are equal in size and extraocular movements are intact.  No significant tenderness.  Patient is able to lift both legs up off the bed without any pain.  Patient is also able to lift both arms up off the bed without any pain. ? ? ?ED Results / Procedures / Treatments  ? ?Labs ?(all labs ordered are listed, but only abnormal results are displayed) ?Labs Reviewed  ?CBC  ?BASIC METABOLIC PANEL  ?URINALYSIS, ROUTINE W REFLEX MICROSCOPIC  ? ? ? ?EKG ? ?My interpretation of EKG: ?Sinus bradycardia rate of 57 without any ST elevation or T  wave inversions, QTc 434 with right bundle branch block ? ? ?RADIOLOGY ?I have reviewed the CT head personally without evidence of intercranial hemorrhage ? ?IMPRESSION: ?1. Small amount of left periorbital soft tissue swelling. No ?underlying displaced facial bone fractures or skull fractures. No ?evidence of significant acute traumatic injury to the brain. ?2. No evidence of significant acute traumatic injury to the cervical ?spine. ?3. Moderate cerebral and cerebellar atrophy with extensive chronic ?microvascular ischemic changes in the cerebral white matter and old ?bilateral basal ganglia lacunar infarcts, as above. ?4. Multilevel degenerative disc disease and cervical spondylosis. ?5. Chronic paranasal sinus disease in the right maxillary sinus ?incidentally noted. ?  ? ? ?PROCEDURES: ? ?Critical Care performed: No ? ?Procedures ? ? ?MEDICATIONS ORDERED IN ED: ?Medications - No data to display ? ? ?IMPRESSION / MDM / ASSESSMENT AND PLAN / ED COURSE  ?I reviewed the triage vital signs and the nursing notes. ? ? ?Patient comes in with  most likely medical fall given her history of dementia and history of recurrent falls previously however EKG and labs were ordered given patient not the best historian.  CT imaging ordered to evaluate for any, intracranial hemorrhage, cervical fracture, facial fracture which were all negative.  I have very low suspicion for rib fracture, hip fracture or extremity fracture given no tenderness on examination. ? ? ?7:12 AM ?Attempted to call POA but no answer.  ? ? ?Patient handed off to oncoming team pending labs and reassessment. ? ? ? ? ?FINAL CLINICAL IMPRESSION(S) / ED DIAGNOSES  ? ?Final diagnoses:  ?Fall, initial encounter  ?Contusion of face, initial encounter  ? ? ? ?Rx / DC Orders  ? ?ED Discharge Orders   ? ? None  ? ?  ? ? ? ?Note:  This document was prepared using Dragon voice recognition software and may include unintentional dictation errors. ?  ?Vanessa [place],  MD ?04/30/22 0818 ? ?

## 2022-04-30 NOTE — ED Triage Notes (Signed)
Pt arrived via [facility] from [facility] post unwitnessed fall where staff found pt on floor with hematoma to the left eye. Pt denies c/o pain. Bruising and swelling noted to left eye. No blood thinner reported.  ?

## 2023-01-07 ENCOUNTER — Emergency Department: Payer: Medicare Other

## 2023-01-07 ENCOUNTER — Emergency Department
Admission: EM | Admit: 2023-01-07 | Discharge: 2023-01-07 | Disposition: A | Discharge status: Home / Self Care | Payer: Medicare Other | Attending: Emergency Medicine | Admitting: Emergency Medicine

## 2023-01-07 DIAGNOSIS — S0990XA Unspecified injury of head, initial encounter: Secondary | ICD-10-CM | POA: Insufficient documentation

## 2023-01-07 DIAGNOSIS — Y9301 Activity, walking, marching and hiking: Secondary | ICD-10-CM | POA: Insufficient documentation

## 2023-01-07 DIAGNOSIS — W19XXXA Unspecified fall, initial encounter: Secondary | ICD-10-CM | POA: Insufficient documentation

## 2023-01-07 NOTE — ED Notes (Signed)
First nurse note: Pt here via [facility] from [facility], pt had mechanical fall while walking, pt hit on double doors, pt has c/o R knee pain, no blood thinner noted.   VSS per EMS

## 2023-01-07 NOTE — ED Provider Notes (Signed)
[facility] Emergency Department Provider Note     Event Date/Time   First MD Initiated Contact with Patient 01/07/23 1803     (approximate)   History   Fall   HPI  Sandra Kaufman is a 89 y.o. female presents to the ED via EMS from [facility].  Patient is in the memory care unit there and sustained a mechanical fall.  She apparently hit her head on the door frame.  She denies any LOC.  Patient is hearing impaired and uses pen and paper to communicate.  Physical Exam   Triage Vital Signs: ED Triage Vitals  Enc Vitals Group     BP 01/07/23 1653 126/62     Pulse --      Resp 01/07/23 1653 17     Temp 01/07/23 1653 98.5 F (36.9 C)     Temp Source 01/07/23 1653 Oral     SpO2 01/07/23 1653 96 %     Weight 01/07/23 1654 154 lb (69.9 kg)     Height --      Head Circumference --      Peak Flow --      Pain Score 01/07/23 1654 0     Pain Loc --      Pain Edu? --      Excl. in [place]? --     Most recent vital signs: Vitals:   01/07/23 1842 01/07/23 2023  BP:  124/60  Pulse: 66 70  Resp:  19  Temp:  98 F (36.7 C)  SpO2:  99%    General Awake, no distress. NAD CV:  Good peripheral perfusion.  RESP:  Normal effort.  ABD:  No distention.  MSK:  Patient with normal active range of motion of the lower extremity.  She transitions from sit to stand with standby assistance and walker use. NEURO: Cranial nerves II to XII grossly intact.  No gross neuromuscular deficits appreciated.  ED Results / Procedures / Treatments   Labs (all labs ordered are listed, but only abnormal results are displayed) Labs Reviewed - No data to display   EKG    RADIOLOGY  I personally viewed and evaluated these images as part of my medical decision making, as well as reviewing the written report by the radiologist.  ED Provider Interpretation: no acute findings  No results found.   PROCEDURES:  Critical Care performed:  No  Procedures   MEDICATIONS ORDERED IN ED: Medications - No data to display   IMPRESSION / MDM / [place] / ED COURSE  I reviewed the triage vital signs and the nursing notes.                              Differential diagnosis includes, but is not limited to, SDH, cervical strain, cervical fracture, cervical radiculopathy  Patient's presentation is most consistent with acute complicated illness / injury requiring diagnostic workup.  Bariatric patient to the ED for evaluation of injury sustained following mechanical fall at her facility.  She is denying complaints in ED, have reassuring dental workup overall.  No acute neuromuscular episode appreciated.  Patient's CT imaging of the cervical spine and head were reviewed by me did not show any acute findings based on my interpretation.  Patient's diagnosis is consistent with minor head injury secondary to mechanical fall. Patient is to follow up with her primary provider as needed or otherwise directed. Patient is given  ED precautions to return to the ED for any worsening or new symptoms.     FINAL CLINICAL IMPRESSION(S) / ED DIAGNOSES   Final diagnoses:  Fall, initial encounter  Minor head injury, initial encounter     Rx / DC Orders   ED Discharge Orders     None        Note:  This document was prepared using Dragon voice recognition software and may include unintentional dictation errors.    Melvenia Needles, PA-C 01/08/23 2359    Vanessa [place], MD 01/10/23 [phone]

## 2023-01-07 NOTE — ED Triage Notes (Signed)
Pt arrived via EMS from [facility]. Pt is in the memory care unit there and fell and hit her head on her room door. Pt is hearing impaired and will need paper and pen to communicate.

## 2023-01-07 NOTE — Discharge Instructions (Addendum)
Your exam and CT scans are normal and reassuring. No evidence of a brain bleed or cervical fracture. You should have an non-emergent outpatient thoracic MRI for evaluation of a likely hemangioma noted on CT.

## 2023-03-30 ENCOUNTER — Encounter: Payer: Self-pay | Admitting: *Deleted

## 2023-03-30 ENCOUNTER — Other Ambulatory Visit: Payer: Self-pay

## 2023-03-30 ENCOUNTER — Emergency Department
Admission: EM | Admit: 2023-03-30 | Discharge: 2023-03-31 | Disposition: A | Discharge status: Skilled Nursing Facility | Payer: Medicare Other | Attending: Emergency Medicine | Admitting: Emergency Medicine

## 2023-03-30 ENCOUNTER — Emergency Department: Payer: Medicare Other

## 2023-03-30 DIAGNOSIS — Y92129 Unspecified place in nursing home as the place of occurrence of the external cause: Secondary | ICD-10-CM | POA: Insufficient documentation

## 2023-03-30 DIAGNOSIS — I10 Essential (primary) hypertension: Secondary | ICD-10-CM | POA: Insufficient documentation

## 2023-03-30 DIAGNOSIS — M25552 Pain in left hip: Secondary | ICD-10-CM | POA: Insufficient documentation

## 2023-03-30 DIAGNOSIS — W19XXXA Unspecified fall, initial encounter: Secondary | ICD-10-CM | POA: Diagnosis not present

## 2023-03-30 DIAGNOSIS — J449 Chronic obstructive pulmonary disease, unspecified: Secondary | ICD-10-CM | POA: Insufficient documentation

## 2023-03-30 DIAGNOSIS — M25551 Pain in right hip: Secondary | ICD-10-CM | POA: Diagnosis not present

## 2023-03-30 NOTE — ED Triage Notes (Signed)
Pt brought in via ems from [facility].  Pt was stumbling and fell per ems.  Pt has left hip pain.   Pt hard of hearing.  Pt on a stretcher in triage.

## 2023-03-30 NOTE — ED Provider Notes (Signed)
   [facility] Provider Note    Event Date/Time   First MD Initiated Contact with Patient 03/30/23 2146     (approximate)  History   Chief Complaint: Fall  HPI  Sandra Kaufman is a 89 y.o. female with a past medical history of COPD, gastric reflux, hypertension, frequent falls presents from her nursing facility after a fall.  According to report patient lives at [facility] memory care center where she had a witnessed fall stumbling forward landing onto her legs.  Per report did not hit her head no LOC.  Patient was complaining of hip pain so they sent her to the emergency department for evaluation.  Physical Exam   Triage Vital Signs: ED Triage Vitals [03/30/23 2030]  Enc Vitals Group     BP 132/85     Pulse Rate (!) 109     Resp 20     Temp 98.3 F (36.8 C)     Temp Source Oral     SpO2 95 %     Weight 152 lb 1.9 oz (69 kg)     Height 5\' 3"  (1.6 m)     Head Circumference      Peak Flow      Pain Score 5     Pain Loc      Pain Edu?      Excl. in [place]?     Most recent vital signs: Vitals:   03/30/23 2030  BP: 132/85  Pulse: (!) 109  Resp: 20  Temp: 98.3 F (36.8 C)  SpO2: 95%    General: Awake, no distress.  No signs of head trauma, no hematoma. CV:  Good peripheral perfusion.  Regular rate and rhythm  Resp:  Normal effort.  Equal breath sounds bilaterally.  Abd:  No distention.  Soft, nontender.  No rebound or guarding. Other:  Good range of motion of bilateral lower extremities, no apparent tenderness to hip palpation.   ED Results / Procedures / Treatments    RADIOLOGY  I have reviewed the patient's pelvis and hip x-rays.  Patient is status post left hip replacement, no fracture seen on my evaluation of the images. Radiology is read the x-ray is negative for acute fracture.  MEDICATIONS ORDERED IN ED: Medications - No data to display   IMPRESSION / MDM / [place] / ED COURSE  I reviewed the triage vital signs  and the nursing notes.  Patient's presentation is most consistent with acute presentation with potential threat to life or bodily function.  Patient presents emergency department from her memory care nursing facility after a witnessed fall was complaining of some hip pain.  X-rays are negative for fracture.  Patient has good range of motion in the emergency department, no apparent tenderness to hip palpation.  No other signs of trauma such as hematoma or head injuries.  Will discharge patient back to her nursing facility.  FINAL CLINICAL IMPRESSION(S) / ED DIAGNOSES   Fall   Note:  This document was prepared using Dragon voice recognition software and may include unintentional dictation errors.   Harvest Dark, MD 03/30/23 2213

## 2023-03-30 NOTE — ED Notes (Signed)
Report given to April, RN

## 2023-03-30 NOTE — ED Notes (Signed)
Patient's caregiver Lenna Sciara [phone] wants an update when available.

## 2023-03-30 NOTE — Discharge Instructions (Addendum)
You have been seen in the emergency department after a fall.  Your workup does not show any concerning findings or fractures.  Please follow-up with your doctor.

## 2023-03-30 NOTE — ED Notes (Signed)
Secretary made aware to call transport for patient.

## 2023-03-30 NOTE — ED Triage Notes (Signed)
EMS brings pt in from [facility] memory care for witnessed fall after stumbling; lying on rt hip when found but c/o left hip pain

## 2023-03-31 NOTE — ED Notes (Signed)
Pt resting, continue to wait for ems transport back to facility

## 2023-03-31 NOTE — ED Notes (Signed)
Waiting on EMS transport back to facility.

## 2023-05-01 IMAGING — CT CT HEAD W/O CM
3 of 4 series · 13 of 47 positions shown, 15 images · non-contrast
Comparison: 06/11/2021

CLINICAL DATA: Fall, head and neck pain

EXAM:
CT HEAD WITHOUT CONTRAST
CT CERVICAL SPINE WITHOUT CONTRAST
TECHNIQUE: Multidetector CT imaging of the head and cervical spine was
performed following the standard protocol without intravenous
contrast. Multiplanar CT image reconstructions of the cervical spine
were also generated.

[Series 3: ax head wo · axial · 0.33mm/px · z∈[+567,+671]mm · 7 of 29 slices shown, 9 images]
[im 4/29  brain]
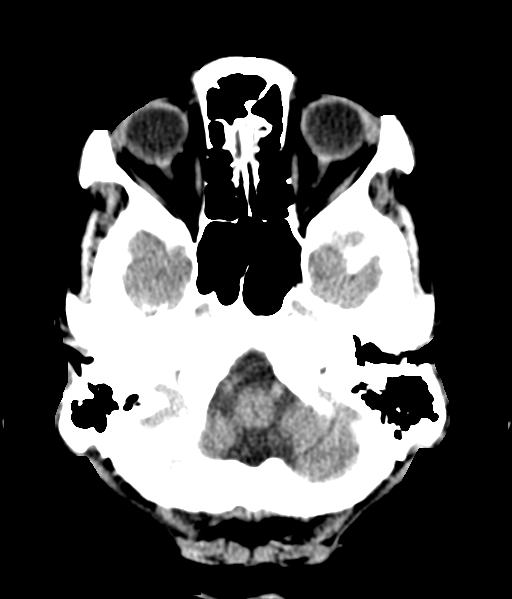
[im 4/29  bone]
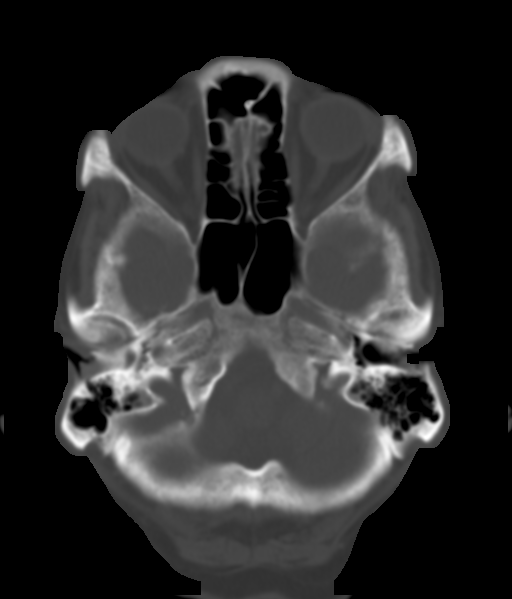
[im 8/29  brain]
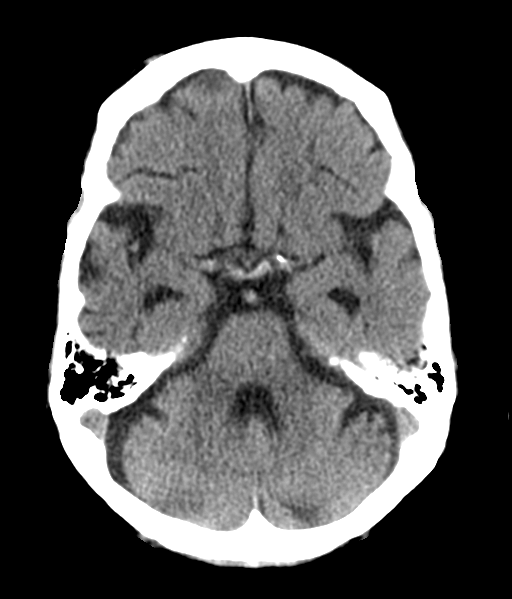
[im 11/29  brain]
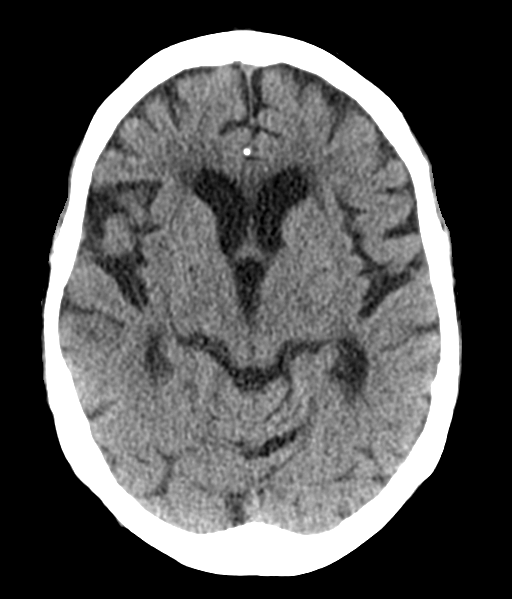
[im 15/29  brain]
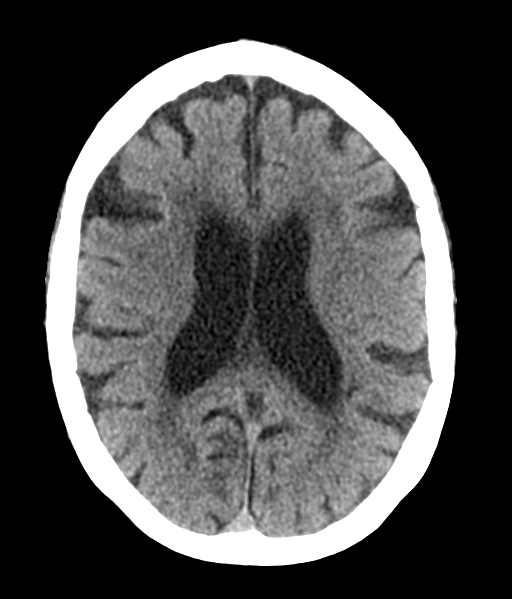
[im 18/29  brain]
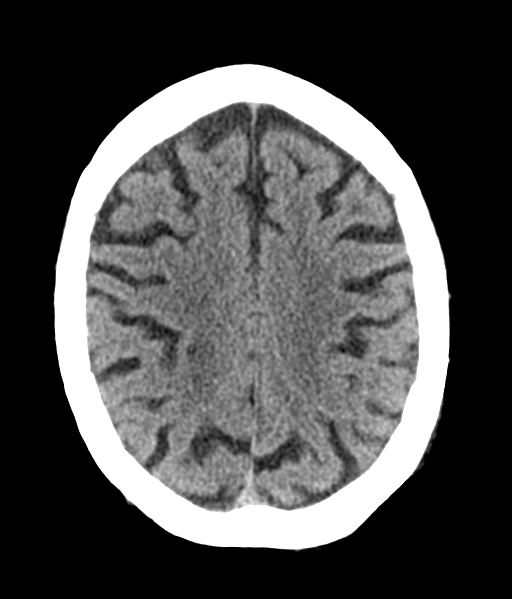
[im 18/29  bone]
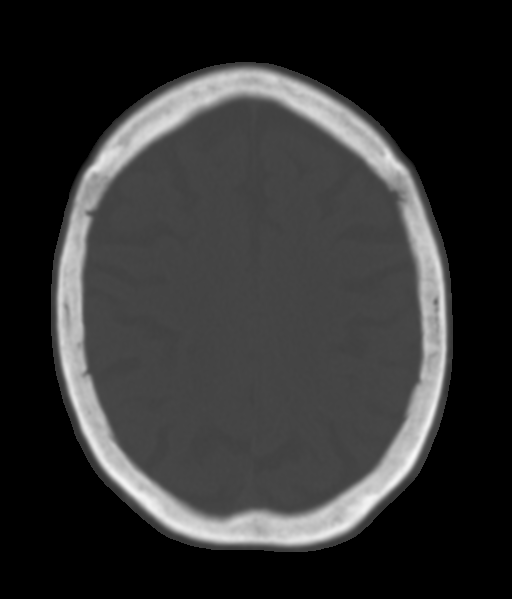
[im 22/29  brain]
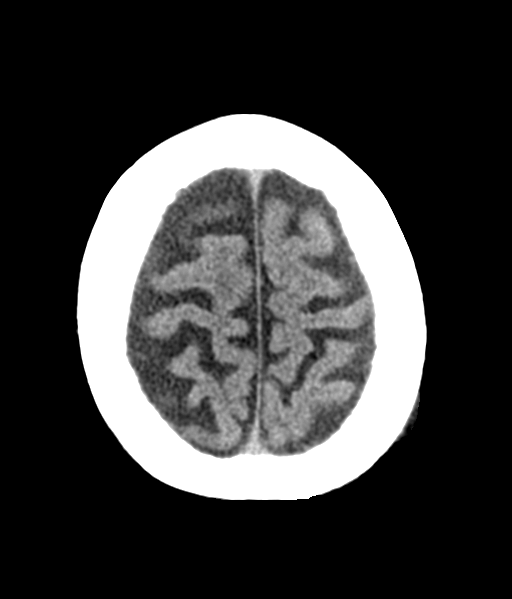
[im 25/29  brain]
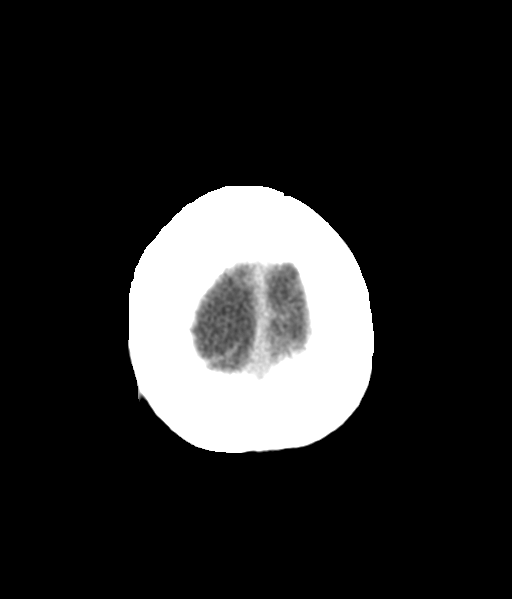

[Series 4: coronal soft tissue · coronal · 0.28mm/px · 3 of 60 slices shown]
[im 20/60  brain]
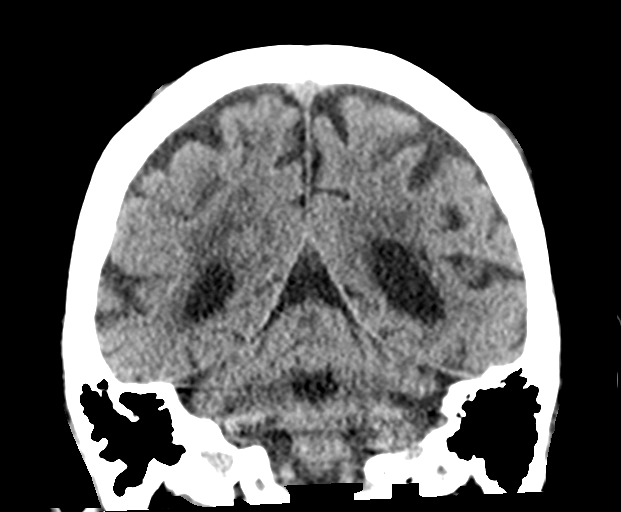
[im 27/60  brain]
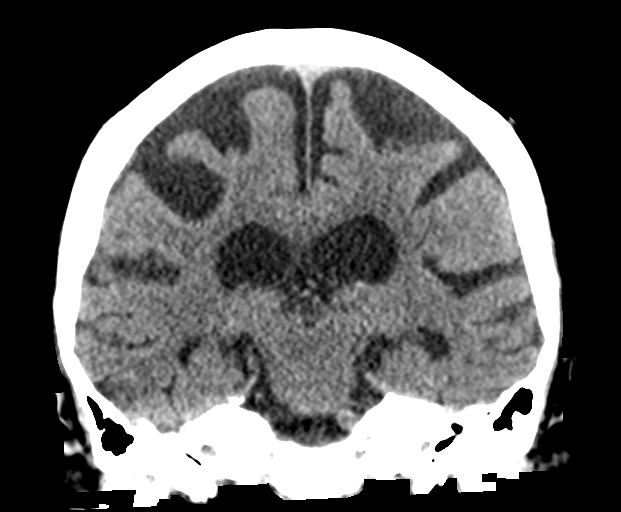
[im 33/60  brain]
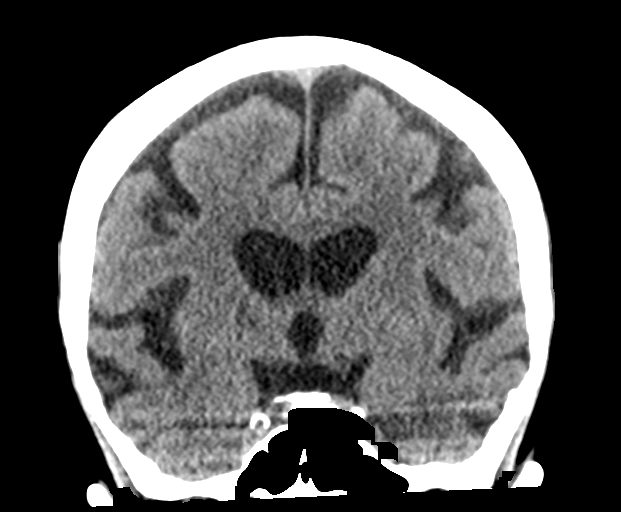

[Series 5: sagittal soft tissue · sagittal · 0.30mm/px · 3 of 47 slices shown]
[im 16/47  brain]
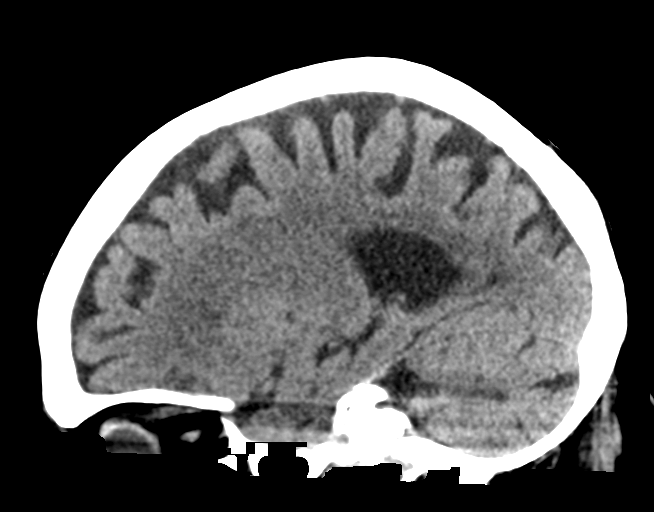
[im 24/47  brain]
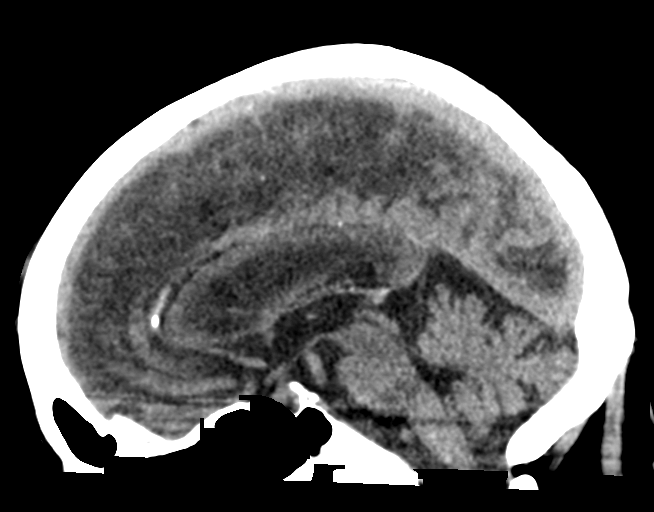
[im 31/47  brain]
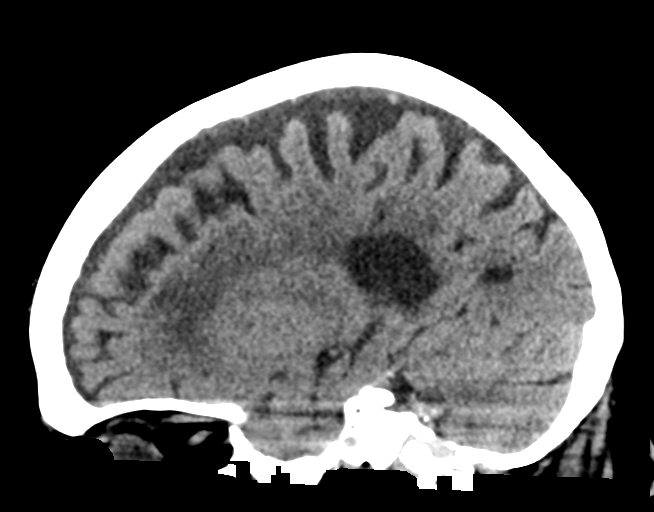

[13 of 47 positions shown; findings below may reference images not displayed]

FINDINGS: CT HEAD FINDINGS

Brain: No evidence of acute infarction, hemorrhage, hydrocephalus,
extra-axial collection or mass lesion/mass effect. Mild
periventricular and deep white matter hypodensity. Mild global
cerebral volume loss.

Vascular: No hyperdense vessel or unexpected calcification.

Skull: Normal. Negative for fracture or focal lesion.

Sinuses/Orbits: No acute finding.

Other: Soft tissue contusion over the left parietal vertex (series
3, image 19).

CT CERVICAL SPINE FINDINGS

Alignment: Degenerative straightening of the normal cervical
lordosis.

Skull base and vertebrae: No acute fracture. No primary bone lesion
or focal pathologic process.

Soft tissues and spinal canal: No prevertebral fluid or swelling. No
visible canal hematoma.

Disc levels: Moderate to severe multilevel disc space height loss
and osteophytosis throughout the cervical spine.

Upper chest: Negative.

Other: None.
IMPRESSION: 1. No acute intracranial pathology. Small-vessel white matter
disease and cerebral volume loss in keeping with advanced patient
age.
2. Soft tissue contusion over the left parietal vertex.
3. No fracture or static subluxation of the cervical spine.
4. Moderate to severe multilevel cervical disc degenerative disease.

## 2023-05-01 IMAGING — CT CT CERVICAL SPINE W/O CM
3 of 4 series · 9 of 33 positions shown, 10 images · non-contrast
Comparison: 06/11/2021

CLINICAL DATA: Fall, head and neck pain

EXAM:
CT HEAD WITHOUT CONTRAST
CT CERVICAL SPINE WITHOUT CONTRAST
TECHNIQUE: Multidetector CT imaging of the head and cervical spine was
performed following the standard protocol without intravenous
contrast. Multiplanar CT image reconstructions of the cervical spine
were also generated.

[Series 6: sagittal bone · sagittal · 0.19mm/px · 5 of 33 slices shown]
[im 11/33  bone]
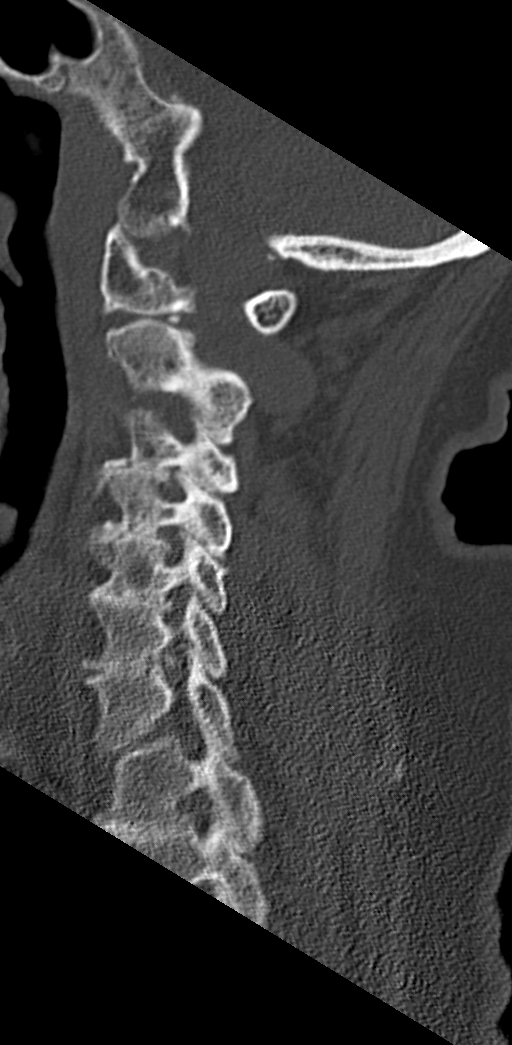
[im 14/33  bone]
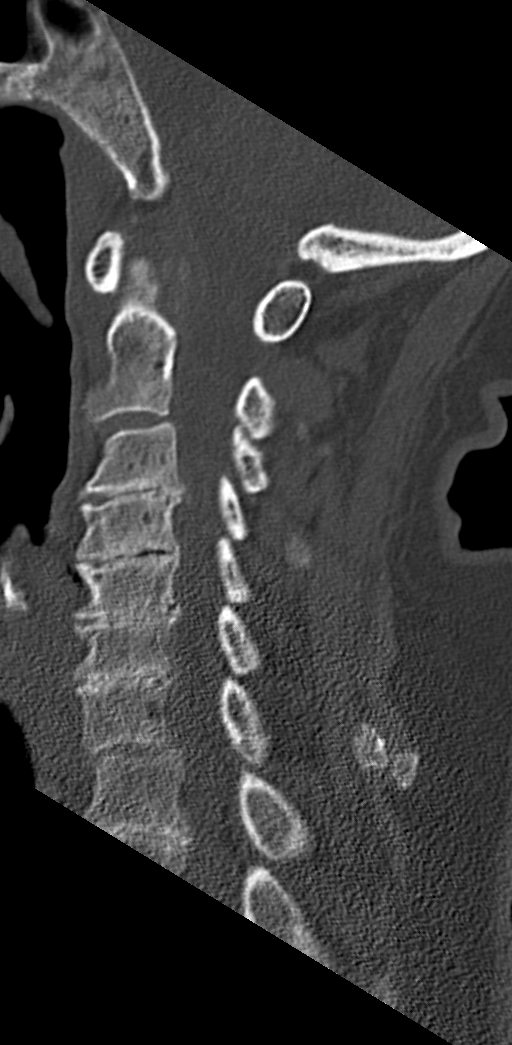
[im 17/33  bone]
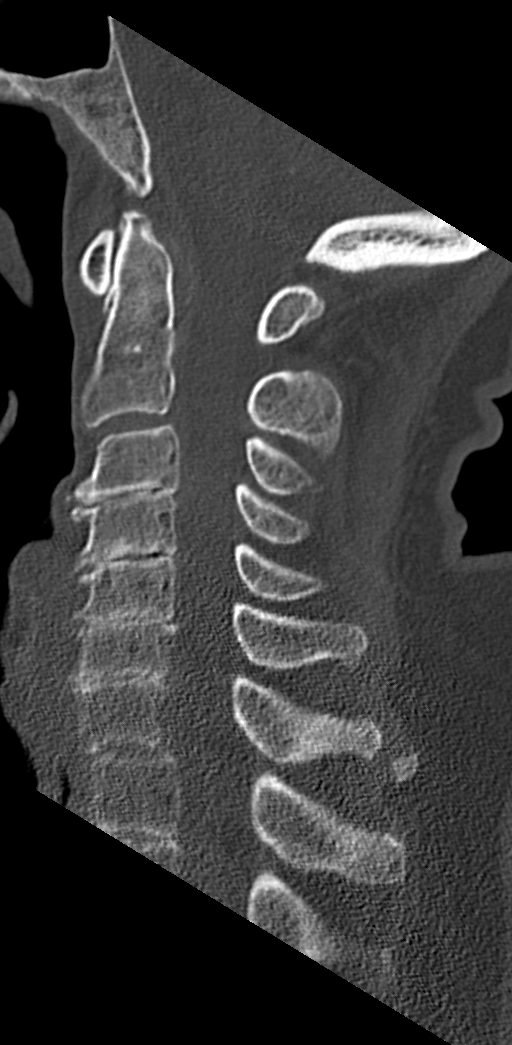
[im 19/33  bone]
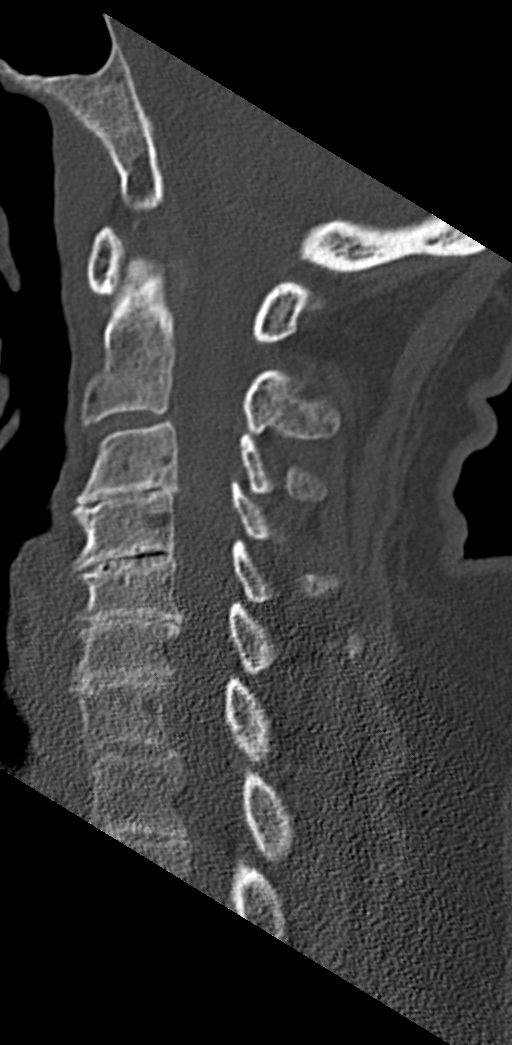
[im 22/33  bone]
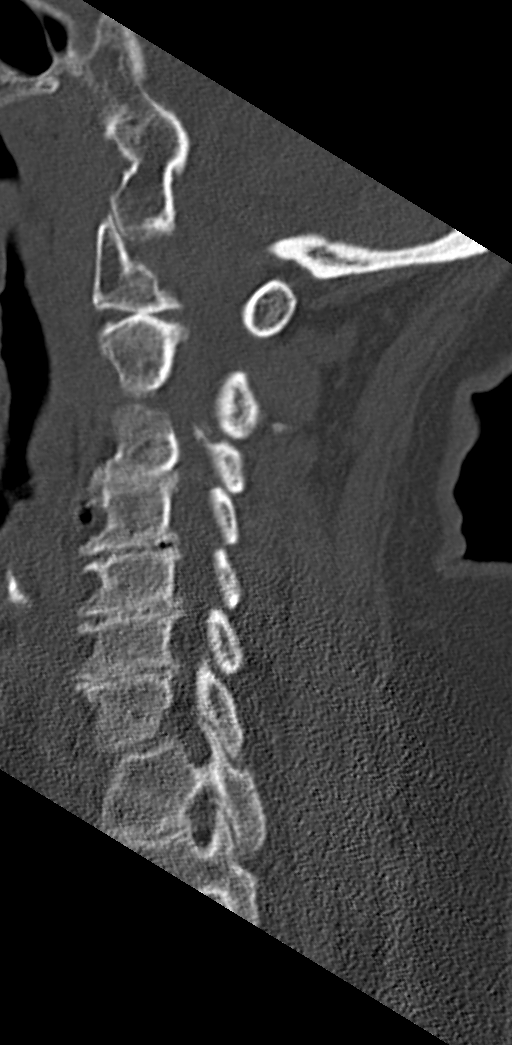

[Series 7: coronal bone · coronal · 0.17mm/px · 3 of 28 slices shown]
[im 6/28  bone]
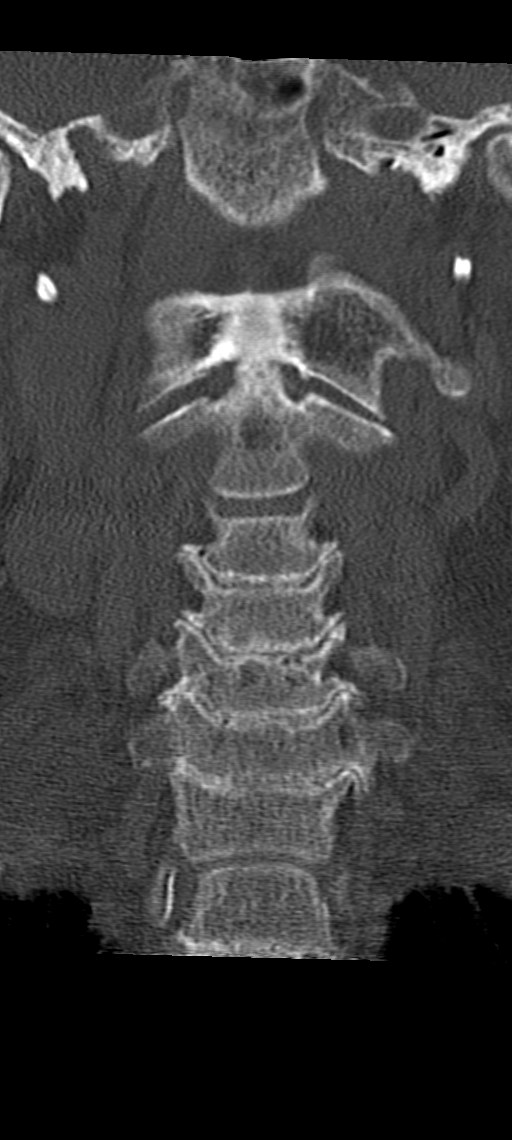
[im 11/28  bone]
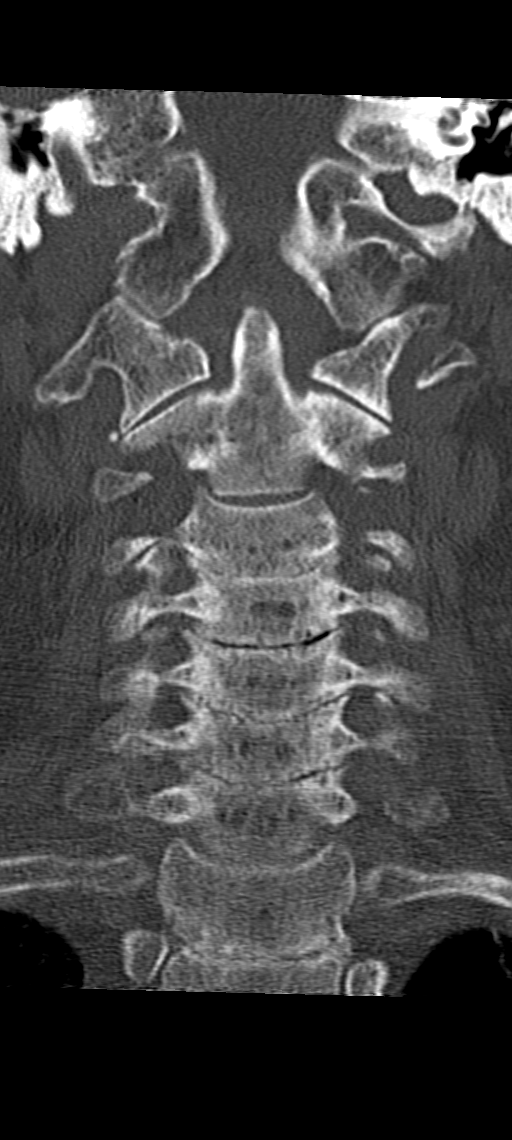
[im 17/28  bone]
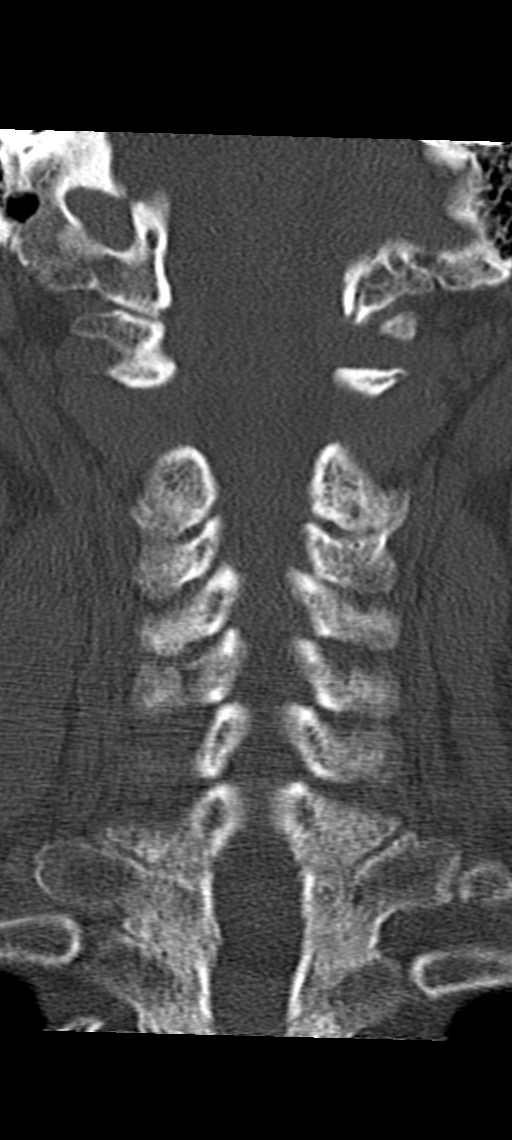

[Series 8: orthogonal bone · axial · 0.19mm/px · z∈[+492,+492]mm · 1 of 79 slices shown, 2 images]
[im 45/79  soft-tissue]
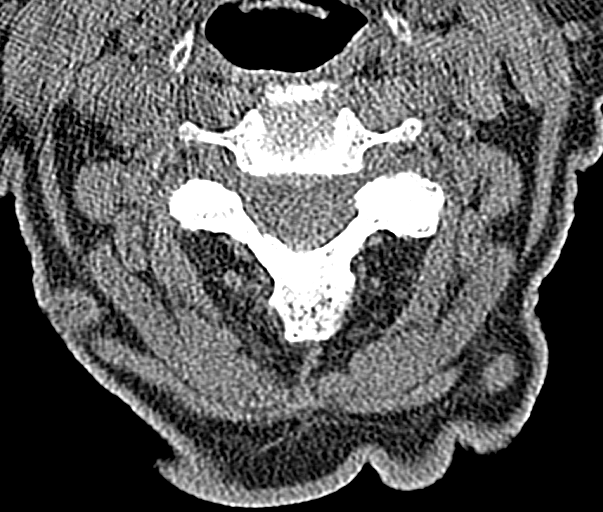
[im 45/79  bone]
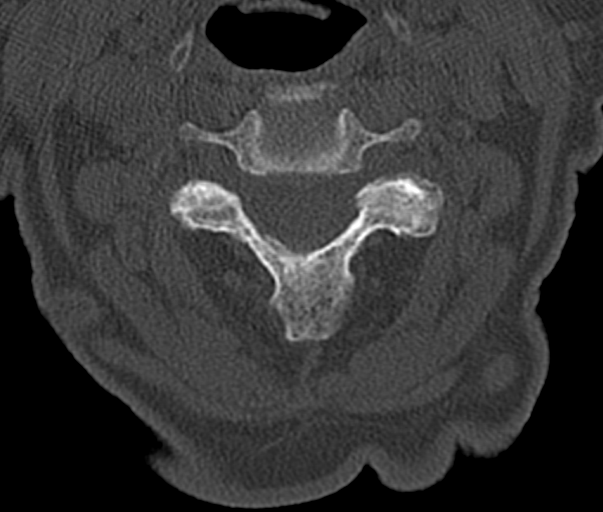

[9 of 33 positions shown; findings below may reference images not displayed]

FINDINGS: CT HEAD FINDINGS

Brain: No evidence of acute infarction, hemorrhage, hydrocephalus,
extra-axial collection or mass lesion/mass effect. Mild
periventricular and deep white matter hypodensity. Mild global
cerebral volume loss.

Vascular: No hyperdense vessel or unexpected calcification.

Skull: Normal. Negative for fracture or focal lesion.

Sinuses/Orbits: No acute finding.

Other: Soft tissue contusion over the left parietal vertex (series
3, image 19).

CT CERVICAL SPINE FINDINGS

Alignment: Degenerative straightening of the normal cervical
lordosis.

Skull base and vertebrae: No acute fracture. No primary bone lesion
or focal pathologic process.

Soft tissues and spinal canal: No prevertebral fluid or swelling. No
visible canal hematoma.

Disc levels: Moderate to severe multilevel disc space height loss
and osteophytosis throughout the cervical spine.

Upper chest: Negative.

Other: None.
IMPRESSION: 1. No acute intracranial pathology. Small-vessel white matter
disease and cerebral volume loss in keeping with advanced patient
age.
2. Soft tissue contusion over the left parietal vertex.
3. No fracture or static subluxation of the cervical spine.
4. Moderate to severe multilevel cervical disc degenerative disease.

## 2023-12-02 ENCOUNTER — Encounter: Payer: Self-pay | Admitting: Emergency Medicine

## 2023-12-02 ENCOUNTER — Emergency Department: Payer: Medicare Other

## 2023-12-02 ENCOUNTER — Other Ambulatory Visit: Payer: Self-pay

## 2023-12-02 ENCOUNTER — Inpatient Hospital Stay: Payer: Medicare Other

## 2023-12-02 ENCOUNTER — Inpatient Hospital Stay
Admission: EM | Admit: 2023-12-02 | Discharge: 2023-12-09 | DRG: 603 | Disposition: A | Discharge status: Skilled Nursing Facility | Payer: Medicare Other | Source: Skilled Nursing Facility | Attending: Internal Medicine | Admitting: Internal Medicine

## 2023-12-02 DIAGNOSIS — Z66 Do not resuscitate: Secondary | ICD-10-CM | POA: Diagnosis present

## 2023-12-02 DIAGNOSIS — E876 Hypokalemia: Secondary | ICD-10-CM | POA: Diagnosis not present

## 2023-12-02 DIAGNOSIS — Z833 Family history of diabetes mellitus: Secondary | ICD-10-CM | POA: Diagnosis not present

## 2023-12-02 DIAGNOSIS — S81802A Unspecified open wound, left lower leg, initial encounter: Secondary | ICD-10-CM | POA: Diagnosis present

## 2023-12-02 DIAGNOSIS — E7800 Pure hypercholesterolemia, unspecified: Secondary | ICD-10-CM | POA: Diagnosis present

## 2023-12-02 DIAGNOSIS — Z820 Family history of epilepsy and other diseases of the nervous system: Secondary | ICD-10-CM | POA: Diagnosis not present

## 2023-12-02 DIAGNOSIS — Z885 Allergy status to narcotic agent status: Secondary | ICD-10-CM

## 2023-12-02 DIAGNOSIS — S82433A Displaced oblique fracture of shaft of unspecified fibula, initial encounter for closed fracture: Secondary | ICD-10-CM | POA: Diagnosis present

## 2023-12-02 DIAGNOSIS — L03116 Cellulitis of left lower limb: Secondary | ICD-10-CM | POA: Diagnosis not present

## 2023-12-02 DIAGNOSIS — J449 Chronic obstructive pulmonary disease, unspecified: Secondary | ICD-10-CM | POA: Diagnosis present

## 2023-12-02 DIAGNOSIS — Y929 Unspecified place or not applicable: Secondary | ICD-10-CM

## 2023-12-02 DIAGNOSIS — S82852A Displaced trimalleolar fracture of left lower leg, initial encounter for closed fracture: Secondary | ICD-10-CM | POA: Diagnosis present

## 2023-12-02 DIAGNOSIS — Z9841 Cataract extraction status, right eye: Secondary | ICD-10-CM

## 2023-12-02 DIAGNOSIS — S8252XA Displaced fracture of medial malleolus of left tibia, initial encounter for closed fracture: Secondary | ICD-10-CM | POA: Diagnosis not present

## 2023-12-02 DIAGNOSIS — E785 Hyperlipidemia, unspecified: Secondary | ICD-10-CM | POA: Diagnosis not present

## 2023-12-02 DIAGNOSIS — E119 Type 2 diabetes mellitus without complications: Secondary | ICD-10-CM

## 2023-12-02 DIAGNOSIS — K219 Gastro-esophageal reflux disease without esophagitis: Secondary | ICD-10-CM | POA: Diagnosis present

## 2023-12-02 DIAGNOSIS — Z8249 Family history of ischemic heart disease and other diseases of the circulatory system: Secondary | ICD-10-CM | POA: Diagnosis not present

## 2023-12-02 DIAGNOSIS — S82839A Other fracture of upper and lower end of unspecified fibula, initial encounter for closed fracture: Secondary | ICD-10-CM

## 2023-12-02 DIAGNOSIS — Z9842 Cataract extraction status, left eye: Secondary | ICD-10-CM

## 2023-12-02 DIAGNOSIS — E878 Other disorders of electrolyte and fluid balance, not elsewhere classified: Secondary | ICD-10-CM | POA: Insufficient documentation

## 2023-12-02 DIAGNOSIS — R627 Adult failure to thrive: Secondary | ICD-10-CM | POA: Diagnosis present

## 2023-12-02 DIAGNOSIS — E039 Hypothyroidism, unspecified: Secondary | ICD-10-CM | POA: Diagnosis present

## 2023-12-02 DIAGNOSIS — E1169 Type 2 diabetes mellitus with other specified complication: Secondary | ICD-10-CM | POA: Insufficient documentation

## 2023-12-02 DIAGNOSIS — X58XXXA Exposure to other specified factors, initial encounter: Secondary | ICD-10-CM | POA: Diagnosis present

## 2023-12-02 DIAGNOSIS — L0390 Cellulitis, unspecified: Secondary | ICD-10-CM | POA: Diagnosis present

## 2023-12-02 DIAGNOSIS — Z96642 Presence of left artificial hip joint: Secondary | ICD-10-CM | POA: Diagnosis present

## 2023-12-02 DIAGNOSIS — Z8673 Personal history of transient ischemic attack (TIA), and cerebral infarction without residual deficits: Secondary | ICD-10-CM | POA: Diagnosis not present

## 2023-12-02 DIAGNOSIS — Z794 Long term (current) use of insulin: Secondary | ICD-10-CM

## 2023-12-02 DIAGNOSIS — R296 Repeated falls: Secondary | ICD-10-CM | POA: Diagnosis present

## 2023-12-02 DIAGNOSIS — R7982 Elevated C-reactive protein (CRP): Secondary | ICD-10-CM | POA: Diagnosis present

## 2023-12-02 DIAGNOSIS — Z79890 Hormone replacement therapy (postmenopausal): Secondary | ICD-10-CM

## 2023-12-02 DIAGNOSIS — Z7982 Long term (current) use of aspirin: Secondary | ICD-10-CM

## 2023-12-02 DIAGNOSIS — I10 Essential (primary) hypertension: Secondary | ICD-10-CM | POA: Diagnosis present

## 2023-12-02 DIAGNOSIS — E1142 Type 2 diabetes mellitus with diabetic polyneuropathy: Secondary | ICD-10-CM | POA: Diagnosis present

## 2023-12-02 DIAGNOSIS — F03C0 Unspecified dementia, severe, without behavioral disturbance, psychotic disturbance, mood disturbance, and anxiety: Secondary | ICD-10-CM | POA: Diagnosis not present

## 2023-12-02 DIAGNOSIS — F0390 Unspecified dementia without behavioral disturbance: Secondary | ICD-10-CM | POA: Diagnosis present

## 2023-12-02 DIAGNOSIS — G309 Alzheimer's disease, unspecified: Secondary | ICD-10-CM | POA: Diagnosis not present

## 2023-12-02 DIAGNOSIS — F02C0 Dementia in other diseases classified elsewhere, severe, without behavioral disturbance, psychotic disturbance, mood disturbance, and anxiety: Secondary | ICD-10-CM | POA: Diagnosis not present

## 2023-12-02 DIAGNOSIS — Z79899 Other long term (current) drug therapy: Secondary | ICD-10-CM

## 2023-12-02 DIAGNOSIS — R5381 Other malaise: Secondary | ICD-10-CM | POA: Diagnosis present

## 2023-12-02 DIAGNOSIS — Z85828 Personal history of other malignant neoplasm of skin: Secondary | ICD-10-CM

## 2023-12-02 LAB — CBC
HCT: 29.6 % — ABNORMAL LOW (ref 36.0–46.0)
HCT: 26.6 % — ABNORMAL LOW (ref 36.0–46.0)
Hemoglobin: 9.7 g/dL — ABNORMAL LOW (ref 12.0–15.0)
Hemoglobin: 8.8 g/dL — ABNORMAL LOW (ref 12.0–15.0)
MCH: 30.2 pg (ref 26.0–34.0)
MCH: 30.8 pg (ref 26.0–34.0)
MCHC: 32.8 g/dL (ref 30.0–36.0)
MCHC: 33.1 g/dL (ref 30.0–36.0)
MCV: 92.2 fL (ref 80.0–100.0)
MCV: 93 fL (ref 80.0–100.0)
Platelets: 297 10*3/uL (ref 150–400)
Platelets: 231 10*3/uL (ref 150–400)
RBC: 3.21 MIL/uL — ABNORMAL LOW (ref 3.87–5.11)
RBC: 2.86 MIL/uL — ABNORMAL LOW (ref 3.87–5.11)
RDW: 13.6 % (ref 11.5–15.5)
RDW: 13.6 % (ref 11.5–15.5)
WBC: 9.5 10*3/uL (ref 4.0–10.5)
WBC: 7.7 10*3/uL (ref 4.0–10.5)
nRBC: 0 % (ref 0.0–0.2)
nRBC: 0.3 % — ABNORMAL HIGH (ref 0.0–0.2)

## 2023-12-02 LAB — BASIC METABOLIC PANEL
Anion gap: 8 (ref 5–15)
BUN: 16 mg/dL (ref 8–23)
CO2: 26 mmol/L (ref 22–32)
Calcium: 8.4 mg/dL — ABNORMAL LOW (ref 8.9–10.3)
Chloride: 101 mmol/L (ref 98–111)
Creatinine, Ser: 0.92 mg/dL (ref 0.44–1.00)
GFR, Estimated: 60 mL/min — ABNORMAL LOW (ref >60)
Glucose, Bld: 174 mg/dL — ABNORMAL HIGH (ref 70–99)
Potassium: 4 mmol/L (ref 3.5–5.1)
Sodium: 135 mmol/L (ref 135–145)

## 2023-12-02 LAB — SEDIMENTATION RATE: Sed Rate: 75 mm/h — ABNORMAL HIGH (ref 0–30)

## 2023-12-02 LAB — HEMOGLOBIN A1C
Hgb A1c MFr Bld: 6.7 % — ABNORMAL HIGH (ref 4.8–5.6)
Mean Plasma Glucose: 145.59 mg/dL

## 2023-12-02 LAB — CBG MONITORING, ED
Glucose-Capillary: 152 mg/dL — ABNORMAL HIGH (ref 70–99)
Glucose-Capillary: 97 mg/dL (ref 70–99)

## 2023-12-02 LAB — C-REACTIVE PROTEIN: CRP: 6 mg/dL — ABNORMAL HIGH (ref <1.0)

## 2023-12-02 LAB — PREALBUMIN: Prealbumin: 13 mg/dL — ABNORMAL LOW (ref 18–38)

## 2023-12-02 MED ORDER — VANCOMYCIN HCL 1500 MG/300ML IV SOLN
1500.0000 mg | Freq: Once | INTRAVENOUS | Status: DC
  Filled 2023-12-02: qty 300

## 2023-12-02 MED ORDER — VANCOMYCIN HCL 1.5 G IV SOLR
1500.0000 mg | Freq: Once | INTRAVENOUS | Status: AC
  Administered 2023-12-02: 1500 mg via INTRAVENOUS
  Filled 2023-12-02: qty 30

## 2023-12-02 MED ORDER — ENOXAPARIN SODIUM 40 MG/0.4ML IJ SOSY
40.0000 mg | PREFILLED_SYRINGE | INTRAMUSCULAR | Status: DC
Start: 2023-12-02 — End: 2023-12-09
  Administered 2023-12-04 – 2023-12-08 (×6): 40 mg via SUBCUTANEOUS
  Filled 2023-12-02 (×6): qty 0.4

## 2023-12-02 MED ORDER — SODIUM CHLORIDE 0.9 % IV SOLN
2.0000 g | Freq: Two times a day (BID) | INTRAVENOUS | Status: DC
  Administered 2023-12-02 – 2023-12-04 (×4): 2 g via INTRAVENOUS
  Filled 2023-12-02 (×4): qty 12.5

## 2023-12-02 MED ORDER — ONDANSETRON HCL 4 MG PO TABS: 4.0000 mg | ORAL_TABLET | Freq: Four times a day (QID) | ORAL | Status: DC | PRN

## 2023-12-02 MED ORDER — ONDANSETRON HCL 4 MG/2ML IJ SOLN: 4.0000 mg | Freq: Four times a day (QID) | INTRAMUSCULAR | Status: DC | PRN

## 2023-12-02 MED ORDER — MORPHINE SULFATE (PF) 2 MG/ML IV SOLN
2.0000 mg | INTRAVENOUS | Status: DC | PRN
  Administered 2023-12-02 – 2023-12-03 (×5): 2 mg via INTRAVENOUS
  Filled 2023-12-02 (×5): qty 1

## 2023-12-02 MED ORDER — PANTOPRAZOLE SODIUM 40 MG PO TBEC
40.0000 mg | DELAYED_RELEASE_TABLET | Freq: Every day | ORAL | Status: DC
  Administered 2023-12-02 – 2023-12-08 (×6): 40 mg via ORAL
  Filled 2023-12-02 (×8): qty 1

## 2023-12-02 MED ORDER — SODIUM CHLORIDE 0.9 % IV SOLN
1.0000 g | INTRAVENOUS | Status: DC
  Administered 2023-12-02: 1 g via INTRAVENOUS
  Filled 2023-12-02: qty 10

## 2023-12-02 MED ORDER — METRONIDAZOLE 500 MG/100ML IV SOLN
500.0000 mg | Freq: Three times a day (TID) | INTRAVENOUS | Status: DC
  Administered 2023-12-02 – 2023-12-04 (×5): 500 mg via INTRAVENOUS
  Filled 2023-12-02 (×6): qty 100

## 2023-12-02 MED ORDER — SODIUM CHLORIDE 0.9 % IV SOLN: INTRAVENOUS | Status: AC

## 2023-12-02 MED ORDER — INSULIN ASPART 100 UNIT/ML IJ SOLN
0.0000 [IU] | INTRAMUSCULAR | Status: DC
  Administered 2023-12-02: 2 [IU] via SUBCUTANEOUS
  Administered 2023-12-03 (×3): 1 [IU] via SUBCUTANEOUS
  Filled 2023-12-02 (×3): qty 1

## 2023-12-02 NOTE — ED Provider Notes (Signed)
[facility] Provider Note    Event Date/Time   First MD Initiated Contact with Patient 12/02/23 1235     (approximate)   History   Cellulitis   HPI  Sandra Kaufman is a 89 y.o. female with a past medical history of dementia, falls, diabetes, hypertension who presents today for evaluation of left lower extremity bruising and erythema.  Patient came from [facility] memory care unit via EMS.  Patient is unable to participate in history given her dementia, but per EMS patient is at baseline.  Patient Active Problem List   Diagnosis Date Noted   Ileus (HCC) 08/27/2016   Multiple falls 07/22/2016   Dementia (HCC) 04/22/2016   Fracture of femoral neck, left (HCC) 04/16/2016   Closed fracture of neck of left femur, initial encounter (HCC) 04/16/2016   Vomiting 03/17/2016   Rash 03/17/2016   Diarrhea 02/18/2016   Adult hypothyroidism 01/29/2016   Fall 01/19/2016   Anemia 10/20/2015   Headache, migraine 10/10/2015   HLD (hyperlipidemia) 10/10/2015   Glaucoma 10/10/2015   Chronic obstructive pulmonary disease (HCC) 10/10/2015   Arthritis 10/10/2015   Hypertrophic toenail 07/23/2015   Health care maintenance 05/27/2015   Difficulty in walking 10/04/2014   Cerebral ataxia (HCC) 10/04/2014   Unsteady gait 08/22/2014   Obesity 08/22/2014   Dysphagia 07/14/2014   Amnesia 05/24/2014   B12 deficiency 05/24/2014   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   SOB (shortness of breath) 05/06/2014   CVA (cerebral vascular accident) (HCC) 03/04/2014   Diverticulosis 04/29/2013   GERD (gastroesophageal reflux disease) 11/27/2012   Urinary incontinence 11/27/2012   Ovarian cyst 11/27/2012   Depression 11/27/2012   Neuropathy 11/27/2012   Hypothyroidism 11/15/2012   Hypercholesterolemia 11/15/2012   Diabetes mellitus (HCC) 11/15/2012   Hypertension 11/15/2012          Physical Exam   Triage Vital Signs: ED Triage Vitals [12/02/23 1111]  Encounter Vitals  Group     BP (!) 114/90     Systolic BP Percentile      Diastolic BP Percentile      Pulse Rate 92     Resp 16     Temp 98.7 F (37.1 C)     Temp Source Oral     SpO2 100 %     Weight      Height      Head Circumference      Peak Flow      Pain Score      Pain Loc      Pain Education      Exclude from Growth Chart     Most recent vital signs: Vitals:   12/02/23 1111  BP: (!) 114/90  Pulse: 92  Resp: 16  Temp: 98.7 F (37.1 C)  SpO2: 100%    Physical Exam Vitals and nursing note reviewed.  Constitutional:      General: Awake and alert. No acute distress.    Appearance: Normal appearance. The patient is normal weight.  HENT:     Head: Normocephalic and atraumatic.     Mouth: Mucous membranes are moist.  Eyes:     General: PERRL. Normal EOMs        Right eye: No discharge.        Left eye: No discharge.     Conjunctiva/sclera: Conjunctivae normal.  Cardiovascular:     Rate and Rhythm: Normal rate and regular rhythm.     Pulses: Normal pulses.  Pulmonary:     Effort: Pulmonary  effort is normal. No respiratory distress.     Breath sounds: Normal breath sounds.  Abdominal:     Abdomen is soft. There is no abdominal tenderness. No rebound or guarding. No distention. Musculoskeletal:        General: No swelling. Normal range of motion.     Cervical back: Normal range of motion and neck supple.  Left lower extremity: Ecchymosis noted to anterior shin with scattered superficial blisters, and 1 open wound to her anterior lower shin with surrounding erythema.  No lymphangitis.  No crepitus.  Normal pedal pulses.  Foot is warm and well-perfused.  No focal area of fluctuance.  Normal range of motion of ankle and knee. Skin:    General: Skin is warm and dry.     Capillary Refill: Capillary refill takes less than 2 seconds.     Findings: No rash.  Neurological:     Mental Status: The patient is awake and alert.       ED Results / Procedures / Treatments    Labs (all labs ordered are listed, but only abnormal results are displayed) Labs Reviewed  BASIC METABOLIC PANEL - Abnormal; Notable for the following components:      Result Value   Glucose, Bld 174 (*)    Calcium 8.4 (*)    GFR, Estimated 60 (*)    All other components within normal limits  CBC - Abnormal; Notable for the following components:   RBC 3.21 (*)    Hemoglobin 9.7 (*)    HCT 29.6 (*)    All other components within normal limits     EKG     RADIOLOGY I independently reviewed and interpreted imaging and agree with radiologists findings.     PROCEDURES:  Critical Care performed:   Procedures   MEDICATIONS ORDERED IN ED: Medications  cefTRIAXone (ROCEPHIN) 1 g in sodium chloride 0.9 % 100 mL IVPB (0 g Intravenous Stopped 12/02/23 1358)     IMPRESSION / MDM / ASSESSMENT AND PLAN / ED COURSE  I reviewed the triage vital signs and the nursing notes.   Differential diagnosis includes, but is not limited to, cellulitis, contusion, fracture, DVT.  Patient is awake and alert, hemodynamically stable and afebrile.  She is unable to contribute to her history given her dementia which is at her baseline per EMS.  Labs obtained in triage are overall reassuring.  No leukocytosis.  I discussed with her memory care unit who reports that she has not been independently ambulatory for 6 months, and not ambulatory at all for the past 2 weeks.  They deny her having any trauma.  They thought that she had a blood clot or "fluid in her leg" which is why they sent her to the emergency department for evaluation.  Patient has fracture blisters to her anterior leg with erythema extending from the foot to the mid shin and diffuse pitting edema throughout her lower extremity.  There is no crepitus, hemodynamic instability, or pain out of proportion to suggest a necrotizing infection.  She has no leukocytosis.  X-rays obtained reveal displaced medial malleolar and posterior distal  tibial fracture with disruption of the ankle mortise, and possible medial malleolar osteomyelitis, as well as oblique fracture of the proximal fibular diaphysis with one half shaft width anterior displacement, and diffuse soft tissue reticulations consistent with cellulitis.  These findings were discussed with Dr. Annamary Rummage with podiatry who recommends MRI of the ankle for possible osteomyelitis, broad-spectrum antibiotics, and admission to the hospitalist service.  Patient was accepted by Dr. Alvester Morin.  Case also discussed with Dr. Lenard Lance.   Patient's presentation is most consistent with acute presentation with potential threat to life or bodily function.   Clinical Course as of 12/02/23 1517  Thu Dec 02, 2023  1506 Discussed with the nursing home who reports that she has not been independently ambulatory for the past 6 months, though not ambulatory at all for the past 2 weeks.  They deny her having any trauma [JP]  1506 Discussed with Dr. Annamary Rummage with podiatry who recommends MRI and admission to the hospitalist service [JP]    Clinical Course User Index [JP] Nivedita Mirabella, Herb Grays, PA-C     FINAL CLINICAL IMPRESSION(S) / ED DIAGNOSES   Final diagnoses:  Cellulitis of left lower extremity  Closed displaced fracture of medial malleolus of left tibia, initial encounter  Closed bimalleolar fracture of ankle with proximal fibular fracture     Rx / DC Orders   ED Discharge Orders     None        Note:  This document was prepared using Dragon voice recognition software and may include unintentional dictation errors.   Jackelyn Hoehn, PA-C 12/02/23 1518    Corena Herter, MD 12/02/23 1558

## 2023-12-02 NOTE — Assessment & Plan Note (Signed)
 Blood sugar in 170s  SSI  Monitor

## 2023-12-02 NOTE — Assessment & Plan Note (Addendum)
Acute on chronic ankle fracture/might have osteo. Noted marked redness and swelling of left lower extremity Coming from memory care unit Unclear duration of symptoms Imaging with Displaced medial malleolar and posterior distal tibial fracture with disruption of the ankle mortise,  Oblique fracture of the proximal fibular diaphysis with 1/2 shaft width anterior displacement Marked superficial redness and swelling Noted concern for cellulitis and osteomyelitis Unable to obtain MRI due to unknown bladder stimulator, ordered CT of ankle after discussing with podiatry, per podiatry they are not convinced that she has osteo likely acute on chronic ankle fractures due to frequent falls Will place on IV cefepime, Flagyl for infectious coverage Blood cultures-preliminary results negative Pain control Fall precautions Follow-up CT ankle results Podiatry is recommending conservative management with cam boot and nonweightbearing on left lower extremity If CT is concerning for osteo, need to discuss with ID regarding p.o. option for antibiotics.

## 2023-12-02 NOTE — ED Triage Notes (Signed)
Pt from [facility] Memory care unit via ems with reports of Left foot swelling and possible cellulitis. Pt is at baseline mental status.   134/82 [id number]

## 2023-12-02 NOTE — Consult Note (Signed)
ED Pharmacy Antibiotic Sign Off An antibiotic consult was received from an ED provider for vancomycin per pharmacy dosing for cellulitis. A chart review was completed to assess appropriateness.   The following one time order(s) were placed: Vancomycin 1500 mg IV x 1   Further antibiotic and/or antibiotic pharmacy consults should be ordered by the admitting provider if indicated.   Thank you for allowing pharmacy to be a part of this patient's care.   Littie Deeds, PharmD Pharmacy Resident  12/02/2023 3:08 PM

## 2023-12-02 NOTE — H&P (Signed)
History and Physical    Patient: Sandra Kaufman YQI:[id number] DOB: [birth year] DOA: 12/02/2023 DOS: the patient was seen and examined on 12/02/2023 PCP: [facility]  Patient coming from: SNF  Chief Complaint:  Chief Complaint  Patient presents with   Cellulitis   HPI: Sandra Kaufman is a 89 y.o. female with medical history significant of multiple medical issues including history of CVA, COPD, end-stage dementia, hypertension, hypothyroidism presenting with left lower extremity wound.  Limited history in the setting of end-stage dementia.  Per report, patient with left lower extremity redness and swelling that was noticed at memory care unit.  No reports of falls or trauma to the affected area.  Unclear of the exact duration.  No reported fevers or chills.  No reported nausea or vomiting.  No reports of any purulent drainage over affected area. Presented to the ER afebrile, hemodynamically stable.  Satting well on room air.  White count 9.5, hemoglobin 9.7, platelets 297, creatinine 0.92, glucose 174.  Left tib-fib showing displaced medial malleoli are and posterior distal tibial fracture with disruption of the ankle mortise and irregular appearance of the fracture lucency with concern for osteomyelitis.  Also with oblique fracture of the proximal fibular diaphysis and one half shaft width anterior displacement.  Diffuse soft tissue swelling concerning for cellulitis.  Lower extremity ultrasound negative for DVT.  Per PA Poggi, case discussed with Dr. Annamary Rummage with podiatry.  Recommending MRI of the foot and ankle to better assess.  Started on broad-spectrum antibiotics in the ER. Review of Systems: unable to review all systems due to the inability of the patient to answer questions. Past Medical History:  Diagnosis Date   Arthritis    Cancer (HCC)    skin   Colon polyps    COPD (chronic obstructive pulmonary disease) (HCC)    Diverticulosis    Frequent falls     GERD (gastroesophageal reflux disease)    with esophageal stricture requiring dilatation x 2 (12/96 and 10/99)   Glaucoma    History of chicken pox    History of CVA (cerebrovascular accident)    Hypercholesterolemia    Hypertension    Hypothyroidism    Migraines    Peripheral neuropathy    Urinary incontinence    Past Surgical History:  Procedure Laterality Date   ABDOMINAL HYSTERECTOMY  1961   ovaries not removed   APPENDECTOMY     bladder tack  1976   CATARACT EXTRACTION  1997   bilateral   CHOLECYSTECTOMY     HIP ARTHROPLASTY Left 04/16/2016   Procedure: ARTHROPLASTY BIPOLAR HIP (HEMIARTHROPLASTY);  Surgeon: Christena Flake, MD;  Location: [facility] ORS;  Service: Orthopedics;  Laterality: Left;   INTERSTIM IMPLANT PLACEMENT  2011   TONSILLECTOMY     Social History:  reports that she has never smoked. She has never used smokeless tobacco. She reports that she does not drink alcohol and does not use drugs.  Allergies  Allergen Reactions   Tramadol Nausea And Vomiting    Family History  Problem Relation Age of Onset   Heart disease Father        myocardial infarction   Arthritis/Rheumatoid Mother    Diabetes Sister    Epilepsy Sister    Breast cancer Neg Hx    Colon cancer Neg Hx     Prior to Admission medications   Medication Sig Start Date End Date Taking? Authorizing Provider  acetaminophen (TYLENOL) 325 MG tablet Take 650 mg by mouth 3 (three) times  daily as needed for headache (neck pain).    [provider]  albuterol (PROVENTIL HFA;VENTOLIN HFA) 108 (90 BASE) MCG/ACT inhaler Inhale 2 puffs into the lungs every 6 (six) hours as needed for wheezing or shortness of breath. Patient not taking: Reported on 09/21/2018 06/14/15   Carollee Leitz, RN  aspirin 325 MG tablet Take 325 mg by mouth daily.    [provider]  atorvastatin (LIPITOR) 40 MG tablet Take 40 mg by mouth at bedtime.    [provider]  bimatoprost (LUMIGAN) 0.01 % SOLN Place 1  drop into both eyes at bedtime.    [provider]  Calcium Carbonate (CALCIUM OYSTER SHELL) 1250 (500 Ca) MG TABS Take 500 mg by mouth daily.    [provider]  Coenzyme Q10 (CO Q10) 100 MG CAPS Take 100 mg by mouth daily.     [provider]  docusate sodium (COLACE) 100 MG capsule Take 1 capsule (100 mg total) by mouth 2 (two) times daily. Patient not taking: Reported on 09/21/2018 04/17/16   Milagros Loll, MD  feeding supplement, ENSURE ENLIVE, (ENSURE ENLIVE) LIQD Take 237 mLs by mouth 2 (two) times daily between meals. Patient taking differently: Take 1 Bottle by mouth daily.  04/23/16   Enid Baas, MD  fluticasone (FLONASE) 50 MCG/ACT nasal spray Place 2 sprays into both nostrils daily.    [provider]  guaifenesin (ROBITUSSIN) 100 MG/5ML syrup Take 400 mg by mouth 3 (three) times daily as needed for cough.    [provider]  lactase (LACTAID) 3000 units tablet Take 3,000 Units by mouth 3 (three) times daily as needed (dairy constipation).    [provider]  levothyroxine (SYNTHROID) 112 MCG tablet Take 100 mcg by mouth daily before breakfast.     [provider]  loperamide (IMODIUM A-D) 2 MG tablet Take 2 mg by mouth as needed for diarrhea or loose stools (Up to 3 doses in 12 hours).    [provider]  LORazepam (ATIVAN) 0.5 MG tablet Take 0.5 mg by mouth 3 (three) times daily as needed. 11/02/19   [provider]  memantine (NAMENDA) 10 MG tablet Take 10 mg by mouth 2 (two) times daily.    [provider]  metoprolol tartrate (LOPRESSOR) 25 MG tablet Take 1 tablet (25 mg total) by mouth 2 (two) times daily. Patient taking differently: Take 12.5 mg by mouth 2 (two) times daily.  07/24/16   Alford Highland, MD  Multiple Vitamin (TAB-A-VITE) TABS Take 1 tablet by mouth daily.    [provider]  pantoprazole (PROTONIX) 40 MG tablet Take 1 tablet (40 mg total) by mouth daily. 03/09/16    Midge Minium, MD  sertraline (ZOLOFT) 100 MG tablet Take 200 mg by mouth daily.     [provider]  vitamin B-12 (CYANOCOBALAMIN) 1000 MCG tablet Take 1,000 mcg by mouth 2 (two) times a week.     [provider]    Physical Exam: Vitals:   12/02/23 1111 12/02/23 1527  BP: (!) 114/90   Pulse: 92   Resp: 16   Temp: 98.7 F (37.1 C)   TempSrc: Oral   SpO2: 100%   Weight:  66.7 kg  Height:  5' 2.99" (1.6 m)   Physical Exam Constitutional:      Appearance: She is normal weight.  HENT:     Head: Normocephalic and atraumatic.     Nose: Nose normal.     Mouth/Throat:  Mouth: Mucous membranes are moist.  Eyes:     Pupils: Pupils are equal, round, and reactive to light.  Cardiovascular:     Rate and Rhythm: Normal rate and regular rhythm.  Pulmonary:     Effort: Pulmonary effort is normal.  Abdominal:     General: Bowel sounds are normal.  Musculoskeletal:     Comments: Decreased ROM and TTP of LLE   Skin:    Comments: See picture   Neurological:     Comments: + generalized confusion  Psychiatric:        Mood and Affect: Mood normal.       Data Reviewed:  There are no new results to review at this time.  DG Ankle Complete Left CLINICAL DATA:  Known ankle fracture  EXAM: LEFT ANKLE COMPLETE - 3 VIEW  COMPARISON:  Earlier same day tibia and fibula radiographs  FINDINGS: Again seen are displaced medial malleolar and posterior distal tibial fractures with disruption of the ankle mortise. There is 7 mm medial displacement of the medial malleolar fracture fragment associated with widening of the medial ankle mortise. The posterior distal tibial fracture fragment is distracted by approximately 3 mm. Again seen is amorphous radiodensity projecting along the medial malleolar fracture. A subtle transversely oriented lucency is seen through the distal fibular diaphysis. Diffuse soft tissue swelling about the ankle.  IMPRESSION: 1. Displaced  medial malleolar and posterior distal tibial fractures with disruption of the ankle mortise. Amorphous radiodensities are again seen, suggestive of interval healing, however osteomyelitis remains within the differential. 2. Subtle transversely oriented lucency through the distal fibular diaphysis may reflect a nondisplaced fracture.  Electronically Signed   By: Agustin Cree M.D.   On: 12/02/2023 16:49 DG Tibia/Fibula Left CLINICAL DATA:  Cellulitis and bruising  EXAM: LEFT TIBIA AND FIBULA - 2 VIEW  COMPARISON:  None Available.  FINDINGS: Oblique fracture of the proximal fibular diaphysis with 1/2 shaft width anterior displacement. Findings of bony bridging. Displaced medial malleolar and posterior distal tibial fracture with disruption of the ankle mortise. Irregular appearance of the fracture lucency with amorphous calcification along the medial malleolus. Diffuse osteopenia. Diffuse soft tissue reticulations of the leg.  IMPRESSION: 1. Displaced medial malleolar and posterior distal tibial fracture with disruption of the ankle mortise. Irregular appearance of the fracture lucency with amorphous calcification along the medial malleolus, which may be related to healing changes, however osteomyelitis is not excluded. 2. Oblique fracture of the proximal fibular diaphysis with 1/2 shaft width anterior displacement. Findings of bony bridging, suggestive of subacute fracture. 3. Diffuse soft tissue reticulations of the leg, which may be seen in the setting of cellulitis.  Electronically Signed   By: Agustin Cree M.D.   On: 12/02/2023 14:45 US Venous Img Lower  Left (DVT Study) CLINICAL DATA:  Swelling  EXAM: LEFT LOWER EXTREMITY VENOUS DOPPLER ULTRASOUND  TECHNIQUE: Gray-scale sonography with compression, as well as color and duplex ultrasound, were performed to evaluate the deep venous system(s) from the level of the common femoral vein through the popliteal and proximal  calf veins.  COMPARISON:  None available  FINDINGS: VENOUS  Normal compressibility of the common femoral, superficial femoral, and popliteal veins, as well as the visualized calf veins. Visualized portions of profunda femoral vein and great saphenous vein unremarkable. No filling defects to suggest DVT on grayscale or color Doppler imaging. Doppler waveforms show normal direction of venous flow, normal respiratory plasticity and response to augmentation.  Limited views of the contralateral common femoral vein  are unremarkable.  OTHER  None.  Limitations: none  IMPRESSION: No DVT of the left lower extremity.  Electronically Signed   By: Acquanetta Belling M.D.   On: 12/02/2023 14:02  Lab Results  Component Value Date   WBC 9.5 12/02/2023   HGB 9.7 (L) 12/02/2023   HCT 29.6 (L) 12/02/2023   MCV 92.2 12/02/2023   PLT 297 12/02/2023   Last metabolic panel Lab Results  Component Value Date   GLUCOSE 174 (H) 12/02/2023   NA 135 12/02/2023   K 4.0 12/02/2023   CL 101 12/02/2023   CO2 26 12/02/2023   BUN 16 12/02/2023   CREATININE 0.92 12/02/2023   GFRNONAA 60 (L) 12/02/2023   CALCIUM 8.4 (L) 12/02/2023   PROT 7.3 11/11/2020   ALBUMIN 4.0 11/11/2020   BILITOT 0.7 11/11/2020   ALKPHOS 62 11/11/2020   AST 22 11/11/2020   ALT 17 11/11/2020   ANIONGAP 8 12/02/2023    Assessment and Plan: Non-healing wound of left lower extremity Noted marked redness and swelling of left lower extremity Coming from memory care unit Unclear duration of symptoms Imaging with Displaced medial malleolar and posterior distal tibial fracture with disruption of the ankle mortise,  Oblique fracture of the proximal fibular diaphysis with 1/2 shaft width anterior displacement Marked superficial redness and swelling Noted concern for cellulitis and osteomyelitis Dr. Annamary Rummage with podiatry consulted by ER PA Pending MRI of the foot and ankle to better assess Will place on IV cefepime, Flagyl  vancomycin for infectious coverage Blood cultures Pain control Fall precautions Follow-up imaging and podiatry recommendations  Multiple falls Fall precautions  Dementia (HCC) Baseline end-stage dementia At memory care unit Continue Namenda Monitor  Chronic obstructive pulmonary disease (HCC) Stable from a respiratory standpoint Continue home inhalers  GERD (gastroesophageal reflux disease) PPI  Hypertension BP stable Titrate home regimen  Diabetes mellitus (HCC) Blood sugar in 170s SSI Monitor      Advance Care Planning:   Code Status: Limited: Do not attempt resuscitation (DNR) -DNR-LIMITED -Do Not Intubate/DNI    Consults: Podiatry- [place] per EDPA Poggi  Family Communication: No family at the bedside   Severity of Illness: The appropriate patient status for this patient is INPATIENT. Inpatient status is judged to be reasonable and necessary in order to provide the required intensity of service to ensure the patient's safety. The patient's presenting symptoms, physical exam findings, and initial radiographic and laboratory data in the context of their chronic comorbidities is felt to place them at high risk for further clinical deterioration. Furthermore, it is not anticipated that the patient will be medically stable for discharge from the hospital within 2 midnights of admission.   * I certify that at the point of admission it is my clinical judgment that the patient will require inpatient hospital care spanning beyond 2 midnights from the point of admission due to high intensity of service, high risk for further deterioration and high frequency of surveillance required.*  Author: Floydene Flock, MD 12/02/2023 5:05 PM  For on call review [email or web address].

## 2023-12-02 NOTE — ED Triage Notes (Signed)
Left lower extremity is bruised, tight, red and warmer than right leg.  Swollen to knee.

## 2023-12-02 NOTE — ED Notes (Signed)
Provider Alvester Morin MD wanted verification of code status. This nurse spoke with her son [name] who has informed myself as well as Legrand Como that the patient has a DNR. Provider made aware.

## 2023-12-02 NOTE — Consult Note (Signed)
Pharmacy Antibiotic Note  Sandra Kaufman is a 89 y.o. female admitted on 12/02/2023 with  suspected diabetic foot infection . PMH is significant for diabetes, hypertension, dementia, and falls. Lower left extremity with bruising and erythema. Pharmacy has been consulted for cefepime dosing.  Today, 12/02/2023 Day 1 of antibiotics WBC 9.5  Afebrile  Scr 0.92 with estimated CrCl 38 mL/min  Received ceftriaxone 1 gm x 1 dose in ED @ 1311  Plan: Start cefepime 2 gm IV Q12H based on current renal function  Pharmacy will continue to monitor and dose adjust appropriately   Weight: 66.7 kg (147 lb) (bed weight)  Temp (24hrs), Avg:98.7 F (37.1 C), Min:98.7 F (37.1 C), Max:98.7 F (37.1 C)  Recent Labs  Lab 12/02/23 1118  WBC 9.5  CREATININE 0.92    CrCl cannot be calculated (Unknown ideal weight.).    Allergies  Allergen Reactions   Tramadol Nausea And Vomiting   Antimicrobials this admission: 12/5 Ceftriaxone x 1  12/5 Cefepime >>  Dose adjustments this admission:  Microbiology results:  Thank you for allowing pharmacy to be a part of this patient's care.  Littie Deeds, PharmD Pharmacy Resident  12/02/2023 4:52 PM

## 2023-12-02 NOTE — Assessment & Plan Note (Signed)
Stable from a respiratory standpoint Continue home inhalers 

## 2023-12-02 NOTE — Assessment & Plan Note (Signed)
BP stable Titrate home regimen 

## 2023-12-02 NOTE — ED Notes (Signed)
Bed alarm placed on patient

## 2023-12-02 NOTE — Assessment & Plan Note (Signed)
PPI ?

## 2023-12-02 NOTE — Assessment & Plan Note (Signed)
Fall precautions.  

## 2023-12-02 NOTE — Assessment & Plan Note (Signed)
Baseline end-stage dementia At memory care unit [facility]

## 2023-12-03 ENCOUNTER — Inpatient Hospital Stay: Payer: Medicare Other

## 2023-12-03 DIAGNOSIS — R296 Repeated falls: Secondary | ICD-10-CM | POA: Diagnosis not present

## 2023-12-03 DIAGNOSIS — F03C0 Unspecified dementia, severe, without behavioral disturbance, psychotic disturbance, mood disturbance, and anxiety: Secondary | ICD-10-CM | POA: Diagnosis not present

## 2023-12-03 DIAGNOSIS — I10 Essential (primary) hypertension: Secondary | ICD-10-CM

## 2023-12-03 DIAGNOSIS — S8252XA Displaced fracture of medial malleolus of left tibia, initial encounter for closed fracture: Secondary | ICD-10-CM | POA: Diagnosis not present

## 2023-12-03 DIAGNOSIS — S81802A Unspecified open wound, left lower leg, initial encounter: Secondary | ICD-10-CM | POA: Diagnosis not present

## 2023-12-03 DIAGNOSIS — E878 Other disorders of electrolyte and fluid balance, not elsewhere classified: Secondary | ICD-10-CM

## 2023-12-03 LAB — COMPREHENSIVE METABOLIC PANEL
ALT: 10 U/L (ref 0–44)
AST: 12 U/L — ABNORMAL LOW (ref 15–41)
Albumin: 2.9 g/dL — ABNORMAL LOW (ref 3.5–5.0)
Alkaline Phosphatase: 67 U/L (ref 38–126)
Anion gap: 10 (ref 5–15)
BUN: 14 mg/dL (ref 8–23)
CO2: 23 mmol/L (ref 22–32)
Calcium: 8 mg/dL — ABNORMAL LOW (ref 8.9–10.3)
Chloride: 104 mmol/L (ref 98–111)
Creatinine, Ser: 0.72 mg/dL (ref 0.44–1.00)
GFR, Estimated: >60 mL/min (ref >60)
Glucose, Bld: 140 mg/dL — ABNORMAL HIGH (ref 70–99)
Potassium: 3.4 mmol/L — ABNORMAL LOW (ref 3.5–5.1)
Sodium: 137 mmol/L (ref 135–145)
Total Bilirubin: 1.4 mg/dL — ABNORMAL HIGH (ref <1.2)
Total Protein: 6 g/dL — ABNORMAL LOW (ref 6.5–8.1)

## 2023-12-03 LAB — CBC
HCT: 24.8 % — ABNORMAL LOW (ref 36.0–46.0)
Hemoglobin: 8.2 g/dL — ABNORMAL LOW (ref 12.0–15.0)
MCH: 30.9 pg (ref 26.0–34.0)
MCHC: 33.1 g/dL (ref 30.0–36.0)
MCV: 93.6 fL (ref 80.0–100.0)
Platelets: 194 10*3/uL (ref 150–400)
RBC: 2.65 MIL/uL — ABNORMAL LOW (ref 3.87–5.11)
RDW: 13.6 % (ref 11.5–15.5)
WBC: 6.6 10*3/uL (ref 4.0–10.5)
nRBC: 0 % (ref 0.0–0.2)

## 2023-12-03 LAB — GLUCOSE, CAPILLARY
Glucose-Capillary: 122 mg/dL — ABNORMAL HIGH (ref 70–99)
Glucose-Capillary: 129 mg/dL — ABNORMAL HIGH (ref 70–99)
Glucose-Capillary: 134 mg/dL — ABNORMAL HIGH (ref 70–99)

## 2023-12-03 LAB — CBG MONITORING, ED
Glucose-Capillary: 127 mg/dL — ABNORMAL HIGH (ref 70–99)
Glucose-Capillary: 122 mg/dL — ABNORMAL HIGH (ref 70–99)

## 2023-12-03 LAB — CREATININE, SERUM
Creatinine, Ser: 0.74 mg/dL (ref 0.44–1.00)
GFR, Estimated: >60 mL/min (ref >60)

## 2023-12-03 LAB — MAGNESIUM: Magnesium: 1.4 mg/dL — ABNORMAL LOW (ref 1.7–2.4)

## 2023-12-03 MED ORDER — OXYCODONE HCL 5 MG PO TABS
5.0000 mg | ORAL_TABLET | Freq: Four times a day (QID) | ORAL | Status: DC | PRN
  Administered 2023-12-04 – 2023-12-09 (×4): 5 mg via ORAL
  Filled 2023-12-03 (×4): qty 1

## 2023-12-03 MED ORDER — SERTRALINE HCL 100 MG PO TABS
200.0000 mg | ORAL_TABLET | Freq: Every day | ORAL | Status: DC
  Administered 2023-12-03 – 2023-12-08 (×6): 200 mg via ORAL
  Filled 2023-12-03: qty 2
  Filled 2023-12-03: qty 4
  Filled 2023-12-03 (×3): qty 2
  Filled 2023-12-03: qty 4
  Filled 2023-12-03: qty 2
  Filled 2023-12-03: qty 4
  Filled 2023-12-03: qty 2
  Filled 2023-12-03: qty 4
  Filled 2023-12-03: qty 2
  Filled 2023-12-03: qty 4

## 2023-12-03 MED ORDER — MAGNESIUM SULFATE 4 GM/100ML IV SOLN
4.0000 g | Freq: Once | INTRAVENOUS | Status: AC
  Administered 2023-12-03: 4 g via INTRAVENOUS
  Filled 2023-12-03: qty 100

## 2023-12-03 MED ORDER — FLUTICASONE PROPIONATE 50 MCG/ACT NA SUSP
2.0000 | Freq: Every day | NASAL | Status: DC
  Administered 2023-12-03 – 2023-12-08 (×5): 2 via NASAL
  Filled 2023-12-03 (×2): qty 16

## 2023-12-03 MED ORDER — QUETIAPINE FUMARATE 25 MG PO TABS
25.0000 mg | ORAL_TABLET | Freq: Two times a day (BID) | ORAL | Status: DC
  Administered 2023-12-03 – 2023-12-08 (×12): 25 mg via ORAL
  Filled 2023-12-03 (×13): qty 1

## 2023-12-03 MED ORDER — POTASSIUM CHLORIDE 20 MEQ PO PACK
40.0000 meq | PACK | Freq: Once | ORAL | Status: AC
  Administered 2023-12-03: 40 meq via ORAL
  Filled 2023-12-03: qty 2

## 2023-12-03 MED ORDER — MEMANTINE HCL 5 MG PO TABS
10.0000 mg | ORAL_TABLET | Freq: Two times a day (BID) | ORAL | Status: DC
  Administered 2023-12-03 – 2023-12-08 (×12): 10 mg via ORAL
  Filled 2023-12-03 (×13): qty 2

## 2023-12-03 MED ORDER — LEVOTHYROXINE SODIUM 50 MCG PO TABS
100.0000 ug | ORAL_TABLET | Freq: Every day | ORAL | Status: DC
  Administered 2023-12-04 – 2023-12-09 (×6): 100 ug via ORAL
  Filled 2023-12-03: qty 1
  Filled 2023-12-03 (×3): qty 2
  Filled 2023-12-03 (×2): qty 1

## 2023-12-03 MED ORDER — INSULIN ASPART 100 UNIT/ML IJ SOLN
0.0000 [IU] | Freq: Three times a day (TID) | INTRAMUSCULAR | Status: DC
  Administered 2023-12-03 – 2023-12-07 (×5): 1 [IU] via SUBCUTANEOUS
  Administered 2023-12-08 – 2023-12-09 (×3): 2 [IU] via SUBCUTANEOUS
  Administered 2023-12-09: 1 [IU] via SUBCUTANEOUS
  Filled 2023-12-03 (×8): qty 1

## 2023-12-03 MED ORDER — BUSPIRONE HCL 10 MG PO TABS
5.0000 mg | ORAL_TABLET | Freq: Every day | ORAL | Status: DC
Start: 2023-12-03 — End: 2023-12-09
  Administered 2023-12-03 – 2023-12-08 (×6): 5 mg via ORAL
  Filled 2023-12-03 (×7): qty 1

## 2023-12-03 MED ORDER — INSULIN ASPART 100 UNIT/ML IJ SOLN: 0.0000 [IU] | Freq: Every day | INTRAMUSCULAR | Status: DC

## 2023-12-03 NOTE — Consult Note (Signed)
Pharmacy Antibiotic Note  Sandra Kaufman is a 89 y.o. female admitted on 12/02/2023 with  suspected diabetic foot infection . PMH is significant for diabetes, hypertension, dementia, and falls. Lower left extremity with bruising and erythema. Pharmacy has been consulted for cefepime dosing.  Day 2 of antibiotics WBC 9.5 > 6.6 Afebrile  Scr 0.92> 0.72 with estimated CrCl 43 mL/min   Plan: Continue cefepime 2 gm IV Q12H based on current renal function  Pharmacy will continue to monitor and dose adjust appropriately   Height: 5' 2.99" (160 cm) Weight: 66.7 kg (147 lb) (bed weight) IBW/kg (Calculated) : 52.38  Temp (24hrs), Avg:98.2 F (36.8 C), Min:98 F (36.7 C), Max:98.4 F (36.9 C)  Recent Labs  Lab 12/02/23 1118 12/02/23 1938 12/03/23 0542  WBC 9.5 7.7 6.6  CREATININE 0.92 0.74 0.72    Estimated Creatinine Clearance: 43.7 mL/min (by C-G formula based on SCr of 0.72 mg/dL).    Allergies  Allergen Reactions   Tramadol Nausea And Vomiting   Antimicrobials this admission: 12/5 Ceftriaxone x 1  12/5 Cefepime >>  Dose adjustments this admission: none  Microbiology results: 12/6: Bcx NG < 12hrs  Thank you for allowing pharmacy to be a part of this patient's care.  Delman Goshorn Rodriguez-Guzman PharmD, BCPS 12/03/2023 11:21 AM

## 2023-12-03 NOTE — Consult Note (Addendum)
PODIATRY CONSULTATION  NAME Sandra Kaufman MRN [id number] DOB [birth year] DOA 12/02/2023   Reason for consult:  Chief Complaint  Patient presents with   Cellulitis    Attending/Consulting physician: S. Amin MD  History of present illness from Dr. Alvester Morin H&P: "89 y.o. female  with medical history significant of multiple medical issues including history of CVA, COPD, end-stage dementia, hypertension, hypothyroidism presenting with left lower extremity wound.  Limited history in the setting of end-stage dementia.  Per report, patient with left lower extremity redness and swelling that was noticed at memory care unit.  No reports of falls or trauma to the affected area.  Unclear of the exact duration.  No reported fevers or chills.  No reported nausea or vomiting.  No reports of any purulent drainage over affected area. Presented to the ER afebrile, hemodynamically stable.  Satting well on room air.  White count 9.5, hemoglobin 9.7, platelets 297, creatinine 0.92, glucose 174.  Left tib-fib showing displaced medial malleoli are and posterior distal tibial fracture with disruption of the ankle mortise and irregular appearance of the fracture lucency with concern for osteomyelitis.  Also with oblique fracture of the proximal fibular diaphysis and one half shaft width anterior displacement.  Diffuse soft tissue swelling concerning for cellulitis.  Lower extremity ultrasound negative for DVT. "  Pt unable to answer questions due to dementia. Does not know how she hurt her ankle.   Past Medical History:  Diagnosis Date   Arthritis    Cancer (HCC)    skin   Colon polyps    COPD (chronic obstructive pulmonary disease) (HCC)    Diverticulosis    Frequent falls    GERD (gastroesophageal reflux disease)    with esophageal stricture requiring dilatation x 2 (12/96 and 10/99)   Glaucoma    History of chicken pox    History of CVA (cerebrovascular accident)    Hypercholesterolemia     Hypertension    Hypothyroidism    Migraines    Peripheral neuropathy    Urinary incontinence        Latest Ref Rng & Units 12/03/2023    5:42 AM 12/02/2023    7:38 PM 12/02/2023   11:18 AM  CBC  WBC 4.0 - 10.5 K/uL 6.6  7.7  9.5   Hemoglobin 12.0 - 15.0 g/dL 8.2  8.8  9.7   Hematocrit 36.0 - 46.0 % 24.8  26.6  29.6   Platelets 150 - 400 K/uL 194  231  297        Latest Ref Rng & Units 12/03/2023    5:42 AM 12/02/2023    7:38 PM 12/02/2023   11:18 AM  BMP  Glucose 70 - 99 mg/dL 119   147   BUN 8 - 23 mg/dL 14   16   Creatinine 8.29 - 1.00 mg/dL 5.62  1.30  8.65   Sodium 135 - 145 mmol/L 137   135   Potassium 3.5 - 5.1 mmol/L 3.4   4.0   Chloride 98 - 111 mmol/L 104   101   CO2 22 - 32 mmol/L 23   26   Calcium 8.9 - 10.3 mg/dL 8.0   8.4       Physical Exam: Lower Extremity Exam Vasc: R - PT palpable, DP palpable. Cap refill < 3 sec to digits  L - PT palpable, DP palpable. Cap refill <3 sec to digits  Derm: R - Normal temp/texture/turgor with no open lesion or clinical signs of  infection  L - Small superficial ulceration anterior ankle, likely prior fracture blister that burst. Diffuse ecchymosis, no medial mal wound noted.     MSK:  R -  No gross deformities. Compartments soft, non-tender, compressible  L - Diffuse edema, pain on palpation about the medial qand lateral mal  Neuro: R - Gross sensation intact. Gross motor function intact   L - Gross sensation intact. Gross motor function intact    ASSESSMENT/PLAN OF CARE 89 y.o. female with PMHx significant for  history of CVA, COPD, end-stage dementia, hypertension, hypothyroidism presenting with left lower extremity wound  with age indeterminate left ankle fracture - likely acute on chronic fracture with recent worsening of fx displacement in past couple weeks.   XR L ankle: 1. Displaced medial malleolar and posterior distal tibial fractures with disruption of the ankle mortise. Amorphous radiodensities are again  seen, suggestive of interval healing, however osteomyelitis remains within the differential. 2. Subtle transversely oriented lucency through the distal fibular diaphysis may reflect a nondisplaced fracture.  - No current surgical plans. Likely will treat her non operatively. Recommend CT scan for further characterization of the fx. Low suspicion for OM despite elevated inflammatory markers as no obvious medial or lateral ulceration.  - Continue IV abx broad spectrum pending further culture data, ID recs would be appreciated for abx guidance - Anticoagulation: per primary - Wound care: Xerform over wounds and ace wrap over the left foot and lower leg - Recommend CAM boot to LLE - WB status: NWB to LLE - Will continue to follow   Thank you for the consult.  Please contact me directly with any questions or concerns.           Corinna Gab, DPM [facility]    [address] [place], [place] [address]                Office [phone]  Fax [phone]

## 2023-12-03 NOTE — Plan of Care (Signed)
  Problem: Education: Goal: Ability to describe self-care measures that may prevent or decrease complications (Diabetes Survival Skills Education) will improve Outcome: Progressing   Problem: Education: Goal: Individualized Educational Video(s) Outcome: Progressing   Problem: Coping: Goal: Ability to adjust to condition or change in health will improve Outcome: Progressing   Problem: Fluid Volume: Goal: Ability to maintain a balanced intake and output will improve Outcome: Progressing  Tex Conroy V Irwin Toran

## 2023-12-03 NOTE — Hospital Course (Addendum)
Taken from H&P.   Sandra Kaufman is a 89 y.o. female with medical history significant of multiple medical issues including history of CVA, COPD, end-stage dementia, hypertension, hypothyroidism presenting with left lower extremity wound from her memory care unit.  Limited history due to end-stage dementia.  No reported falls or trauma to the affected area.  Unclear duration.  On presentation hemodynamically stable, no leukocytosis, hemoglobin 9.7, creatinine 0.92, glucose 174. Left tib-fib showing displaced medial malleoli are and posterior distal tibial fracture with disruption of the ankle mortise and irregular appearance of the fracture lucency with concern for osteomyelitis. Also with oblique fracture of the proximal fibular diaphysis and one half shaft width anterior displacement. Diffuse soft tissue swelling concerning for cellulitis. Lower extremity ultrasound negative for DVT. Per PA Poggi, case discussed with Dr. Annamary Rummage with podiatry. Recommending MRI of the foot and ankle to better assess. Started on broad-spectrum antibiotics in the ER.   12/6: Vital and labs stable, mild hypokalemia with potassium at 3.4.  And hypomagnesemia which are being repleted.  Elevated CRP at 6 and ESR at 75, prealbumin low at 13.  Unable to obtain MRI due to presence of unknown bladder stimulator.  Discussed with podiatry they does not think that it is osteo but recommending obtaining CT scan of ankle which was ordered.  Likely an acute on chronic ankle fracture. Significant edema and bruising of left lower extremity and foot. Not a surgical candidate, podiatry is recommending cam boot and nonweightbearing on left lower extremity.  Based on CT ankle results we can ask ID for their recommendations if need to treat as osteo.  She had a very shallow small ulcer without any significant sign of infection on anterior side of ankle.  Likely can go back to her memory care unit tomorrow on p.o. antibiotics if needed  with conservative management for pain, cam boot and NWB on LE.

## 2023-12-03 NOTE — Progress Notes (Addendum)
Progress Note   Patient: Sandra Kaufman WGN:[id number] DOB: [birth year] DOA: 12/02/2023     1 DOS: the patient was seen and examined on 12/03/2023   Brief hospital course: Taken from H&P.   Sandra Kaufman is a 89 y.o. female with medical history significant of multiple medical issues including history of CVA, COPD, end-stage dementia, hypertension, hypothyroidism presenting with left lower extremity wound from her memory care unit.  Limited history due to end-stage dementia.  No reported falls or trauma to the affected area.  Unclear duration.  On presentation hemodynamically stable, no leukocytosis, hemoglobin 9.7, creatinine 0.92, glucose 174. Left tib-fib showing displaced medial malleoli are and posterior distal tibial fracture with disruption of the ankle mortise and irregular appearance of the fracture lucency with concern for osteomyelitis. Also with oblique fracture of the proximal fibular diaphysis and one half shaft width anterior displacement. Diffuse soft tissue swelling concerning for cellulitis. Lower extremity ultrasound negative for DVT. Per PA Poggi, case discussed with Dr. Annamary Rummage with podiatry. Recommending MRI of the foot and ankle to better assess. Started on broad-spectrum antibiotics in the ER.   12/6: Vital and labs stable, mild hypokalemia with potassium at 3.4.  And hypomagnesemia which are being repleted.  Elevated CRP at 6 and ESR at 75, prealbumin low at 13.  Unable to obtain MRI due to presence of unknown bladder stimulator.  Discussed with podiatry they does not think that it is osteo but recommending obtaining CT scan of ankle which was ordered.  Likely an acute on chronic ankle fracture. Significant edema and bruising of left lower extremity and foot. Not a surgical candidate, podiatry is recommending cam boot and nonweightbearing on left lower extremity.  Based on CT ankle results we can ask ID for their recommendations if need to treat as osteo.  She  had a very shallow small ulcer without any significant sign of infection on anterior side of ankle.  Likely can go back to her memory care unit tomorrow on p.o. antibiotics if needed with conservative management for pain, cam boot and NWB on LE.      Assessment and Plan: * Non-healing wound of left lower extremity Acute on chronic ankle fracture/might have osteo. Noted marked redness and swelling of left lower extremity Coming from memory care unit Unclear duration of symptoms Imaging with Displaced medial malleolar and posterior distal tibial fracture with disruption of the ankle mortise,  Oblique fracture of the proximal fibular diaphysis with 1/2 shaft width anterior displacement Marked superficial redness and swelling Noted concern for cellulitis and osteomyelitis Unable to obtain MRI due to unknown bladder stimulator, ordered CT of ankle after discussing with podiatry, per podiatry they are not convinced that she has osteo likely acute on chronic ankle fractures due to frequent falls Will place on IV cefepime, Flagyl for infectious coverage Blood cultures-preliminary results negative Pain control Fall precautions Follow-up CT ankle results Podiatry is recommending conservative management with cam boot and nonweightbearing on left lower extremity If CT is concerning for osteo, need to discuss with ID regarding p.o. option for antibiotics.  Multiple falls Fall precautions  Dementia (HCC) Baseline end-stage dementia At memory care unit Continue Namenda Monitor  Chronic obstructive pulmonary disease (HCC) Stable from a respiratory standpoint Continue home inhalers  Hypertension BP on softer side Holding home antihypertensives  Diabetes mellitus (HCC) Blood sugar in 170s SSI Monitor  GERD (gastroesophageal reflux disease) PPI  Electrolyte abnormality Mild hypokalemia with potassium at 3.4 and hypomagnesemia with magnesium at 1.4. -Replete electrolytes  and  monitor   Subjective: Patient was seen and examined today.,  Appears to have some ankle tenderness but unable to explain any pain.  Advanced dementia, follows some commands.  Physical Exam: Vitals:   12/03/23 0351 12/03/23 0600 12/03/23 0758 12/03/23 0848  BP: (!) 108/57 126/66  (!) 98/58  Pulse: 92 81  86  Resp: 16 18    Temp: 98.1 F (36.7 C)  98.2 F (36.8 C) 98 F (36.7 C)  TempSrc: Oral  Oral Oral  SpO2: 96% 97%  97%  Weight:      Height:       General.  Frail elderly lady, in no acute distress. Pulmonary.  Lungs clear bilaterally, normal respiratory effort. CV.  Regular rate and rhythm, no JVD, rub or murmur. Abdomen.  Soft, nontender, nondistended, BS positive. CNS.  Alert and oriented to name only.  No focal neurologic deficit. Extremities.  Significant bruising and edema involving left lower extremity involving lower leg and foot, a small shallow ulcer on anterior surface of left ankle.  Pulses intact and symmetrical. Pictures in H&P Psychiatry.  Judgment and insight appears impaired.  Data Reviewed: Prior data reviewed  Family Communication: Talked with son on phone  Disposition: Status is: Inpatient Remains inpatient appropriate because: Severity of illness  Planned Discharge Destination: Skilled nursing facility  DVT prophylaxis.  Lovenox Time spent: 50 minutes  This record has been created using Conservation officer, historic buildings. Errors have been sought and corrected,but may not always be located. Such creation errors do not reflect on the standard of care.   Author: Arnetha Courser, MD 12/03/2023 1:55 PM  For on call review [email or web address].

## 2023-12-03 NOTE — Assessment & Plan Note (Signed)
Mild hypokalemia with potassium at 3.4 and hypomagnesemia with magnesium at 1.4. -Replete electrolytes and monitor

## 2023-12-03 NOTE — Assessment & Plan Note (Signed)
Blood pressure has been stable, at the time of discharge will continue with metoprolol and amlodipine.

## 2023-12-03 NOTE — TOC Initial Note (Signed)
Transition of Care [facility]) - Initial/Assessment Note    Patient Details  Name: Sandra Kaufman MRN: [id number] Date of Birth: 24-Nov-1932  Transition of Care [facility]) CM/SW Contact:    Chapman Fitch, RN Phone Number: 12/03/2023, 3:45 PM  Clinical Narrative:                    Noted from [facility] VM left x 2 from Hetty Blend to review and determine patients baseline      Patient Goals and CMS Choice            Expected Discharge Plan and Services                                              Prior Living Arrangements/Services                       Activities of Daily Living   ADL Screening (condition at time of admission) Independently performs ADLs?: No Does the patient have a NEW difficulty with bathing/dressing/toileting/self-feeding that is expected to last >3 days?: No Does the patient have a NEW difficulty with getting in/out of bed, walking, or climbing stairs that is expected to last >3 days?: No Does the patient have a NEW difficulty with communication that is expected to last >3 days?: No Is the patient deaf or have difficulty hearing?: Yes Does the patient have difficulty seeing, even when wearing glasses/contacts?: Yes Does the patient have difficulty concentrating, remembering, or making decisions?: Yes  Permission Sought/Granted                  Emotional Assessment              Admission diagnosis:  Cellulitis [L03.90] Cellulitis of left lower extremity [L03.116] Closed displaced fracture of medial malleolus of left tibia, initial encounter [S82.52XA] Closed bimalleolar fracture of ankle with proximal fibular fracture [M57.846N, S82.839A] Patient Active Problem List   Diagnosis Date Noted   Closed displaced fracture of medial malleolus of left tibia 12/03/2023   Electrolyte abnormality 12/03/2023   Non-healing wound of left lower extremity 12/02/2023   Ileus (HCC) 08/27/2016   Multiple falls  07/22/2016   Dementia (HCC) 04/22/2016   Fracture of femoral neck, left (HCC) 04/16/2016   Closed fracture of neck of left femur, initial encounter (HCC) 04/16/2016   Vomiting 03/17/2016   Rash 03/17/2016   Diarrhea 02/18/2016   Adult hypothyroidism 01/29/2016   Fall 01/19/2016   Anemia 10/20/2015   Headache, migraine 10/10/2015   HLD (hyperlipidemia) 10/10/2015   Glaucoma 10/10/2015   Chronic obstructive pulmonary disease (HCC) 10/10/2015   Arthritis 10/10/2015   Hypertrophic toenail 07/23/2015   Health care maintenance 05/27/2015   Difficulty in walking 10/04/2014   Cerebral ataxia (HCC) 10/04/2014   Unsteady gait 08/22/2014   Obesity 08/22/2014   Dysphagia 07/14/2014   Amnesia 05/24/2014   B12 deficiency 05/24/2014   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   SOB (shortness of breath) 05/06/2014   CVA (cerebral vascular accident) (HCC) 03/04/2014   Diverticulosis 04/29/2013   GERD (gastroesophageal reflux disease) 11/27/2012   Urinary incontinence 11/27/2012   Ovarian cyst 11/27/2012   Depression 11/27/2012   Neuropathy 11/27/2012   Hypothyroidism 11/15/2012   Hypercholesterolemia 11/15/2012   Diabetes mellitus (HCC) 11/15/2012   Hypertension 11/15/2012   PCP:  [facility] Pharmacy:  [id number] - [place], [place] - [address] [place] [address] Phone: [phone] Fax: [phone]     Social Determinants of Health (SDOH) Social History: SDOH Screenings   Tobacco Use: Low Risk  (12/02/2023)   SDOH Interventions:     Readmission Risk Interventions     No data to display

## 2023-12-04 DIAGNOSIS — E876 Hypokalemia: Secondary | ICD-10-CM

## 2023-12-04 DIAGNOSIS — S8252XA Displaced fracture of medial malleolus of left tibia, initial encounter for closed fracture: Secondary | ICD-10-CM | POA: Diagnosis not present

## 2023-12-04 DIAGNOSIS — K219 Gastro-esophageal reflux disease without esophagitis: Secondary | ICD-10-CM

## 2023-12-04 DIAGNOSIS — G309 Alzheimer's disease, unspecified: Secondary | ICD-10-CM

## 2023-12-04 DIAGNOSIS — F02C0 Dementia in other diseases classified elsewhere, severe, without behavioral disturbance, psychotic disturbance, mood disturbance, and anxiety: Secondary | ICD-10-CM

## 2023-12-04 DIAGNOSIS — E785 Hyperlipidemia, unspecified: Secondary | ICD-10-CM

## 2023-12-04 DIAGNOSIS — S81802A Unspecified open wound, left lower leg, initial encounter: Secondary | ICD-10-CM | POA: Diagnosis not present

## 2023-12-04 LAB — GLUCOSE, CAPILLARY
Glucose-Capillary: 135 mg/dL — ABNORMAL HIGH (ref 70–99)
Glucose-Capillary: 148 mg/dL — ABNORMAL HIGH (ref 70–99)
Glucose-Capillary: 158 mg/dL — ABNORMAL HIGH (ref 70–99)
Glucose-Capillary: 113 mg/dL — ABNORMAL HIGH (ref 70–99)
Glucose-Capillary: 119 mg/dL — ABNORMAL HIGH (ref 70–99)

## 2023-12-04 MED ORDER — AMOXICILLIN-POT CLAVULANATE 400-57 MG/5ML PO SUSR
400.0000 mg | Freq: Two times a day (BID) | ORAL | Status: AC
  Administered 2023-12-04 – 2023-12-08 (×9): 400 mg via ORAL
  Filled 2023-12-04 (×11): qty 5

## 2023-12-04 MED ORDER — LORAZEPAM 0.5 MG PO TABS
0.5000 mg | ORAL_TABLET | Freq: Three times a day (TID) | ORAL | Status: DC | PRN
  Administered 2023-12-07: 0.5 mg via ORAL
  Filled 2023-12-04: qty 1

## 2023-12-04 MED ORDER — CEPHALEXIN 500 MG PO CAPS: 500.0000 mg | ORAL_CAPSULE | Freq: Two times a day (BID) | ORAL | Status: DC

## 2023-12-04 MED ORDER — LATANOPROST 0.005 % OP SOLN
1.0000 [drp] | Freq: Every day | OPHTHALMIC | Status: DC
  Administered 2023-12-04 – 2023-12-08 (×4): 1 [drp] via OPHTHALMIC
  Filled 2023-12-04 (×2): qty 2.5

## 2023-12-04 NOTE — Assessment & Plan Note (Addendum)
No clinical signs of osteomyelitis. She has been afebrile, and no leukocytosis.  Blood cultures with no growth.   Plan to continue local wound care with: Xerform over wounds and ace wrap over the left foot and lower leg per podiatry recommendations.  Will continue short course of antibiotic therapy with Augmentin for superficial cellulitis.  Osteomyelitis has been ruled out.

## 2023-12-04 NOTE — Assessment & Plan Note (Signed)
Replaced Patient received Kcl and Mag sulfate for electrolyte correction.

## 2023-12-04 NOTE — Plan of Care (Signed)

## 2023-12-04 NOTE — Assessment & Plan Note (Signed)
Trimalleolar fracture of left ankle.  Plan to continue pain control, CAM boot to LEE.  WB NWB to LLE.  Follow up as outpatient with podiatry.  Continue pain control with oral analgesics.

## 2023-12-04 NOTE — Assessment & Plan Note (Signed)
Continue with pantoprazole/.  

## 2023-12-04 NOTE — Assessment & Plan Note (Signed)
No signs of acute exacerbation, continue with bronchodilator therapy.

## 2023-12-04 NOTE — TOC Progression Note (Signed)
Transition of Care [facility]) - Progression Note    Patient Details  Name: Sandra Kaufman MRN: [id number] Date of Birth: 01/24/1932  [facility]) CM/SW Contact  Liliana Cline, LCSW Phone Number: 12/04/2023, 10:32 AM  Clinical Narrative:    Patient is ready to DC. CSW called [facility] spoke with Barrington Ellison. Asher Muir states she was told that patient cannot return until assessed by Ed or Marianna Fuss - and states that they will not be able to come assess the patient until Monday.        Expected Discharge Plan and Services                                               Social Determinants of Health (SDOH) Interventions SDOH Screenings   Tobacco Use: Low Risk  (12/02/2023)    Readmission Risk Interventions     No data to display

## 2023-12-04 NOTE — Assessment & Plan Note (Addendum)
Patient with multiple falls, poor prognosis.  Baseline end-stage dementia At memory care unit Continue memantine, sertraline and quetiapine.  As needed lorazepam.

## 2023-12-04 NOTE — Progress Notes (Addendum)
Progress Note   Patient: Sandra Kaufman ZDG:[id number] DOB: [birth year] DOA: 12/02/2023     2 DOS: the patient was seen and examined on 12/04/2023   Brief hospital course: Sandra Kaufman was admitted to the hospital with the working diagnosis of left ankle acute on chronic fracture.   89 y.o. female with medical history significant of multiple medical issues including history of CVA, COPD, end-stage dementia, hypertension, hypothyroidism presenting with left lower extremity wound from her memory care unit.  Limited history due to end-stage dementia.  No reported falls or trauma to the affected area.  Unclear duration.  On presentation hemodynamically stable, no leukocytosis, hemoglobin 9.7, creatinine 0.92, glucose 174.   Left tib-fib showing displaced medial malleoli are and posterior distal tibial fracture with disruption of the ankle mortise and irregular appearance of the fracture lucency with concern for osteomyelitis. Also with oblique fracture of the proximal fibular diaphysis and one half shaft width anterior displacement. Diffuse soft tissue swelling concerning for cellulitis.  Lower extremity ultrasound negative for DVT.  Per PA Poggi, case discussed with Dr. Annamary Rummage with podiatry. Recommending MRI of the foot and ankle to better assess. Started on broad-spectrum antibiotics in the ER.   12/6: Vital and labs stable, mild hypokalemia with potassium at 3.4.  And hypomagnesemia which are being repleted.  Elevated CRP at 6 and ESR at 75, prealbumin low at 13.  Unable to obtain MRI due to presence of unknown bladder stimulator.  Discussed with podiatry they does not think that it is osteo but recommending obtaining CT scan of ankle which was ordered.  Likely an acute on chronic ankle fracture. Significant edema and bruising of left lower extremity and foot. Not a surgical candidate, podiatry is recommending cam boot and nonweightbearing on left lower extremity.  Based on CT ankle results  we can ask ID for their recommendations if need to treat as osteo.  She had a very shallow small ulcer without any significant sign of infection on anterior side of ankle.  12/06 left ankle CT with acute or subacute trimalleolar fracture of the left ankle, with associated widening of the ankle mortise and lateral subluxation of the talus relative to the tibial plafond.  No definite signs of superimposed osteomyelitis.  Moderate soft tissue swelling surrounding the ankle with extension into the dorsal aspect of the foot. No evidence of focal fluid collection, foreign body or soft tissue emphysema.   12/07 plan to return to the memory unit, to continue with conservative management for pain, cam boot and NWB on LE.    Assessment and Plan: * Non-healing wound of left lower extremity No clinical signs of osteomyelitis. She has been afebrile, and no leukocytosis.  Blood cultures with no growth.   Plan to continue local wound care with: Xerform over wounds and ace wrap over the left foot and lower leg per podiatry recommendations.  Will continue short course of antibiotic therapy with Augmentin for superficial cellulitis.  Osteomyelitis has been ruled out.   Hypokalemia Hypomagnesemia.   Patient received Kcl and Mag sulfate for electrolyte correction. Follow up renal function and electrolytes as outpatient.   Closed displaced fracture of medial malleolus of left tibia Trimalleolar fracture of left ankle.  Plan to continue pain control, CAM boot to LEE.  WB NWB to LLE.  Follow up as outpatient with podiatry.  Continue pain control with oral analgesics.   Multiple falls Fall precautions  Dementia [facility]) Patient with multiple falls, poor prognosis.  Baseline end-stage dementia At memory  care unit Continue memantine, sertraline and quetiapine.  As needed lorazepam.    Chronic obstructive pulmonary disease (HCC) No signs of acute exacerbation, continue with bronchodilator therapy.    Hypertension Blood pressure has been stable, at the time of discharge will continue with metoprolol and amlodipine.    Type 2 diabetes mellitus with hyperlipidemia (HCC) Patient was placed on insulin sliding scale for glucose cover and monitoring . Her glucose remained stable with a discharge capillary glucose of 135 mg/dl.  Continue with Januvia.   Continue with statin therapy.   Hypothyroid, plan to continue with levothyroxine,   GERD (gastroesophageal reflux disease) Continue with pantoprazole.   Electrolyte abnormality Mild hypokalemia with potassium at 3.4 and hypomagnesemia with magnesium at 1.4. -Replete electrolytes and monitor        Subjective: Patient with no signs of pain at rest, she is confused and not able to provide any history   Physical Exam: Vitals:   12/03/23 0848 12/03/23 2311 12/04/23 0412 12/04/23 0759  BP: (!) 98/58 (!) 129/103 106/86 114/78  Pulse: 86 85 95 84  Resp:  17 16 16   Temp: 98 F (36.7 C) 98.4 F (36.9 C) 99.1 F (37.3 C) 98.6 F (37 C)  TempSrc: Oral Oral Axillary Oral  SpO2: 97% 100% 95% 98%  Weight:      Height:       Neurology awake and alert, confuses and disorientated ENT with mild pallor Cardiovascular with S1 and S2 present and regular Respiratory with no wheezing or rhonchi Abdomen with no distention  Left lower extremity with local edema, wrap in place with mild toe edema  Data Reviewed:    Family Communication: no family at the bedside   Disposition: Status is: Inpatient Remains inpatient appropriate because: Patient not able to return to memory unit until Monday   Planned Discharge Destination: Skilled nursing facility     Author: Coralie Keens, MD 12/04/2023 10:35 AM  For on call review [email or web address].

## 2023-12-04 NOTE — Assessment & Plan Note (Addendum)
Patient was placed on insulin sliding scale for glucose cover and monitoring . Her glucose remained stable with a discharge capillary glucose of 140 mg/dl.  Continue with Januvia.   Continue with statin therapy.   Hypothyroid, plan to continue with levothyroxine,

## 2023-12-05 DIAGNOSIS — S81802A Unspecified open wound, left lower leg, initial encounter: Secondary | ICD-10-CM | POA: Diagnosis not present

## 2023-12-05 DIAGNOSIS — E876 Hypokalemia: Secondary | ICD-10-CM | POA: Diagnosis not present

## 2023-12-05 DIAGNOSIS — S8252XA Displaced fracture of medial malleolus of left tibia, initial encounter for closed fracture: Secondary | ICD-10-CM | POA: Diagnosis not present

## 2023-12-05 DIAGNOSIS — G309 Alzheimer's disease, unspecified: Secondary | ICD-10-CM | POA: Diagnosis not present

## 2023-12-05 LAB — GLUCOSE, CAPILLARY
Glucose-Capillary: 119 mg/dL — ABNORMAL HIGH (ref 70–99)
Glucose-Capillary: 124 mg/dL — ABNORMAL HIGH (ref 70–99)
Glucose-Capillary: 127 mg/dL — ABNORMAL HIGH (ref 70–99)
Glucose-Capillary: 140 mg/dL — ABNORMAL HIGH (ref 70–99)
Glucose-Capillary: 147 mg/dL — ABNORMAL HIGH (ref 70–99)
Glucose-Capillary: 148 mg/dL — ABNORMAL HIGH (ref 70–99)

## 2023-12-05 NOTE — Plan of Care (Signed)

## 2023-12-05 NOTE — Progress Notes (Addendum)
Progress Note   Patient: Sandra Kaufman NWG:[id number] DOB: 26-Mar-1932 DOA: 12/02/2023     3 DOS: the patient was seen and examined on 12/05/2023   Brief hospital course: Mrs Rieben was admitted to the hospital with the working diagnosis of left ankle acute on chronic fracture.   89 y.o. female with medical history significant of multiple medical issues including history of CVA, COPD, end-stage dementia, hypertension, hypothyroidism presenting with left lower extremity wound from her memory care unit.  Limited history due to end-stage dementia.  No reported falls or trauma to the affected area.  Unclear duration.  On presentation hemodynamically stable, no leukocytosis, hemoglobin 9.7, creatinine 0.92, glucose 174.   Left tib-fib showing displaced medial malleoli are and posterior distal tibial fracture with disruption of the ankle mortise and irregular appearance of the fracture lucency with concern for osteomyelitis. Also with oblique fracture of the proximal fibular diaphysis and one half shaft width anterior displacement. Diffuse soft tissue swelling concerning for cellulitis.  Lower extremity ultrasound negative for DVT.  Per PA Poggi, case discussed with Dr. Annamary Rummage with podiatry. Recommending MRI of the foot and ankle to better assess. Started on broad-spectrum antibiotics in the ER.   12/6: Vital and labs stable, mild hypokalemia with potassium at 3.4.  And hypomagnesemia which are being repleted.  Elevated CRP at 6 and ESR at 75, prealbumin low at 13.  Unable to obtain MRI due to presence of unknown bladder stimulator.  Discussed with podiatry they does not think that it is osteo but recommending obtaining CT scan of ankle which was ordered.  Likely an acute on chronic ankle fracture. Significant edema and bruising of left lower extremity and foot. Not a surgical candidate, podiatry is recommending cam boot and nonweightbearing on left lower extremity.  Based on CT ankle results  we can ask ID for their recommendations if need to treat as osteo.  She had a very shallow small ulcer without any significant sign of infection on anterior side of ankle.  12/06 left ankle CT with acute or subacute trimalleolar fracture of the left ankle, with associated widening of the ankle mortise and lateral subluxation of the talus relative to the tibial plafond.  No definite signs of superimposed osteomyelitis.  Moderate soft tissue swelling surrounding the ankle with extension into the dorsal aspect of the foot. No evidence of focal fluid collection, foreign body or soft tissue emphysema.   12/07 plan to return to the memory unit, to continue with conservative management for pain, cam boot and NWB on LE.  12/08 patient continue stable to be transfer to memory unit.   Assessment and Plan: * Non-healing wound of left lower extremity No clinical signs of osteomyelitis. She has been afebrile, and no leukocytosis.  Blood cultures with no growth.   Plan to continue local wound care with: Xerform over wounds and ace wrap over the left foot and lower leg per podiatry recommendations.  Will continue short course of antibiotic therapy with Augmentin for superficial cellulitis.  Osteomyelitis has been ruled out.   Hypokalemia Hypomagnesemia.   Patient received Kcl and Mag sulfate for electrolyte correction. Follow up renal function and electrolytes as outpatient.   Closed displaced fracture of medial malleolus of left tibia Trimalleolar fracture of left ankle.  Plan to continue pain control, CAM boot to LEE.  WB NWB to LLE.  Follow up as outpatient with podiatry.  Continue pain control with oral analgesics.   Dementia [facility]) Patient with multiple falls, poor prognosis.  Baseline end-stage dementia At memory care unit Continue memantine, sertraline and quetiapine.  As needed lorazepam.    Chronic obstructive pulmonary disease (HCC) No signs of acute exacerbation, continue with  bronchodilator therapy.   Hypertension Blood pressure has been stable, at the time of discharge will continue with metoprolol and amlodipine.    Type 2 diabetes mellitus with hyperlipidemia (HCC) Patient was placed on insulin sliding scale for glucose cover and monitoring . Her glucose remained stable with a discharge capillary glucose of 140 mg/dl.  Continue with Januvia.   Continue with statin therapy.   Hypothyroid, plan to continue with levothyroxine,   GERD (gastroesophageal reflux disease) Continue with pantoprazole.    Subjective: Patient continue with sable condition, she is not complaining of pain or dyspnea. Pending transfer to memory unit/   Physical Exam: Vitals:   12/04/23 1541 12/04/23 2024 12/05/23 0317 12/05/23 0805  BP: 133/83 135/86 109/68 105/82  Pulse: 86 80 78 77  Resp: 18 16 12    Temp: 98.4 F (36.9 C) 99.1 F (37.3 C) (!) 97.5 F (36.4 C) 98 F (36.7 C)  TempSrc: Oral Oral Oral Oral  SpO2: 96% 96% 97% 96%  Weight:      Height:       Neurology awake and alert, confused and disorientated, not agitated ENT with mild pallor Cardiovascular with S1 and S2 present and regular Respiratory with no wheezing Abdomen with no distention  Left lower extremity edema  Data Reviewed:    Family Communication: no family at the bedside   Disposition: Status is: Inpatient Remains inpatient appropriate because: pending transfer to memory unit.   Planned Discharge Destination: Skilled nursing facility     Author: Coralie Keens, MD 12/05/2023 3:09 PM  For on call review [email or web address].

## 2023-12-05 NOTE — TOC Progression Note (Signed)
Transition of Care [facility]) - Progression Note    Patient Details  Name: Sandra Kaufman MRN: [id number] Date of Birth: Sep 30, 1932  Transition [facility]) CM/SW Contact  Liliana Cline, LCSW Phone Number: 12/05/2023, 9:02 AM  Clinical Narrative:    CSW left a VM for Ed at [facility] requesting a return call on what time they plan to come assess the patient tomorrow. Left weekday RNCM number for follow up.         Expected Discharge Plan and Services                                               Social Determinants of Health (SDOH) Interventions SDOH Screenings   Tobacco Use: Low Risk  (12/02/2023)    Readmission Risk Interventions     No data to display

## 2023-12-06 ENCOUNTER — Inpatient Hospital Stay: Payer: Medicare Other

## 2023-12-06 DIAGNOSIS — S81802A Unspecified open wound, left lower leg, initial encounter: Secondary | ICD-10-CM | POA: Diagnosis not present

## 2023-12-06 LAB — GLUCOSE, CAPILLARY
Glucose-Capillary: 122 mg/dL — ABNORMAL HIGH (ref 70–99)
Glucose-Capillary: 134 mg/dL — ABNORMAL HIGH (ref 70–99)
Glucose-Capillary: 118 mg/dL — ABNORMAL HIGH (ref 70–99)
Glucose-Capillary: 139 mg/dL — ABNORMAL HIGH (ref 70–99)
Glucose-Capillary: 132 mg/dL — ABNORMAL HIGH (ref 70–99)

## 2023-12-06 MED ORDER — BISACODYL 10 MG RE SUPP
10.0000 mg | Freq: Every day | RECTAL | Status: DC | PRN
  Administered 2023-12-06: 10 mg via RECTAL
  Filled 2023-12-06: qty 1

## 2023-12-06 NOTE — TOC Progression Note (Signed)
Transition of Care [facility]) - Progression Note    Patient Details  Name: Sandra Kaufman MRN: [id number] Date of Birth: [birth year]  Transition of Care [facility]) CM/SW Contact  Sandra Fitch, RN Phone Number: 12/06/2023, 11:51 AM  Clinical Narrative:        ED at [facility] to come to hospital to complete assessment at 230pm  Per Ed they have been working with patients SW Sandra Kaufman at [facility] [phone] for placement at a higher level of care  Per Sandra Kaufman patient was an active hospice patient prior to admission, but was closed at admission due to their policies   Spoke with son Sandra Kaufman he is agreeable to SNF bed seach with hospice services.  He prefers [facility], but is in agreement to extend search   Existing pasrr Fl2 sent for [employer] search initiated      Expected Discharge Plan and Services                                               Social Determinants of Health (SDOH) Interventions SDOH Screenings   Tobacco Use: Low Risk  (12/02/2023)    Readmission Risk Interventions     No data to display

## 2023-12-06 NOTE — [place] FL2 (Signed)
[place] MEDICAID FL2 LEVEL OF CARE FORM     IDENTIFICATION  Patient Name: Sandra Kaufman [id number] Birthdate: [birth year] Sex: female Admission Date (Current Location): 12/02/2023  [facility] and [place] Number:  Chiropodist and Address:         Provider Number: [id number]  Attending Physician Name and Address:  Lanae Boast, MD  Relative Name and Phone Number:       Current Level of Care: Hospital Recommended Level of Care: Nursing Facility, Other (Comment) (with hospice) Prior Approval Number:    Date Approved/Denied:   PASRR Number: [id number]  Discharge Plan: SNF    Current Diagnoses: Patient Active Problem List   Diagnosis Date Noted   Hypokalemia 12/04/2023   Closed displaced fracture of medial malleolus of left tibia 12/03/2023   Non-healing wound of left lower extremity 12/02/2023   Ileus (HCC) 08/27/2016   Dementia (HCC) 04/22/2016   Fracture of femoral neck, left (HCC) 04/16/2016   Closed fracture of neck of left femur, initial encounter (HCC) 04/16/2016   Vomiting 03/17/2016   Rash 03/17/2016   Diarrhea 02/18/2016   Adult hypothyroidism 01/29/2016   Fall 01/19/2016   Anemia 10/20/2015   Headache, migraine 10/10/2015   HLD (hyperlipidemia) 10/10/2015   Glaucoma 10/10/2015   Chronic obstructive pulmonary disease (HCC) 10/10/2015   Arthritis 10/10/2015   Hypertrophic toenail 07/23/2015   Health care maintenance 05/27/2015   Difficulty in walking 10/04/2014   Cerebral ataxia (HCC) 10/04/2014   Unsteady gait 08/22/2014   Obesity 08/22/2014   Dysphagia 07/14/2014   Amnesia 05/24/2014   B12 deficiency 05/24/2014   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   SOB (shortness of breath) 05/06/2014   CVA (cerebral vascular accident) (HCC) 03/04/2014   Diverticulosis 04/29/2013   GERD (gastroesophageal reflux disease) 11/27/2012   Urinary incontinence 11/27/2012   Ovarian cyst 11/27/2012   Depression 11/27/2012   Neuropathy 11/27/2012   Hypothyroidism  11/15/2012   Hypercholesterolemia 11/15/2012   Type 2 diabetes mellitus with hyperlipidemia (HCC) 11/15/2012   Hypertension 11/15/2012    Orientation RESPIRATION BLADDER Height & Weight     Self  Normal Incontinent Weight: 66.7 kg (bed weight) Height:  5' 2.99" (160 cm)  BEHAVIORAL SYMPTOMS/MOOD NEUROLOGICAL BOWEL NUTRITION STATUS      Continent Diet (Carb modified)  AMBULATORY STATUS COMMUNICATION OF NEEDS Skin   Extensive Assist Verbally Other (Comment), Bruising (Xerform to anteior ankle over wound, wrap foot and ankle with ace wrap for compression/edema control)                       Personal Care Assistance Level of Assistance              Functional Limitations Info             SPECIAL CARE FACTORS FREQUENCY                       Contractures Contractures Info: Not present    Additional Factors Info  Code Status, Allergies Code Status Info: DNR Allergies Info: tramadol           Current Medications (12/06/2023):  This is the current hospital active medication list Current Facility-Administered Medications  Medication Dose Route Frequency Provider Last Rate Last Admin   amoxicillin-clavulanate (AUGMENTIN) 400-57 MG/5ML suspension 400 mg  400 mg Oral Q12H Arrien, York Ram, MD   400 mg at 12/06/23 1138   bisacodyl (DULCOLAX) suppository 10 mg  10 mg Rectal Daily PRN Lanae Boast, MD  10 mg at 12/06/23 1138   busPIRone (BUSPAR) tablet 5 mg  5 mg Oral Daily Arnetha Courser, MD   5 mg at 12/06/23 0845   enoxaparin (LOVENOX) injection 40 mg  40 mg Subcutaneous Q24H Floydene Flock, MD   40 mg at 12/06/23 0010   fluticasone (FLONASE) 50 MCG/ACT nasal spray 2 spray  2 spray Each Nare Daily Arnetha Courser, MD   2 spray at 12/06/23 0846   insulin aspart (novoLOG) injection 0-5 Units  0-5 Units Subcutaneous QHS Amin, Tilman Neat, MD       insulin aspart (novoLOG) injection 0-9 Units  0-9 Units Subcutaneous TID WC Arnetha Courser, MD   1 Units at 12/06/23 0847    latanoprost (XALATAN) 0.005 % ophthalmic solution 1 drop  1 drop Both Eyes QHS Arrien, York Ram, MD   1 drop at 12/06/23 0010   levothyroxine (SYNTHROID) tablet 100 mcg  100 mcg Oral Daily Arnetha Courser, MD   100 mcg at 12/06/23 2355   LORazepam (ATIVAN) tablet 0.5 mg  0.5 mg Oral TID PRN Arrien, York Ram, MD       memantine [facility]) tablet 10 mg  10 mg Oral BID Arnetha Courser, MD   10 mg at 12/06/23 0845   ondansetron (ZOFRAN) tablet 4 mg  4 mg Oral Q6H PRN Floydene Flock, MD       Or   ondansetron [facility]) injection 4 mg  4 mg Intravenous Q6H PRN Floydene Flock, MD       oxyCODONE (Oxy IR/ROXICODONE) immediate release tablet 5 mg  5 mg Oral QID PRN Arnetha Courser, MD   5 mg at 12/06/23 0255   pantoprazole (PROTONIX) EC tablet 40 mg  40 mg Oral Daily Floydene Flock, MD   40 mg at 12/06/23 0845   QUEtiapine (SEROQUEL) tablet 25 mg  25 mg Oral BID Arnetha Courser, MD   25 mg at 12/06/23 0845   sertraline (ZOLOFT) tablet 200 mg  200 mg Oral Daily Arnetha Courser, MD   200 mg at 12/06/23 0845     Discharge Medications: Please see discharge summary for a list of discharge medications.  Relevant Imaging Results:  Relevant Lab Results:   Additional Information ss [id number]  Chapman Fitch, RN

## 2023-12-06 NOTE — Plan of Care (Signed)

## 2023-12-06 NOTE — Discharge Summary (Incomplete)
Physician Discharge Summary  Madelynn Shostak Stlaurent GNF:[id number] DOB: 22-Jan-1932 DOA: 12/02/2023  PCP: [facility]  Admit date: 12/02/2023 Discharge date: 12/06/2023 Recommendations for Outpatient Follow-up:  Follow up with PCP in 1 weeks-call for appointment Please obtain BMP/CBC in one week  Discharge Dispo: Memory care facility Discharge Condition: Stable Code Status:   Code Status: Limited: Do not attempt resuscitation (DNR) -DNR-LIMITED -Do Not Intubate/DNI  Diet recommendation:  Diet Order             Diet Carb Modified Fluid consistency: Thin; Room service appropriate? Yes  Diet effective now                    Brief/Interim Summary: Mrs Carreira was admitted to the hospital with the working diagnosis of left ankle acute on chronic fracture.   89 y.o. female with medical history significant of multiple medical issues including history of CVA, COPD, end-stage dementia, hypertension, hypothyroidism presenting with left lower extremity wound from her memory care unit.  Limited history due to end-stage dementia.  No reported falls or trauma to the affected area.  Unclear duration.  On presentation hemodynamically stable, no leukocytosis, hemoglobin 9.7, creatinine 0.92, glucose 174.   Left tib-fib showing displaced medial malleoli are and posterior distal tibial fracture with disruption of the ankle mortise and irregular appearance of the fracture lucency with concern for osteomyelitis. Also with oblique fracture of the proximal fibular diaphysis and one half shaft width anterior displacement. Diffuse soft tissue swelling concerning for cellulitis.  Lower extremity ultrasound negative for DVT.  Per PA Poggi, case discussed with Dr. Annamary Rummage with podiatry. Recommending MRI of the foot and ankle to better assess. Started on broad-spectrum antibiotics in the ER.  Elevated CRP at 6 and ESR at 75, prealbumin low at 13.  Unable to obtain MRI due to presence of unknown  bladder stimulator.  Discussed with podiatry they does not think that it is osteo but recommending obtaining CT scan of ankle which was ordered.  Likely an acute on chronic ankle fracture. Significant edema and bruising of left lower extremity and foot. Not a surgical candidate, podiatry is recommending cam boot and nonweightbearing on left lower extremity. Based on CT ankle results we can ask ID for their recommendations if need to treat as osteo.  She had a very shallow small ulcer without any significant sign of infection on anterior side of ankle. 12/6 left ankle CT with acute or subacute trimalleolar fracture of the left ankle, with associated widening of the ankle mortise and lateral subluxation of the talus relative to the tibial plafond.  No definite signs of superimposed osteomyelitis.  Moderate soft tissue swelling surrounding the ankle with extension into the dorsal aspect of the foot. No evidence of focal fluid collection, foreign body or soft tissue emphysema.  12/07 plan to return to the memory unit, to continue with conservative management for pain, cam boot and NWB on LE.  Has been waiting for return to memory care and has been stable otherwise     Discharge Diagnoses:  Principal Problem:   Non-healing wound of left lower extremity Active Problems:   Hypokalemia   Closed displaced fracture of medial malleolus of left tibia   Dementia (HCC)   Chronic obstructive pulmonary disease (HCC)   Hypertension   Type 2 diabetes mellitus with hyperlipidemia (HCC)   GERD (gastroesophageal reflux disease)  Non-healing wound of left lower extremity No clinical signs of osteomyelitis.  Overall afebrile no leukocytosis blood culture no growth.  Continue current local wound care with Xeroform over the wound send Ace wrap of left foot and lower leg per podiatry.  On short course of antibiotics and Augmentin for superficial cellulitis.  Hypokalemia Hypomagnesemia: Resolved.  Closed displaced  fracture of medial malleolus of left tibia Trimalleolar fracture of left ankle.: Seen by podiatry continue cam boot pain control WB NWB to LLE.  Follow up as outpatient with podiatry.   End-stage dementia Debility deconditioning failure to thrive Patient with multiple falls, poor prognosis, continue supportive care.  Baseline end-stage dementia At memory care unit currently continue her memantine, sertraline and quetiapine prn ativan  COPDl No signs of acute exacerbation, continue with bronchodilator therapy.   Hypertension Stable. At discharge will continue with metoprolol and amlodipine.    Type 2 diabetes mellitus with hyperlipidemia: Continue sliding scale insulin Januvia statin  Hypothyroid, plan to continue with levothyroxine,   GERD: Cont ppi  Consults: Podiatry, Subjective: Alert awake. Not following commands.  Discharge Exam: Vitals:   12/06/23 0202 12/06/23 0738  BP: 104/74 115/74  Pulse: (!) 101 81  Resp: 16   Temp: 98.4 F (36.9 C) 99.1 F (37.3 C)  SpO2: 95% 96%   General: Pt is alert, awake, not in acute distress Cardiovascular: RRR, S1/S2 +, no rubs, no gallops Respiratory: CTA bilaterally, no wheezing, no rhonchi Abdominal: Soft, NT, ND, bowel sounds + Extremities: no edema, no cyanosis  Discharge Instructions   Allergies as of 12/06/2023       Reactions   Tramadol Nausea And Vomiting     Med Rec must be completed prior to using this SMART[employer]***       Allergies  Allergen Reactions   Tramadol Nausea And Vomiting    The results of significant diagnostics from this hospitalization (including imaging, microbiology, ancillary and laboratory) are listed below for reference.    Microbiology: Recent Results (from the past 240 hour(s))  Culture, blood (Routine X 2) w Reflex to ID Panel     Status: None (Preliminary result)   Collection Time: 12/02/23  7:38 PM   Specimen: BLOOD  Result Value Ref Range Status   Specimen Description BLOOD  BLOOD LEFT HAND  Final   Special Requests   Final    BOTTLES DRAWN AEROBIC AND ANAEROBIC Blood Culture results may not be optimal due to an excessive volume of blood received in culture bottles   Culture   Final    NO GROWTH 4 DAYS Performed at [facility], [address] [place], [place] [address]    Report Status PENDING  Incomplete  Culture, blood (Routine X 2) w Reflex to ID Panel     Status: None (Preliminary result)   Collection Time: 12/03/23  8:58 AM   Specimen: BLOOD  Result Value Ref Range Status   Specimen Description BLOOD RAC  Final   Special Requests   Final    BOTTLES DRAWN AEROBIC AND ANAEROBIC Blood Culture adequate volume   Culture   Final    NO GROWTH 3 DAYS Performed at [facility], [address] [place], [place] [address]    Report Status PENDING  Incomplete    Procedures/Studies: CT ANKLE LEFT WO CONTRAST  Result Date: 12/04/2023 CLINICAL DATA:  Ankle fracture. Evaluate for acuity and superimposed osteomyelitis. EXAM: CT OF THE LEFT ANKLE WITHOUT CONTRAST TECHNIQUE: Multidetector CT imaging of the left ankle was performed according to the standard protocol. Multiplanar CT image reconstructions were also generated. RADIATION DOSE REDUCTION: This exam was performed according to the  departmental dose-optimization program which includes automated exposure control, adjustment of the mA and/or kV according to patient size and/or use of iterative reconstruction technique. COMPARISON:  Radiographs 12/02/2023. FINDINGS: Bones/Joint/Cartilage The bones are diffusely demineralized. There is a trimalleolar fracture associated with widening of the ankle mortise and lateral subluxation of the talus relative to the tibial plafond. Transverse fracture through the base of the medial malleolus is mildly comminuted and displaced up to 9 mm laterally. Posterior malleolar fracture is also mildly comminuted and mildly displaced proximally and laterally.  Intra-articular fracture of the anterolateral aspect of the distal tibia at the tibiofibular joint demonstrates up to 10 mm of anterolateral displacement. There is a nondisplaced oblique fracture of the distal fibular metaphysis. All of these fractures appear acute or subacute. No definite fracture healing. No definite signs of superimposed osteomyelitis. No evidence of tarsal bone fracture or dislocation. Small ankle joint effusion with small intra-articular fracture fragments. Ligaments Suboptimally assessed by CT. Muscles and Tendons As evaluated by CT, the ankle tendons appear intact without significant tenosynovitis. The posterior tibialis and flexor digitorum longus tendons are in close proximity to the comminuted fractures of the posteromedial distal tibia. No focal muscular abnormalities are identified. Soft tissues Moderate soft tissue swelling surrounding the ankle with extension into the dorsal aspect of the foot. No evidence of focal fluid collection, foreign body or soft tissue emphysema. Scattered arterial calcifications are noted. IMPRESSION: 1. Acute or subacute trimalleolar fracture of the left ankle as described with associated widening of the ankle mortise and lateral subluxation of the talus relative to the tibial plafond. 2. No definite signs of superimposed osteomyelitis. Correlate clinically. 3. Moderate soft tissue swelling surrounding the ankle with extension into the dorsal aspect of the foot. No evidence of focal fluid collection, foreign body or soft tissue emphysema. Electronically Signed   By: Carey Bullocks M.D.   On: 12/04/2023 07:29   DG Ankle Complete Left  Result Date: 12/02/2023 CLINICAL DATA:  Known ankle fracture EXAM: LEFT ANKLE COMPLETE - 3 VIEW COMPARISON:  Earlier same day tibia and fibula radiographs FINDINGS: Again seen are displaced medial malleolar and posterior distal tibial fractures with disruption of the ankle mortise. There is 7 mm medial displacement of the  medial malleolar fracture fragment associated with widening of the medial ankle mortise. The posterior distal tibial fracture fragment is distracted by approximately 3 mm. Again seen is amorphous radiodensity projecting along the medial malleolar fracture. A subtle transversely oriented lucency is seen through the distal fibular diaphysis. Diffuse soft tissue swelling about the ankle. IMPRESSION: 1. Displaced medial malleolar and posterior distal tibial fractures with disruption of the ankle mortise. Amorphous radiodensities are again seen, suggestive of interval healing, however osteomyelitis remains within the differential. 2. Subtle transversely oriented lucency through the distal fibular diaphysis may reflect a nondisplaced fracture. Electronically Signed   By: Agustin Cree M.D.   On: 12/02/2023 16:49   DG Tibia/Fibula Left  Result Date: 12/02/2023 CLINICAL DATA:  Cellulitis and bruising EXAM: LEFT TIBIA AND FIBULA - 2 VIEW COMPARISON:  None Available. FINDINGS: Oblique fracture of the proximal fibular diaphysis with 1/2 shaft width anterior displacement. Findings of bony bridging. Displaced medial malleolar and posterior distal tibial fracture with disruption of the ankle mortise. Irregular appearance of the fracture lucency with amorphous calcification along the medial malleolus. Diffuse osteopenia. Diffuse soft tissue reticulations of the leg. IMPRESSION: 1. Displaced medial malleolar and posterior distal tibial fracture with disruption of the ankle mortise. Irregular appearance of the fracture lucency  with amorphous calcification along the medial malleolus, which may be related to healing changes, however osteomyelitis is not excluded. 2. Oblique fracture of the proximal fibular diaphysis with 1/2 shaft width anterior displacement. Findings of bony bridging, suggestive of subacute fracture. 3. Diffuse soft tissue reticulations of the leg, which may be seen in the setting of cellulitis. Electronically Signed    By: Agustin Cree M.D.   On: 12/02/2023 14:45   US Venous Img Lower  Left (DVT Study)  Result Date: 12/02/2023 CLINICAL DATA:  Swelling EXAM: LEFT LOWER EXTREMITY VENOUS DOPPLER ULTRASOUND TECHNIQUE: Gray-scale sonography with compression, as well as color and duplex ultrasound, were performed to evaluate the deep venous system(s) from the level of the common femoral vein through the popliteal and proximal calf veins. COMPARISON:  None available FINDINGS: VENOUS Normal compressibility of the common femoral, superficial femoral, and popliteal veins, as well as the visualized calf veins. Visualized portions of profunda femoral vein and great saphenous vein unremarkable. No filling defects to suggest DVT on grayscale or color Doppler imaging. Doppler waveforms show normal direction of venous flow, normal respiratory plasticity and response to augmentation. Limited views of the contralateral common femoral vein are unremarkable. OTHER None. Limitations: none IMPRESSION: No DVT of the left lower extremity. Electronically Signed   By: Acquanetta Belling M.D.   On: 12/02/2023 14:02    Labs: BNP (last 3 results) No results for input(s): "BNP" in the last 8760 hours. Basic Metabolic Panel: Recent Labs  Lab 12/02/23 1118 12/02/23 1938 12/03/23 0542 12/03/23 0858  NA 135  --  137  --   K 4.0  --  3.4*  --   CL 101  --  104  --   CO2 26  --  23  --   GLUCOSE 174*  --  140*  --   BUN 16  --  14  --   CREATININE 0.92 0.74 0.72  --   CALCIUM 8.4*  --  8.0*  --   MG  --   --   --  1.4*   Liver Function Tests: Recent Labs  Lab 12/03/23 0542  AST 12*  ALT 10  ALKPHOS 67  BILITOT 1.4*  PROT 6.0*  ALBUMIN 2.9*   No results for input(s): "LIPASE", "AMYLASE" in the last 168 hours. No results for input(s): "AMMONIA" in the last 168 hours. CBC: Recent Labs  Lab 12/02/23 1118 12/02/23 1938 12/03/23 0542  WBC 9.5 7.7 6.6  HGB 9.7* 8.8* 8.2*  HCT 29.6* 26.6* 24.8*  MCV 92.2 93.0 93.6  PLT 297 231 194    Cardiac Enzymes: No results for input(s): "CKTOTAL", "CKMB", "CKMBINDEX", "TROPONINI" in the last 168 hours. BNP: Invalid input(s): "POCBNP" CBG: Recent Labs  Lab 12/05/23 0806 12/05/23 1145 12/05/23 1545 12/05/23 2233 12/06/23 0739  GLUCAP 140* 147* 127* 124* 134*   D-Dimer No results for input(s): "DDIMER" in the last 72 hours. Hgb A1c No results for input(s): "HGBA1C" in the last 72 hours. Lipid Profile No results for input(s): "CHOL", "HDL", "LDLCALC", "TRIG", "CHOLHDL", "LDLDIRECT" in the last 72 hours. Thyroid function studies No results for input(s): "TSH", "T4TOTAL", "T3FREE", "THYROIDAB" in the last 72 hours.  Invalid input(s): "FREET3" Anemia work up No results for input(s): "VITAMINB12", "FOLATE", "FERRITIN", "TIBC", "IRON", "RETICCTPCT" in the last 72 hours. Urinalysis    Component Value Date/Time   COLORURINE YELLOW (A) 04/30/2022 0850   APPEARANCEUR CLEAR (A) 04/30/2022 0850   APPEARANCEUR Cloudy 02/10/2014 1230   LABSPEC 1.015 04/30/2022 0850  LABSPEC 1.016 02/10/2014 1230   PHURINE 6.0 04/30/2022 0850   GLUCOSEU NEGATIVE 04/30/2022 0850   GLUCOSEU Negative 02/10/2014 1230   GLUCOSEU NEGATIVE 01/04/2014 1218   HGBUR NEGATIVE 04/30/2022 0850   BILIRUBINUR NEGATIVE 04/30/2022 0850   BILIRUBINUR neg 12/25/2015 0857   BILIRUBINUR Negative 02/10/2014 1230   KETONESUR NEGATIVE 04/30/2022 0850   PROTEINUR NEGATIVE 04/30/2022 0850   UROBILINOGEN 1.0 12/25/2015 0857   UROBILINOGEN 0.2 01/04/2014 1218   NITRITE NEGATIVE 04/30/2022 0850   LEUKOCYTESUR NEGATIVE 04/30/2022 0850   LEUKOCYTESUR Negative 02/10/2014 1230   Sepsis Labs Recent Labs  Lab 12/02/23 1118 12/02/23 1938 12/03/23 0542  WBC 9.5 7.7 6.6   Microbiology Recent Results (from the past 240 hour(s))  Culture, blood (Routine X 2) w Reflex to ID Panel     Status: None (Preliminary result)   Collection Time: 12/02/23  7:38 PM   Specimen: BLOOD  Result Value Ref Range Status   Specimen  Description BLOOD BLOOD LEFT HAND  Final   Special Requests   Final    BOTTLES DRAWN AEROBIC AND ANAEROBIC Blood Culture results may not be optimal due to an excessive volume of blood received in culture bottles   Culture   Final    NO GROWTH 4 DAYS Performed at [facility], [address] [place], [place] [address]    Report Status PENDING  Incomplete  Culture, blood (Routine X 2) w Reflex to ID Panel     Status: None (Preliminary result)   Collection Time: 12/03/23  8:58 AM   Specimen: BLOOD  Result Value Ref Range Status   Specimen Description BLOOD RAC  Final   Special Requests   Final    BOTTLES DRAWN AEROBIC AND ANAEROBIC Blood Culture adequate volume   Culture   Final    NO GROWTH 3 DAYS Performed at [facility], [address], [place], [place] [address]    Report Status PENDING  Incomplete     Time coordinating discharge: 25 minutes  SIGNED: Lanae Boast, MD  [facility] 12/06/2023, 8:53 AM  If 7PM-7AM, please contact night-coverage [email or web address]

## 2023-12-06 NOTE — Progress Notes (Signed)
Patient is pocketing foods and meds in cheeks. Crushed meds in applesauce, required encouragement for multiple swallows to get meds swallowed.   Sandra Kaufman

## 2023-12-06 NOTE — Progress Notes (Signed)
PROGRESS NOTE Sandra Kaufman  HYQ:[id number] DOB: 08-30-1932 DOA: 12/02/2023 PCP: Sandra Kaufman, [facility]  Brief Narrative/Hospital Course: [name] was admitted to the hospital with the working diagnosis of left ankle acute on chronic fracture.   89 y.o. female with medical history significant of multiple medical issues including history of CVA, COPD, end-stage dementia, hypertension, hypothyroidism presenting with left lower extremity wound from her memory care unit.  Limited history due to end-stage dementia.  No reported falls or trauma to the affected area.  Unclear duration.  On presentation hemodynamically stable, no leukocytosis, hemoglobin 9.7, creatinine 0.92, glucose 174.   Left tib-fib showing displaced medial malleoli are and posterior distal tibial fracture with disruption of the ankle mortise and irregular appearance of the fracture lucency with concern for osteomyelitis. Also with oblique fracture of the proximal fibular diaphysis and one half shaft width anterior displacement. Diffuse soft tissue swelling concerning for cellulitis.  Lower extremity ultrasound negative for DVT.  Per PA Sandra Kaufman, case discussed with Dr. Annamary Kaufman with podiatry. Recommending MRI of the foot and ankle to better assess. Started on broad-spectrum antibiotics in the ER.  Elevated CRP at 6 and ESR at 75, prealbumin low at 13.  Unable to obtain MRI due to presence of unknown bladder stimulator.  Discussed with podiatry they does not think that it is osteo but recommending obtaining CT scan of ankle which was ordered.  Likely an acute on chronic ankle fracture. Significant edema and bruising of left lower extremity and foot. Not a surgical candidate, podiatry is recommending cam boot and nonweightbearing on left lower extremity. Based on CT ankle results we can ask ID for their recommendations if need to treat as osteo.  She had a very shallow small ulcer without any significant sign of infection on  anterior side of ankle. 12/6 left ankle CT with acute or subacute trimalleolar fracture of the left ankle, with associated widening of the ankle mortise and lateral subluxation of the talus relative to the tibial plafond.  No definite signs of superimposed osteomyelitis.  Moderate soft tissue swelling surrounding the ankle with extension into the dorsal aspect of the foot. No evidence of focal fluid collection, foreign body or soft tissue emphysema.  12/07 plan to return to the memory unit, to continue with conservative management for pain, cam boot and NWB on LE.  Has been waiting for return to memory care and has been stable otherwise     Subjective: Patient seen and examined this morning slow to respond on baseline dementia No new complaints  Assessment and Plan: Principal Problem:   Non-healing wound of left lower extremity Active Problems:   Hypokalemia   Closed displaced fracture of medial malleolus of left tibia   Dementia (HCC)   Chronic obstructive pulmonary disease (HCC)   Hypertension   Type 2 diabetes mellitus with hyperlipidemia (HCC)   GERD (gastroesophageal reflux disease)   Non-healing wound of left lower extremity No clinical signs of osteomyelitis.  Overall afebrile no leukocytosis blood culture no growth. Continue current local wound care with Xeroform over the wound send Ace wrap of left foot and lower leg per podiatry.  On short course of antibiotics and Augmentin for superficial cellulitis.  Hypokalemia Hypomagnesemia: Resolved.  Closed displaced fracture of medial malleolus of left tibia Trimalleolar fracture of left ankle.: Seen by podiatry continue cam boot pain control WB NWB to LLE.  Follow up as outpatient with podiatry.   End-stage dementia Debility deconditioning failure to thrive Patient with multiple falls, poor prognosis,  continue supportive care.  Baseline end-stage dementia At memory care unit currently continue her memantine, sertraline and  quetiapine prn ativan  COPDl No signs of acute exacerbation, continue with bronchodilator therapy.   Hypertension Stable. At discharge will continue with metoprolol and amlodipine.    Type 2 diabetes mellitus with hyperlipidemia: Continue sliding scale insulin Januvia statin  Hypothyroid, plan to continue with levothyroxine,   GERD: Cont ppi  DVT prophylaxis: enoxaparin (LOVENOX) injection 40 mg Start: 12/02/23 2200 Code Status:   Code Status: Limited: Do not attempt resuscitation (DNR) -DNR-LIMITED -Do Not Intubate/DNI  Family Communication: plan of care discussed with patient at bedside. Patient status is: Remains hospitalized because of awaiting placement Level of care: Med-Surg   Dispo: The patient is from: Memory care unit            Anticipated disposition: Premedicating Objective: Vitals last 24 hrs: Vitals:   12/05/23 1548 12/05/23 1942 12/06/23 0202 12/06/23 0738  BP: 107/73 117/81 104/74 115/74  Pulse: 81 88 (!) 101 81  Resp:  17 16   Temp: 98.4 F (36.9 C) 98.2 F (36.8 C) 98.4 F (36.9 C) 99.1 F (37.3 C)  TempSrc:    Axillary  SpO2: 97% 99% 95% 96%  Weight:      Height:       Weight change:   Physical Examination: General exam: alert awake, slow to respond, HEENT:Oral mucosa moist, Ear/Nose WNL grossly Respiratory system: bilaterally clear BS, no use of accessory muscle Cardiovascular system: S1 & S2 +, No JVD. Gastrointestinal system: Abdomen soft,NT,ND, BS+ Nervous System:Alert, awake, moving extremities. Extremities: LE edema neg,distal peripheral pulses palpable.  Skin: No rashes,no icterus. MSK: Normal muscle bulk,tone, power  Medications reviewed:  Scheduled Meds:  amoxicillin-clavulanate  400 mg Oral Q12H   busPIRone  5 mg Oral Daily   enoxaparin (LOVENOX) injection  40 mg Subcutaneous Q24H   fluticasone  2 spray Each Nare Daily   insulin aspart  0-5 Units Subcutaneous QHS   insulin aspart  0-9 Units Subcutaneous TID WC   latanoprost   1 drop Both Eyes QHS   levothyroxine  100 mcg Oral Daily   memantine  10 mg Oral BID   pantoprazole  40 mg Oral Daily   QUEtiapine  25 mg Oral BID   sertraline  200 mg Oral Daily  Continuous Infusions:   Diet Order             Diet Carb Modified Fluid consistency: Thin; Room service appropriate? Yes  Diet effective now                  No intake or output data in the 24 hours ending 12/06/23 1416 Net IO Since Admission: 917.8 mL [12/06/23 1416]  Wt Readings from Last 3 Encounters:  12/02/23 66.7 kg  03/30/23 69 kg  01/07/23 69.9 kg     Unresulted Labs (From admission, onward)     Start     Ordered   12/09/23 0500  Creatinine, serum  (enoxaparin (LOVENOX)    CrCl >/= 30 ml/min)  Weekly,   TIMED     Comments: while on enoxaparin therapy    12/02/23 1643   12/07/23 0500  CBC  Tomorrow morning,   R       Question:  Specimen collection method  Answer:  Lab=Lab collect   12/06/23 1413          Data Reviewed: I have personally reviewed following labs and imaging studies CBC: Recent Labs  Lab 12/02/23  1118 12/02/23 1938 12/03/23 0542  WBC 9.5 7.7 6.6  HGB 9.7* 8.8* 8.2*  HCT 29.6* 26.6* 24.8*  MCV 92.2 93.0 93.6  PLT 297 231 194   Basic Metabolic Panel:  Recent Labs  Lab 12/02/23 1118 12/02/23 1938 12/03/23 0542 12/03/23 0858  NA 135  --  137  --   K 4.0  --  3.4*  --   CL 101  --  104  --   CO2 26  --  23  --   GLUCOSE 174*  --  140*  --   BUN 16  --  14  --   CREATININE 0.92 0.74 0.72  --   CALCIUM 8.4*  --  8.0*  --   MG  --   --   --  1.4*   GFR: Estimated Creatinine Clearance: 43.7 mL/min (by C-G formula based on SCr of 0.72 mg/dL). Liver Function Tests:  Recent Labs  Lab 12/03/23 0542  AST 12*  ALT 10  ALKPHOS 67  BILITOT 1.4*  PROT 6.0*  ALBUMIN 2.9*   Recent Labs  Lab 12/05/23 1145 12/05/23 1545 12/05/23 2233 12/06/23 0739 12/06/23 1136  GLUCAP 147* 127* 124* 134* 118*   No results for input(s): "CHOL", "HDL", "LDLCALC",  "TRIG", "CHOLHDL", "LDLDIRECT" in the last 72 hours. No results for input(s): "TSH", "T4TOTAL", "FREET4", "T3FREE", "THYROIDAB" in the last 72 hours. Sepsis Labs: No results for input(s): "PROCALCITON", "LATICACIDVEN" in the last 168 hours. Recent Results (from the past 240 hour(s))  Culture, blood (Routine X 2) w Reflex to ID Panel     Status: None (Preliminary result)   Collection Time: 12/02/23  7:38 PM   Specimen: BLOOD  Result Value Ref Range Status   Specimen Description BLOOD BLOOD LEFT HAND  Final   Special Requests   Final    BOTTLES DRAWN AEROBIC AND ANAEROBIC Blood Culture results may not be optimal due to an excessive volume of blood received in culture bottles   Culture   Final    NO GROWTH 4 DAYS Performed at [facility], [address] [place], [place] [address]    Report Status PENDING  Incomplete  Culture, blood (Routine X 2) w Reflex to ID Panel     Status: None (Preliminary result)   Collection Time: 12/03/23  8:58 AM   Specimen: BLOOD  Result Value Ref Range Status   Specimen Description BLOOD RAC  Final   Special Requests   Final    BOTTLES DRAWN AEROBIC AND ANAEROBIC Blood Culture adequate volume   Culture   Final    NO GROWTH 3 DAYS Performed at [facility], [address] [place], [place] [address]    Report Status PENDING  Incomplete    Antimicrobials/Microbiology: Anti-infectives (From admission, onward)    Start     Dose/Rate Route Frequency Ordered Stop   12/04/23 1400  cephALEXin (KEFLEX) capsule 500 mg  Status:  Discontinued        500 mg Oral Every 12 hours 12/04/23 1000 12/04/23 1040   12/04/23 1130  amoxicillin-clavulanate (AUGMENTIN) 400-57 MG/5ML suspension 400 mg        400 mg Oral Every 12 hours 12/04/23 1040 12/09/23 0959   12/02/23 1730  ceFEPIme (MAXIPIME) 2 g in sodium chloride 0.9 % 100 mL IVPB  Status:  Discontinued        2 g 200 mL/hr over 30 Minutes Intravenous Every 12 hours 12/02/23 1701 12/04/23 1000    12/02/23 1700  Vancomycin (VANCOCIN)  1,500 mg in sodium chloride 0.9 % 500 mL IVPB        1,500 mg 250 mL/hr over 120 Minutes Intravenous  Once 12/02/23 1648 12/02/23 2119   12/02/23 1645  metroNIDAZOLE (FLAGYL) IVPB 500 mg  Status:  Discontinued        500 mg 100 mL/hr over 60 Minutes Intravenous Every 8 hours 12/02/23 1643 12/04/23 1000   12/02/23 1630  vancomycin (VANCOREADY) IVPB 1500 mg/300 mL  Status:  Discontinued        1,500 mg 150 mL/hr over 120 Minutes Intravenous  Once 12/02/23 1529 12/02/23 1648   12/02/23 1245  cefTRIAXone (ROCEPHIN) 1 g in sodium chloride 0.9 % 100 mL IVPB  Status:  Discontinued        1 g 200 mL/hr over 30 Minutes Intravenous Every 24 hours 12/02/23 1242 12/02/23 1701         Component Value Date/Time   SDES BLOOD RAC 12/03/2023 0858   SPECREQUEST  12/03/2023 0858    BOTTLES DRAWN AEROBIC AND ANAEROBIC Blood Culture adequate volume   CULT  12/03/2023 0858    NO GROWTH 3 DAYS Performed at [facility], [address] [place], [place] [address]    REPTSTATUS PENDING 12/03/2023 2536   Radiology Studies: No results found.  LOS: 4 days  Total time spent in review of labs and imaging, patient evaluation, formulation of plan, documentation and communication with family: 25 minutes.  Lanae Boast, MD [facility]  12/06/2023, 2:16 PM

## 2023-12-06 NOTE — Progress Notes (Signed)
Pt has pulled out 2nd IV in 24 hours, pt is possibly discharging back to memory care unit today. No current IV meds ordered and vitals are stable. Per on call provider okay to leave IV out and have day time provider reassess in the morning.

## 2023-12-07 DIAGNOSIS — S81802A Unspecified open wound, left lower leg, initial encounter: Secondary | ICD-10-CM | POA: Diagnosis not present

## 2023-12-07 LAB — GLUCOSE, CAPILLARY
Glucose-Capillary: 112 mg/dL — ABNORMAL HIGH (ref 70–99)
Glucose-Capillary: 141 mg/dL — ABNORMAL HIGH (ref 70–99)
Glucose-Capillary: 111 mg/dL — ABNORMAL HIGH (ref 70–99)
Glucose-Capillary: 124 mg/dL — ABNORMAL HIGH (ref 70–99)

## 2023-12-07 LAB — CBC
HCT: 26.1 % — ABNORMAL LOW (ref 36.0–46.0)
Hemoglobin: 8.8 g/dL — ABNORMAL LOW (ref 12.0–15.0)
MCH: 31 pg (ref 26.0–34.0)
MCHC: 33.7 g/dL (ref 30.0–36.0)
MCV: 91.9 fL (ref 80.0–100.0)
Platelets: 178 10*3/uL (ref 150–400)
RBC: 2.84 MIL/uL — ABNORMAL LOW (ref 3.87–5.11)
RDW: 14.5 % (ref 11.5–15.5)
WBC: 6.3 10*3/uL (ref 4.0–10.5)
nRBC: 0 % (ref 0.0–0.2)

## 2023-12-07 LAB — CULTURE, BLOOD (ROUTINE X 2): Culture: NO GROWTH

## 2023-12-07 NOTE — Plan of Care (Signed)

## 2023-12-07 NOTE — TOC Progression Note (Signed)
Transition of Care [facility]) - Progression Note    Patient Details  Name: Sandra Kaufman MRN: [id number] Date of Birth: 1932-01-11  [facility] CM/SW Contact  Marlowe Sax, RN Phone Number: 12/07/2023, 11:18 AM  Clinical Narrative:    Spoke with the patient's son [name] provided information below Per Kenney Houseman with [facility] they can offer. Semi private $351 per day, private pay $383 per day. Requires 2 months up front - Per Revonda Standard at [facility] - $450/day private room; $418/day semi private; $389/day for 3 bed ward. Requires 1 month up front - Whitney with [employer] and [facility] to review    I explained that [facility] may not be able to manage the level of care that she is but they will assess later today  The son will let me now after he has reviewed what their plan is  Expected Discharge Plan and Services                                               Social Determinants of Health (SDOH) Interventions SDOH Screenings   Tobacco Use: Low Risk  (12/02/2023)    Readmission Risk Interventions     No data to display

## 2023-12-07 NOTE — Progress Notes (Signed)
   12/06/23 2001  Vitals  Temp 98.2 F (36.8 C)  BP 122/73  MAP (mmHg) 89  BP Location Right Arm  BP Method Automatic  Patient Position (if appropriate) Lying  Pulse Rate 89  Pulse Rate Source Monitor  Resp 17  MEWS COLOR  MEWS Score Color Green  Oxygen Therapy  SpO2 95 %  O2 Device Room Air  MEWS Score  MEWS Temp 0  MEWS Systolic 0  MEWS Pulse 0  MEWS RR 0  MEWS LOC 0  MEWS Score 0   Patient admitted via bed alert on room air.  Vital signs stable with 2 RN skin assessment completed with charge nurse Elnita Maxwell.  Fall precautions in place with bed alarm set; call bell within reach; plan of care continues.

## 2023-12-07 NOTE — TOC Progression Note (Signed)
Transition of Care [facility]) - Progression Note    Patient Details  Name: Sandra Kaufman MRN: [id number] Date of Birth: July 04, 1932  Transition of [facility]) CM/SW Contact  Chapman Fitch, RN Phone Number: 12/07/2023, 8:53 AM  Clinical Narrative:      Per Ed at [facility] he feels like that patient requires higher level of care than they can provide.  He states that he feels like it is best for the patient and family to move forward with SNF with hospice.  Reached out to facilites that have offered a bed to ensure they say that the saw the referral was for Private pay with hospice  - Per Kenney Houseman with [facility] they can offer.  Semi private $351 per day, private pay $383 per day.  Requires 2 months up front - Per Revonda Standard at [facility] - $450/day private room; $418/day semi private; $389/day for 3 bed ward.  Requires 1 month up front - Whitney with [employer] and [facility] to review      Expected Discharge Plan and Services                                               Social Determinants of Health (SDOH) Interventions SDOH Screenings   Tobacco Use: Low Risk  (12/02/2023)    Readmission Risk Interventions     No data to display

## 2023-12-07 NOTE — Progress Notes (Signed)
PROGRESS NOTE Sandra Kaufman  OZH:[id number] DOB: 06-27-1932 DOA: 12/02/2023 PCP: Almetta Lovely, [facility]  Brief Narrative/Hospital Course: Sandra Kaufman was admitted to the hospital with the working diagnosis of left ankle acute on chronic fracture.   89 y.o. female with medical history significant of multiple medical issues including history of CVA, COPD, end-stage dementia, hypertension, hypothyroidism presenting with left lower extremity wound from her memory care unit.  Limited history due to end-stage dementia.  No reported falls or trauma to the affected area.  Unclear duration.  On presentation hemodynamically stable, no leukocytosis, hemoglobin 9.7, creatinine 0.92, glucose 174.   Left tib-fib showing displaced medial malleoli are and posterior distal tibial fracture with disruption of the ankle mortise and irregular appearance of the fracture lucency with concern for osteomyelitis. Also with oblique fracture of the proximal fibular diaphysis and one half shaft width anterior displacement. Diffuse soft tissue swelling concerning for cellulitis.  Lower extremity ultrasound negative for DVT.  Per PA Poggi, case discussed with Dr. Annamary Rummage with podiatry. Recommending MRI of the foot and ankle to better assess. Started on broad-spectrum antibiotics in the ER.  Elevated CRP at 6 and ESR at 75, prealbumin low at 13.  Unable to obtain MRI due to presence of unknown bladder stimulator.  Discussed with podiatry they does not think that it is osteo but recommending obtaining CT scan of ankle which was ordered.  Likely an acute on chronic ankle fracture. Significant edema and bruising of left lower extremity and foot. Not a surgical candidate, podiatry is recommending cam boot and nonweightbearing on left lower extremity. Based on CT ankle results we can ask ID for their recommendations if need to treat as osteo.  She had a very shallow small ulcer without any significant sign of infection on  anterior side of ankle. 12/6 left ankle CT with acute or subacute trimalleolar fracture of the left ankle, with associated widening of the ankle mortise and lateral subluxation of the talus relative to the tibial plafond.  No definite signs of superimposed osteomyelitis.  Moderate soft tissue swelling surrounding the ankle with extension into the dorsal aspect of the foot. No evidence of focal fluid collection, foreign body or soft tissue emphysema.  12/07 plan to return to the memory unit, to continue with conservative management for pain, cam boot and NWB on LE.  Has been waiting for return to memory care and has been stable otherwise     Subjective: Patient seen and examined.   She is alert, awake oriented to self her date of birth, thinks weeks is the president, Nursing reports she amy be pocketing medication No other issues.  Assessment and Plan: Principal Problem:   Non-healing wound of left lower extremity Active Problems:   Hypokalemia   Closed displaced fracture of medial malleolus of left tibia   Dementia (HCC)   Chronic obstructive pulmonary disease (HCC)   Hypertension   Type 2 diabetes mellitus with hyperlipidemia (HCC)   GERD (gastroesophageal reflux disease)   Non-healing wound of left lower extremity No clinical signs of osteomyelitis.  Overall afebrile no leukocytosis blood culture no growth. Continue current local wound care with Xeroform over the wound send Ace wrap of left foot and lower leg per podiatry.  On short course of antibiotics and complete abx for superficial cellulitis.  Hypokalemia Hypomagnesemia: Resolved.  Closed displaced fracture of medial malleolus of left tibia Trimalleolar fracture of left ankle.: Seen by podiatry continue cam boot pain control WB NWB to LLE.  Follow up as  outpatient with podiatry.  Wrist x-ray no acute fracture.  End-stage dementia Debility deconditioning failure to thrive Patient with multiple falls, poor prognosis,  continue supportive care.  Baseline end-stage dementia At memory care unit currently continue her memantine, sertraline and quetiapine prn ativan. Awaiting on placement  COPD No signs of acute exacerbation, continue with bronchodilator therapy.   Hypertension Stable. At discharge will continue with metoprolol and amlodipine.    Type 2 diabetes mellitus with hyperlipidemia: Continue sliding scale insulin Januvia statin  Hypothyroid, plan to continue with levothyroxine,   GERD: Cont ppi  DVT prophylaxis: enoxaparin (LOVENOX) injection 40 mg Start: 12/02/23 2200 Code Status:   Code Status: Limited: Do not attempt resuscitation (DNR) -DNR-LIMITED -Do Not Intubate/DNI  Family Communication: plan of care discussed with patient at bedside. Patient status is: Remains hospitalized because of awaiting placement Level of care: Med-Surg   Dispo: The patient is from: Memory care unit            Anticipated disposition: Awaiting on placement. Objective: Vitals last 24 hrs: Vitals:   12/06/23 1630 12/06/23 2001 12/06/23 2309 12/07/23 0820  BP: 130/72 122/73  98/64  Pulse: 93 89 93 75  Resp: 16 17 17 16   Temp: 98.7 F (37.1 C) 98.2 F (36.8 C) 99.2 F (37.3 C) 98 F (36.7 C)  TempSrc: Oral     SpO2: 96% 95% 93% 96%  Weight:      Height:       Weight change:   Physical Examination: General exam: alert awake, oriented x 1-2 HEENT:Oral mucosa moist, Ear/Nose WNL grossly Respiratory system: Bilaterally clear BS,no use of accessory muscle Cardiovascular system: S1 & S2 +, No JVD. Gastrointestinal system: Abdomen soft,NT,ND, BS+ Nervous System: Alert, awake, moving all extremities,and following commands. Extremities: LE edema neg,distal peripheral pulses palpable and warm.  Skin: No rashes,no icterus. MSK: Normal muscle bulk,tone, power   Medications reviewed:  Scheduled Meds:  amoxicillin-clavulanate  400 mg Oral Q12H   busPIRone  5 mg Oral Daily   enoxaparin (LOVENOX)  injection  40 mg Subcutaneous Q24H   fluticasone  2 spray Each Nare Daily   insulin aspart  0-5 Units Subcutaneous QHS   insulin aspart  0-9 Units Subcutaneous TID WC   latanoprost  1 drop Both Eyes QHS   levothyroxine  100 mcg Oral Daily   memantine  10 mg Oral BID   pantoprazole  40 mg Oral Daily   QUEtiapine  25 mg Oral BID   sertraline  200 mg Oral Daily  Continuous Infusions:   Diet Order             Diet Carb Modified Fluid consistency: Thin; Room service appropriate? Yes  Diet effective now                  No intake or output data in the 24 hours ending 12/07/23 1224 Net IO Since Admission: 917.8 mL [12/07/23 1224]  Wt Readings from Last 3 Encounters:  12/02/23 66.7 kg  03/30/23 69 kg  01/07/23 69.9 kg     Unresulted Labs (From admission, onward)     Start     Ordered   12/09/23 0500  Creatinine, serum  (enoxaparin (LOVENOX)    CrCl >/= 30 ml/min)  Weekly,   TIMED     Comments: while on enoxaparin therapy    12/02/23 1643          Data Reviewed: I have personally reviewed following labs and imaging studies CBC: Recent Labs  Lab  12/02/23 1118 12/02/23 1938 12/03/23 0542 12/07/23 0543  WBC 9.5 7.7 6.6 6.3  HGB 9.7* 8.8* 8.2* 8.8*  HCT 29.6* 26.6* 24.8* 26.1*  MCV 92.2 93.0 93.6 91.9  PLT 297 231 194 178   Basic Metabolic Panel:  Recent Labs  Lab 12/02/23 1118 12/02/23 1938 12/03/23 0542 12/03/23 0858  NA 135  --  137  --   K 4.0  --  3.4*  --   CL 101  --  104  --   CO2 26  --  23  --   GLUCOSE 174*  --  140*  --   BUN 16  --  14  --   CREATININE 0.92 0.74 0.72  --   CALCIUM 8.4*  --  8.0*  --   MG  --   --   --  1.4*   GFR: Estimated Creatinine Clearance: 43.7 mL/min (by C-G formula based on SCr of 0.72 mg/dL). Liver Function Tests:  Recent Labs  Lab 12/03/23 0542  AST 12*  ALT 10  ALKPHOS 67  BILITOT 1.4*  PROT 6.0*  ALBUMIN 2.9*   Recent Labs  Lab 12/06/23 1136 12/06/23 1626 12/06/23 2106 12/07/23 0822 12/07/23 1117   GLUCAP 118* 139* 132* 112* 141*   No results for input(s): "CHOL", "HDL", "LDLCALC", "TRIG", "CHOLHDL", "LDLDIRECT" in the last 72 hours. No results for input(s): "TSH", "T4TOTAL", "FREET4", "T3FREE", "THYROIDAB" in the last 72 hours. Sepsis Labs: No results for input(s): "PROCALCITON", "LATICACIDVEN" in the last 168 hours. Recent Results (from the past 240 hour(s))  Culture, blood (Routine X 2) w Reflex to ID Panel     Status: None   Collection Time: 12/02/23  7:38 PM   Specimen: BLOOD  Result Value Ref Range Status   Specimen Description BLOOD BLOOD LEFT HAND  Final   Special Requests   Final    BOTTLES DRAWN AEROBIC AND ANAEROBIC Blood Culture results may not be optimal due to an excessive volume of blood received in culture bottles   Culture   Final    NO GROWTH 5 DAYS Performed at [facility], [address] [place], [place] [address]    Report Status 12/07/2023 FINAL  Final  Culture, blood (Routine X 2) w Reflex to ID Panel     Status: None (Preliminary result)   Collection Time: 12/03/23  8:58 AM   Specimen: BLOOD  Result Value Ref Range Status   Specimen Description BLOOD RAC  Final   Special Requests   Final    BOTTLES DRAWN AEROBIC AND ANAEROBIC Blood Culture adequate volume   Culture   Final    NO GROWTH 4 DAYS Performed at [facility], [address] [place], [place] [address]    Report Status PENDING  Incomplete    Antimicrobials/Microbiology: Anti-infectives (From admission, onward)    Start     Dose/Rate Route Frequency Ordered Stop   12/04/23 1400  cephALEXin (KEFLEX) capsule 500 mg  Status:  Discontinued        500 mg Oral Every 12 hours 12/04/23 1000 12/04/23 1040   12/04/23 1130  amoxicillin-clavulanate (AUGMENTIN) 400-57 MG/5ML suspension 400 mg        400 mg Oral Every 12 hours 12/04/23 1040 12/09/23 0959   12/02/23 1730  ceFEPIme (MAXIPIME) 2 g in sodium chloride 0.9 % 100 mL IVPB  Status:  Discontinued        2 g 200 mL/hr  over 30 Minutes Intravenous Every 12 hours 12/02/23 1701 12/04/23 1000  12/02/23 1700  Vancomycin (VANCOCIN) 1,500 mg in sodium chloride 0.9 % 500 mL IVPB        1,500 mg 250 mL/hr over 120 Minutes Intravenous  Once 12/02/23 1648 12/02/23 2119   12/02/23 1645  metroNIDAZOLE (FLAGYL) IVPB 500 mg  Status:  Discontinued        500 mg 100 mL/hr over 60 Minutes Intravenous Every 8 hours 12/02/23 1643 12/04/23 1000   12/02/23 1630  vancomycin (VANCOREADY) IVPB 1500 mg/300 mL  Status:  Discontinued        1,500 mg 150 mL/hr over 120 Minutes Intravenous  Once 12/02/23 1529 12/02/23 1648   12/02/23 1245  cefTRIAXone (ROCEPHIN) 1 g in sodium chloride 0.9 % 100 mL IVPB  Status:  Discontinued        1 g 200 mL/hr over 30 Minutes Intravenous Every 24 hours 12/02/23 1242 12/02/23 1701         Component Value Date/Time   SDES BLOOD RAC 12/03/2023 0858   SPECREQUEST  12/03/2023 0858    BOTTLES DRAWN AEROBIC AND ANAEROBIC Blood Culture adequate volume   CULT  12/03/2023 0858    NO GROWTH 4 DAYS Performed at [facility], [address] [place], [place] [address]    REPTSTATUS PENDING 12/03/2023 6160   Radiology Studies: DG Wrist 2 Views Right  Result Date: 12/07/2023 CLINICAL DATA:  [id number], [id number], with right wrist sprain. EXAM: RIGHT WRIST - 2 VIEW COMPARISON:  Right forearm series 07/05/2015. FINDINGS: There is progressive bone demineralization. No displaced fracture is evident. There is chondrocalcinosis. There is mild swelling over the dorsal and ulnar aspect of the wrist. There is mild narrowing of the radiocarpal joint. There is progressive bone-on-bone joint space loss and osteophytosis of the triscaphe and first CMC joints. There is no erosive arthropathy. Other joint spaces are maintained. No foreign body is seen. IMPRESSION: 1. No displaced fracture is seen. 2. Progressive bone demineralization. 3. Mild swelling over the dorsal and ulnar aspect of the wrist. 4. Progressive  degenerative changes of the triscaphe and first CMC joints. 5. Chondrocalcinosis. Electronically Signed   By: Almira Bar M.D.   On: 12/07/2023 00:15    LOS: 5 days  Total time spent in review of labs and imaging, patient evaluation, formulation of plan, documentation and communication with family: 25 minutes.  Lanae Boast, MD [facility]  12/07/2023, 12:24 PM

## 2023-12-08 DIAGNOSIS — E785 Hyperlipidemia, unspecified: Secondary | ICD-10-CM

## 2023-12-08 DIAGNOSIS — E039 Hypothyroidism, unspecified: Secondary | ICD-10-CM

## 2023-12-08 DIAGNOSIS — E1169 Type 2 diabetes mellitus with other specified complication: Secondary | ICD-10-CM

## 2023-12-08 DIAGNOSIS — K219 Gastro-esophageal reflux disease without esophagitis: Secondary | ICD-10-CM

## 2023-12-08 DIAGNOSIS — F02C0 Dementia in other diseases classified elsewhere, severe, without behavioral disturbance, psychotic disturbance, mood disturbance, and anxiety: Secondary | ICD-10-CM

## 2023-12-08 DIAGNOSIS — E876 Hypokalemia: Secondary | ICD-10-CM | POA: Diagnosis not present

## 2023-12-08 DIAGNOSIS — I10 Essential (primary) hypertension: Secondary | ICD-10-CM

## 2023-12-08 DIAGNOSIS — J449 Chronic obstructive pulmonary disease, unspecified: Secondary | ICD-10-CM

## 2023-12-08 DIAGNOSIS — L03116 Cellulitis of left lower limb: Secondary | ICD-10-CM | POA: Diagnosis not present

## 2023-12-08 DIAGNOSIS — S81802A Unspecified open wound, left lower leg, initial encounter: Secondary | ICD-10-CM | POA: Diagnosis not present

## 2023-12-08 DIAGNOSIS — S8252XA Displaced fracture of medial malleolus of left tibia, initial encounter for closed fracture: Secondary | ICD-10-CM | POA: Diagnosis not present

## 2023-12-08 DIAGNOSIS — G309 Alzheimer's disease, unspecified: Secondary | ICD-10-CM

## 2023-12-08 LAB — GLUCOSE, CAPILLARY
Glucose-Capillary: 109 mg/dL — ABNORMAL HIGH (ref 70–99)
Glucose-Capillary: 168 mg/dL — ABNORMAL HIGH (ref 70–99)
Glucose-Capillary: 171 mg/dL — ABNORMAL HIGH (ref 70–99)
Glucose-Capillary: 159 mg/dL — ABNORMAL HIGH (ref 70–99)
Glucose-Capillary: 106 mg/dL — ABNORMAL HIGH (ref 70–99)

## 2023-12-08 LAB — CULTURE, BLOOD (ROUTINE X 2)
Culture: NO GROWTH
Special Requests: ADEQUATE

## 2023-12-08 NOTE — Assessment & Plan Note (Signed)
See assessment and plan above

## 2023-12-08 NOTE — Progress Notes (Signed)
PROGRESS NOTE   Sandra Kaufman  WNU:[id number] DOB: 1932-08-04 DOA: 12/02/2023 PCP: Almetta Lovely, [facility]   Date of Service: the patient was seen and examined on 12/08/2023  Brief Narrative:  89 y.o. female with medical history significant of multiple medical issues including advanced dementia, history of CVA, COPD, hypertension, hypothyroidism presenting with left lower extremity wound from her memory care unit.   Upon evaluation in the emergency department, Left tib-fib showing displaced medial malleoli are and posterior distal tibial fracture with disruption of the ankle mortise and irregular appearance of the fracture lucency with initial concern for osteomyelitis.  Per discussions with Dr. Annamary Rummage with podiatry MRI was recommended initially.  Patient was placed on empiric intravenous antibiotics for possible osteomyelitis and the hospitalist group was then called to assess the patient for admission the hospital.   After hospitalization, patient was unable to proceed with MRI due to presence of unknown bladder stimulator.  Dr. Annamary Rummage was formally assaulted who did not feel that patient's presentation was consistent with osteo.  CT imaging of the ankle was recommended revealing likely acute on chronic ankle fracture with no evidence of superimposed osteomyelitis.  Clinically, patient exhibited significant edema and bruising of left lower extremity and foot.  Due to concerns for possible superimposed cellulitis patient was treated with a course of intravenous antibiotics followed by oral Augmentin.    Per podiatry patient is not a surgical candidate.  Podiatry is recommending cam boot and nonweightbearing on left lower extremity. Current plan is for facility placement with continued conservative management for pain, cam boot and NWB on LE.      Assessment & Plan Non-healing wound of left lower extremity No clinical signs of osteomyelitis. CT imaging revealed no evidence of  superimposed osteomyelitis Podiatry recommending continuing local wound care with: Xerform over wounds and ace wrap over the left foot and lower leg per podiatry recommendations.  Completing course of antibiotic therapy, currently on oral Augmentin for suspected superficial cellulitis.  Cellulitis of left lower extremity See assessment and plan above Hypokalemia Replaced Patient received Kcl and Mag sulfate for electrolyte correction. Closed displaced fracture of medial malleolus of left tibia Trimalleolar fracture of left ankle.  Plan to continue pain control, CAM boot to LEE per podiatry recommendations WB NWB to LLE per podiatry recommendations Follow up as outpatient with podiatry.  Hypomagnesemia Replaced Dementia (HCC) Baseline advanced dementia Currently seeking placement Continue memantine, sertraline and quetiapine.  As needed lorazepam.   Chronic obstructive pulmonary disease (HCC) No signs of acute exacerbation, continue with bronchodilator therapy.  Hypertension Blood pressure has been stable, at the time of discharge will continue with metoprolol and amlodipine.   Type 2 diabetes mellitus with hyperlipidemia (HCC) Hemoglobin A1c 6.7% on 12/5 Accu-Cheks before every meal and nightly with sign scale insulin  GERD (gastroesophageal reflux disease) Continue with pantoprazole.  Hypothyroidism Resume home regimen of Synthroid      Subjective:  Patient unable to answer questions appropriately due to advanced dementia.  Patient denies pain.  Physical Exam:  Vitals:   12/07/23 1540 12/07/23 2156 12/08/23 0826 12/08/23 1541  BP: 135/73 112/68 137/86 102/62  Pulse: 84 83 83 87  Resp: 16 20 16 17   Temp:  98.7 F (37.1 C) 98.2 F (36.8 C) 98.9 F (37.2 C)  TempSrc:      SpO2: 99% 98% 97% 96%  Weight:      Height:         Constitutional: Awake and alert, disoriented, patient is currently not in  any acute distress. Skin: Dressing of the left lower extremity  is clean dry and intact.  ENMT: Moist mucous membranes noted.  Posterior pharynx clear of any exudate or lesions.   Respiratory: clear to auscultation bilaterally, no wheezing, no crackles. Normal respiratory effort. No accessory muscle use.  Cardiovascular: Edema of the left lower extremity is improving.  Regular rate and rhythm, no murmurs / rubs / gallops. 2+ pedal pulses. No carotid bruits.  Abdomen: Abdomen is soft and nontender.  No evidence of intra-abdominal masses.  Positive bowel sounds noted in all quadrants.   Musculoskeletal: Pain with both passive and active range of motion of the right wrist.  Pain with both passive and active range of motion of the left foot.      Data Reviewed:  I have personally reviewed and interpreted labs, imaging.  Significant findings are   CBC: Recent Labs  Lab 12/02/23 1118 12/02/23 1938 12/03/23 0542 12/07/23 0543  WBC 9.5 7.7 6.6 6.3  HGB 9.7* 8.8* 8.2* 8.8*  HCT 29.6* 26.6* 24.8* 26.1*  MCV 92.2 93.0 93.6 91.9  PLT 297 231 194 178   Basic Metabolic Panel: Recent Labs  Lab 12/02/23 1118 12/02/23 1938 12/03/23 0542 12/03/23 0858  NA 135  --  137  --   K 4.0  --  3.4*  --   CL 101  --  104  --   CO2 26  --  23  --   GLUCOSE 174*  --  140*  --   BUN 16  --  14  --   CREATININE 0.92 0.74 0.72  --   CALCIUM 8.4*  --  8.0*  --   MG  --   --   --  1.4*   GFR: Estimated Creatinine Clearance: 43.7 mL/min (by C-G formula based on SCr of 0.72 mg/dL). Liver Function Tests: Recent Labs  Lab 12/03/23 0542  AST 12*  ALT 10  ALKPHOS 67  BILITOT 1.4*  PROT 6.0*  ALBUMIN 2.9*    Code Status:  DNR.    Severity of Illness:  The appropriate patient status for this patient is INPATIENT. Inpatient status is judged to be reasonable and necessary in order to provide the required intensity of service to ensure the patient's safety. The patient's presenting symptoms, physical exam findings, and initial radiographic and laboratory data in  the context of their chronic comorbidities is felt to place them at high risk for further clinical deterioration. Furthermore, it is not anticipated that the patient will be medically stable for discharge from the hospital within 2 midnights of admission.   * I certify that at the point of admission it is my clinical judgment that the patient will require inpatient hospital care spanning beyond 2 midnights from the point of admission due to high intensity of service, high risk for further deterioration and high frequency of surveillance required.*  Time spent:  38 minutes  Author:  Marinda Elk MD  12/08/2023 9:13 PM

## 2023-12-08 NOTE — Plan of Care (Signed)

## 2023-12-08 NOTE — TOC Progression Note (Signed)
Transition of Care [facility]) - Progression Note    Patient Details  Name: Sandra Kaufman MRN: [id number] Date of Birth: 23-Mar-1932  [facility] CM/SW Contact  Marlowe Sax, RN Phone Number: 12/08/2023, 11:56 AM  Clinical Narrative:    Spoke with the Philip Aspen, I asked him if he has reviewed the bed offers so that we discussed yesterday. He stated that he is going to go to [facility] and check them out, I provided him with my contact Information and he will call me once he has been able to review the facility        Expected Discharge Plan and Services                                               Social Determinants of Health (SDOH) Interventions SDOH Screenings   Tobacco Use: Low Risk  (12/02/2023)    Readmission Risk Interventions     No data to display

## 2023-12-08 NOTE — Assessment & Plan Note (Signed)
.   Resume home regimen of Synthroid 

## 2023-12-08 NOTE — Assessment & Plan Note (Signed)
Replaced. °

## 2023-12-09 DIAGNOSIS — L03116 Cellulitis of left lower limb: Secondary | ICD-10-CM | POA: Diagnosis not present

## 2023-12-09 DIAGNOSIS — S81802A Unspecified open wound, left lower leg, initial encounter: Secondary | ICD-10-CM | POA: Diagnosis not present

## 2023-12-09 DIAGNOSIS — E876 Hypokalemia: Secondary | ICD-10-CM | POA: Diagnosis not present

## 2023-12-09 DIAGNOSIS — S8252XA Displaced fracture of medial malleolus of left tibia, initial encounter for closed fracture: Secondary | ICD-10-CM | POA: Diagnosis not present

## 2023-12-09 LAB — COMPREHENSIVE METABOLIC PANEL
ALT: 9 U/L (ref 0–44)
AST: 11 U/L — ABNORMAL LOW (ref 15–41)
Albumin: 2.9 g/dL — ABNORMAL LOW (ref 3.5–5.0)
Alkaline Phosphatase: 72 U/L (ref 38–126)
Anion gap: 9 (ref 5–15)
BUN: 7 mg/dL — ABNORMAL LOW (ref 8–23)
CO2: 24 mmol/L (ref 22–32)
Calcium: 8.1 mg/dL — ABNORMAL LOW (ref 8.9–10.3)
Chloride: 100 mmol/L (ref 98–111)
Creatinine, Ser: 0.67 mg/dL (ref 0.44–1.00)
GFR, Estimated: >60 mL/min (ref >60)
Glucose, Bld: 115 mg/dL — ABNORMAL HIGH (ref 70–99)
Potassium: 3.3 mmol/L — ABNORMAL LOW (ref 3.5–5.1)
Sodium: 133 mmol/L — ABNORMAL LOW (ref 135–145)
Total Bilirubin: 0.9 mg/dL (ref <1.2)
Total Protein: 6.3 g/dL — ABNORMAL LOW (ref 6.5–8.1)

## 2023-12-09 LAB — GLUCOSE, CAPILLARY
Glucose-Capillary: 117 mg/dL — ABNORMAL HIGH (ref 70–99)
Glucose-Capillary: 149 mg/dL — ABNORMAL HIGH (ref 70–99)
Glucose-Capillary: 161 mg/dL — ABNORMAL HIGH (ref 70–99)

## 2023-12-09 LAB — CBC WITH DIFFERENTIAL/PLATELET
Abs Immature Granulocytes: 0.03 10*3/uL (ref 0.00–0.07)
Basophils Absolute: 0 10*3/uL (ref 0.0–0.1)
Basophils Relative: 0 %
Eosinophils Absolute: 0 10*3/uL (ref 0.0–0.5)
Eosinophils Relative: 0 %
HCT: 29.1 % — ABNORMAL LOW (ref 36.0–46.0)
Hemoglobin: 9.7 g/dL — ABNORMAL LOW (ref 12.0–15.0)
Immature Granulocytes: 0 %
Lymphocytes Relative: 13 %
Lymphs Abs: 0.9 10*3/uL (ref 0.7–4.0)
MCH: 31 pg (ref 26.0–34.0)
MCHC: 33.3 g/dL (ref 30.0–36.0)
MCV: 93 fL (ref 80.0–100.0)
Monocytes Absolute: 0.5 10*3/uL (ref 0.1–1.0)
Monocytes Relative: 7 %
Neutro Abs: 5.4 10*3/uL (ref 1.7–7.7)
Neutrophils Relative %: 80 %
Platelets: 182 10*3/uL (ref 150–400)
RBC: 3.13 MIL/uL — ABNORMAL LOW (ref 3.87–5.11)
RDW: 14.5 % (ref 11.5–15.5)
WBC: 6.8 10*3/uL (ref 4.0–10.5)
nRBC: 0 % (ref 0.0–0.2)

## 2023-12-09 LAB — MAGNESIUM: Magnesium: 1.6 mg/dL — ABNORMAL LOW (ref 1.7–2.4)

## 2023-12-09 LAB — URIC ACID: Uric Acid, Serum: 3 mg/dL (ref 2.5–7.1)

## 2023-12-09 MED ORDER — OXYCODONE HCL 5 MG PO TABS: 5.0000 mg | ORAL_TABLET | Freq: Four times a day (QID) | ORAL | 0 refills | Status: DC | PRN

## 2023-12-09 MED ORDER — MAGNESIUM OXIDE -MG SUPPLEMENT 400 (240 MG) MG PO TABS
800.0000 mg | ORAL_TABLET | Freq: Once | ORAL | Status: DC
  Filled 2023-12-09: qty 2

## 2023-12-09 MED ORDER — MAGNESIUM SULFATE 2 GM/50ML IV SOLN: 2.0000 g | Freq: Once | INTRAVENOUS | Status: DC

## 2023-12-09 MED ORDER — POTASSIUM CHLORIDE CRYS ER 20 MEQ PO TBCR
40.0000 meq | EXTENDED_RELEASE_TABLET | Freq: Once | ORAL | Status: DC
Start: 2023-12-09 — End: 2023-12-09
  Filled 2023-12-09: qty 2

## 2023-12-09 MED ORDER — INSULIN ASPART 100 UNIT/ML IJ SOLN: 0.0000 [IU] | Freq: Three times a day (TID) | INTRAMUSCULAR | Status: DC

## 2023-12-09 NOTE — Assessment & Plan Note (Deleted)
.   Resume home regimen of Synthroid 

## 2023-12-09 NOTE — Discharge Instructions (Addendum)
Patient to receive assistance with all meals Accuchecks QAC and at bedtime using sliding scale insulin in the med rec Patient WBAT of the LLE, patient may get out of bed with assistance with a CAM boot Please ensure patient follows up with Podiatry in 2 weeks with Dr. Annamary Rummage and primary facility provider per protocol.  Patient is DNR Wound Care: Xeroform to left anterior ankle wound, wrap foot and ankle with ace wrap for compression/edema control.  Change daily.

## 2023-12-09 NOTE — Care Management Important Message (Signed)
Important Message  Patient Details  Name: Sandra Kaufman MRN: [id number] Date of Birth: [birth year]   Important Message Given:  Yes - Medicare IM  HCPOA, Chuck Kron returned my call and is in agreement with the discharge.  I thanked him for returning my call and wished him a good afternoon.    Olegario Messier A Arshiya Jakes 12/09/2023, 11:43 AM

## 2023-12-09 NOTE — Assessment & Plan Note (Deleted)
Continue with pantoprazole/.  

## 2023-12-09 NOTE — Assessment & Plan Note (Deleted)
Hemoglobin A1c 6.7% on 12/5 Accu-Cheks before every meal and nightly with sign scale insulin

## 2023-12-09 NOTE — Assessment & Plan Note (Deleted)
Baseline advanced dementia Currently seeking placement Continue memantine, sertraline and quetiapine.  As needed lorazepam.

## 2023-12-09 NOTE — Assessment & Plan Note (Deleted)
Trimalleolar fracture of left ankle.  Plan to continue pain control, CAM boot to LEE per podiatry recommendations WB NWB to LLE per podiatry recommendations Follow up as outpatient with podiatry.

## 2023-12-09 NOTE — Plan of Care (Signed)

## 2023-12-09 NOTE — Care Management Important Message (Signed)
Important Message  Patient Details  Name: Sandra Kaufman MRN: [id number] Date of Birth: 1932-03-09   Important Message Given:  Other (see comment)  Left a message for the patient's [name], [name], son [phone] to review this form. Will await a return call.    Olegario Messier A Shar Paez 12/09/2023, 10:07 AM

## 2023-12-09 NOTE — Discharge Summary (Addendum)
Physician Discharge Summary   Patient: Sandra Kaufman MRN: [id number] DOB: [birth year]  Admit date:     12/02/2023  Discharge date: 12/09/23  Discharge Physician: Marinda Elk   PCP: [facility]   Recommendations at discharge:   Patient to receive assistance with all meals Accuchecks QAC and at bedtime using sliding scale insulin in the med rec Patient WBAT of the LLE, patient may get out of bed with assistance with a CAM boot Please ensure patient follows up with Podiatry in 2 weeks with Dr. Annamary Rummage and primary facility provider per protocol.  Patient is DNR Wound Care: Xeroform to left anterior ankle wound, wrap foot and ankle with ace wrap for compression/edema control.  Change daily.  Discharge Diagnoses: Principal Problem:   Non-healing wound of left lower extremity Active Problems:   Hypokalemia   Cellulitis of left lower extremity   Closed displaced fracture of medial malleolus of left tibia   Dementia (HCC)   Hypomagnesemia   Chronic obstructive pulmonary disease (HCC)   Hypertension   Type 2 diabetes mellitus with hyperlipidemia (HCC)   GERD (gastroesophageal reflux disease)   Hypothyroidism  Resolved Problems:   * No resolved hospital problems. *   Hospital Course: 89 y.o. female with medical history significant of multiple medical issues including advanced dementia, history of CVA, COPD, hypertension, hypothyroidism presenting with left lower extremity wound from her memory care unit.   Upon evaluation in the emergency department, Left tib-fib showing displaced medial malleoli are and posterior distal tibial fracture with disruption of the ankle mortise and irregular appearance of the fracture lucency with initial concern for osteomyelitis.  Per discussions with Dr. Annamary Rummage with podiatry MRI was recommended initially.  Patient was placed on empiric intravenous antibiotics for possible osteomyelitis and the hospitalist group was then  called to assess the patient for admission the hospital.   After hospitalization, patient was unable to proceed with MRI due to presence of unknown bladder stimulator.  Dr. Annamary Rummage was formally consulted who did not feel that patient's presentation was consistent with osteo.  CT imaging of the ankle was recommended revealing likely acute on chronic ankle fractures with no evidence of superimposed osteomyelitis.  Clinically, patient exhibited significant edema and bruising of left lower extremity and foot.  Due to concerns for possible superimposed cellulitis patient was treated with a course of intravenous antibiotics followed by oral Augmentin.    Per Dr. Annamary Rummage with podiatry patient is not a surgical candidate.  Podiatry is recommending cam boot and nonweightbearing on left lower extremity with conserative management and oupatient follow up.   Arrangements have been made for the patient to be discharged to [facility] for skilled PT and 24 hour supervision.  Patient was discharged in stable condition on 12/09/2023.     Pain control - [employer] Controlled Substance Reporting System database was reviewed. and patient was instructed, not to drive, operate heavy machinery, perform activities at heights, swimming or participation in water activities or provide baby-sitting services while on Pain, Sleep and Anxiety Medications; until their outpatient Physician has advised to do so again. Also recommended to not to take more than prescribed Pain, Sleep and Anxiety Medications.   Consultants: Dr. Annamary Rummage with Podiatry Procedures performed: None  Disposition: Skilled nursing facility Diet recommendation:  Cardiac and Carb modified diet  DISCHARGE MEDICATION: Allergies as of 12/09/2023       Reactions   Tramadol Nausea And Vomiting        Medication List  STOP taking these medications    amLODipine 5 MG tablet Commonly known as: NORVASC   diclofenac Sodium 1 %  Gel Commonly known as: VOLTAREN   metoprolol tartrate 25 MG tablet Commonly known as: LOPRESSOR       TAKE these medications    acetaminophen 325 MG tablet Commonly known as: TYLENOL Take 650 mg by mouth 3 (three) times daily as needed for headache (neck pain).   albuterol 108 (90 Base) MCG/ACT inhaler Commonly known as: VENTOLIN HFA Inhale 2 puffs into the lungs every 6 (six) hours as needed for wheezing or shortness of breath.   Aspirin Low Dose 81 MG chewable tablet Generic drug: aspirin Chew 81 mg by mouth daily.   atorvastatin 40 MG tablet Commonly known as: LIPITOR Take 40 mg by mouth at bedtime.   bimatoprost 0.01 % Soln Commonly known as: LUMIGAN Place 1 drop into both eyes at bedtime.   busPIRone 5 MG tablet Commonly known as: BUSPAR Take 5 mg by mouth daily.   fluticasone 50 MCG/ACT nasal spray Commonly known as: FLONASE Place 2 sprays into both nostrils daily.   insulin aspart 100 UNIT/ML injection Commonly known as: novoLOG Inject 0-9 Units into the skin 4 (four) times daily -  before meals and at bedtime.   Januvia 50 MG tablet Generic drug: sitaGLIPtin Take 50 mg by mouth daily.   levothyroxine 100 MCG tablet Commonly known as: SYNTHROID Take 100 mcg by mouth daily.   LORazepam 0.5 MG tablet Commonly known as: ATIVAN Take 0.5 mg by mouth 3 (three) times daily as needed.   memantine 10 MG tablet Commonly known as: NAMENDA Take 10 mg by mouth 2 (two) times daily.   oxyCODONE 5 MG immediate release tablet Commonly known as: Oxy IR/ROXICODONE Take 1 tablet (5 mg total) by mouth 4 (four) times daily as needed for up to 30 doses.   pantoprazole 40 MG tablet Commonly known as: PROTONIX Take 1 tablet (40 mg total) by mouth daily.   QUEtiapine 25 MG tablet Commonly known as: SEROQUEL Take 25 mg by mouth 2 (two) times daily.   sertraline 100 MG tablet Commonly known as: ZOLOFT Take 200 mg by mouth daily.               Discharge  Care Instructions  (From admission, onward)           Start     Ordered   12/09/23 0000  Discharge wound care:       Comments: Xeroform to anterior ankle wound, wrap foot and ankle with ace wrap for compression/edema control.  Change daily.   12/09/23 1209            Follow-up Information     Standiford, Jenelle Mages, DPM Follow up in 2 week(s).   Specialty: Actuary information: [address] [place] [address] [phone]         Care, [facility]. Go to.   Specialty: Skilled Nursing Facility Contact information: [address] [place] [address] [phone]                 Discharge Exam: Ceasar Mons Weights   12/02/23 1527  Weight: 66.7 kg   Constitutional: Awake alert with no associated distress.   Respiratory: clear to auscultation bilaterally, no wheezing, no crackles. Normal respiratory effort. No accessory muscle use.  Cardiovascular: Regular rate and rhythm, no murmurs / rubs / gallops. No extremity edema. 2+ pedal pulses. No carotid bruits.  Abdomen: Abdomen  is soft and nontender.  No evidence of intra-abdominal masses.  Positive bowel sounds noted in all quadrants.   Musculoskeletal: ACE wrap of LLE in place with notable tenderness. Improved to prior exams.    Condition at discharge: fair  The results of significant diagnostics from this hospitalization (including imaging, microbiology, ancillary and laboratory) are listed below for reference.   Imaging Studies: DG Wrist 2 Views Right Result Date: 12/07/2023 CLINICAL DATA:  [id number], [phone], with right wrist sprain. EXAM: RIGHT WRIST - 2 VIEW COMPARISON:  Right forearm series 07/05/2015. FINDINGS: There is progressive bone demineralization. No displaced fracture is evident. There is chondrocalcinosis. There is mild swelling over the dorsal and ulnar aspect of the wrist. There is mild narrowing of the radiocarpal joint. There is progressive bone-on-bone joint  space loss and osteophytosis of the triscaphe and first CMC joints. There is no erosive arthropathy. Other joint spaces are maintained. No foreign body is seen. IMPRESSION: 1. No displaced fracture is seen. 2. Progressive bone demineralization. 3. Mild swelling over the dorsal and ulnar aspect of the wrist. 4. Progressive degenerative changes of the triscaphe and first CMC joints. 5. Chondrocalcinosis. Electronically Signed   By: Almira Bar M.D.   On: 12/07/2023 00:15   CT ANKLE LEFT WO CONTRAST Result Date: 12/04/2023 CLINICAL DATA:  Ankle fracture. Evaluate for acuity and superimposed osteomyelitis. EXAM: CT OF THE LEFT ANKLE WITHOUT CONTRAST TECHNIQUE: Multidetector CT imaging of the left ankle was performed according to the standard protocol. Multiplanar CT image reconstructions were also generated. RADIATION DOSE REDUCTION: This exam was performed according to the departmental dose-optimization program which includes automated exposure control, adjustment of the mA and/or kV according to patient size and/or use of iterative reconstruction technique. COMPARISON:  Radiographs 12/02/2023. FINDINGS: Bones/Joint/Cartilage The bones are diffusely demineralized. There is a trimalleolar fracture associated with widening of the ankle mortise and lateral subluxation of the talus relative to the tibial plafond. Transverse fracture through the base of the medial malleolus is mildly comminuted and displaced up to 9 mm laterally. Posterior malleolar fracture is also mildly comminuted and mildly displaced proximally and laterally. Intra-articular fracture of the anterolateral aspect of the distal tibia at the tibiofibular joint demonstrates up to 10 mm of anterolateral displacement. There is a nondisplaced oblique fracture of the distal fibular metaphysis. All of these fractures appear acute or subacute. No definite fracture healing. No definite signs of superimposed osteomyelitis. No evidence of tarsal bone fracture  or dislocation. Small ankle joint effusion with small intra-articular fracture fragments. Ligaments Suboptimally assessed by CT. Muscles and Tendons As evaluated by CT, the ankle tendons appear intact without significant tenosynovitis. The posterior tibialis and flexor digitorum longus tendons are in close proximity to the comminuted fractures of the posteromedial distal tibia. No focal muscular abnormalities are identified. Soft tissues Moderate soft tissue swelling surrounding the ankle with extension into the dorsal aspect of the foot. No evidence of focal fluid collection, foreign body or soft tissue emphysema. Scattered arterial calcifications are noted. IMPRESSION: 1. Acute or subacute trimalleolar fracture of the left ankle as described with associated widening of the ankle mortise and lateral subluxation of the talus relative to the tibial plafond. 2. No definite signs of superimposed osteomyelitis. Correlate clinically. 3. Moderate soft tissue swelling surrounding the ankle with extension into the dorsal aspect of the foot. No evidence of focal fluid collection, foreign body or soft tissue emphysema. Electronically Signed   By: Carey Bullocks M.D.   On: 12/04/2023 07:29   DG Ankle  Complete Left Result Date: 12/02/2023 CLINICAL DATA:  Known ankle fracture EXAM: LEFT ANKLE COMPLETE - 3 VIEW COMPARISON:  Earlier same day tibia and fibula radiographs FINDINGS: Again seen are displaced medial malleolar and posterior distal tibial fractures with disruption of the ankle mortise. There is 7 mm medial displacement of the medial malleolar fracture fragment associated with widening of the medial ankle mortise. The posterior distal tibial fracture fragment is distracted by approximately 3 mm. Again seen is amorphous radiodensity projecting along the medial malleolar fracture. A subtle transversely oriented lucency is seen through the distal fibular diaphysis. Diffuse soft tissue swelling about the ankle.  IMPRESSION: 1. Displaced medial malleolar and posterior distal tibial fractures with disruption of the ankle mortise. Amorphous radiodensities are again seen, suggestive of interval healing, however osteomyelitis remains within the differential. 2. Subtle transversely oriented lucency through the distal fibular diaphysis may reflect a nondisplaced fracture. Electronically Signed   By: Agustin Cree M.D.   On: 12/02/2023 16:49   DG Tibia/Fibula Left Result Date: 12/02/2023 CLINICAL DATA:  Cellulitis and bruising EXAM: LEFT TIBIA AND FIBULA - 2 VIEW COMPARISON:  None Available. FINDINGS: Oblique fracture of the proximal fibular diaphysis with 1/2 shaft width anterior displacement. Findings of bony bridging. Displaced medial malleolar and posterior distal tibial fracture with disruption of the ankle mortise. Irregular appearance of the fracture lucency with amorphous calcification along the medial malleolus. Diffuse osteopenia. Diffuse soft tissue reticulations of the leg. IMPRESSION: 1. Displaced medial malleolar and posterior distal tibial fracture with disruption of the ankle mortise. Irregular appearance of the fracture lucency with amorphous calcification along the medial malleolus, which may be related to healing changes, however osteomyelitis is not excluded. 2. Oblique fracture of the proximal fibular diaphysis with 1/2 shaft width anterior displacement. Findings of bony bridging, suggestive of subacute fracture. 3. Diffuse soft tissue reticulations of the leg, which may be seen in the setting of cellulitis. Electronically Signed   By: Agustin Cree M.D.   On: 12/02/2023 14:45   US Venous Img Lower  Left (DVT Study) Result Date: 12/02/2023 CLINICAL DATA:  Swelling EXAM: LEFT LOWER EXTREMITY VENOUS DOPPLER ULTRASOUND TECHNIQUE: Gray-scale sonography with compression, as well as color and duplex ultrasound, were performed to evaluate the deep venous system(s) from the level of the common femoral vein through the  popliteal and proximal calf veins. COMPARISON:  None available FINDINGS: VENOUS Normal compressibility of the common femoral, superficial femoral, and popliteal veins, as well as the visualized calf veins. Visualized portions of profunda femoral vein and great saphenous vein unremarkable. No filling defects to suggest DVT on grayscale or color Doppler imaging. Doppler waveforms show normal direction of venous flow, normal respiratory plasticity and response to augmentation. Limited views of the contralateral common femoral vein are unremarkable. OTHER None. Limitations: none IMPRESSION: No DVT of the left lower extremity. Electronically Signed   By: Acquanetta Belling M.D.   On: 12/02/2023 14:02    Microbiology: Results for orders placed or performed during the hospital encounter of 12/02/23  Culture, blood (Routine X 2) w Reflex to ID Panel     Status: None   Collection Time: 12/02/23  7:38 PM   Specimen: BLOOD  Result Value Ref Range Status   Specimen Description BLOOD BLOOD LEFT HAND  Final   Special Requests   Final    BOTTLES DRAWN AEROBIC AND ANAEROBIC Blood Culture results may not be optimal due to an excessive volume of blood received in culture bottles   Culture   Final  NO GROWTH 5 DAYS Performed at [facility], [address] [place], [place] [address]    Report Status 12/07/2023 FINAL  Final  Culture, blood (Routine X 2) w Reflex to ID Panel     Status: None   Collection Time: 12/03/23  8:58 AM   Specimen: BLOOD  Result Value Ref Range Status   Specimen Description BLOOD RAC  Final   Special Requests   Final    BOTTLES DRAWN AEROBIC AND ANAEROBIC Blood Culture adequate volume   Culture   Final    NO GROWTH 5 DAYS Performed at [facility], [address] [place], [place] [address]    Report Status 12/08/2023 FINAL  Final    Labs: CBC: Recent Labs  Lab 12/02/23 1938 12/03/23 0542 12/07/23 0543 12/09/23 0519  WBC 7.7 6.6 6.3 6.8  NEUTROABS  --   --    --  5.4  HGB 8.8* 8.2* 8.8* 9.7*  HCT 26.6* 24.8* 26.1* 29.1*  MCV 93.0 93.6 91.9 93.0  PLT 231 194 178 182   Basic Metabolic Panel: Recent Labs  Lab 12/02/23 1938 12/03/23 0542 12/03/23 0858 12/09/23 0519  NA  --  137  --  133*  K  --  3.4*  --  3.3*  CL  --  104  --  100  CO2  --  23  --  24  GLUCOSE  --  140*  --  115*  BUN  --  14  --  7*  CREATININE 0.74 0.72  --  0.67  CALCIUM  --  8.0*  --  8.1*  MG  --   --  1.4* 1.6*   Liver Function Tests: Recent Labs  Lab 12/03/23 0542 12/09/23 0519  AST 12* 11*  ALT 10 9  ALKPHOS 67 72  BILITOT 1.4* 0.9  PROT 6.0* 6.3*  ALBUMIN 2.9* 2.9*   CBG: Recent Labs  Lab 12/08/23 1202 12/08/23 1217 12/08/23 1706 12/08/23 2107 12/09/23 0803  GLUCAP 168* 171* 159* 106* 117*    Discharge time spent: greater than 30 minutes.  Signed: Marinda Elk, MD [facility] 12/09/2023

## 2023-12-09 NOTE — TOC Progression Note (Signed)
Transition of Care [facility]) - Progression Note    Patient Details  Name: Sandra Kaufman MRN: [id number] Date of Birth: [birth year]  [facility] CM/SW Contact  Marlowe Sax, RN Phone Number: 12/09/2023, 8:51 AM  Clinical Narrative:     Philip Aspen called and stated that he went to [facility] care and toured and accepted the bed, he will go over today to do the payment, Patient to DC to [facility] today       Expected Discharge Plan and Services                                               Social Determinants of Health (SDOH) Interventions SDOH Screenings   Tobacco Use: Low Risk  (12/02/2023)    Readmission Risk Interventions     No data to display

## 2023-12-09 NOTE — Assessment & Plan Note (Deleted)
No clinical signs of osteomyelitis. CT imaging revealed no evidence of superimposed osteomyelitis Podiatry recommending continuing local wound care with: Xerform over wounds and ace wrap over the left foot and lower leg per podiatry recommendations.  Completing course of antibiotic therapy, currently on oral Augmentin for suspected superficial cellulitis.

## 2023-12-09 NOTE — Progress Notes (Signed)
Report called to Crystal LPN at facility. AVS, signed oxycodone script, and insulin sliding scale orders printed and in packet.

## 2023-12-09 NOTE — Assessment & Plan Note (Deleted)
Persisting Replacing with potassium chloride Evaluating for concurrent hypomagnesemia  Monitoring potassium levels with serial chemistries.

## 2023-12-09 NOTE — Assessment & Plan Note (Deleted)
See assessment and plan above

## 2023-12-09 NOTE — TOC Progression Note (Signed)
Transition of Care [facility]) - Progression Note    Patient Details  Name: Sandra Kaufman MRN: [id number] Date of Birth: 08-09-1932  [facility] Care [facility]) CM/SW Contact  Marlowe Sax, RN Phone Number: 12/09/2023, 3:45 PM  Clinical Narrative:     Going to [facility] called to arrange transport Son [name] called to make aware        Expected Discharge Plan and Services         Expected Discharge Date: 12/09/23                                     Social Determinants of Health (SDOH) Interventions SDOH Screenings   Tobacco Use: Low Risk  (12/02/2023)    Readmission Risk Interventions     No data to display

## 2023-12-09 NOTE — Assessment & Plan Note (Deleted)
Persisting  Replacing with intravenous magnesium sulfate Monitoring magnesium levels with serial chemistries.

## 2023-12-09 NOTE — Assessment & Plan Note (Deleted)
Blood pressure has been stable, at the time of discharge will continue with metoprolol and amlodipine.

## 2023-12-09 NOTE — Assessment & Plan Note (Deleted)
No signs of acute exacerbation, continue with bronchodilator therapy.

## 2023-12-28 ENCOUNTER — Ambulatory Visit: Payer: Medicare Other | Admitting: Podiatry

## 2024-01-11 ENCOUNTER — Ambulatory Visit: Payer: Medicare Other | Admitting: Podiatry

## 2024-03-28 DEATH — deceased
